# Patient Record
Sex: Female | Born: 1950 | Race: White | Hispanic: No | Marital: Married | Smoking: Never smoker
Health system: Southern US, Community
[De-identification: ages and names within clinical notes are randomized; demographics above are authoritative.]

## PROBLEM LIST (undated history)

## (undated) DIAGNOSIS — K219 Gastro-esophageal reflux disease without esophagitis: Secondary | ICD-10-CM

## (undated) DIAGNOSIS — D126 Benign neoplasm of colon, unspecified: Secondary | ICD-10-CM

## (undated) DIAGNOSIS — M6530 Trigger finger, unspecified finger: Secondary | ICD-10-CM

## (undated) DIAGNOSIS — C801 Malignant (primary) neoplasm, unspecified: Secondary | ICD-10-CM

## (undated) DIAGNOSIS — M1990 Unspecified osteoarthritis, unspecified site: Secondary | ICD-10-CM

## (undated) DIAGNOSIS — Z9221 Personal history of antineoplastic chemotherapy: Secondary | ICD-10-CM

## (undated) DIAGNOSIS — Z923 Personal history of irradiation: Secondary | ICD-10-CM

## (undated) DIAGNOSIS — I10 Essential (primary) hypertension: Secondary | ICD-10-CM

## (undated) DIAGNOSIS — C55 Malignant neoplasm of uterus, part unspecified: Secondary | ICD-10-CM

## (undated) DIAGNOSIS — R42 Dizziness and giddiness: Secondary | ICD-10-CM

## (undated) DIAGNOSIS — D649 Anemia, unspecified: Secondary | ICD-10-CM

## (undated) DIAGNOSIS — Z1379 Encounter for other screening for genetic and chromosomal anomalies: Secondary | ICD-10-CM

## (undated) DIAGNOSIS — C50919 Malignant neoplasm of unspecified site of unspecified female breast: Secondary | ICD-10-CM

## (undated) DIAGNOSIS — N3281 Overactive bladder: Secondary | ICD-10-CM

## (undated) DIAGNOSIS — E119 Type 2 diabetes mellitus without complications: Secondary | ICD-10-CM

## (undated) DIAGNOSIS — Z8739 Personal history of other diseases of the musculoskeletal system and connective tissue: Secondary | ICD-10-CM

## (undated) DIAGNOSIS — G4730 Sleep apnea, unspecified: Secondary | ICD-10-CM

## (undated) DIAGNOSIS — Z8719 Personal history of other diseases of the digestive system: Secondary | ICD-10-CM

## (undated) HISTORY — DX: Encounter for other screening for genetic and chromosomal anomalies: Z13.79

## (undated) HISTORY — PX: HERNIA REPAIR: SHX51

## (undated) HISTORY — PX: PORT-A-CATH REMOVAL: SHX5289

## (undated) HISTORY — DX: Malignant neoplasm of unspecified site of unspecified female breast: C50.919

## (undated) HISTORY — DX: Malignant neoplasm of uterus, part unspecified: C55

## (undated) HISTORY — PX: ABDOMINAL HYSTERECTOMY: SHX81

---

## 2004-07-20 ENCOUNTER — Other Ambulatory Visit: Payer: Self-pay

## 2004-07-22 ENCOUNTER — Ambulatory Visit: Payer: Self-pay | Admitting: Obstetrics and Gynecology

## 2004-08-09 ENCOUNTER — Ambulatory Visit: Payer: Self-pay | Admitting: Obstetrics and Gynecology

## 2004-08-26 ENCOUNTER — Inpatient Hospital Stay: Payer: Self-pay | Admitting: Obstetrics and Gynecology

## 2004-09-14 ENCOUNTER — Ambulatory Visit: Payer: Self-pay | Admitting: Gynecologic Oncology

## 2004-09-26 ENCOUNTER — Ambulatory Visit: Payer: Self-pay | Admitting: Family Medicine

## 2004-10-09 ENCOUNTER — Ambulatory Visit: Payer: Self-pay | Admitting: Gynecologic Oncology

## 2004-11-09 ENCOUNTER — Ambulatory Visit: Payer: Self-pay | Admitting: Gynecologic Oncology

## 2004-12-07 ENCOUNTER — Ambulatory Visit: Payer: Self-pay | Admitting: Gynecologic Oncology

## 2005-03-02 ENCOUNTER — Ambulatory Visit: Payer: Self-pay | Admitting: Gastroenterology

## 2005-11-20 ENCOUNTER — Ambulatory Visit: Payer: Self-pay | Admitting: Internal Medicine

## 2005-12-07 ENCOUNTER — Ambulatory Visit: Payer: Self-pay | Admitting: Radiation Oncology

## 2006-06-10 ENCOUNTER — Ambulatory Visit: Payer: Self-pay | Admitting: Internal Medicine

## 2006-09-27 ENCOUNTER — Ambulatory Visit: Payer: Self-pay | Admitting: Obstetrics and Gynecology

## 2006-11-21 ENCOUNTER — Ambulatory Visit: Payer: Self-pay | Admitting: Obstetrics and Gynecology

## 2006-12-13 ENCOUNTER — Ambulatory Visit: Payer: Self-pay | Admitting: Radiation Oncology

## 2007-01-08 ENCOUNTER — Ambulatory Visit: Payer: Self-pay | Admitting: Radiation Oncology

## 2007-05-31 ENCOUNTER — Ambulatory Visit: Payer: Self-pay | Admitting: Surgery

## 2007-05-31 ENCOUNTER — Other Ambulatory Visit: Payer: Self-pay

## 2007-06-04 ENCOUNTER — Inpatient Hospital Stay: Payer: Self-pay | Admitting: Surgery

## 2007-12-08 ENCOUNTER — Ambulatory Visit: Payer: Self-pay | Admitting: Radiation Oncology

## 2007-12-19 ENCOUNTER — Ambulatory Visit: Payer: Self-pay | Admitting: Radiation Oncology

## 2007-12-24 ENCOUNTER — Ambulatory Visit: Payer: Self-pay | Admitting: Obstetrics and Gynecology

## 2008-01-08 ENCOUNTER — Ambulatory Visit: Payer: Self-pay | Admitting: Radiation Oncology

## 2008-12-07 ENCOUNTER — Ambulatory Visit: Payer: Self-pay | Admitting: Radiation Oncology

## 2008-12-17 ENCOUNTER — Ambulatory Visit: Payer: Self-pay | Admitting: Radiation Oncology

## 2008-12-29 ENCOUNTER — Ambulatory Visit: Payer: Self-pay | Admitting: Obstetrics and Gynecology

## 2009-01-07 ENCOUNTER — Ambulatory Visit: Payer: Self-pay | Admitting: Radiation Oncology

## 2010-02-02 ENCOUNTER — Ambulatory Visit: Payer: Self-pay | Admitting: Obstetrics and Gynecology

## 2010-04-19 ENCOUNTER — Ambulatory Visit: Payer: Self-pay | Admitting: Gastroenterology

## 2011-02-23 ENCOUNTER — Ambulatory Visit: Payer: Self-pay | Admitting: Obstetrics and Gynecology

## 2012-03-06 ENCOUNTER — Ambulatory Visit: Payer: Self-pay | Admitting: Obstetrics and Gynecology

## 2012-03-14 ENCOUNTER — Ambulatory Visit: Payer: Self-pay | Admitting: Obstetrics and Gynecology

## 2012-04-02 ENCOUNTER — Ambulatory Visit: Payer: Self-pay | Admitting: Surgery

## 2012-10-14 ENCOUNTER — Ambulatory Visit: Payer: Self-pay | Admitting: Surgery

## 2013-03-10 ENCOUNTER — Ambulatory Visit: Payer: Self-pay | Admitting: Surgery

## 2013-09-24 ENCOUNTER — Ambulatory Visit: Payer: Self-pay | Admitting: Surgery

## 2013-10-09 HISTORY — PX: BREAST EXCISIONAL BIOPSY: SUR124

## 2014-02-11 DIAGNOSIS — J309 Allergic rhinitis, unspecified: Secondary | ICD-10-CM | POA: Insufficient documentation

## 2014-03-17 ENCOUNTER — Ambulatory Visit: Payer: Self-pay | Admitting: Surgery

## 2014-04-02 ENCOUNTER — Ambulatory Visit: Payer: Self-pay | Admitting: Surgery

## 2014-04-08 HISTORY — PX: BREAST BIOPSY: SHX20

## 2014-04-09 ENCOUNTER — Ambulatory Visit: Payer: Self-pay | Admitting: Surgery

## 2014-04-14 LAB — PATHOLOGY REPORT

## 2014-05-25 DIAGNOSIS — I10 Essential (primary) hypertension: Secondary | ICD-10-CM | POA: Insufficient documentation

## 2014-10-14 ENCOUNTER — Ambulatory Visit: Payer: Self-pay | Admitting: Surgery

## 2015-01-30 NOTE — Op Note (Signed)
PATIENT NAME:  Dominique Guzman, Dominique Guzman MR#:  [id number] DATE OF BIRTH:  December 03, 1950  DATE OF PROCEDURE:  04/09/2014  PREOPERATIVE DIAGNOSIS: Left breast mass.   POSTOPERATIVE DIAGNOSIS: Left breast mass.   PROCEDURE: Excision of left breast mass.   SURGEON: Rochel Brome, M.D.   ANESTHESIA: General.   INDICATIONS: This 64 year old female has had a mammogram depicting a faint irregular mass deep within this upper central aspect of the left breast, was not seen on ultrasound. We elected to do Kopans wire localization and excision. She did have the insertion of Kopans wire this morning. I reviewed mammogram images depicting the location of the wire with the barb inserted into the mass.   PROCEDURE IN DETAIL: The patient was placed on the operating table in the supine position under general anesthesia. The left arm was placed on a left lateral arm support. The dressing was removed from the upper aspect of the left breast exposing the Kopans wire, which was cut 2 cm from the skin. The breast was prepared with ChloraPrep and draped in a sterile manner.   An incision was made midway between the entrance point of the wire and the nipple, and the incision was transversely oriented curvilinear extending from approximately 11:00 to 1:00 position, and carried down through subcutaneous tissues down deep within the breast and encountered the wire. The location of the thickener was identified and dissection was carried down deeply within the left breast, down as far as the deep fascia, which was approximately the 12:00 position of the breast and also mostly centrally located. There was no grossly palpable hard mass in the area, but it appeared that this was soft tissue that had the fatty appearance. A minimal amount of glandular tissue involved, and this portion of tissue surrounding the wire from the mid aspect of the thickener down beyond the barb was excised, removing a portion of tissue, which was approximately 1.5  x 1.5 x 4 cm in length, and submitted for specimen mammogram and routine pathology. The wound was palpated.  There was no visible or palpable mass within the wound in the surrounding area. Several small bleeding points were cauterized. Hemostasis was intact. Next, the wound was closed with a running 4-0 Monocryl subcuticular suture and Dermabond. The patient was carried to the Operating Room for postoperative care.     ____________________________ J. Rochel Brome, MD jws:ts D: 04/09/2014 12:29:45 ET T: 04/09/2014 18:57:00 ET JOB#: [id number]  cc: Loreli Dollar, MD, <Dictator> Loreli Dollar MD ELECTRONICALLY SIGNED 04/20/2014 13:01

## 2015-03-30 ENCOUNTER — Other Ambulatory Visit: Payer: Self-pay | Admitting: Obstetrics and Gynecology

## 2015-03-30 DIAGNOSIS — Z1231 Encounter for screening mammogram for malignant neoplasm of breast: Secondary | ICD-10-CM

## 2015-04-05 ENCOUNTER — Ambulatory Visit
Admission: RE | Admit: 2015-04-05 | Discharge: 2015-04-05 | Disposition: A | Discharge status: Home / Self Care | Payer: BLUE CROSS/BLUE SHIELD | Source: Ambulatory Visit | Attending: Obstetrics and Gynecology | Admitting: Obstetrics and Gynecology

## 2015-04-05 DIAGNOSIS — Z1231 Encounter for screening mammogram for malignant neoplasm of breast: Secondary | ICD-10-CM | POA: Insufficient documentation

## 2015-04-14 ENCOUNTER — Encounter: Payer: Self-pay | Admitting: *Deleted

## 2015-04-15 ENCOUNTER — Encounter
Admission: RE | Disposition: A | Discharge status: Home / Self Care | Payer: Self-pay | Source: Ambulatory Visit | Attending: Gastroenterology

## 2015-04-15 ENCOUNTER — Ambulatory Visit: Payer: BLUE CROSS/BLUE SHIELD | Admitting: Anesthesiology

## 2015-04-15 ENCOUNTER — Ambulatory Visit
Admission: RE | Admit: 2015-04-15 | Discharge: 2015-04-15 | Disposition: A | Discharge status: Home / Self Care | Payer: BLUE CROSS/BLUE SHIELD | Source: Ambulatory Visit | Attending: Gastroenterology | Admitting: Gastroenterology

## 2015-04-15 DIAGNOSIS — G4730 Sleep apnea, unspecified: Secondary | ICD-10-CM | POA: Diagnosis not present

## 2015-04-15 DIAGNOSIS — Z1211 Encounter for screening for malignant neoplasm of colon: Secondary | ICD-10-CM | POA: Insufficient documentation

## 2015-04-15 DIAGNOSIS — E119 Type 2 diabetes mellitus without complications: Secondary | ICD-10-CM | POA: Insufficient documentation

## 2015-04-15 DIAGNOSIS — I252 Old myocardial infarction: Secondary | ICD-10-CM | POA: Insufficient documentation

## 2015-04-15 DIAGNOSIS — Z79899 Other long term (current) drug therapy: Secondary | ICD-10-CM | POA: Diagnosis not present

## 2015-04-15 DIAGNOSIS — I10 Essential (primary) hypertension: Secondary | ICD-10-CM | POA: Insufficient documentation

## 2015-04-15 DIAGNOSIS — Z86010 Personal history of colonic polyps: Secondary | ICD-10-CM | POA: Diagnosis not present

## 2015-04-15 HISTORY — DX: Essential (primary) hypertension: I10

## 2015-04-15 HISTORY — DX: Malignant (primary) neoplasm, unspecified: C80.1

## 2015-04-15 HISTORY — DX: Sleep apnea, unspecified: G47.30

## 2015-04-15 HISTORY — DX: Personal history of other diseases of the musculoskeletal system and connective tissue: Z87.39

## 2015-04-15 HISTORY — DX: Benign neoplasm of colon, unspecified: D12.6

## 2015-04-15 HISTORY — DX: Type 2 diabetes mellitus without complications: E11.9

## 2015-04-15 HISTORY — DX: Anemia, unspecified: D64.9

## 2015-04-15 HISTORY — PX: COLONOSCOPY WITH PROPOFOL: SHX5780

## 2015-04-15 LAB — GLUCOSE, CAPILLARY: Glucose-Capillary: 157 mg/dL — ABNORMAL HIGH (ref 65–99)

## 2015-04-15 SURGERY — COLONOSCOPY WITH PROPOFOL
Anesthesia: General

## 2015-04-15 MED ORDER — PROPOFOL INFUSION 10 MG/ML OPTIME
INTRAVENOUS | Status: DC | PRN
  Administered 2015-04-15: 120 ug/kg/min via INTRAVENOUS

## 2015-04-15 MED ORDER — LIDOCAINE HCL (CARDIAC) 20 MG/ML IV SOLN
INTRAVENOUS | Status: DC | PRN
  Administered 2015-04-15: 60 mg via INTRAVENOUS

## 2015-04-15 MED ORDER — SODIUM CHLORIDE 0.9 % IV SOLN
INTRAVENOUS | Status: DC
  Administered 2015-04-15: 11:00:00 via INTRAVENOUS
  Administered 2015-04-15: 1000 mL via INTRAVENOUS

## 2015-04-15 MED ORDER — PROPOFOL 10 MG/ML IV BOLUS
INTRAVENOUS | Status: DC | PRN
  Administered 2015-04-15: 40 mg via INTRAVENOUS

## 2015-04-15 NOTE — Anesthesia Postprocedure Evaluation (Signed)
  Anesthesia Post-op Note  Patient: Dominique Guzman  Procedure(s) Performed: Procedure(s): COLONOSCOPY WITH PROPOFOL (N/A)  Anesthesia type:General  Patient location: PACU  Post pain: Pain level controlled  Post assessment: Post-op Vital signs reviewed, Patient's Cardiovascular Status Stable, Respiratory Function Stable, Patent Airway and No signs of Nausea or vomiting  Post vital signs: Reviewed and stable  Last Vitals:  Filed Vitals:   04/15/15 1130  BP: 118/69  Pulse: 74  Temp:   Resp: 24    Level of consciousness: awake, alert  and patient cooperative  Complications: No apparent anesthesia complications

## 2015-04-15 NOTE — Op Note (Signed)
[facility] Gastroenterology Patient Name: Dominique Guzman Procedure Date: 04/15/2015 10:57 AM MRN: [id number] Account #: [id number] Date of Birth: [birth year] Admit Type: Outpatient Age: 64 Room: [facility] ENDO ROOM 4 Gender: Female Note Status: Finalized Procedure:         Colonoscopy Indications:       Personal history of colonic polyps Providers:         Lupita Dawn. Candace Cruise, MD Referring MD:      Glendon Axe (Referring MD) Medicines:         Monitored Anesthesia Care Complications:     No immediate complications. Procedure:         Pre-Anesthesia Assessment:                    - Prior to the procedure, a History and Physical was                     performed, and patient medications, allergies and                     sensitivities were reviewed. The patient's tolerance of                     previous anesthesia was reviewed.                    - The risks and benefits of the procedure and the sedation                     options and risks were discussed with the patient. All                     questions were answered and informed consent was obtained.                    - After reviewing the risks and benefits, the patient was                     deemed in satisfactory condition to undergo the procedure.                    After obtaining informed consent, the colonoscope was                     passed under direct vision. Throughout the procedure, the                     patient's blood pressure, pulse, and oxygen saturations                     were monitored continuously. The Colonoscope was                     introduced through the anus and advanced to the the cecum,                     identified by appendiceal orifice and ileocecal valve. The                     colonoscopy was performed without difficulty. The patient                     tolerated the procedure well. The quality of the bowel  preparation was good. Findings:      The colon  (entire examined portion) appeared normal. Impression:        - The entire examined colon is normal.                    - No specimens collected. Recommendation:    - Discharge patient to home.                    - Repeat colonoscopy in 5 years for surveillance.                    - The findings and recommendations were discussed with the                     patient. Procedure Code(s): --- Professional ---                    [phone], Colonoscopy, flexible; diagnostic, including                     collection of specimen(s) by brushing or washing, when                     performed (separate procedure) Diagnosis Code(s): --- Professional ---                    Z86.010, Personal history of colonic polyps CPT copyright 2014 [employer]. All rights reserved. The codes documented in this report are preliminary and upon coder review may  be revised to meet current compliance requirements. Hulen Luster, MD 04/15/2015 11:13:27 AM This report has been signed electronically. Number of Addenda: 0 Note Initiated On: 04/15/2015 10:57 AM Scope Withdrawal Time: 0 hours 5 minutes 43 seconds  Total Procedure Duration: 0 hours 9 minutes 58 seconds       [facility]

## 2015-04-15 NOTE — Transfer of Care (Signed)
Immediate Anesthesia Transfer of Care Note  Patient: Dominique Guzman  Procedure(s) Performed: Procedure(s): COLONOSCOPY WITH PROPOFOL (N/A)  Patient Location: Endoscopy Unit  Anesthesia Type:General  Level of Consciousness: awake, alert , oriented and patient cooperative  Airway & Oxygen Therapy: Patient Spontanous Breathing and Patient connected to nasal cannula oxygen  Post-op Assessment: Report given to RN, Post -op Vital signs reviewed and stable and Patient moving all extremities X 4  Post vital signs: Reviewed and stable  Last Vitals:  Filed Vitals:   04/15/15 1116  BP: 119/70  Pulse: 73  Temp: 36.6 C  Resp: 21    Complications: No apparent anesthesia complications

## 2015-04-15 NOTE — H&P (Signed)
    Primary Care Physician:  Glendon Axe, MD Primary Gastroenterologist:  Dr. Candace Cruise  Pre-Procedure History & Physical: HPI:  Dominique Guzman is a 64 y.o. female is here for an colonoscopy.   Past Medical History  Diagnosis Date  . Diabetes mellitus without complication   . Hypertension   . Sleep apnea   . Anemia   . Benign neoplasm of colon   . H/O osteopenia   . Cancer     Past Surgical History  Procedure Laterality Date  . Breast biopsy Left 04/2014    negative  . Abdominal hysterectomy    . Cesarean section      Prior to Admission medications   Medication Sig Start Date End Date Taking? Authorizing Provider  Cinnamon Bark POWD 1 Dose by Does not apply route 1 day or 1 dose.   Yes Historical Provider, MD  FIBER, CORN DEXTRIN, PO Take 5 mg/day by mouth 1 day or 1 dose.   Yes Historical Provider, MD  fluticasone (FLONASE) 50 MCG/ACT nasal spray Place 2 sprays into both nostrils daily.   Yes Historical Provider, MD  glimepiride (AMARYL) 4 MG tablet Take 4 mg by mouth 2 (two) times daily.   Yes Historical Provider, MD  Glucosamine Sulfate 500 MG TABS Take 1,500 mg by mouth 1 day or 1 dose.   Yes Historical Provider, MD  lisinopril-hydrochlorothiazide (PRINZIDE,ZESTORETIC) 20-12.5 MG per tablet Take 1 tablet by mouth daily.   Yes Historical Provider, MD  meloxicam (MOBIC) 15 MG tablet Take 15 mg by mouth daily.   Yes Historical Provider, MD  metFORMIN (GLUCOPHAGE) 500 MG tablet Take by mouth 2 (two) times daily with a meal.   Yes Historical Provider, MD  Multiple Vitamin (MULTIVITAMIN) tablet Take 1 tablet by mouth daily.   Yes Historical Provider, MD  sitaGLIPtin (JANUVIA) 100 MG tablet Take 100 mg by mouth daily.   Yes Historical Provider, MD    Allergies as of 03/30/2015  . (Not on File)    History reviewed. No pertinent family history.  History   Social History  . Marital Status: Married    Spouse Name: N/A  . Number of Children: N/A  . Years of Education: N/A    Occupational History  . Not on file.   Social History Main Topics  . Smoking status: Never Smoker   . Smokeless tobacco: Not on file  . Alcohol Use: Not on file  . Drug Use: No  . Sexual Activity: Not on file   Other Topics Concern  . Not on file   Social History Narrative    Review of Systems: See HPI, otherwise negative ROS  Physical Exam: LMP  (LMP Unknown) General:   Alert,  pleasant and cooperative in NAD Head:  Normocephalic and atraumatic. Neck:  Supple; no masses or thyromegaly. Lungs:  Clear throughout to auscultation.    Heart:  Regular rate and rhythm. Abdomen:  Soft, nontender and nondistended. Normal bowel sounds, without guarding, and without rebound.   Neurologic:  Alert and  oriented x4;  grossly normal neurologically.  Impression/Plan: Dominique Guzman is here for an colonoscopy to be performed for personal hx of colon polyps  Risks, benefits, limitations, and alternatives regarding colonoscopy have been reviewed with the patient.  Questions have been answered.  All parties agreeable.   Laura Radilla, Lupita Dawn, MD  04/15/2015, 9:42 AM

## 2015-04-15 NOTE — Anesthesia Preprocedure Evaluation (Signed)
Anesthesia Evaluation  Patient identified by MRN, date of birth, ID band Patient awake    Reviewed: Allergy & Precautions, H&P , NPO status , Patient's Chart, lab work & pertinent test results, reviewed documented beta blocker date and time   Airway Mallampati: III  TM Distance: >3 FB Neck ROM: full    Dental   Pulmonary sleep apnea ,  breath sounds clear to auscultation  Pulmonary exam normal       Cardiovascular hypertension, - Past MI negative cardio ROS Normal cardiovascular examRhythm:regular Rate:Normal     Neuro/Psych negative neurological ROS  negative psych ROS   GI/Hepatic negative GI ROS, Neg liver ROS,   Endo/Other  negative endocrine ROSdiabetes  Renal/GU negative Renal ROS  negative genitourinary   Musculoskeletal   Abdominal   Peds  Hematology negative hematology ROS (+)   Anesthesia Other Findings   Reproductive/Obstetrics negative OB ROS                             Anesthesia Physical Anesthesia Plan  ASA: III  Anesthesia Plan: General   Post-op Pain Management:    Induction:   Airway Management Planned:   Additional Equipment:   Intra-op Plan:   Post-operative Plan:   Informed Consent: I have reviewed the patients History and Physical, chart, labs and discussed the procedure including the risks, benefits and alternatives for the proposed anesthesia with the patient or authorized representative who has indicated his/her understanding and acceptance.   Dental Advisory Given  Plan Discussed with: Anesthesiologist, CRNA and Surgeon  Anesthesia Plan Comments:         Anesthesia Quick Evaluation

## 2015-04-16 ENCOUNTER — Encounter: Payer: Self-pay | Admitting: Gastroenterology

## 2015-05-19 DIAGNOSIS — E119 Type 2 diabetes mellitus without complications: Secondary | ICD-10-CM | POA: Insufficient documentation

## 2015-11-18 DIAGNOSIS — I10 Essential (primary) hypertension: Secondary | ICD-10-CM | POA: Insufficient documentation

## 2015-11-18 DIAGNOSIS — E119 Type 2 diabetes mellitus without complications: Secondary | ICD-10-CM | POA: Insufficient documentation

## 2015-11-18 DIAGNOSIS — M545 Low back pain, unspecified: Secondary | ICD-10-CM | POA: Insufficient documentation

## 2015-11-18 DIAGNOSIS — G8929 Other chronic pain: Secondary | ICD-10-CM | POA: Insufficient documentation

## 2016-03-30 ENCOUNTER — Other Ambulatory Visit: Payer: Self-pay | Admitting: Obstetrics and Gynecology

## 2016-03-30 DIAGNOSIS — Z1231 Encounter for screening mammogram for malignant neoplasm of breast: Secondary | ICD-10-CM

## 2016-04-13 ENCOUNTER — Other Ambulatory Visit: Payer: Self-pay | Admitting: Obstetrics and Gynecology

## 2016-04-13 ENCOUNTER — Ambulatory Visit
Admission: RE | Admit: 2016-04-13 | Discharge: 2016-04-13 | Disposition: A | Discharge status: Home / Self Care | Payer: BLUE CROSS/BLUE SHIELD | Source: Ambulatory Visit | Attending: Obstetrics and Gynecology | Admitting: Obstetrics and Gynecology

## 2016-04-13 DIAGNOSIS — Z1231 Encounter for screening mammogram for malignant neoplasm of breast: Secondary | ICD-10-CM

## 2016-10-09 DIAGNOSIS — C50919 Malignant neoplasm of unspecified site of unspecified female breast: Secondary | ICD-10-CM

## 2016-10-09 HISTORY — PX: MASTECTOMY: SHX3

## 2016-10-09 HISTORY — DX: Malignant neoplasm of unspecified site of unspecified female breast: C50.919

## 2017-04-04 ENCOUNTER — Other Ambulatory Visit: Payer: Self-pay | Admitting: Obstetrics and Gynecology

## 2017-04-04 DIAGNOSIS — Z1231 Encounter for screening mammogram for malignant neoplasm of breast: Secondary | ICD-10-CM

## 2017-04-04 DIAGNOSIS — Z8542 Personal history of malignant neoplasm of other parts of uterus: Secondary | ICD-10-CM | POA: Insufficient documentation

## 2017-04-17 ENCOUNTER — Ambulatory Visit
Admission: RE | Admit: 2017-04-17 | Discharge: 2017-04-17 | Disposition: A | Discharge status: Home / Self Care | Payer: BLUE CROSS/BLUE SHIELD | Source: Ambulatory Visit | Attending: Obstetrics and Gynecology | Admitting: Obstetrics and Gynecology

## 2017-04-17 DIAGNOSIS — Z1231 Encounter for screening mammogram for malignant neoplasm of breast: Secondary | ICD-10-CM | POA: Insufficient documentation

## 2017-04-17 DIAGNOSIS — R928 Other abnormal and inconclusive findings on diagnostic imaging of breast: Secondary | ICD-10-CM | POA: Diagnosis not present

## 2017-04-20 ENCOUNTER — Other Ambulatory Visit: Payer: Self-pay | Admitting: Obstetrics and Gynecology

## 2017-04-20 DIAGNOSIS — R928 Other abnormal and inconclusive findings on diagnostic imaging of breast: Secondary | ICD-10-CM

## 2017-04-20 DIAGNOSIS — N6320 Unspecified lump in the left breast, unspecified quadrant: Secondary | ICD-10-CM

## 2017-04-25 ENCOUNTER — Other Ambulatory Visit: Payer: Self-pay | Admitting: Obstetrics and Gynecology

## 2017-04-25 ENCOUNTER — Ambulatory Visit
Admission: RE | Admit: 2017-04-25 | Discharge: 2017-04-25 | Disposition: A | Discharge status: Home / Self Care | Payer: BLUE CROSS/BLUE SHIELD | Source: Ambulatory Visit | Attending: Obstetrics and Gynecology | Admitting: Obstetrics and Gynecology

## 2017-04-25 DIAGNOSIS — C773 Secondary and unspecified malignant neoplasm of axilla and upper limb lymph nodes: Secondary | ICD-10-CM | POA: Diagnosis not present

## 2017-04-25 DIAGNOSIS — C50412 Malignant neoplasm of upper-outer quadrant of left female breast: Secondary | ICD-10-CM | POA: Insufficient documentation

## 2017-04-25 DIAGNOSIS — N6320 Unspecified lump in the left breast, unspecified quadrant: Secondary | ICD-10-CM

## 2017-04-25 DIAGNOSIS — R928 Other abnormal and inconclusive findings on diagnostic imaging of breast: Secondary | ICD-10-CM

## 2017-04-25 DIAGNOSIS — N6323 Unspecified lump in the left breast, lower outer quadrant: Secondary | ICD-10-CM | POA: Diagnosis present

## 2017-04-30 ENCOUNTER — Encounter: Payer: Self-pay | Admitting: *Deleted

## 2017-04-30 NOTE — Progress Notes (Signed)
  Oncology Nurse Navigator Documentation  Navigator Location: [facility] (04/30/17 1100)   )Navigator Encounter Type: Telephone (04/30/17 1100) Telephone: Outgoing Call (04/30/17 1100) Abnormal Finding Date: 04/25/17 (04/30/17 1100) Confirmed Diagnosis Date: 04/27/17 (04/30/17 1100)                   Barriers/Navigation Needs: Coordination of Care (04/30/17 1100)                              Called and left patient a message to return my call.  She is newly diagnosed with invasive breast and needs to be scheduled for surgical and medical oncology consultation.

## 2017-05-01 ENCOUNTER — Encounter: Payer: Self-pay | Admitting: *Deleted

## 2017-05-01 NOTE — Progress Notes (Signed)
  Oncology Nurse Navigator Documentation  Navigator Location: [facility] (05/01/17 1100)   )Navigator Encounter Type: Introductory phone call (05/01/17 1100) Telephone: [place] Call (05/01/17 1100)                       Barriers/Navigation Needs: Coordination of Care (05/01/17 1100)   Interventions: Coordination of Care (05/01/17 1100)   Coordination of Care: Appts (05/01/17 1100)                  Time Spent with Patient: 30 (05/01/17 1100)   Talked with patient today.  Introduced to navigation services.  States she has already been scheduled to see Dr. Tamala Julian on 05/08/17 @ 3:30.  Discussed need for medical oncology consult also.  States she wants to see Dr. Baruch Gouty, since she has seen in the past.  States she has a history of uterine cancer diagnosed in 2005.  States she had a total hysterectomy and radiation therapy at that time. Explained she would see him at a later date, since he is radiation oncology.  Appointment scheduled to see Dr. Tasia Catchings on Friday at 8:30.  Will give her her educational literature at that time.  She is to call with any questions or needs.

## 2017-05-04 ENCOUNTER — Encounter: Payer: Self-pay | Admitting: *Deleted

## 2017-05-04 ENCOUNTER — Inpatient Hospital Stay: Payer: BLUE CROSS/BLUE SHIELD

## 2017-05-04 ENCOUNTER — Inpatient Hospital Stay: Payer: BLUE CROSS/BLUE SHIELD | Attending: Oncology | Admitting: Oncology

## 2017-05-04 ENCOUNTER — Encounter: Payer: Self-pay | Admitting: Oncology

## 2017-05-04 VITALS — BP 137/87 | HR 80 | Temp 97.9°F | Wt 189.3 lb

## 2017-05-04 DIAGNOSIS — M8580 Other specified disorders of bone density and structure, unspecified site: Secondary | ICD-10-CM | POA: Insufficient documentation

## 2017-05-04 DIAGNOSIS — I10 Essential (primary) hypertension: Secondary | ICD-10-CM | POA: Insufficient documentation

## 2017-05-04 DIAGNOSIS — R590 Localized enlarged lymph nodes: Secondary | ICD-10-CM

## 2017-05-04 DIAGNOSIS — Z923 Personal history of irradiation: Secondary | ICD-10-CM | POA: Diagnosis not present

## 2017-05-04 DIAGNOSIS — C50512 Malignant neoplasm of lower-outer quadrant of left female breast: Secondary | ICD-10-CM | POA: Insufficient documentation

## 2017-05-04 DIAGNOSIS — C50412 Malignant neoplasm of upper-outer quadrant of left female breast: Secondary | ICD-10-CM

## 2017-05-04 DIAGNOSIS — Z8542 Personal history of malignant neoplasm of other parts of uterus: Secondary | ICD-10-CM | POA: Insufficient documentation

## 2017-05-04 DIAGNOSIS — E119 Type 2 diabetes mellitus without complications: Secondary | ICD-10-CM | POA: Diagnosis not present

## 2017-05-04 DIAGNOSIS — Z7984 Long term (current) use of oral hypoglycemic drugs: Secondary | ICD-10-CM

## 2017-05-04 DIAGNOSIS — Z170 Estrogen receptor positive status [ER+]: Secondary | ICD-10-CM | POA: Insufficient documentation

## 2017-05-04 DIAGNOSIS — Z79899 Other long term (current) drug therapy: Secondary | ICD-10-CM | POA: Insufficient documentation

## 2017-05-04 DIAGNOSIS — G4730 Sleep apnea, unspecified: Secondary | ICD-10-CM | POA: Diagnosis not present

## 2017-05-04 LAB — COMPREHENSIVE METABOLIC PANEL
ALBUMIN: 4 g/dL (ref 3.5–5.0)
ALT: 12 U/L — AB (ref 14–54)
AST: 20 U/L (ref 15–41)
Alkaline Phosphatase: 84 U/L (ref 38–126)
Anion gap: 9 (ref 5–15)
BUN: 19 mg/dL (ref 6–20)
CHLORIDE: 101 mmol/L (ref 101–111)
CO2: 27 mmol/L (ref 22–32)
CREATININE: 0.85 mg/dL (ref 0.44–1.00)
Calcium: 10 mg/dL (ref 8.9–10.3)
GFR calc Af Amer: >60 mL/min (ref >60)
Glucose, Bld: 132 mg/dL — ABNORMAL HIGH (ref 65–99)
POTASSIUM: 4.2 mmol/L (ref 3.5–5.1)
SODIUM: 137 mmol/L (ref 135–145)
Total Bilirubin: 0.5 mg/dL (ref 0.3–1.2)
Total Protein: 7.2 g/dL (ref 6.5–8.1)

## 2017-05-04 LAB — CBC WITH DIFFERENTIAL/PLATELET
BASOS ABS: 0 10*3/uL (ref 0–0.1)
BASOS PCT: 1 %
EOS ABS: 0.4 10*3/uL (ref 0–0.7)
EOS PCT: 4 %
HCT: 38.5 % (ref 35.0–47.0)
Hemoglobin: 13.6 g/dL (ref 12.0–16.0)
LYMPHS PCT: 17 %
Lymphs Abs: 1.7 10*3/uL (ref 1.0–3.6)
MCH: 28.4 pg (ref 26.0–34.0)
MCHC: 35.3 g/dL (ref 32.0–36.0)
MCV: 80.4 fL (ref 80.0–100.0)
MONO ABS: 0.8 10*3/uL (ref 0.2–0.9)
Monocytes Relative: 9 %
Neutro Abs: 6.8 10*3/uL — ABNORMAL HIGH (ref 1.4–6.5)
Neutrophils Relative %: 69 %
PLATELETS: 365 10*3/uL (ref 150–440)
RBC: 4.79 MIL/uL (ref 3.80–5.20)
RDW: 13.7 % (ref 11.5–14.5)
WBC: 9.7 10*3/uL (ref 3.6–11.0)

## 2017-05-04 NOTE — Progress Notes (Signed)
  Oncology Nurse Navigator Documentation  Navigator Location: [facility] (05/04/17 0900)   )Navigator Encounter Type: Initial MedOnc (05/04/17 0900)                     Patient Visit Type: MedOnc (05/04/17 0900) Treatment Phase: Pre-Tx/Tx Discussion (05/04/17 0900) Barriers/Navigation Needs: Education (05/04/17 0900) Education: Understanding Cancer/ Treatment Options;Coping with Diagnosis/ Prognosis;Newly Diagnosed Cancer Education (05/04/17 0900) Interventions: Education (05/04/17 0900)     Education Method: Written;Verbal (05/04/17 0900)  Support Groups/Services: [facility];Breast Support Group (05/04/17 0900)             Time Spent with Patient: 60 (05/04/17 0900)   Met with patient and her husband today during her initial medical oncology consult with Dr. Tasia Catchings.  Gave patient breast cancer educational literature, "My Breast Cancer Treatment Handbook" by Josephine Igo, RN.  Patient has been scheduled for an MRI, Echo and port a cath placement with Dr. Faythe Casa.  She is to call with any questions or needs.

## 2017-05-04 NOTE — Progress Notes (Signed)
[facility] Consultation Note  Patient Care Team: Glendon Axe, MD as PCP - General (Internal Medicine)  CHIEF COMPLAINTS/PURPOSE OF CONSULTATION: History of Uterine Cancer, treated with radiation.   HISTORY OF PRESENTING ILLNESS: Dominique Guzman 66 y.o. female with PMH listed as below is referred by Dr.Schermerhorn to [name] for evaluation and management of newly diagnosed breast cancer. Patient had a history a left breast mass biopsy followed by breast excisonal biopsy in 2015 with benign pathology.  She also has a history of Uterine cancer which was treated with RT with Dr.Chrystal.  She underwent routine screening mammogram on 04/17/2017 which recommended a possible mass on the left breast. Left diagnostic mammogram showed a highly suspicious left breast mass at 4 o'clock, 7 cm from the nipple and 1 o'clock,,  About 3 cm from the nipple. Enlarged left axillary lymph node suspicious for metastatic disease.  US breast confirmed a left breast mass of 2.1cm, 4 o'clock, and 1.1 cm mass at 1 o'clock.The masses at 1 and 4 o'clock are separated by approximately 4 cm sonographically Biopsy of the two mass and axillary lymph node revealed invasive carcinoma, grade 2. Both are ER,PR positive. HER2 equivocal, FISH pending. 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   Patient denies nipple discharge, pain, back pain or cough.   Review of Systems  Constitutional: Negative.   HENT:  Negative.   Eyes: Negative.   Respiratory: Negative.   Cardiovascular: Negative.   Gastrointestinal: Negative.   Endocrine: Negative.   Genitourinary: Negative.    Musculoskeletal: Negative.   Skin: Negative.     MEDICAL HISTORY: Past Medical History:  Diagnosis Date  . Anemia   . Benign neoplasm of colon   . Cancer ([place])   . Diabetes mellitus without complication ([place])   . H/O osteopenia   . Hypertension   . Sleep apnea   . Uterine cancer [facility])     SURGICAL HISTORY: Past  Surgical History:  Procedure Laterality Date  . ABDOMINAL HYSTERECTOMY    . BREAST BIOPSY Left 04/2014   negative  . BREAST EXCISIONAL BIOPSY Left 2015  . CESAREAN SECTION    . COLONOSCOPY WITH PROPOFOL N/A 04/15/2015   Procedure: COLONOSCOPY WITH PROPOFOL;  Surgeon: Hulen Luster, MD;  [facility] END[facility]COPY;  Service: Gastroenterology;  Laterality: N/A;    SOCIAL HISTORY: Social History   Social History  . Marital status: Married    Spouse name: N/A  . Number of children: N/A  . Years of education: N/A   Occupational History  . Not on file.   Social History Main Topics  . Smoking status: Never Smoker  . Smokeless tobacco: Never Used  . Alcohol use Not on file  . Drug use: No  . Sexual activity: Not on file   Other Topics Concern  . Not on file   Social History Narrative  . No narrative on file    FAMILY HISTORY Family History  Problem Relation Age of Onset  . Breast cancer Neg Hx     ALLERGIES:  has No Known Allergies.  MEDICATIONS:  Current Outpatient Prescriptions  Medication Sig Dispense Refill  . Cinnamon Bark POWD 1 Dose by Does not apply route 1 day or 1 dose.    [name] FIBER, CORN DEXTRIN, PO Take 5 mg/day by mouth 1 day or 1 dose.    . fluticasone (FLONASE) 50 MCG/ACT nasal spray Place 2 sprays into both nostrils daily.    [name] glimepiride (AMARYL) 4 MG  tablet Take 4 mg by mouth 2 (two) times daily.    . Glucosamine Sulfate 500 MG TABS Take 1,500 mg by mouth 1 day or 1 dose.    [name] lisinopril-hydrochlorothiazide (PRINZIDE,ZESTORETIC) 20-12.5 MG per tablet Take 1 tablet by mouth daily.    . meloxicam (MOBIC) 15 MG tablet Take 15 mg by mouth daily.    . metFORMIN (GLUCOPHAGE) 500 MG tablet Take by mouth 2 (two) times daily with a meal.    . Multiple Vitamin (MULTIVITAMIN) tablet Take 1 tablet by mouth daily.    . sitaGLIPtin (JANUVIA) 100 MG tablet Take 100 mg by mouth daily.     No current facility-administered medications for this visit.     PHYSICAL  EXAMINATION:  ECOG PERFORMANCE STATUS: 0 - Asymptomatic  Vitals:   05/04/17 0843  BP: 137/87  Pulse: 80  Temp: 97.9 F (36.6 C)    Filed Weights   05/04/17 0843  Weight: 189 lb 4.8 oz (85.9 kg)     Physical Exam GENERAL: No distress, well nourished.  SKIN:  No rashes or significant lesions  HEAD: Normocephalic, No masses, lesions, tenderness or abnormalities  EYES: Conjunctiva are pink, non icteric ENT: External ears normal ,lips , buccal mucosa, and tongue normal and mucous membranes are moist  LYMPH: No palpable cervical and axillary lymphadenopathy  LUNGS: Clear to auscultation, no crackles or wheezes HEART: Regular rate & rhythm, no murmurs, no gallops, S1 normal and S2 normal  ABDOMEN: Abdomen soft, non-tender, normal bowel sounds, I did not appreciate any  masses or organomegaly  MUSCULOSKELETAL: No CVA tenderness and no tenderness on percussion of the back or rib cage.  EXTREMITIES: No edema, no skin discoloration or tenderness NEURO: Alert & oriented, no focal motor/sensory deficits. Breast exam was performed in lying down position. No evidence of any palpable masses or lumps in the right breast. No evidence of right axillary adenopathy. Left breast palpable axillary lymph node. Left breast outer quadrant mass not discretely palpable.   LABORATORY DATA: No recent labs.    RADIOGRAPHIC STUDIES: I have personally reviewed the radiological images as listed and agree with the findings in the report 04/25/2017 Mammogram Diagnostic Targeted ultrasound of the left breast demonstrates an irregular, hypoechoic mass at 1 o'clock, 3 cm from the nipple measuring 10 x 10 x 10 mm corresponding to the mass seen within the anterior upper, outer left breast. At 4 o'clock, 7 cm from the nipple, there is an irregular hypoechoic mass measuring 2.1 x 1.5 x 1.9 cm. Targeted ultrasound of the left axilla demonstrates an enlarged, abnormal axillary lymph node demonstrating cortical  thickening up to 6 mm. The masses at 1 and 4 o'clock are separated by approximately 4 cm sonographically. IMPRESSION: Highly suspicious left breast masses at 4 o'clock, 7 cm from the nipple and 1 o'clock, 3 cm from the nipple. Enlarged left axillary lymph node suspicious for metastatic disease.  US breast 04/25/2017 FINDINGS: There is an irregular spiculated mass in the slightly lower, outer left breast, posterior depth measuring approximately 2.6 x 2.5 x 1.9 cm. There is an additional irregular mass within the upper, outer left breast, anterior depth measuring approximately 1 cm in size, which is suspicious for a satellite lesion. There is asymmetric density between the 2 masses suggesting contiguous spread of disease. Mammographically, the masses are separated by approximately 6 cm and in total span approximately 9.6 cm.  Mammographic images were processed with CAD.  Targeted ultrasound of the left breast demonstrates an irregular, hypoechoic mass  at 1 o'clock, 3 cm from the nipple measuring 10 x 10 x 10 mm corresponding to the mass seen within the anterior upper, outer left breast. At 4 o'clock, 7 cm from the nipple, there is an irregular hypoechoic mass measuring 2.1 x 1.5 x 1.9 cm. Targeted ultrasound of the left axilla demonstrates an enlarged, abnormal axillary lymph node demonstrating cortical thickening up to 6 mm. The masses at 1 and 4 o'clock are separated by approximately 4 cm sonographically. IMPRESSION: Highly suspicious left breast masses at 4 o'clock, 7 cm from the nipple and 1 o'clock, 3 cm from the nipple. Enlarged left axillary lymph node suspicious for metastatic disease.  Pathology DIAGNOSIS:  A. BREAST, LEFT, 4 O'CLOCK 7 CM FROM NIPPLE; ULTRASOUND-GUIDED CORE BIOPSY:  - INVASIVE MAMMARY CARCINOMA, NO SPECIAL TYPE, GRADE 2, SEE COMMENT.  - INVASIVE CARCINOMA MEASURES 15 MM IN THIS SAMPLE.  B. BREAST, LEFT, 1 O'CLOCK, 3 CM FROM NIPPLE; ULTRASOUND-GUIDED  CORE  BIOPSY:  - INVASIVE MAMMARY CARCINOMA, NO SPECIAL TYPE, GRADE 2, SEE COMMENT.  - INVASIVE CARCINOMA MEASURES 10 MM IN THIS SAMPLE.  C. LYMPH NODE, LEFT AXILLA; ULTRASOUND-GUIDED CORE BIOPSY:  - METASTATIC CARCINOMA, 7 MM METASTASIS, SEE COMMENT.    BREAST BIOMARKER TEST RESULTS - TUMOR A, 4 O'CLOCK:  Estrogen Receptor (ER) Status: POSITIVE, >90% of cells with nuclear  positivity     Average intensity of staining: Strong  Progesterone Receptor (PR) Status: POSITIVE, >90% of cells with nuclear  positivity     Average intensity of staining: Strong  HER2 (by immunohistochemistry): Equivocal (score 2+)  Percentage of cells with uniform intense complete membrane staining: 1%   BREAST BIOMARKER TEST RESULTS - TUMOR B, 1 O'CLOCK:  Estrogen Receptor (ER) Status: POSITIVE, >90% of cells with nuclear  positivity     Average intensity of staining: Strong  Progesterone Receptor (PR) Status: POSITIVE, 51-90% of cells with  nuclear positivity     Average intensity of staining: Moderate to strong  HER2 (by immunohistochemistry): Equivocal (score 2+)  Percentage of cells with uniform intense complete membrane staining: 2%    ASSESSMENT/PLAN 1. Malignant neoplasm of upper-outer quadrant of left breast in female, estrogen receptor positive ([place])   2. Malignant neoplasm of lower-outer quadrant of left breast of female, estrogen receptor positive ([place])   3. History of uterine cancer    # Multifocal left breast cancer,cT2N1M0, one of the mass is associated with LVI and DCIS. As multifocal cancer is associated mor ewith mammography occult cancer, will order MRI breast to further characterize.   # Awaiting HER2 FISH results. If negative, plan neoadjuvant chemotherapy with AC--T which has higher rate of pCR. Hopefully to convert patient's lymph node status to negative, only needing sentinel lymph node sampling instead of ALND. If positive, plan neoadjuvant TCHP. Check 2D  echo. Discussed with patient about possible chemotherapy side effects.   # Most likely need mastectomy as the two mass are 4 cm apart. Defer to [place]. Refer to Dr.Smith to place Medi port for neoadjuvant chemotherapy.   Orders Placed This Encounter  Procedures  . MR BREAST BILATERAL W WO CONTRAST    Standing Status:   Future    Standing Expiration Date:   07/05/2018    Order Specific Question:   If indicated for the ordered procedure, I authorize the administration of contrast media per Radiology protocol    Answer:   Yes    Order Specific Question:   Reason for Exam (SYMPTOM  OR DIAGNOSIS REQUIRED)    Answer:  breast cancer    Order Specific Question:   What is the patient's sedation requirement?    Answer:   No Sedation    Order Specific Question:   Does the patient have a pacemaker or implanted devices?    Answer:   No    Order Specific Question:   Preferred imaging location?    Answer:   [facility] (table limit-300lbs)    Order Specific Question:   Radiology Contrast Protocol - do NOT remove file path    Answer:   [email or web address]  . CBC with Differential    Standing Status:   Future    Number of Occurrences:   1    Standing Expiration Date:   05/04/2018  . Comprehensive metabolic panel    Standing Status:   Future    Number of Occurrences:   1    Standing Expiration Date:   05/04/2018  . ECHOCARDIOGRAM COMPLETE    Standing Status:   Future    Standing Expiration Date:   08/04/2018    Order Specific Question:   Where should this test be performed    Answer:   [facility]    Order Specific Question:   Perflutren DEFINITY (image enhancing agent) should be administered unless hypersensitivity or allergy exist    Answer:   Do NOT administer Perflutren    Order Specific Question:   Reason for no Perflutren    Answer:   Other    Order Specific Question:   Specify other    Answer:   not necessary    Order Specific Question:   Expected  Date:    Answer:   ASAP   Patient return to visit after breast MRI, 2D echo, meet with Dr.Smith and HER2 status resulted to finalize the treatment plan. .  All questions were answered. The patient knows to call the clinic with any problems, questions or concerns. Thank you for this kind referral and the opportunity to participate in the care of this patient. A copy of today's note is routed to referring physician Dr. Crista Elliot.   Earlie Server, MD  05/04/2017 9:42 AM

## 2017-05-08 ENCOUNTER — Other Ambulatory Visit: Payer: Self-pay | Admitting: *Deleted

## 2017-05-09 ENCOUNTER — Ambulatory Visit
Admission: RE | Admit: 2017-05-09 | Discharge: 2017-05-09 | Disposition: A | Discharge status: Home / Self Care | Payer: BLUE CROSS/BLUE SHIELD | Source: Ambulatory Visit | Attending: Oncology | Admitting: Oncology

## 2017-05-09 DIAGNOSIS — Z170 Estrogen receptor positive status [ER+]: Secondary | ICD-10-CM | POA: Insufficient documentation

## 2017-05-09 DIAGNOSIS — C50412 Malignant neoplasm of upper-outer quadrant of left female breast: Secondary | ICD-10-CM

## 2017-05-09 LAB — SURGICAL PATHOLOGY

## 2017-05-09 NOTE — Progress Notes (Signed)
*  PRELIMINARY RESULTS* Echocardiogram 2D Echocardiogram has been performed.  Sherrie Sport 05/09/2017, 11:37 AM

## 2017-05-14 ENCOUNTER — Encounter (HOSPITAL_COMMUNITY): Payer: Self-pay | Admitting: Radiology

## 2017-05-14 ENCOUNTER — Ambulatory Visit (HOSPITAL_COMMUNITY)
Admission: RE | Admit: 2017-05-14 | Discharge: 2017-05-14 | Disposition: A | Discharge status: Home / Self Care | Payer: BLUE CROSS/BLUE SHIELD | Source: Ambulatory Visit | Attending: Oncology | Admitting: Oncology

## 2017-05-14 DIAGNOSIS — C50412 Malignant neoplasm of upper-outer quadrant of left female breast: Secondary | ICD-10-CM | POA: Insufficient documentation

## 2017-05-14 DIAGNOSIS — Z170 Estrogen receptor positive status [ER+]: Secondary | ICD-10-CM | POA: Insufficient documentation

## 2017-05-14 DIAGNOSIS — C773 Secondary and unspecified malignant neoplasm of axilla and upper limb lymph nodes: Secondary | ICD-10-CM | POA: Diagnosis not present

## 2017-05-14 MED ORDER — GADOBENATE DIMEGLUMINE 529 MG/ML IV SOLN
20.0000 mL | Freq: Once | INTRAVENOUS | Status: AC | PRN
  Administered 2017-05-14: 18 mL via INTRAVENOUS

## 2017-05-16 ENCOUNTER — Inpatient Hospital Stay: Payer: BLUE CROSS/BLUE SHIELD | Attending: Oncology | Admitting: Oncology

## 2017-05-16 ENCOUNTER — Encounter: Payer: Self-pay | Admitting: Oncology

## 2017-05-16 ENCOUNTER — Encounter: Payer: Self-pay | Admitting: Diagnostic Radiology

## 2017-05-16 VITALS — BP 133/77 | HR 93 | Temp 98.2°F | Resp 18 | Ht 58.5 in | Wt 189.1 lb

## 2017-05-16 DIAGNOSIS — C50512 Malignant neoplasm of lower-outer quadrant of left female breast: Secondary | ICD-10-CM

## 2017-05-16 DIAGNOSIS — Z923 Personal history of irradiation: Secondary | ICD-10-CM | POA: Diagnosis not present

## 2017-05-16 DIAGNOSIS — C773 Secondary and unspecified malignant neoplasm of axilla and upper limb lymph nodes: Secondary | ICD-10-CM

## 2017-05-16 DIAGNOSIS — E119 Type 2 diabetes mellitus without complications: Secondary | ICD-10-CM | POA: Insufficient documentation

## 2017-05-16 DIAGNOSIS — G4730 Sleep apnea, unspecified: Secondary | ICD-10-CM

## 2017-05-16 DIAGNOSIS — Z170 Estrogen receptor positive status [ER+]: Secondary | ICD-10-CM | POA: Insufficient documentation

## 2017-05-16 DIAGNOSIS — Z79899 Other long term (current) drug therapy: Secondary | ICD-10-CM | POA: Insufficient documentation

## 2017-05-16 DIAGNOSIS — I10 Essential (primary) hypertension: Secondary | ICD-10-CM | POA: Diagnosis not present

## 2017-05-16 DIAGNOSIS — F419 Anxiety disorder, unspecified: Secondary | ICD-10-CM

## 2017-05-16 DIAGNOSIS — C50412 Malignant neoplasm of upper-outer quadrant of left female breast: Secondary | ICD-10-CM | POA: Diagnosis not present

## 2017-05-16 DIAGNOSIS — Z7689 Persons encountering health services in other specified circumstances: Secondary | ICD-10-CM | POA: Insufficient documentation

## 2017-05-16 DIAGNOSIS — M8580 Other specified disorders of bone density and structure, unspecified site: Secondary | ICD-10-CM | POA: Diagnosis not present

## 2017-05-16 DIAGNOSIS — Z5111 Encounter for antineoplastic chemotherapy: Secondary | ICD-10-CM | POA: Diagnosis not present

## 2017-05-16 DIAGNOSIS — D649 Anemia, unspecified: Secondary | ICD-10-CM | POA: Insufficient documentation

## 2017-05-16 DIAGNOSIS — C50912 Malignant neoplasm of unspecified site of left female breast: Secondary | ICD-10-CM

## 2017-05-16 DIAGNOSIS — Z7984 Long term (current) use of oral hypoglycemic drugs: Secondary | ICD-10-CM

## 2017-05-16 DIAGNOSIS — Z8542 Personal history of malignant neoplasm of other parts of uterus: Secondary | ICD-10-CM | POA: Diagnosis not present

## 2017-05-16 MED ORDER — LOPERAMIDE HCL 2 MG PO CAPS: 2.0000 mg | ORAL_CAPSULE | ORAL | 1 refills | Status: DC

## 2017-05-16 MED ORDER — ONDANSETRON HCL 8 MG PO TABS: 8.0000 mg | ORAL_TABLET | Freq: Three times a day (TID) | ORAL | 0 refills | Status: DC | PRN

## 2017-05-16 NOTE — Progress Notes (Signed)
START ON PATHWAY REGIMEN - Breast   Dose-Dense AC q14 days:   A cycle is every 14 days:     Doxorubicin      Cyclophosphamide      Pegfilgrastim   **Always confirm dose/schedule in your pharmacy ordering system**    Paclitaxel 80 mg/m2 Weekly:   Administer weekly:     Paclitaxel   **Always confirm dose/schedule in your pharmacy ordering system**    Patient Characteristics: Preoperative or Nonsurgical Candidate (Clinical Staging), Neoadjuvant Therapy followed by Surgery, Invasive Disease, Chemotherapy, HER2 Negative/Unknown/Equivocal, ER Positive Therapeutic Status: Preoperative or Nonsurgical Candidate (Clinical Staging) AJCC M Category: cM0 AJCC Grade: GX Breast Surgical Plan: Neoadjuvant Therapy followed by Surgery ER Status: Positive (+) AJCC 8 Stage Grouping: IIB HER2 Status: Negative (-) AJCC T Category: cTX AJCC N Category: cNX PR Status: Positive (+) Intent of Therapy: Curative Intent, Discussed with Patient

## 2017-05-16 NOTE — Progress Notes (Signed)
[place] [facility] Follow Up Visit.  Patient Care Team: Glendon Axe, MD as PCP - General (Internal Medicine)  CHIEF COMPLAINTS/PURPOSE OF CONSULTATION: History of Uterine Cancer, treated with radiation.   HISTORY OF PRESENTING ILLNESS: Dominique Guzman 66 y.o. female with PMH listed as below is referred by Dr.Schermerhorn to [name] for evaluation and management of newly diagnosed breast cancer. Patient had a history a left breast mass biopsy followed by breast excisonal biopsy in 2015 with benign pathology.  She also has a history of Uterine cancer which was treated with RT with Dr.Chrystal.  She underwent routine screening mammogram on 04/17/2017 which recommended a possible mass on the left breast. Left diagnostic mammogram showed a highly suspicious left breast mass at 4 o'clock, 7 cm from the nipple and 1 o'clock,,  About 3 cm from the nipple. Enlarged left axillary lymph node suspicious for metastatic disease.  US breast confirmed a left breast mass of 2.1cm, 4 o'clock, and 1.1 cm mass at 1 o'clock.The masses at 1 and 4 o'clock are separated by approximately 4 cm sonographically Biopsy of the two mass and axillary lymph node revealed invasive carcinoma, grade 2. Both are ER,PR positive. HER2 equivocal, FISH pending. 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   INTERVAL HISTORY Patient has had MRI breast completed and has been evaluated by Dr.Smith.  Patient comes with her husband to the visit. She is anxious about the MRI results and wants to discuss about the treatment plan.  She has no complaints today  Review of Systems  Constitutional: Negative.   HENT:  Negative.   Eyes: Negative.   Respiratory: Negative.   Cardiovascular: Negative.   Gastrointestinal: Negative.   Endocrine: Negative.   Genitourinary: Negative.    Musculoskeletal: Negative.   Skin: Negative.     MEDICAL HISTORY: Past Medical History:  Diagnosis Date  . Anemia   . Benign neoplasm  of colon   . Cancer ([place])   . Diabetes mellitus without complication ([place])   . H/O osteopenia   . Hypertension   . Sleep apnea   . Uterine cancer [facility])     SURGICAL HISTORY: Past Surgical History:  Procedure Laterality Date  . ABDOMINAL HYSTERECTOMY    . BREAST BIOPSY Left 04/2014   negative  . BREAST EXCISIONAL BIOPSY Left 2015  . CESAREAN SECTION    . COLONOSCOPY WITH PROPOFOL N/A 04/15/2015   Procedure: COLONOSCOPY WITH PROPOFOL;  Surgeon: Hulen Luster, MD;  Location: [facility] ENDOSCOPY;  Service: Gastroenterology;  Laterality: N/A;    SOCIAL HISTORY: Social History   Social History  . Marital status: Married    Spouse name: N/A  . Number of children: N/A  . Years of education: N/A   Occupational History  . Not on file.   Social History Main Topics  . Smoking status: Never Smoker  . Smokeless tobacco: Never Used  . Alcohol use Not on file  . Drug use: No  . Sexual activity: Not on file   Other Topics Concern  . Not on file   Social History Narrative  . No narrative on file    FAMILY HISTORY Family History  Problem Relation Age of Onset  . Breast cancer Neg Hx     ALLERGIES:  has No Known Allergies.  MEDICATIONS:  Current Outpatient Prescriptions  Medication Sig Dispense Refill  . Calcium Carb-Cholecalciferol (CALCIUM 600-D PO) Take 1 tablet by mouth daily.    [name] CINNAMON PO Take 2 capsules by mouth daily.    [name]  FIBER PO Take 1 capsule by mouth daily.    . fluticasone (FLONASE) 50 MCG/ACT nasal spray Place 2 sprays into both nostrils daily.    [name] glimepiride (AMARYL) 4 MG tablet Take 4 mg by mouth 2 (two) times daily.    . Glucosamine HCl (GLUCOSAMINE PO) Take 1 tablet by mouth daily.    [name] lisinopril-hydrochlorothiazide (PRINZIDE,ZESTORETIC) 20-12.5 MG per tablet Take 1 tablet by mouth daily.    . meloxicam (MOBIC) 15 MG tablet Take 15 mg by mouth daily as needed for pain.     . metFORMIN (GLUCOPHAGE) 500 MG tablet Take 1,000 mg by mouth 2 (two) times daily.      . Multiple Vitamin (MULTIVITAMIN) tablet Take 1 tablet by mouth daily.    . sitaGLIPtin (JANUVIA) 100 MG tablet Take 100 mg by mouth daily.    [name] loperamide (IMODIUM) 2 MG capsule Take 1 capsule (2 mg total) by mouth See admin instructions. With onset of loose stool, take 14m followed by 250mevery 2 hours until 12 hours have passed without loose bowel movement. Maximum: 16 mg/day 120 capsule 1  . ondansetron (ZOFRAN) 8 MG tablet Take 1 tablet (8 mg total) by mouth every 8 (eight) hours as needed for nausea or vomiting. 120 tablet 0   No current facility-administered medications for this visit.     PHYSICAL EXAMINATION:  ECOG PERFORMANCE STATUS: 0 - Asymptomatic  Vitals:   05/16/17 1121  BP: 133/77  Pulse: 93  Resp: 18  Temp: 98.2 F (36.8 C)    Filed Weights   05/16/17 1121  Weight: 189 lb 2 oz (85.8 kg)     Physical Exam GENERAL: No distress, well nourished.  SKIN:  No rashes or significant lesions  HEAD: Normocephalic, No masses, lesions, tenderness or abnormalities  EYES: Conjunctiva are pink, non icteric ENT: External ears normal ,lips , buccal mucosa, and tongue normal and mucous membranes are moist  LYMPH: No palpable cervical and axillary lymphadenopathy  LUNGS: Clear to auscultation, no crackles or wheezes HEART: Regular rate & rhythm, no murmurs, no gallops, S1 normal and S2 normal  ABDOMEN: Abdomen soft, non-tender, normal bowel sounds, I did not appreciate any  masses or organomegaly  MUSCULOSKELETAL: No CVA tenderness and no tenderness on percussion of the back or rib cage.  EXTREMITIES: No edema, no skin discoloration or tenderness NEURO: Alert & oriented, no focal motor/sensory deficits. Breast exam was performed in lying down position. No evidence of any palpable masses or lumps in the right breast. No evidence of right axillary adenopathy. Left breast palpable axillary lymph node. Left breast outer quadrant mass not discretely palpable.   LABORATORY  DATA: No recent labs.  CBC    Component Value Date/Time   WBC 9.7 05/04/2017 0931   RBC 4.79 05/04/2017 0931   HGB 13.6 05/04/2017 0931   HCT 38.5 05/04/2017 0931   PLT 365 05/04/2017 0931   MCV 80.4 05/04/2017 0931   MCH 28.4 05/04/2017 0931   MCHC 35.3 05/04/2017 0931   RDW 13.7 05/04/2017 0931   LYMPHSABS 1.7 05/04/2017 0931   MONOABS 0.8 05/04/2017 0931   EOSABS 0.4 05/04/2017 0931   BASOSABS 0.0 05/04/2017 0931   CMP Latest Ref Rng & Units 05/04/2017  Glucose 65 - 99 mg/dL 132(H)  BUN 6 - 20 mg/dL 19  Creatinine 0.44 - 1.00 mg/dL 0.85  Sodium 135 - 145 mmol/L 137  Potassium 3.5 - 5.1 mmol/L 4.2  Chloride 101 - 111 mmol/L 101  CO2 22 - 32  mmol/L 27  Calcium 8.9 - 10.3 mg/dL 10.0  Total Protein 6.5 - 8.1 g/dL 7.2  Total Bilirubin 0.3 - 1.2 mg/dL 0.5  Alkaline Phos 38 - 126 U/L 84  AST 15 - 41 U/L 20  ALT 14 - 54 U/L 12(L)   RADIOGRAPHIC STUDIES: I have personally reviewed the radiological images as listed and agree with the findings in the report 04/25/2017 Mammogram Diagnostic Targeted ultrasound of the left breast demonstrates an irregular, hypoechoic mass at 1 o'clock, 3 cm from the nipple measuring 10 x 10 x 10 mm corresponding to the mass seen within the anterior upper, outer left breast. At 4 o'clock, 7 cm from the nipple, there is an irregular hypoechoic mass measuring 2.1 x 1.5 x 1.9 cm. Targeted ultrasound of the left axilla demonstrates an enlarged, abnormal axillary lymph node demonstrating cortical thickening up to 6 mm. The masses at 1 and 4 o'clock are separated by approximately 4 cm sonographically. IMPRESSION: Highly suspicious left breast masses at 4 o'clock, 7 cm from the nipple and 1 o'clock, 3 cm from the nipple. Enlarged left axillary lymph node suspicious for metastatic disease.  US breast 04/25/2017 FINDINGS: There is an irregular spiculated mass in the slightly lower, outer left breast, posterior depth measuring approximately 2.6 x 2.5 x  1.9 cm. There is an additional irregular mass within the upper, outer left breast, anterior depth measuring approximately 1 cm in size, which is suspicious for a satellite lesion. There is asymmetric density between the 2 masses suggesting contiguous spread of disease. Mammographically, the masses are separated by approximately 6 cm and in total span approximately 9.6 cm.  Mammographic images were processed with CAD.  Targeted ultrasound of the left breast demonstrates an irregular, hypoechoic mass at 1 o'clock, 3 cm from the nipple measuring 10 x 10 x 10 mm corresponding to the mass seen within the anterior upper, outer left breast. At 4 o'clock, 7 cm from the nipple, there is an irregular hypoechoic mass measuring 2.1 x 1.5 x 1.9 cm. Targeted ultrasound of the left axilla demonstrates an enlarged, abnormal axillary lymph node demonstrating cortical thickening up to 6 mm. The masses at 1 and 4 o'clock are separated by approximately 4 cm sonographically. IMPRESSION: Highly suspicious left breast masses at 4 o'clock, 7 cm from the nipple and 1 o'clock, 3 cm from the nipple. Enlarged left axillary lymph node suspicious for metastatic disease.  Pathology DIAGNOSIS:  A. BREAST, LEFT, 4 O'CLOCK 7 CM FROM NIPPLE; ULTRASOUND-GUIDED CORE BIOPSY:  - INVASIVE MAMMARY CARCINOMA, NO SPECIAL TYPE, GRADE 2, SEE COMMENT.  - INVASIVE CARCINOMA MEASURES 15 MM IN THIS SAMPLE.  B. BREAST, LEFT, 1 O'CLOCK, 3 CM FROM NIPPLE; ULTRASOUND-GUIDED CORE  BIOPSY:  - INVASIVE MAMMARY CARCINOMA, NO SPECIAL TYPE, GRADE 2, SEE COMMENT.  - INVASIVE CARCINOMA MEASURES 10 MM IN THIS SAMPLE.  C. LYMPH NODE, LEFT AXILLA; ULTRASOUND-GUIDED CORE BIOPSY:  - METASTATIC CARCINOMA, 7 MM METASTASIS, SEE COMMENT.    BREAST BIOMARKER TEST RESULTS - TUMOR A, 4 O'CLOCK:  Estrogen Receptor (ER) Status: POSITIVE, >90% of cells with nuclear  positivity     Average intensity of staining: Strong  Progesterone Receptor (PR)  Status: POSITIVE, >90% of cells with nuclear  positivity     Average intensity of staining: Strong  HER2 (by immunohistochemistry): Equivocal (score 2+)  Percentage of cells with uniform intense complete membrane staining: 1%   BREAST BIOMARKER TEST RESULTS - TUMOR B, 1 O'CLOCK:  Estrogen Receptor (ER) Status: POSITIVE, >90% of cells with  nuclear  positivity     Average intensity of staining: Strong  Progesterone Receptor (PR) Status: POSITIVE, 51-90% of cells with  nuclear positivity     Average intensity of staining: Moderate to strong  HER2 (by immunohistochemistry): Equivocal (score 2+)  Percentage of cells with uniform intense complete membrane staining: 2%    ADDENDUM #2   BREAST BIOMARKER TEST: HER2 FISH - TUMOR A, 4 O'CLOCK:  HER2 (ERBB2) (by in situ hybridization): NEGATIVE (not amplified)  Number of observers: 2  Number of invasive tumor cells counted: 40  Dual probe assay  Average number of HER2 signals per cell: 3.6  Average number of CEP17 signals per cell: 2.5  HER2/CEP17 ratio: 1.42   05/14/2017 MRI breast IMPRESSION: 1. Two sites of biopsy proven malignancy seen in the left breast, which span up to 11.3 cm. There are intervening areas of non mass enhancement and a small suspicious enhancing mass. 2. The biopsy-proven metastatic left axillary lymph node is identified. There are 3 other prominent left axillary lymph nodes which are not definitely pathologically enlarged. 3.  No evidence of right breast malignancy  ASSESSMENT/PLAN 1. Carcinoma of left breast, estrogen and progesterone receptor positive ([place])   2. Malignant neoplasm of upper-outer quadrant of left breast in female, estrogen receptor positive ([place])   3. Malignant neoplasm of lower-outer quadrant of left breast of female, estrogen receptor positive ([place])   4. 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  I had a lengthy discussion with patient and her husband.  I discussed with patient that her MRI of the breast showed enhancing mass as well as linear no masses enhancement extends between the 2 sites of biopsy-proven malignancy. Given the multifocal feature she will need mastectomy. I recommend new adjuvant chemotherapy with ddAC--T weekly , Hopefully to convert patient's lymph node status to negative, only needing sentinel lymph node sampling instead of ALND. Discussed with patient that close monitor how she tolerates the chemotherapy.  #Growth factor-Neulasta would be given as prophylaxis for chemotherapy-induced neutropenia to prevent febrile neutropenias. Discussed potential side effect- myalgias/arthralgias # ddAC has been shown in the studies to be superior comparing to regular AC, however toxicity is higher. If she can not tolerate dose dense regimen, will change to [facility] every 3 weeks.   #Patient and her husband asked about the staging. I informed patient that for now as the clinical staging she is stage IIB breast cancer, ER/PR positive HER-2 negative by fish. However she has multifocal diseases, her final pathology from the surgery may change her staging.   #I discussed in length with patient and her husband regarding the rationale of using neoadjuvant chemotherapy with  ddAC--T weekly  .as well as the potential side effects including but not limited to hair loss, nausea vomiting diarrhea, mouth sores,  low blood counts, bleeding, heart failure, secondary malignancy, life-threatening infection. Will sigh up chemotherapy class for her.   #She will need port placement which is already scheduled to be done by Dr. Tamala Julian on 05/22/2017.  # CBC, CMP Prior to each treatment cycles.  # Patient has lab encounter with Dr. Candiss Norse primary care provider for labs. She asked if these labs can be done together so that does not need to get that twice. I will combine the orders and  will fax the results to Dr. Keturah Barre office.    Orders Placed This Encounter  Procedures  .  Hemoglobin A1c    Standing Status:   Future    Standing Expiration Date:   05/16/2018  . Lipid panel    Standing Status:   Future    Standing Expiration Date:   06/16/2017  . Microalbumin / creatinine urine ratio    Standing Status:   Future    Standing Expiration Date:   06/16/2017  . TSH    Standing Status:   Future    Standing Expiration Date:   06/16/2017  . Urinalysis, Complete w Microscopic    Standing Status:   Future    Standing Expiration Date:   06/16/2017   Patient return on Day1 of chemotherapy.   All questions were answered. The patient knows to call the clinic with any problems, questions or concerns. Total face to face encounter time for this patient visit was 60 min. >50% of the time was  spent in counseling and coordination of care.    Earlie Server, MD  05/16/2017 1:26 PM

## 2017-05-16 NOTE — Patient Instructions (Signed)

## 2017-05-17 ENCOUNTER — Inpatient Hospital Stay: Payer: BLUE CROSS/BLUE SHIELD

## 2017-05-18 ENCOUNTER — Telehealth: Payer: Self-pay | Admitting: *Deleted

## 2017-05-18 ENCOUNTER — Other Ambulatory Visit: Payer: Self-pay | Admitting: Oncology

## 2017-05-18 ENCOUNTER — Encounter
Admission: RE | Admit: 2017-05-18 | Discharge: 2017-05-18 | Disposition: A | Discharge status: Home / Self Care | Payer: BLUE CROSS/BLUE SHIELD | Source: Ambulatory Visit | Attending: Surgery | Admitting: Surgery

## 2017-05-18 DIAGNOSIS — I493 Ventricular premature depolarization: Secondary | ICD-10-CM | POA: Insufficient documentation

## 2017-05-18 DIAGNOSIS — I10 Essential (primary) hypertension: Secondary | ICD-10-CM | POA: Diagnosis not present

## 2017-05-18 DIAGNOSIS — Z01810 Encounter for preprocedural cardiovascular examination: Secondary | ICD-10-CM | POA: Insufficient documentation

## 2017-05-18 HISTORY — DX: Unspecified osteoarthritis, unspecified site: M19.90

## 2017-05-18 MED ORDER — LIDOCAINE-PRILOCAINE 2.5-2.5 % EX CREA: 1.0000 "application " | TOPICAL_CREAM | CUTANEOUS | 3 refills | Status: DC | PRN

## 2017-05-18 NOTE — Pre-Procedure Instructions (Signed)
[facility]  ECHO COMPLETE WO IMAGE ENHANCING AGENT  Order# [id number]  Reading physician: Minna Merritts, MD Ordering physician: Earlie Server, MD Study date: 05/09/17  Study Result   Result status: Final result                   *[place], [place] [id number]                            [phone]  ------------------------------------------------------------------- Transthoracic Echocardiography  Patient:    Dominique Guzman, Dominique Guzman MR #:       [id number] Study Date: 05/09/2017 Gender:     F Age:        66 Height:     147.3 cm Weight:     85.9 kg BSA:        1.93 m^2 Pt. 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  Risk factors: Hypertension. Diabetes mellitus.  ------------------------------------------------------------------- Study Conclusions  - Left ventricle: The cavity size was normal. Systolic function was   normal. The estimated ejection fraction was in the range of 60%   to 65%. Wall motion was normal; there were no regional wall   motion abnormalities. Doppler parameters are consistent with   abnormal left ventricular relaxation (grade 1 diastolic   dysfunction). - Left atrium: The atrium was normal in size. - Right ventricle: Systolic function was normal. - Pulmonary arteries: Systolic pressure was within the normal    range.  ------------------------------------------------------------------- Study data:   Study status:  Routine.  Procedure:  The patient reported no pain pre or post test.          Transthoracic echocardiography.  M-mode, complete 2D, spectral Doppler, and color Doppler.  Birthdate:  Patient birthdate: 1950-12-07.  Age:  Patient is 66 yr old.  Sex:  Gender: female.    BMI: 39.6 kg/m^2.  Blood pressure:     137/87  Patient status:  Outpatient.  Study date: Study date: 05/09/2017. Study time: 11:05 AM.  Location:  Echo laboratory.  -------------------------------------------------------------------  ------------------------------------------------------------------- Left ventricle:  The cavity size was normal. Systolic function was normal. The estimated ejection fraction was in the range of 60% to 65%. Wall motion was normal; there were no regional wall motion abnormalities. Doppler parameters are consistent with abnormal left ventricular relaxation (grade 1 diastolic dysfunction).  ------------------------------------------------------------------- Aortic valve:   Trileaflet; normal thickness, mildly calcified leaflets. Mobility was not restricted.  Doppler:  Transvalvular velocity was within the normal range. There was no stenosis. There was no regurgitation.    Peak velocity ratio of LVOT to aortic valve: 0.74. Valve area (Vmax): 2.34 cm^2. Indexed valve area (Vmax): 1.22 cm^2/m^2.    Peak gradient (S): 4 mm Hg.  ------------------------------------------------------------------- Aorta:  Aortic  root: The aortic root was normal in size.  ------------------------------------------------------------------- Mitral valve:   Structurally normal valve.   Mobility was not restricted.  Doppler:  Transvalvular velocity was within the normal range. There was no evidence for stenosis. There was no regurgitation.    Valve area by pressure half-time: 2.59 cm^2. Indexed valve area by  pressure half-time: 1.35 cm^2/m^2.  ------------------------------------------------------------------- Left atrium:  The atrium was normal in size.  ------------------------------------------------------------------- Right ventricle:  The cavity size was normal. Wall thickness was normal. Systolic function was normal.  ------------------------------------------------------------------- Pulmonic valve:    Doppler:  Transvalvular velocity was within the normal range. There was no evidence for stenosis.  ------------------------------------------------------------------- Tricuspid valve:   Structurally normal valve.    Doppler: Transvalvular velocity was within the normal range. There was trivial regurgitation.  ------------------------------------------------------------------- Pulmonary artery:   The main pulmonary artery was normal-sized. Systolic pressure was within the normal range.  ------------------------------------------------------------------- Right atrium:  The atrium was normal in size.  ------------------------------------------------------------------- Pericardium:  There was no pericardial effusion.  ------------------------------------------------------------------- Systemic veins: Inferior vena cava: The vessel was normal in size.  ------------------------------------------------------------------- Measurements   Left ventricle                           Value          Reference  LV ID, ED, PLAX chordal                  45.3  mm       43 - 52  LV ID, ES, PLAX chordal                  28    mm       23 - 38  LV fx shortening, PLAX chordal           38    %        >=29  LV PW thickness, ED                      7.77  mm       ----------  IVS/LV PW ratio, ED              (H)     1.52           <=1.3  LV e&', lateral                           6.85  cm/s     ----------  LV E/e&', lateral                         8.99           ----------  LV e&', medial                             4.13  cm/s     ----------  LV E/e&', medial                          14.92          ----------  LV e&', average                           5.49  cm/s     ----------  LV E/e&', average                         11.22          ----------    Ventricular septum                       Value          Reference  IVS thickness, ED                        11.8  mm       ----------    LVOT                                     Value          Reference  LVOT ID, S                               20    mm       ----------  LVOT area                                3.14  cm^2     ----------  LVOT peak velocity, S                    72.3  cm/s     ----------    Aortic valve                             Value          Reference  Aortic valve peak velocity, S            97.1  cm/s     ----------  Aortic peak gradient, S                  4     mm Hg    ----------  Velocity ratio, peak, LVOT/AV            0.74           ----------  Aortic valve area, peak velocity         2.34  cm^2     ----------  Aortic valve area/bsa, peak              1.22  cm^2/m^2 ----------  velocity    Aorta                                    Value          Reference  Aortic root ID, ED                       29    mm       ----------    Left atrium                              Value          Reference  LA ID, A-P, ES  33    mm       ----------  LA ID/bsa, A-P                           1.71  cm/m^2   <=2.2  LA volume, S                             22    ml       ----------  LA volume/bsa, S                         11.4  ml/m^2   ----------  LA volume, ES, 1-p A4C                   19.3  ml       ----------  LA volume/bsa, ES, 1-p A4C               10    ml/m^2   ----------  LA volume, ES, 1-p A2C                   23.1  ml       ----------  LA volume/bsa, ES, 1-p A2C               12    ml/m^2   ----------    Mitral valve                             Value          Reference  Mitral  E-wave peak velocity              61.6  cm/s     ----------  Mitral A-wave peak velocity              91.7  cm/s     ----------  Mitral deceleration time         (H)     289   ms       150 - 230  Mitral pressure half-time                85    ms       ----------  Mitral E/A ratio, peak                   0.7            ----------  Mitral valve area, PHT, DP               2.59  cm^2     ----------  Mitral valve area/bsa, PHT, DP           1.35  cm^2/m^2 ----------    Right atrium                             Value          Reference  RA ID, S-I, ES, A4C                      42.5  mm       34 - 49  RA area, ES, A4C  10.5  cm^2     8.3 - 19.5  RA volume, ES, A/L                       20.2  ml       ----------  RA volume/bsa, ES, A/L                   10.5  ml/m^2   ----------    Right ventricle                          Value          Reference  RV ID, ED, PLAX                          29.5  mm       19 - 38  TAPSE                                    28.2  mm       ----------  RV s&', lateral, S                        17    cm/s     ----------    Pulmonic valve                           Value          Reference  Pulmonic valve peak velocity, S          51.4  cm/s     ----------  Legend: (L)  and  (H)  mark values outside specified reference range.  ------------------------------------------------------------------- Prepared and Electronically Authenticated by  Esmond Plants, MD, [facility] 2018-08-01T12:44:47  PACS Images   Show images for ECHOCARDIOGRAM COMPLETE  Patient Information   Patient Name Dominique Guzman, Dominique Guzman Sex Female DOB 05/01/1951 SSN [id number]  Reason For Exam  Priority: Routine  Dx: Malignant neoplasm of upper-outer quadrant of left breast in female, estrogen receptor positive ([place]) [C50.412, Z17.0 (ICD-10-CM)]  Surgical History   Surgical History   No past medical history on file.    Other Surgical History   Procedure Laterality Date  Comment Source  ABDOMINAL HYSTERECTOMY    Provider  BREAST BIOPSY Left 04/2014 negative Provider  BREAST EXCISIONAL BIOPSY Left 2015  Provider  CESAREAN SECTION    Provider  COLONOSCOPY WITH PROPOFOL N/A 04/15/2015 Procedure: COLONOSCOPY WITH PROPOFOL; Surgeon: Hulen Luster, MD; Location: [facility] ENDOSCOPY; Service: Gastroenterology; Laterality: N/A; Provider    Performing Technologist/Nurse   Performing Technologist/Nurse: Gabriel Cirri                    Implants     No active implants to display in this view.  Order-Level Documents:   There are no order-level documents.  Encounter-Level Documents - 05/09/2017:   Electronic signature on 05/09/2017 10:47 AM      Signed   Electronically signed by Minna Merritts, MD on 05/09/17 at 1245 EDT  Printable Result Report   Result Report   External Result Report   External Result

## 2017-05-18 NOTE — Telephone Encounter (Signed)
Patient called to ask if we are going to give her Rx for EMLA cream. Please advise

## 2017-05-18 NOTE — Pre-Procedure Instructions (Signed)
SPOKE WITH DR Ronelle Nigh REGARDING ABNORMAL EKG WHEN COMPARED WITH 2015 EKG-T WAVE INVERSION NOW EVIDENT IN ANTERIOR LEADS-DR KEPHART WENT IN EPIC AND REVIEWED BOTH EKGS AND STATES PT NEEDS MEDICAL CLEARANCE-CALLED SHERRY AT DR SMITHS OFFICE TO INFORM HER OF THIS AND THE OFFICE WAS ALREADY CLOSED- SENT FAX WITH CLEARANCE REQUEST TO DR [facility] OFFICE AND DR Ledell Noss OFFICE-WILL CALL SHERRY AT DR [facility] OFFICE ON Monday TO INFORM HER OF CLEARANCE

## 2017-05-18 NOTE — Patient Instructions (Signed)
  Your procedure is scheduled on: 05-22-17 TUESDAY Report to Same Day Surgery 2nd floor medical mall [facility] Entrance-take elevator on left to 2nd floor.  Check in with surgery information desk.) To find out your arrival time please call [phone] between 1PM - 3PM on 05-21-17 MONDAY  Remember: Instructions that are not followed completely may result in serious medical risk, up to and including death, or upon the discretion of your surgeon and anesthesiologist your surgery may need to be rescheduled.    _x___ 1. Do not eat food or drink liquids after midnight. No gum chewing or hard candies.     __x__ 2. No Alcohol for 24 hours before or after surgery.   __x__3. No Smoking for 24 prior to surgery.   ____  4. Bring all medications with you on the day of surgery if instructed.    __x__ 5. Notify your doctor if there is any change in your medical condition     (cold, fever, infections).     Do not wear jewelry, make-up, hairpins, clips or nail polish.  Do not wear lotions, powders, or perfumes. You may wear deodorant.  Do not shave 48 hours prior to surgery. Men may shave face and neck.  Do not bring valuables to the hospital.    [facility] is not responsible for any belongings or valuables.               Contacts, dentures or bridgework may not be worn into surgery.  Leave your suitcase in the car. After surgery it may be brought to your room.  For patients admitted to the hospital, discharge time is determined by your treatment team.   Patients discharged the day of surgery will not be allowed to drive home.  You will need someone to drive you home and stay with you the night of your procedure.    Please read over the following fact sheets that you were given:      ____ Take anti-hypertensive (unless it includes a diuretic), cardiac, seizure, asthma,     anti-reflux and psychiatric medicines. These include:  1. NONE  2.  3.  4.  5.  6.  ____Fleets enema or Magnesium  Citrate as directed.   _x___ Use CHG Soap or sage wipes as directed on instruction sheet   ____ Use inhalers on the day of surgery and bring to hospital day of surgery  _X___ Stop Metformin and Janumet 2 days prior to surgery-LAST DOSE ON Saturday, AUGUST 11TH    ____ Take 1/2 of usual insulin dose the night before surgery and none on the morning surgery.   ____ Follow recommendations from Cardiologist, Pulmonologist or PCP regarding stopping Aspirin, Coumadin, Pllavix ,Eliquis, Effient, or Pradaxa, and Pletal.  X____Stop Anti-inflammatories such as Advil, Aleve, Ibuprofen, Motrin, Naproxen,MELOXICAM, Naprosyn, Goodies powders or aspirin products NOW-OK to take Tylenol   _x___ Stop supplements until after surgery-STOP CINNAMON AND GLUCOSAMINE NOW-MAY RESUME AFTER SURGERY   _X___ Bring C-Pap to the hospital.

## 2017-05-18 NOTE — Telephone Encounter (Signed)
Rx sent to pharmacy   

## 2017-05-21 MED ORDER — CEFAZOLIN SODIUM-DEXTROSE 2-4 GM/100ML-% IV SOLN
2.0000 g | Freq: Once | INTRAVENOUS | Status: AC
  Administered 2017-05-22: 2 g via INTRAVENOUS

## 2017-05-21 NOTE — Pre-Procedure Instructions (Signed)
SPOKE WITH SHERRY AT DR SMITHS OFFICE REGARDING STATUS OF CLEARANCE-SHERRY SAID DR [facility] IS GETTING READY TO REVIEW EKG AND SHERRY SAID IF PT DOES NOT GET CLEARED THEN SHE WILL NOTIFY PT-CHART BEING SENT OVER TO SDS

## 2017-05-21 NOTE — Pre-Procedure Instructions (Signed)
SPOKE WITH SHERRY AT DR [facility] OFFICE EARLIER THIS MORNING. SHE DID RECEIVE THE CLEARANCE THAT WAS NEEDED ON PT- SHE STATES SHE WILL F/U WITH DR Ledell Noss OFFICE AT [facility] ABOUT CLEARANCE

## 2017-05-22 ENCOUNTER — Encounter
Admission: RE | Disposition: A | Discharge status: Home / Self Care | Payer: Self-pay | Source: Ambulatory Visit | Attending: Surgery

## 2017-05-22 ENCOUNTER — Ambulatory Visit: Payer: BLUE CROSS/BLUE SHIELD | Admitting: Certified Registered Nurse Anesthetist

## 2017-05-22 ENCOUNTER — Encounter: Payer: Self-pay | Admitting: Certified Registered Nurse Anesthetist

## 2017-05-22 ENCOUNTER — Ambulatory Visit
Admission: RE | Admit: 2017-05-22 | Discharge: 2017-05-22 | Disposition: A | Discharge status: Home / Self Care | Payer: BLUE CROSS/BLUE SHIELD | Source: Ambulatory Visit | Attending: Surgery | Admitting: Surgery

## 2017-05-22 ENCOUNTER — Ambulatory Visit: Payer: BLUE CROSS/BLUE SHIELD

## 2017-05-22 DIAGNOSIS — Z836 Family history of other diseases of the respiratory system: Secondary | ICD-10-CM | POA: Insufficient documentation

## 2017-05-22 DIAGNOSIS — Z8249 Family history of ischemic heart disease and other diseases of the circulatory system: Secondary | ICD-10-CM | POA: Diagnosis not present

## 2017-05-22 DIAGNOSIS — Z833 Family history of diabetes mellitus: Secondary | ICD-10-CM | POA: Diagnosis not present

## 2017-05-22 DIAGNOSIS — M8580 Other specified disorders of bone density and structure, unspecified site: Secondary | ICD-10-CM | POA: Diagnosis not present

## 2017-05-22 DIAGNOSIS — E7800 Pure hypercholesterolemia, unspecified: Secondary | ICD-10-CM | POA: Diagnosis not present

## 2017-05-22 DIAGNOSIS — Z86010 Personal history of colonic polyps: Secondary | ICD-10-CM | POA: Diagnosis not present

## 2017-05-22 DIAGNOSIS — Z923 Personal history of irradiation: Secondary | ICD-10-CM | POA: Insufficient documentation

## 2017-05-22 DIAGNOSIS — Z8489 Family history of other specified conditions: Secondary | ICD-10-CM | POA: Diagnosis not present

## 2017-05-22 DIAGNOSIS — C773 Secondary and unspecified malignant neoplasm of axilla and upper limb lymph nodes: Secondary | ICD-10-CM | POA: Diagnosis not present

## 2017-05-22 DIAGNOSIS — C50512 Malignant neoplasm of lower-outer quadrant of left female breast: Secondary | ICD-10-CM | POA: Diagnosis not present

## 2017-05-22 DIAGNOSIS — Z8542 Personal history of malignant neoplasm of other parts of uterus: Secondary | ICD-10-CM | POA: Insufficient documentation

## 2017-05-22 DIAGNOSIS — Z853 Personal history of malignant neoplasm of breast: Secondary | ICD-10-CM | POA: Diagnosis present

## 2017-05-22 DIAGNOSIS — Z7984 Long term (current) use of oral hypoglycemic drugs: Secondary | ICD-10-CM | POA: Insufficient documentation

## 2017-05-22 DIAGNOSIS — E119 Type 2 diabetes mellitus without complications: Secondary | ICD-10-CM | POA: Insufficient documentation

## 2017-05-22 DIAGNOSIS — Z79899 Other long term (current) drug therapy: Secondary | ICD-10-CM | POA: Insufficient documentation

## 2017-05-22 DIAGNOSIS — Z90710 Acquired absence of both cervix and uterus: Secondary | ICD-10-CM | POA: Insufficient documentation

## 2017-05-22 DIAGNOSIS — I10 Essential (primary) hypertension: Secondary | ICD-10-CM | POA: Diagnosis not present

## 2017-05-22 DIAGNOSIS — C50919 Malignant neoplasm of unspecified site of unspecified female breast: Secondary | ICD-10-CM

## 2017-05-22 DIAGNOSIS — G4730 Sleep apnea, unspecified: Secondary | ICD-10-CM | POA: Insufficient documentation

## 2017-05-22 HISTORY — PX: PORTACATH PLACEMENT: SHX2246

## 2017-05-22 LAB — GLUCOSE, CAPILLARY
GLUCOSE-CAPILLARY: 136 mg/dL — AB (ref 65–99)
Glucose-Capillary: 156 mg/dL — ABNORMAL HIGH (ref 65–99)

## 2017-05-22 SURGERY — INSERTION, TUNNELED CENTRAL VENOUS DEVICE, WITH PORT
Anesthesia: General | Laterality: Right | Wound class: Clean

## 2017-05-22 MED ORDER — LIDOCAINE HCL (PF) 1 % IJ SOLN
INTRAMUSCULAR | Status: AC
  Filled 2017-05-22: qty 30

## 2017-05-22 MED ORDER — PROPOFOL 10 MG/ML IV BOLUS
INTRAVENOUS | Status: DC | PRN
  Administered 2017-05-22 (×3): 10 mg via INTRAVENOUS

## 2017-05-22 MED ORDER — BUPIVACAINE-EPINEPHRINE 0.5% -1:200000 IJ SOLN
INTRAMUSCULAR | Status: DC | PRN
  Administered 2017-05-22: 5 mL

## 2017-05-22 MED ORDER — HEPARIN SODIUM (PORCINE) 1000 UNIT/ML IJ SOLN
INTRAMUSCULAR | Status: DC | PRN
  Administered 2017-05-22: 20 mL via INTRAMUSCULAR

## 2017-05-22 MED ORDER — MIDAZOLAM HCL 2 MG/2ML IJ SOLN
INTRAMUSCULAR | Status: AC
  Filled 2017-05-22: qty 2

## 2017-05-22 MED ORDER — FENTANYL CITRATE (PF) 100 MCG/2ML IJ SOLN
INTRAMUSCULAR | Status: AC
  Filled 2017-05-22: qty 2

## 2017-05-22 MED ORDER — SODIUM CHLORIDE 0.9 % IJ SOLN
INTRAMUSCULAR | Status: AC
  Filled 2017-05-22: qty 50

## 2017-05-22 MED ORDER — FAMOTIDINE 20 MG PO TABS
20.0000 mg | ORAL_TABLET | Freq: Once | ORAL | Status: AC
  Administered 2017-05-22: 20 mg via ORAL

## 2017-05-22 MED ORDER — PROPOFOL 10 MG/ML IV BOLUS
INTRAVENOUS | Status: AC
  Filled 2017-05-22: qty 20

## 2017-05-22 MED ORDER — BUPIVACAINE-EPINEPHRINE (PF) 0.5% -1:200000 IJ SOLN
INTRAMUSCULAR | Status: AC
  Filled 2017-05-22: qty 30

## 2017-05-22 MED ORDER — LIDOCAINE HCL (PF) 1 % IJ SOLN
INTRAMUSCULAR | Status: DC | PRN
  Administered 2017-05-22: 4 mL

## 2017-05-22 MED ORDER — FENTANYL CITRATE (PF) 100 MCG/2ML IJ SOLN: 25.0000 ug | INTRAMUSCULAR | Status: DC | PRN

## 2017-05-22 MED ORDER — ONDANSETRON HCL 4 MG/2ML IJ SOLN
INTRAMUSCULAR | Status: AC
  Filled 2017-05-22: qty 2

## 2017-05-22 MED ORDER — ONDANSETRON HCL 4 MG/2ML IJ SOLN
INTRAMUSCULAR | Status: DC | PRN
  Administered 2017-05-22: 4 mg via INTRAVENOUS

## 2017-05-22 MED ORDER — SODIUM CHLORIDE 0.9 % IV SOLN
INTRAVENOUS | Status: DC
  Administered 2017-05-22: 10:00:00 via INTRAVENOUS

## 2017-05-22 MED ORDER — ONDANSETRON HCL 4 MG/2ML IJ SOLN: 4.0000 mg | Freq: Once | INTRAMUSCULAR | Status: DC | PRN

## 2017-05-22 MED ORDER — MIDAZOLAM HCL 2 MG/2ML IJ SOLN
INTRAMUSCULAR | Status: DC | PRN
  Administered 2017-05-22 (×2): 1 mg via INTRAVENOUS

## 2017-05-22 MED ORDER — HEPARIN SODIUM (PORCINE) 5000 UNIT/ML IJ SOLN
INTRAMUSCULAR | Status: AC
  Filled 2017-05-22: qty 1

## 2017-05-22 MED ORDER — DEXAMETHASONE SODIUM PHOSPHATE 10 MG/ML IJ SOLN
INTRAMUSCULAR | Status: AC
  Filled 2017-05-22: qty 1

## 2017-05-22 MED ORDER — FENTANYL CITRATE (PF) 100 MCG/2ML IJ SOLN
INTRAMUSCULAR | Status: DC | PRN
  Administered 2017-05-22 (×2): 50 ug via INTRAVENOUS

## 2017-05-22 MED ORDER — CEFAZOLIN SODIUM-DEXTROSE 2-4 GM/100ML-% IV SOLN
INTRAVENOUS | Status: AC
  Filled 2017-05-22: qty 100

## 2017-05-22 MED ORDER — FAMOTIDINE 20 MG PO TABS
ORAL_TABLET | ORAL | Status: AC
Start: 2017-05-22 — End: 2017-05-22
  Administered 2017-05-22: 20 mg via ORAL
  Filled 2017-05-22: qty 1

## 2017-05-22 MED ORDER — LIDOCAINE HCL (PF) 2 % IJ SOLN
INTRAMUSCULAR | Status: AC
  Filled 2017-05-22: qty 2

## 2017-05-22 SURGICAL SUPPLY — 23 items
CANISTER SUCT 1200ML W/VALVE (MISCELLANEOUS) ×3 IMPLANT
CHLORAPREP W/TINT 26ML (MISCELLANEOUS) ×3 IMPLANT
COVER LIGHT HANDLE STERIS (MISCELLANEOUS) ×6 IMPLANT
DERMABOND ADVANCED (GAUZE/BANDAGES/DRESSINGS) ×2
DERMABOND ADVANCED .7 DNX12 (GAUZE/BANDAGES/DRESSINGS) ×1 IMPLANT
DRAPE C-ARM XRAY 36X54 (DRAPES) IMPLANT
ELECT REM PT RETURN 9FT ADLT (ELECTROSURGICAL) ×3
ELECTRODE REM PT RTRN 9FT ADLT (ELECTROSURGICAL) ×1 IMPLANT
GLOVE BIO SURGEON STRL SZ7.5 (GLOVE) ×15 IMPLANT
GOWN STRL REUS W/ TWL LRG LVL3 (GOWN DISPOSABLE) ×3 IMPLANT
GOWN STRL REUS W/TWL LRG LVL3 (GOWN DISPOSABLE) ×6
KIT PORT POWER 8FR ISP CVUE (Miscellaneous) ×3 IMPLANT
KIT RM TURNOVER STRD PROC AR (KITS) ×3 IMPLANT
LABEL OR SOLS (LABEL) ×3 IMPLANT
NEEDLE FILTER BLUNT 18X 1/2SAF (NEEDLE) ×2
NEEDLE FILTER BLUNT 18X1 1/2 (NEEDLE) ×1 IMPLANT
PACK PORT-A-CATH (MISCELLANEOUS) ×3 IMPLANT
SUT MNCRL+ 5-0 UNDYED PC-3 (SUTURE) ×1 IMPLANT
SUT MONOCRYL 5-0 (SUTURE) ×2
SUT SILK 4 0 SH (SUTURE) ×3 IMPLANT
SUT VIC AB 5-0 RB1 27 (SUTURE) ×3 IMPLANT
SYR 3ML LL SCALE MARK (SYRINGE) ×3 IMPLANT
SYRINGE 10CC LL (SYRINGE) ×3 IMPLANT

## 2017-05-22 NOTE — Consult Note (Signed)
  She reports no change in overall condition since the office visit.  She comes in today prepared for insertion of central venous catheter with subcutaneous infusion port in anticipation of chemotherapy for breast cancer. The breast cancer was on the left side. The plan is to put the port on the right side. The right side was marked YES.  Lab work was reviewed.  I discussed the plan for surgery.

## 2017-05-22 NOTE — Discharge Instructions (Addendum)
Take Tylenol if needed for pain.  May shower and blot dry.  The glue will likely come off within 1 or 2 weeks.  Resume usual activities as tolerated.  Follow-up in the surgery office in about 20 weeks at the end of the course of chemotherapy.  AMBULATORY SURGERY  DISCHARGE INSTRUCTIONS   1) The drugs that you were given will stay in your system until tomorrow so for the next 24 hours you should not:  A) Drive an automobile B) Make any legal decisions C) Drink any alcoholic beverage   2) You may resume regular meals tomorrow.  Today it is better to start with liquids and gradually work up to solid foods.  You may eat anything you prefer, but it is better to start with liquids, then soup and crackers, and gradually work up to solid foods.   3) Please notify your doctor immediately if you have any unusual bleeding, trouble breathing, redness and pain at the surgery site, drainage, fever, or pain not relieved by medication.    4) Additional Instructions:   Please contact your physician with any problems or Same Day Surgery at [phone], Monday through Friday 6 am to 4 pm, or [place] at [facility] number at [phone].

## 2017-05-22 NOTE — Transfer of Care (Signed)
Immediate Anesthesia Transfer of Care Note  Patient: Dominique Guzman  Procedure(s) Performed: Procedure(s): INSERTION PORT-A-CATH (Right)  Patient Location: PACU  Anesthesia Type:MAC  Level of Consciousness: awake and alert   Airway & Oxygen Therapy: Patient Spontanous Breathing  Post-op Assessment: Report given to RN and Post -op Vital signs reviewed and stable  Post vital signs: Reviewed  Last Vitals:  Vitals:   05/22/17 0952 05/22/17 1149  BP: 136/68 126/64  Pulse: 66 68  Resp: 16 15  Temp: 36.7 C (!) 36.3 C  SpO2: 100% 100%    Last Pain:  Vitals:   05/22/17 0952  TempSrc: Oral         Complications: No apparent anesthesia complications

## 2017-05-22 NOTE — Anesthesia Procedure Notes (Signed)
Date/Time: 05/22/2017 10:35 AM Performed by: Johnna Acosta Pre-anesthesia Checklist: Patient identified, Emergency Drugs available, Suction available, Patient being monitored and Timeout performed Patient Re-evaluated:Patient Re-evaluated prior to induction Oxygen Delivery Method: Simple face mask Preoxygenation: Pre-oxygenation with 100% oxygen

## 2017-05-22 NOTE — Anesthesia Postprocedure Evaluation (Signed)
Anesthesia Post Note  Patient: Dominique Guzman  Procedure(s) Performed: Procedure(s) (LRB): INSERTION PORT-A-CATH (Right)  Patient location during evaluation: PACU Anesthesia Type: General Level of consciousness: awake and alert Pain management: pain level controlled Vital Signs Assessment: post-procedure vital signs reviewed and stable Respiratory status: spontaneous breathing and respiratory function stable Cardiovascular status: stable Anesthetic complications: no     Last Vitals:  Vitals:   05/22/17 0952 05/22/17 1149  BP: 136/68 126/64  Pulse: 66 68  Resp: 16 15  Temp: 36.7 C (!) 36.3 C  SpO2: 100% 100%    Last Pain:  Vitals:   05/22/17 0952  TempSrc: Oral                 Kenith Trickel K

## 2017-05-22 NOTE — Op Note (Signed)
OPERATIVE REPORT  PREOPERATIVE  DIAGNOSIS: . Breast cancer  POSTOPERATIVE DIAGNOSIS: . Breast cancer  PROCEDURE: . Insertion central venous catheter with subcutaneous infusion port    ANESTHESIA:  General  SURGEON: Rochel Brome  MD   INDICATIONS: . She had recent findings of left breast cancer now being central venous access for chemotherapy.  With the patient on the operating table in the supine position she was monitored by anesthesia staff and sedated. A rolled sheet was placed behind her shoulder blades so that the neck was extended. The right jugular vein was identified with ultrasound. The neck and right anterior chest wall were prepared with ChloraPrep and draped in a sterile manner. The skin beneath the clavicle was infiltrated with 1% Xylocaine. A transversely oriented 3 cm incision was made and carried down through subcutaneous tissues. Electrocautery was used for hemostasis and a subcutaneous pouch was created large enough to admit the ClearView port. The ultrasound placed into a sterile sleeve was used to further localize the jugular vein and also identified the carotid artery. The skin overlying the jugular vein was infiltrated with 1% Xylocaine. A lancing 5 mm incision was made. With the patient in the Trendelenburg position the jugular vein was cannulated with a needle. A guidewire was inserted. An ultrasound image was saved for the paper chart. Fluoroscopy was used to demonstrate the location of the guidewire in the superior vena cava. The needle was removed. The dilator and introducer sheath were advanced over the guidewire. The guidewire and dilator were removed. The catheter was passed down through the sheath into the central circulation. The sheath was peeled away. Fluoroscopy was used to position the tip of the catheter within the superior vena cava at a distance 13 cm from the skin incision. An image was saved. The catheter was then tunneled to the subclavian incision and  pressure held over the tunnel site. The catheter was cut to fit and attached to the ClearView port using the accompanying sleeve. The port was placed into the subcutaneous pouch and sutured to the deep fascia with 4-0 silk. The port was accessed with a Huber needle and aspirated a trace of blood and flushed with saline. Subcuticular tissues were then infiltrated with half percent Sensorcaine with epinephrine. Subcutaneous tissues were closed with interrupted 5-0 Vicryl. The skin was closed with 4-0 Monocryl subcuticular suture and Dermabond  The patient tolerated surgery satisfactorily and was then prepared for transfer to the recovery room  [employer].D.

## 2017-05-22 NOTE — Anesthesia Post-op Follow-up Note (Signed)
Anesthesia QCDR form completed.        

## 2017-05-22 NOTE — Anesthesia Preprocedure Evaluation (Signed)
Anesthesia Evaluation  Patient identified by MRN, date of birth, ID band Patient awake    Reviewed: Allergy & Precautions, NPO status , Patient's Chart, lab work & pertinent test results  History of Anesthesia Complications Negative for: history of anesthetic complications  Airway Mallampati: II       Dental   Pulmonary neg COPD,           Cardiovascular hypertension, Pt. on medications (-) Past MI and (-) CHF (-) dysrhythmias (-) Valvular Problems/Murmurs     Neuro/Psych neg Seizures    GI/Hepatic Neg liver ROS, neg GERD  ,  Endo/Other  diabetes, Type 2, Oral Hypoglycemic Agents  Renal/GU negative Renal ROS     Musculoskeletal   Abdominal   Peds  Hematology   Anesthesia Other Findings   Reproductive/Obstetrics                             Anesthesia Physical Anesthesia Plan  ASA: III  Anesthesia Plan: General   Post-op Pain Management:    Induction: Intravenous  PONV Risk Score and Plan:   Airway Management Planned: Nasal Cannula  Additional Equipment:   Intra-op Plan:   Post-operative Plan:   Informed Consent: I have reviewed the patients History and Physical, chart, labs and discussed the procedure including the risks, benefits and alternatives for the proposed anesthesia with the patient or authorized representative who has indicated his/her understanding and acceptance.     Plan Discussed with:   Anesthesia Plan Comments:         Anesthesia Quick Evaluation

## 2017-05-23 ENCOUNTER — Telehealth: Payer: Self-pay | Admitting: Oncology

## 2017-05-23 ENCOUNTER — Other Ambulatory Visit: Payer: BLUE CROSS/BLUE SHIELD

## 2017-05-23 ENCOUNTER — Encounter: Payer: Self-pay | Admitting: Oncology

## 2017-05-23 ENCOUNTER — Inpatient Hospital Stay: Payer: BLUE CROSS/BLUE SHIELD

## 2017-05-23 ENCOUNTER — Inpatient Hospital Stay (HOSPITAL_BASED_OUTPATIENT_CLINIC_OR_DEPARTMENT_OTHER): Payer: BLUE CROSS/BLUE SHIELD | Admitting: Oncology

## 2017-05-23 ENCOUNTER — Other Ambulatory Visit: Payer: Self-pay | Admitting: *Deleted

## 2017-05-23 VITALS — BP 130/76 | HR 75 | Temp 98.9°F | Wt 190.3 lb

## 2017-05-23 DIAGNOSIS — Z7984 Long term (current) use of oral hypoglycemic drugs: Secondary | ICD-10-CM | POA: Diagnosis not present

## 2017-05-23 DIAGNOSIS — Z170 Estrogen receptor positive status [ER+]: Secondary | ICD-10-CM

## 2017-05-23 DIAGNOSIS — Z8542 Personal history of malignant neoplasm of other parts of uterus: Secondary | ICD-10-CM

## 2017-05-23 DIAGNOSIS — C50512 Malignant neoplasm of lower-outer quadrant of left female breast: Secondary | ICD-10-CM

## 2017-05-23 DIAGNOSIS — M8580 Other specified disorders of bone density and structure, unspecified site: Secondary | ICD-10-CM

## 2017-05-23 DIAGNOSIS — I10 Essential (primary) hypertension: Secondary | ICD-10-CM | POA: Diagnosis not present

## 2017-05-23 DIAGNOSIS — E119 Type 2 diabetes mellitus without complications: Secondary | ICD-10-CM

## 2017-05-23 DIAGNOSIS — F419 Anxiety disorder, unspecified: Secondary | ICD-10-CM | POA: Diagnosis not present

## 2017-05-23 DIAGNOSIS — D649 Anemia, unspecified: Secondary | ICD-10-CM | POA: Diagnosis not present

## 2017-05-23 DIAGNOSIS — Z7689 Persons encountering health services in other specified circumstances: Secondary | ICD-10-CM

## 2017-05-23 DIAGNOSIS — G4730 Sleep apnea, unspecified: Secondary | ICD-10-CM | POA: Diagnosis not present

## 2017-05-23 DIAGNOSIS — Z79899 Other long term (current) drug therapy: Secondary | ICD-10-CM

## 2017-05-23 DIAGNOSIS — C50412 Malignant neoplasm of upper-outer quadrant of left female breast: Secondary | ICD-10-CM

## 2017-05-23 DIAGNOSIS — Z923 Personal history of irradiation: Secondary | ICD-10-CM | POA: Diagnosis not present

## 2017-05-23 LAB — COMPREHENSIVE METABOLIC PANEL
ALBUMIN: 3.9 g/dL (ref 3.5–5.0)
ALT: 12 U/L — AB (ref 14–54)
AST: 17 U/L (ref 15–41)
Alkaline Phosphatase: 83 U/L (ref 38–126)
Anion gap: 8 (ref 5–15)
BUN: 17 mg/dL (ref 6–20)
CHLORIDE: 104 mmol/L (ref 101–111)
CO2: 27 mmol/L (ref 22–32)
CREATININE: 0.71 mg/dL (ref 0.44–1.00)
Calcium: 9.3 mg/dL (ref 8.9–10.3)
GFR calc non Af Amer: >60 mL/min (ref >60)
Glucose, Bld: 166 mg/dL — ABNORMAL HIGH (ref 65–99)
Potassium: 4.1 mmol/L (ref 3.5–5.1)
SODIUM: 139 mmol/L (ref 135–145)
Total Bilirubin: 0.5 mg/dL (ref 0.3–1.2)
Total Protein: 6.9 g/dL (ref 6.5–8.1)

## 2017-05-23 LAB — CBC WITH DIFFERENTIAL/PLATELET
BASOS ABS: 0.1 10*3/uL (ref 0–0.1)
BASOS PCT: 1 %
EOS ABS: 0.2 10*3/uL (ref 0–0.7)
EOS PCT: 3 %
HCT: 36.1 % (ref 35.0–47.0)
Hemoglobin: 12.7 g/dL (ref 12.0–16.0)
Lymphocytes Relative: 20 %
Lymphs Abs: 1.6 10*3/uL (ref 1.0–3.6)
MCH: 28.6 pg (ref 26.0–34.0)
MCHC: 35.2 g/dL (ref 32.0–36.0)
MCV: 81.2 fL (ref 80.0–100.0)
Monocytes Absolute: 0.6 10*3/uL (ref 0.2–0.9)
Monocytes Relative: 8 %
Neutro Abs: 5.4 10*3/uL (ref 1.4–6.5)
Neutrophils Relative %: 68 %
PLATELETS: 349 10*3/uL (ref 150–440)
RBC: 4.44 MIL/uL (ref 3.80–5.20)
RDW: 13.5 % (ref 11.5–14.5)
WBC: 8 10*3/uL (ref 3.6–11.0)

## 2017-05-23 LAB — URINALYSIS, COMPLETE (UACMP) WITH MICROSCOPIC
Bilirubin Urine: NEGATIVE
GLUCOSE, UA: NEGATIVE mg/dL
Hgb urine dipstick: NEGATIVE
Ketones, ur: NEGATIVE mg/dL
Leukocytes, UA: NEGATIVE
Nitrite: NEGATIVE
PROTEIN: NEGATIVE mg/dL
RBC / HPF: NONE SEEN RBC/hpf (ref 0–5)
SPECIFIC GRAVITY, URINE: 1.017 (ref 1.005–1.030)
pH: 7 (ref 5.0–8.0)

## 2017-05-23 LAB — TSH: TSH: 0.632 u[IU]/mL (ref 0.350–4.500)

## 2017-05-23 LAB — LIPID PANEL
CHOL/HDL RATIO: 2.7 ratio
Cholesterol: 169 mg/dL (ref 0–200)
HDL: 62 mg/dL (ref >40)
LDL Cholesterol: 98 mg/dL (ref 0–99)
Triglycerides: 45 mg/dL (ref <150)
VLDL: 9 mg/dL (ref 0–40)

## 2017-05-23 MED ORDER — SODIUM CHLORIDE 0.9 % IV SOLN
600.0000 mg/m2 | Freq: Once | INTRAVENOUS | Status: AC
  Administered 2017-05-23: 1120 mg via INTRAVENOUS
  Filled 2017-05-23: qty 50

## 2017-05-23 MED ORDER — SODIUM CHLORIDE 0.9 % IV SOLN
Freq: Once | INTRAVENOUS | Status: AC
Start: 2017-05-23 — End: 2017-05-23
  Administered 2017-05-23: 10:00:00 via INTRAVENOUS
  Filled 2017-05-23: qty 1000

## 2017-05-23 MED ORDER — DOXORUBICIN HCL CHEMO IV INJECTION 2 MG/ML
60.0000 mg/m2 | Freq: Once | INTRAVENOUS | Status: AC
  Administered 2017-05-23: 112 mg via INTRAVENOUS
  Filled 2017-05-23: qty 56

## 2017-05-23 MED ORDER — PALONOSETRON HCL INJECTION 0.25 MG/5ML
0.2500 mg | Freq: Once | INTRAVENOUS | Status: AC
  Administered 2017-05-23: 0.25 mg via INTRAVENOUS
  Filled 2017-05-23: qty 5

## 2017-05-23 MED ORDER — SODIUM CHLORIDE 0.9% FLUSH
10.0000 mL | INTRAVENOUS | Status: DC | PRN
Start: 2017-05-23 — End: 2017-05-23
  Administered 2017-05-23: 10 mL
  Filled 2017-05-23: qty 10

## 2017-05-23 MED ORDER — HEPARIN SOD (PORK) LOCK FLUSH 100 UNIT/ML IV SOLN
500.0000 [IU] | Freq: Once | INTRAVENOUS | Status: AC | PRN
  Administered 2017-05-23: 500 [IU]
  Filled 2017-05-23: qty 5

## 2017-05-23 MED ORDER — PEGFILGRASTIM 6 MG/0.6ML ~~LOC~~ PSKT
6.0000 mg | PREFILLED_SYRINGE | Freq: Once | SUBCUTANEOUS | Status: AC
  Administered 2017-05-23: 6 mg via SUBCUTANEOUS
  Filled 2017-05-23: qty 0.6

## 2017-05-23 MED ORDER — SODIUM CHLORIDE 0.9 % IV SOLN
Freq: Once | INTRAVENOUS | Status: AC
  Administered 2017-05-23: 10:00:00 via INTRAVENOUS
  Filled 2017-05-23: qty 5

## 2017-05-23 NOTE — Progress Notes (Signed)
[facility] Follow Up Visit.  Patient Care Team: Glendon Axe, MD as PCP - General (Internal Medicine)  CHIEF COMPLAINTS/PURPOSE OF CONSULTATION: History of Uterine Cancer, treated with radiation.   HISTORY OF PRESENTING ILLNESS: Dominique Guzman 66 y.o. female with PMH listed as below is referred by Dr.Schermerhorn to [name] for evaluation and management of newly diagnosed breast cancer. Patient had a history a left breast mass biopsy followed by breast excisonal biopsy in 2015 with benign pathology.  She also has a history of Uterine cancer which was treated with RT with Dr.Chrystal.  She underwent routine screening mammogram on 04/17/2017 which recommended a possible mass on the left breast. Left diagnostic mammogram showed a highly suspicious left breast mass at 4 o'clock, 7 cm from the nipple and 1 o'clock,,  About 3 cm from the nipple. Enlarged left axillary lymph node suspicious for metastatic disease.  1 US breast confirmed a left breast mass of 2.1cm, 4 o'clock, and 1.1 cm mass at 1 o'clock.The masses at 1 and 4 o'clock are separated by approximately 4 cm sonographically 2 Biopsy of the two mass and axillary lymph node revealed invasive carcinoma, grade 2. Both are ER,PR positive. HER2 equivocal, FISH pending. 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  3 05/14/2017 MRI breast showed Two sites of biopsy proven malignancy seen in the left breast, which span up to 11.3 cm. There are intervening areas of non mass enhancement and a small suspicious enhancing mass. 4 Case was discussed on breast tumor board on 05/21/2017 and the panel agree with neoadjuvant ddAC to T followed by surgery.   INTERVAL HISTORY She comes for evaluation before Cycle 1 ddAC. Port was placed. There is some concern about T wave inversion on her pre op EKG so she was sent to Internal Medicine for surgical clearance and was cleared. She does not have any cardiac symptoms or breath issue.   She  has no complaints today  Review of Systems  Constitutional: Negative.   HENT:  Negative.   Eyes: Negative.   Respiratory: Negative.   Cardiovascular: Negative.   Gastrointestinal: Negative.   Endocrine: Negative.   Genitourinary: Negative.    Musculoskeletal: Negative.   Skin: Negative.     MEDICAL HISTORY: Past Medical History:  Diagnosis Date  . Anemia   . Arthritis    HIP-LEFT  . Benign neoplasm of colon   . Cancer ([place])   . Diabetes mellitus without complication ([place])   . H/O osteopenia   . Hypertension   . Sleep apnea    USE CPAP  . Uterine cancer [facility])     SURGICAL HISTORY: Past Surgical History:  Procedure Laterality Date  . ABDOMINAL HYSTERECTOMY    . BREAST BIOPSY Left 04/2014   negative  . BREAST EXCISIONAL BIOPSY Left 2015  . CESAREAN SECTION     X2  . COLONOSCOPY WITH PROPOFOL N/A 04/15/2015   Procedure: COLONOSCOPY WITH PROPOFOL;  Surgeon: Hulen Luster, MD;  Location: [facility] ENDOSCOPY;  Service: Gastroenterology;  Laterality: N/A;  . HERNIA REPAIR    . PORTACATH PLACEMENT Right 05/22/2017   Procedure: INSERTION PORT-A-CATH;  Surgeon: Leonie Green, MD;  Location: [facility] ORS;  Service: General;  Laterality: Right;    SOCIAL HISTORY: Social History   Social History  . Marital status: Married    Spouse name: N/A  . Number of children: N/A  . Years of education: N/A   Occupational History  . Not on file.   Social  History Main Topics  . Smoking status: Never Smoker  . Smokeless tobacco: Never Used  . Alcohol use No  . Drug use: No  . Sexual activity: Not on file   Other Topics Concern  . Not on file   Social History Narrative  . No narrative on file    FAMILY HISTORY Family History  Problem Relation Age of Onset  . Breast cancer Neg Hx     ALLERGIES:  has No Known Allergies.  MEDICATIONS:  Current Outpatient Prescriptions  Medication Sig Dispense Refill  . Calcium Carb-Cholecalciferol (CALCIUM 600-D PO) Take 1 tablet by mouth  daily.    [name] CINNAMON PO Take 2 capsules by mouth daily.    [name] FIBER PO Take 1 capsule by mouth daily.    . fluticasone (FLONASE) 50 MCG/ACT nasal spray Place 2 sprays into both nostrils as needed.     [name] glimepiride (AMARYL) 4 MG tablet Take 4 mg by mouth 2 (two) times daily.    . Glucosamine HCl (GLUCOSAMINE PO) Take 1 tablet by mouth daily.    [name] lidocaine-prilocaine (EMLA) cream Apply 1 application topically as needed. Apply small amount to port site at least 1 hour prior to it being accessed, cover with plastic wrap 30 g 3  . lisinopril-hydrochlorothiazide (PRINZIDE,ZESTORETIC) 20-12.5 MG per tablet Take 1 tablet by mouth daily.    [name] loperamide (IMODIUM) 2 MG capsule Take 1 capsule (2 mg total) by mouth See admin instructions. With onset of loose stool, take 48m followed by 246mevery 2 hours until 12 hours have passed without loose bowel movement. Maximum: 16 mg/day 120 capsule 1  . meloxicam (MOBIC) 15 MG tablet Take 15 mg by mouth daily as needed for pain.     . metFORMIN (GLUCOPHAGE) 500 MG tablet Take 1,000 mg by mouth 2 (two) times daily.     . Multiple Vitamin (MULTIVITAMIN) tablet Take 1 tablet by mouth daily.    . ondansetron (ZOFRAN) 8 MG tablet Take 1 tablet (8 mg total) by mouth every 8 (eight) hours as needed for nausea or vomiting. (Patient not taking: Reported on 05/18/2017) 120 tablet 0  . sitaGLIPtin (JANUVIA) 100 MG tablet Take 100 mg by mouth daily.     No current facility-administered medications for this visit.     PHYSICAL EXAMINATION:  ECOG PERFORMANCE STATUS: 0 - Asymptomatic  Vitals:   05/23/17 0859  BP: 130/76  Pulse: 75  Temp: 98.9 F (37.2 C)    Filed Weights   05/23/17 0859  Weight: 190 lb 5 oz (86.3 kg)     Physical Exam GENERAL: No distress, well nourished.  SKIN:  No rashes or significant lesions  HEAD: Normocephalic, No masses, lesions, tenderness or abnormalities  EYES: Conjunctiva are pink, non icteric ENT: External ears normal ,lips , buccal  mucosa, and tongue normal and mucous membranes are moist  LYMPH: No palpable cervical and axillary lymphadenopathy  LUNGS: Clear to auscultation, no crackles or wheezes  (+) Medi port right anterior chest wall HEART: Regular rate & rhythm, no murmurs, no gallops, S1 normal and S2 normal  ABDOMEN: Abdomen soft, non-tender, normal bowel sounds, I did not appreciate any  masses or organomegaly  MUSCULOSKELETAL: No CVA tenderness and no tenderness on percussion of the back or rib cage.  EXTREMITIES: No edema, no skin discoloration or tenderness NEURO: Alert & oriented, no focal motor/sensory deficits. Breast exam was performed in lying down position. No evidence of any palpable masses or lumps in the right breast. No  evidence of right axillary adenopathy. Left breast palpable axillary lymph node. + Left breast outer quadrant mass  LABORATORY DATA: No recent labs.  CBC    Component Value Date/Time   WBC 8.0 05/23/2017 0844   RBC 4.44 05/23/2017 0844   HGB 12.7 05/23/2017 0844   HCT 36.1 05/23/2017 0844   PLT 349 05/23/2017 0844   MCV 81.2 05/23/2017 0844   MCH 28.6 05/23/2017 0844   MCHC 35.2 05/23/2017 0844   RDW 13.5 05/23/2017 0844   LYMPHSABS 1.6 05/23/2017 0844   MONOABS 0.6 05/23/2017 0844   EOSABS 0.2 05/23/2017 0844   BASOSABS 0.1 05/23/2017 0844   CMP Latest Ref Rng & Units 05/23/2017 05/04/2017  Glucose 65 - 99 mg/dL 166(H) 132(H)  BUN 6 - 20 mg/dL 17 19  Creatinine 0.44 - 1.00 mg/dL 0.71 0.85  Sodium 135 - 145 mmol/L 139 137  Potassium 3.5 - 5.1 mmol/L 4.1 4.2  Chloride 101 - 111 mmol/L 104 101  CO2 22 - 32 mmol/L 27 27  Calcium 8.9 - 10.3 mg/dL 9.3 10.0  Total Protein 6.5 - 8.1 g/dL 6.9 7.2  Total Bilirubin 0.3 - 1.2 mg/dL 0.5 0.5  Alkaline Phos 38 - 126 U/L 83 84  AST 15 - 41 U/L 17 20  ALT 14 - 54 U/L 12(L) 12(L)   RADIOGRAPHIC STUDIES: I have personally reviewed the radiological images as listed and agree with the findings in the report 04/25/2017 Mammogram  Diagnostic Targeted ultrasound of the left breast demonstrates an irregular, hypoechoic mass at 1 o'clock, 3 cm from the nipple measuring 10 x 10 x 10 mm corresponding to the mass seen within the anterior upper, outer left breast. At 4 o'clock, 7 cm from the nipple, there is an irregular hypoechoic mass measuring 2.1 x 1.5 x 1.9 cm. Targeted ultrasound of the left axilla demonstrates an enlarged, abnormal axillary lymph node demonstrating cortical thickening up to 6 mm. The masses at 1 and 4 o'clock are separated by approximately 4 cm sonographically. IMPRESSION: Highly suspicious left breast masses at 4 o'clock, 7 cm from the nipple and 1 o'clock, 3 cm from the nipple. Enlarged left axillary lymph node suspicious for metastatic disease.  US breast 04/25/2017 FINDINGS: There is an irregular spiculated mass in the slightly lower, outer left breast, posterior depth measuring approximately 2.6 x 2.5 x 1.9 cm. There is an additional irregular mass within the upper, outer left breast, anterior depth measuring approximately 1 cm in size, which is suspicious for a satellite lesion. There is asymmetric density between the 2 masses suggesting contiguous spread of disease. Mammographically, the masses are separated by approximately 6 cm and in total span approximately 9.6 cm.  Mammographic images were processed with CAD.  Targeted ultrasound of the left breast demonstrates an irregular, hypoechoic mass at 1 o'clock, 3 cm from the nipple measuring 10 x 10 x 10 mm corresponding to the mass seen within the anterior upper, outer left breast. At 4 o'clock, 7 cm from the nipple, there is an irregular hypoechoic mass measuring 2.1 x 1.5 x 1.9 cm. Targeted ultrasound of the left axilla demonstrates an enlarged, abnormal axillary lymph node demonstrating cortical thickening up to 6 mm. The masses at 1 and 4 o'clock are separated by approximately 4 cm sonographically. IMPRESSION: Highly suspicious  left breast masses at 4 o'clock, 7 cm from the nipple and 1 o'clock, 3 cm from the nipple. Enlarged left axillary lymph node suspicious for metastatic disease.  Pathology DIAGNOSIS:  A. BREAST, LEFT, 4  O'CLOCK 7 CM FROM NIPPLE; ULTRASOUND-GUIDED CORE BIOPSY:  - INVASIVE MAMMARY CARCINOMA, NO SPECIAL TYPE, GRADE 2, SEE COMMENT.  - INVASIVE CARCINOMA MEASURES 15 MM IN THIS SAMPLE.  B. BREAST, LEFT, 1 O'CLOCK, 3 CM FROM NIPPLE; ULTRASOUND-GUIDED CORE  BIOPSY:  - INVASIVE MAMMARY CARCINOMA, NO SPECIAL TYPE, GRADE 2, SEE COMMENT.  - INVASIVE CARCINOMA MEASURES 10 MM IN THIS SAMPLE.  C. LYMPH NODE, LEFT AXILLA; ULTRASOUND-GUIDED CORE BIOPSY:  - METASTATIC CARCINOMA, 7 MM METASTASIS, SEE COMMENT.    BREAST BIOMARKER TEST RESULTS - TUMOR A, 4 O'CLOCK:  Estrogen Receptor (ER) Status: POSITIVE, >90% of cells with nuclear  positivity     Average intensity of staining: Strong  Progesterone Receptor (PR) Status: POSITIVE, >90% of cells with nuclear  positivity     Average intensity of staining: Strong  HER2 (by immunohistochemistry): Equivocal (score 2+)  Percentage of cells with uniform intense complete membrane staining: 1%   BREAST BIOMARKER TEST RESULTS - TUMOR B, 1 O'CLOCK:  Estrogen Receptor (ER) Status: POSITIVE, >90% of cells with nuclear  positivity     Average intensity of staining: Strong  Progesterone Receptor (PR) Status: POSITIVE, 51-90% of cells with  nuclear positivity     Average intensity of staining: Moderate to strong  HER2 (by immunohistochemistry): Equivocal (score 2+)  Percentage of cells with uniform intense complete membrane staining: 2%    ADDENDUM #2   BREAST BIOMARKER TEST: HER2 FISH - TUMOR A, 4 O'CLOCK:  HER2 (ERBB2) (by in situ hybridization): NEGATIVE (not amplified)  Number of observers: 2  Number of invasive tumor cells counted: 40  Dual probe assay  Average number of HER2 signals per cell: 3.6  Average number of CEP17 signals per cell:  2.5  HER2/CEP17 ratio: 1.42   05/14/2017 MRI breast IMPRESSION: 1. Two sites of biopsy proven malignancy seen in the left breast, which span up to 11.3 cm. There are intervening areas of non mass enhancement and a small suspicious enhancing mass. 2. The biopsy-proven metastatic left axillary lymph node is identified. There are 3 other prominent left axillary lymph nodes which are not definitely pathologically enlarged. 3.  No evidence of right breast malignancy  ASSESSMENT/PLAN Cancer Staging Malignant neoplasm of lower-outer quadrant of left breast of female, estrogen receptor positive ([place]) Staging form: Breast, AJCC 8th Edition - Clinical stage from 04/25/2017: Stage IIA (cT3(m), cN1(f), cM0, G2, ER: Positive, PR: Positive, HER2: Equivocal) - Signed by Earlie Server, MD on 05/23/2017  Malignant neoplasm of upper-outer quadrant of left breast in female, estrogen receptor positive ([place]) Staging form: Breast, AJCC 8th Edition - Clinical: No stage assigned - Unsigned  1. Malignant neoplasm of upper-outer quadrant of left breast in female, estrogen receptor positive ([place])    # Multifocal left breast cancer, On MRI the mass spans about 11.3 cm area, which gives cT3N1M0, one of the mass is associated with LVI and DCIS.  Given the multifocal feature she will need mastectomy. I recommend new adjuvant chemotherapy with ddAC--T weekly , Hopefully to convert patient's lymph node status to negative, only needing sentinel lymph node sampling instead of ALND. Discussed with patient that close monitor how she tolerates the chemotherapy.  #Growth factor-Neulasta would be given as prophylaxis for chemotherapy-induced neutropenia to prevent febrile neutropenias. Discussed potential side effect- myalgias/arthralgias # ddAC has been shown in the studies to be superior comparing to regular AC, however toxicity is higher. If she can not tolerate dose dense regimen, will change to [facility] every 3 weeks.   #Patient and her  husband asked about the staging. I informed patient that for now as the clinical staging she is stage II breast cancer, ER/PR positive HER-2 negative by fish. However she has multifocal diseases, her final pathology from the surgery may change her staging.   # Patient has antiemesis and antidiarrhea medication.  #OK to proceed cycle 1 ddAC today. Onpro today after chemotherapy.  # CBC, CMP Prior to each treatment cycles.  I will see her on cycle 2 day 1 06/06/2017  Orders Placed This Encounter  Procedures  . Comprehensive metabolic panel    Standing Status:   Standing    Number of Occurrences:   10    Standing Expiration Date:   05/23/2018  . CBC with Differential/Platelet    Standing Status:   Standing    Number of Occurrences:   10    Standing Expiration Date:   05/23/2018    All questions were answered. The patient knows to call the clinic with any problems, questions or concerns.    Earlie Server, MD  05/23/2017 8:50 AM

## 2017-05-23 NOTE — Telephone Encounter (Signed)
Per 05/23/17 los,  ADRIAMYCIN / CYTOXAN + NEULASTA ONPRO KIT for 05/23/17 visit.

## 2017-05-23 NOTE — Progress Notes (Signed)
Patient here today for follow up.   

## 2017-05-24 LAB — MISC LABCORP TEST (SEND OUT): Labcorp test code: 1453

## 2017-05-24 LAB — MICROALBUMIN / CREATININE URINE RATIO
CREATININE, UR: 66 mg/dL
Microalb, Ur: <3 ug/mL — ABNORMAL HIGH

## 2017-05-25 ENCOUNTER — Inpatient Hospital Stay: Payer: BLUE CROSS/BLUE SHIELD

## 2017-06-06 ENCOUNTER — Inpatient Hospital Stay: Payer: BLUE CROSS/BLUE SHIELD

## 2017-06-06 ENCOUNTER — Encounter: Payer: Self-pay | Admitting: Oncology

## 2017-06-06 ENCOUNTER — Inpatient Hospital Stay (HOSPITAL_BASED_OUTPATIENT_CLINIC_OR_DEPARTMENT_OTHER): Payer: BLUE CROSS/BLUE SHIELD | Admitting: Oncology

## 2017-06-06 VITALS — BP 123/82 | HR 80 | Temp 98.1°F | Resp 16 | Ht 58.5 in | Wt 188.3 lb

## 2017-06-06 DIAGNOSIS — E119 Type 2 diabetes mellitus without complications: Secondary | ICD-10-CM

## 2017-06-06 DIAGNOSIS — M8580 Other specified disorders of bone density and structure, unspecified site: Secondary | ICD-10-CM | POA: Diagnosis not present

## 2017-06-06 DIAGNOSIS — D649 Anemia, unspecified: Secondary | ICD-10-CM

## 2017-06-06 DIAGNOSIS — Z7689 Persons encountering health services in other specified circumstances: Secondary | ICD-10-CM | POA: Diagnosis not present

## 2017-06-06 DIAGNOSIS — Z79899 Other long term (current) drug therapy: Secondary | ICD-10-CM

## 2017-06-06 DIAGNOSIS — Z170 Estrogen receptor positive status [ER+]: Secondary | ICD-10-CM

## 2017-06-06 DIAGNOSIS — K1230 Oral mucositis (ulcerative), unspecified: Secondary | ICD-10-CM

## 2017-06-06 DIAGNOSIS — C773 Secondary and unspecified malignant neoplasm of axilla and upper limb lymph nodes: Secondary | ICD-10-CM

## 2017-06-06 DIAGNOSIS — Z8542 Personal history of malignant neoplasm of other parts of uterus: Secondary | ICD-10-CM

## 2017-06-06 DIAGNOSIS — Z923 Personal history of irradiation: Secondary | ICD-10-CM

## 2017-06-06 DIAGNOSIS — C50912 Malignant neoplasm of unspecified site of left female breast: Secondary | ICD-10-CM

## 2017-06-06 DIAGNOSIS — Z5111 Encounter for antineoplastic chemotherapy: Secondary | ICD-10-CM

## 2017-06-06 DIAGNOSIS — Z7984 Long term (current) use of oral hypoglycemic drugs: Secondary | ICD-10-CM

## 2017-06-06 DIAGNOSIS — I10 Essential (primary) hypertension: Secondary | ICD-10-CM

## 2017-06-06 DIAGNOSIS — C50512 Malignant neoplasm of lower-outer quadrant of left female breast: Secondary | ICD-10-CM

## 2017-06-06 DIAGNOSIS — F419 Anxiety disorder, unspecified: Secondary | ICD-10-CM | POA: Diagnosis not present

## 2017-06-06 DIAGNOSIS — C50412 Malignant neoplasm of upper-outer quadrant of left female breast: Secondary | ICD-10-CM

## 2017-06-06 DIAGNOSIS — G4730 Sleep apnea, unspecified: Secondary | ICD-10-CM

## 2017-06-06 LAB — COMPREHENSIVE METABOLIC PANEL
ALBUMIN: 3.9 g/dL (ref 3.5–5.0)
ALK PHOS: 89 U/L (ref 38–126)
ALT: 14 U/L (ref 14–54)
AST: 21 U/L (ref 15–41)
Anion gap: 7 (ref 5–15)
BUN: 14 mg/dL (ref 6–20)
CO2: 28 mmol/L (ref 22–32)
CREATININE: 0.67 mg/dL (ref 0.44–1.00)
Calcium: 9.6 mg/dL (ref 8.9–10.3)
Chloride: 101 mmol/L (ref 101–111)
GFR calc Af Amer: >60 mL/min (ref >60)
GLUCOSE: 159 mg/dL — AB (ref 65–99)
Potassium: 4.1 mmol/L (ref 3.5–5.1)
Sodium: 136 mmol/L (ref 135–145)
Total Bilirubin: 0.3 mg/dL (ref 0.3–1.2)
Total Protein: 7 g/dL (ref 6.5–8.1)

## 2017-06-06 LAB — CBC WITH DIFFERENTIAL/PLATELET
BASOS ABS: 0.1 10*3/uL (ref 0–0.1)
Basophils Relative: 0 %
EOS ABS: 0.1 10*3/uL (ref 0–0.7)
EOS PCT: 0 %
HCT: 34.8 % — ABNORMAL LOW (ref 35.0–47.0)
Hemoglobin: 12.2 g/dL (ref 12.0–16.0)
LYMPHS PCT: 10 %
Lymphs Abs: 1.7 10*3/uL (ref 1.0–3.6)
MCH: 28 pg (ref 26.0–34.0)
MCHC: 35 g/dL (ref 32.0–36.0)
MCV: 80.1 fL (ref 80.0–100.0)
MONO ABS: 1.5 10*3/uL — AB (ref 0.2–0.9)
Monocytes Relative: 10 %
Neutro Abs: 12.8 10*3/uL — ABNORMAL HIGH (ref 1.4–6.5)
Neutrophils Relative %: 80 %
PLATELETS: 334 10*3/uL (ref 150–440)
RBC: 4.34 MIL/uL (ref 3.80–5.20)
RDW: 13.5 % (ref 11.5–14.5)
WBC: 16.2 10*3/uL — ABNORMAL HIGH (ref 3.6–11.0)

## 2017-06-06 MED ORDER — SODIUM CHLORIDE 0.9 % IV SOLN
600.0000 mg/m2 | Freq: Once | INTRAVENOUS | Status: AC
  Administered 2017-06-06: 1120 mg via INTRAVENOUS
  Filled 2017-06-06: qty 50

## 2017-06-06 MED ORDER — SODIUM CHLORIDE 0.9 % IV SOLN
Freq: Once | INTRAVENOUS | Status: AC
  Administered 2017-06-06: 10:00:00 via INTRAVENOUS
  Filled 2017-06-06: qty 1000

## 2017-06-06 MED ORDER — PALONOSETRON HCL INJECTION 0.25 MG/5ML
0.2500 mg | Freq: Once | INTRAVENOUS | Status: AC
  Administered 2017-06-06: 0.25 mg via INTRAVENOUS
  Filled 2017-06-06: qty 5

## 2017-06-06 MED ORDER — SODIUM CHLORIDE 0.9% FLUSH
10.0000 mL | Freq: Once | INTRAVENOUS | Status: AC
  Administered 2017-06-06: 10 mL via INTRAVENOUS
  Filled 2017-06-06: qty 10

## 2017-06-06 MED ORDER — SODIUM CHLORIDE 0.9 % IV SOLN
Freq: Once | INTRAVENOUS | Status: AC
  Administered 2017-06-06: 10:00:00 via INTRAVENOUS
  Filled 2017-06-06: qty 5

## 2017-06-06 MED ORDER — HEPARIN SOD (PORK) LOCK FLUSH 100 UNIT/ML IV SOLN
500.0000 [IU] | Freq: Once | INTRAVENOUS | Status: AC
  Administered 2017-06-06: 500 [IU] via INTRAVENOUS
  Filled 2017-06-06: qty 5

## 2017-06-06 MED ORDER — PEGFILGRASTIM 6 MG/0.6ML ~~LOC~~ PSKT
6.0000 mg | PREFILLED_SYRINGE | Freq: Once | SUBCUTANEOUS | Status: AC
  Administered 2017-06-06: 6 mg via SUBCUTANEOUS
  Filled 2017-06-06: qty 0.6

## 2017-06-06 MED ORDER — DOXORUBICIN HCL CHEMO IV INJECTION 2 MG/ML
60.0000 mg/m2 | Freq: Once | INTRAVENOUS | Status: AC
  Administered 2017-06-06: 112 mg via INTRAVENOUS
  Filled 2017-06-06: qty 56

## 2017-06-06 NOTE — Progress Notes (Signed)
Patient here for pre treatment  Check. No changes since last appointment

## 2017-06-06 NOTE — Progress Notes (Signed)
[facility] [facility] Follow Up Visit.  Patient Care Team: Glendon Axe, MD as PCP - General (Internal Medicine)  CHIEF COMPLAINTS/PURPOSE OF CONSULTATION: History of Uterine Cancer, treated with radiation.   HISTORY OF PRESENTING ILLNESS: Dominique Guzman 66 y.o. female with PMH listed as below is referred by Dr.Schermerhorn to [name] for evaluation and management of newly diagnosed breast cancer. Patient had a history a left breast mass biopsy followed by breast excisonal biopsy in 2015 with benign pathology.  She also has a history of Uterine cancer which was treated with RT with Dr.Chrystal.  She underwent routine screening mammogram on 04/17/2017 which recommended a possible mass on the left breast. Left diagnostic mammogram showed a highly suspicious left breast mass at 4 o'clock, 7 cm from the nipple and 1 o'clock,,  About 3 cm from the nipple. Enlarged left axillary lymph node suspicious for metastatic disease.  1 US breast confirmed a left breast mass of 2.1cm, 4 o'clock, and 1.1 cm mass at 1 o'clock.The masses at 1 and 4 o'clock are separated by approximately 4 cm sonographically 2 Biopsy of the two mass and axillary lymph node revealed invasive carcinoma, grade 2. Both are ER,PR positive. HER2 equivocal, FISH pending. 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  3 05/14/2017 MRI breast showed Two sites of biopsy proven malignancy seen in the left breast, which span up to 11.3 cm. There are intervening areas of non mass enhancement and a small suspicious enhancing mass. 4 Case was discussed on breast tumor board on 05/21/2017 and the panel agree with neoadjuvant ddAC to T followed by surgery.  5 Started on ddAC on 05/23/2017.   INTERVAL HISTORY She comes for evaluation before Cycle 2 ddAC. She tolerate first cycle of AC well. She denies any nausea and has not needed antiemetics. Denies fever, SOB, chest pain, lower extremity swelling. She reports a small mouth sore on the  left side.   Review of Systems  Constitutional: Negative.   HENT:  Negative.   Eyes: Negative.   Respiratory: Negative.   Cardiovascular: Negative.   Gastrointestinal: Negative.   Endocrine: Negative.   Genitourinary: Negative.    Musculoskeletal: Negative.   Skin: Negative.     MEDICAL HISTORY: Past Medical History:  Diagnosis Date  . Anemia   . Arthritis    HIP-LEFT  . Benign neoplasm of colon   . Cancer ([place])   . Diabetes mellitus without complication ([place])   . H/O osteopenia   . Hypertension   . Sleep apnea    USE CPAP  . Uterine cancer [facility])     SURGICAL HISTORY: Past Surgical History:  Procedure Laterality Date  . ABDOMINAL HYSTERECTOMY    . BREAST BIOPSY Left 04/2014   negative  . BREAST EXCISIONAL BIOPSY Left 2015  . CESAREAN SECTION     X2  . COLONOSCOPY WITH PROPOFOL N/A 04/15/2015   Procedure: COLONOSCOPY WITH PROPOFOL;  Surgeon: Hulen Luster, MD;  Location: [facility] ENDOSCOPY;  Service: Gastroenterology;  Laterality: N/A;  . HERNIA REPAIR    . PORTACATH PLACEMENT Right 05/22/2017   Procedure: INSERTION PORT-A-CATH;  Surgeon: Leonie Green, MD;  Location: [facility] ORS;  Service: General;  Laterality: Right;    SOCIAL HISTORY: Social History   Social History  . Marital status: Married    Spouse name: N/A  . Number of children: N/A  . Years of education: N/A   Occupational History  . Not on file.   Social History Main Topics  .  Smoking status: Never Smoker  . Smokeless tobacco: Never Used  . Alcohol use No  . Drug use: No  . Sexual activity: Not on file   Other Topics Concern  . Not on file   Social History Narrative  . No narrative on file    FAMILY HISTORY Family History  Problem Relation Age of Onset  . Breast cancer Neg Hx     ALLERGIES:  has No Known Allergies.  MEDICATIONS:  Current Outpatient Prescriptions  Medication Sig Dispense Refill  . Calcium Carb-Cholecalciferol (CALCIUM 600-D PO) Take 1 tablet by mouth daily.    [name]  CINNAMON PO Take 2 capsules by mouth daily.    [name] FIBER PO Take 1 capsule by mouth daily.    . fluticasone (FLONASE) 50 MCG/ACT nasal spray Place 2 sprays into both nostrils as needed.     [name] glimepiride (AMARYL) 4 MG tablet Take 4 mg by mouth 2 (two) times daily.    . Glucosamine HCl (GLUCOSAMINE PO) Take 1 tablet by mouth daily.    [name] lidocaine-prilocaine (EMLA) cream Apply 1 application topically as needed. Apply small amount to port site at least 1 hour prior to it being accessed, cover with plastic wrap 30 g 3  . lisinopril-hydrochlorothiazide (PRINZIDE,ZESTORETIC) 20-12.5 MG per tablet Take 1 tablet by mouth daily.    [name] loperamide (IMODIUM) 2 MG capsule Take 1 capsule (2 mg total) by mouth See admin instructions. With onset of loose stool, take 60m followed by 235mevery 2 hours until 12 hours have passed without loose bowel movement. Maximum: 16 mg/day 120 capsule 1  . meloxicam (MOBIC) 15 MG tablet Take 15 mg by mouth daily as needed for pain.     . metFORMIN (GLUCOPHAGE) 500 MG tablet Take 1,000 mg by mouth 2 (two) times daily.     . Multiple Vitamin (MULTIVITAMIN) tablet Take 1 tablet by mouth daily.    . ondansetron (ZOFRAN) 8 MG tablet Take 1 tablet (8 mg total) by mouth every 8 (eight) hours as needed for nausea or vomiting. 120 tablet 0  . sitaGLIPtin (JANUVIA) 100 MG tablet Take 100 mg by mouth daily.     No current facility-administered medications for this visit.     PHYSICAL EXAMINATION:  ECOG PERFORMANCE STATUS: 0 - Asymptomatic  Vitals:   06/06/17 0843  BP: 123/82  Pulse: 80  Resp: 16  Temp: 98.1 F (36.7 C)    Filed Weights   06/06/17 0843  Weight: 188 lb 4.8 oz (85.4 kg)     Physical Exam GENERAL: No distress, well nourished.  SKIN:  No rashes or significant lesions  HEAD: Normocephalic, No masses, lesions, tenderness or abnormalities  EYES: Conjunctiva are pink, non icteric ENT: External ears normal ,lips , buccal mucosa, and tongue normal and mucous  membranes are moist. Grade 1 mucositis.  LYMPH: No palpable cervical and axillary lymphadenopathy  LUNGS: Clear to auscultation, no crackles or wheezes  (+) Medi port right anterior chest wall HEART: Regular rate & rhythm, no murmurs, no gallops, S1 normal and S2 normal  ABDOMEN: Abdomen soft, non-tender, normal bowel sounds, I did not appreciate any  masses or organomegaly  MUSCULOSKELETAL: No CVA tenderness and no tenderness on percussion of the back or rib cage.  EXTREMITIES: No edema, no skin discoloration or tenderness NEURO: Alert & oriented, no focal motor/sensory deficits. Breast exam was performed in lying down position. No evidence of any palpable masses or lumps in the right breast. No evidence of right axillary adenopathy.  Left breast palpable axillary lymph node. + Left breast outer quadrant mass  LABORATORY DATA: No recent labs.  CBC    Component Value Date/Time   WBC 16.2 (H) 06/06/2017 0824   RBC 4.34 06/06/2017 0824   HGB 12.2 06/06/2017 0824   HCT 34.8 (L) 06/06/2017 0824   PLT 334 06/06/2017 0824   MCV 80.1 06/06/2017 0824   MCH 28.0 06/06/2017 0824   MCHC 35.0 06/06/2017 0824   RDW 13.5 06/06/2017 0824   LYMPHSABS 1.7 06/06/2017 0824   MONOABS 1.5 (H) 06/06/2017 0824   EOSABS 0.1 06/06/2017 0824   BASOSABS 0.1 06/06/2017 0824   CMP Latest Ref Rng & Units 06/06/2017 05/23/2017 05/04/2017  Glucose 65 - 99 mg/dL 159(H) 166(H) 132(H)  BUN 6 - 20 mg/dL '14 17 19  ' Creatinine 0.44 - 1.00 mg/dL 0.67 0.71 0.85  Sodium 135 - 145 mmol/L 136 139 137  Potassium 3.5 - 5.1 mmol/L 4.1 4.1 4.2  Chloride 101 - 111 mmol/L 101 104 101  CO2 22 - 32 mmol/L '28 27 27  ' Calcium 8.9 - 10.3 mg/dL 9.6 9.3 10.0  Total Protein 6.5 - 8.1 g/dL 7.0 6.9 7.2  Total Bilirubin 0.3 - 1.2 mg/dL 0.3 0.5 0.5  Alkaline Phos 38 - 126 U/L 89 83 84  AST 15 - 41 U/L '21 17 20  ' ALT 14 - 54 U/L 14 12(L) 12(L)   RADIOGRAPHIC STUDIES: I have personally reviewed the radiological images as listed and agree  with the findings in the report 04/25/2017 Mammogram Diagnostic Targeted ultrasound of the left breast demonstrates an irregular, hypoechoic mass at 1 o'clock, 3 cm from the nipple measuring 10 x 10 x 10 mm corresponding to the mass seen within the anterior upper, outer left breast. At 4 o'clock, 7 cm from the nipple, there is an irregular hypoechoic mass measuring 2.1 x 1.5 x 1.9 cm. Targeted ultrasound of the left axilla demonstrates an enlarged, abnormal axillary lymph node demonstrating cortical thickening up to 6 mm. The masses at 1 and 4 o'clock are separated by approximately 4 cm sonographically. IMPRESSION: Highly suspicious left breast masses at 4 o'clock, 7 cm from the nipple and 1 o'clock, 3 cm from the nipple. Enlarged left axillary lymph node suspicious for metastatic disease.  US breast 04/25/2017 FINDINGS: There is an irregular spiculated mass in the slightly lower, outer left breast, posterior depth measuring approximately 2.6 x 2.5 x 1.9 cm. There is an additional irregular mass within the upper, outer left breast, anterior depth measuring approximately 1 cm in size, which is suspicious for a satellite lesion. There is asymmetric density between the 2 masses suggesting contiguous spread of disease. Mammographically, the masses are separated by approximately 6 cm and in total span approximately 9.6 cm.  Mammographic images were processed with CAD.  Targeted ultrasound of the left breast demonstrates an irregular, hypoechoic mass at 1 o'clock, 3 cm from the nipple measuring 10 x 10 x 10 mm corresponding to the mass seen within the anterior upper, outer left breast. At 4 o'clock, 7 cm from the nipple, there is an irregular hypoechoic mass measuring 2.1 x 1.5 x 1.9 cm. Targeted ultrasound of the left axilla demonstrates an enlarged, abnormal axillary lymph node demonstrating cortical thickening up to 6 mm. The masses at 1 and 4 o'clock are separated by approximately 4  cm sonographically. IMPRESSION: Highly suspicious left breast masses at 4 o'clock, 7 cm from the nipple and 1 o'clock, 3 cm from the nipple. Enlarged left axillary lymph node  suspicious for metastatic disease.  Pathology DIAGNOSIS:  A. BREAST, LEFT, 4 O'CLOCK 7 CM FROM NIPPLE; ULTRASOUND-GUIDED CORE BIOPSY:  - INVASIVE MAMMARY CARCINOMA, NO SPECIAL TYPE, GRADE 2, SEE COMMENT.  - INVASIVE CARCINOMA MEASURES 15 MM IN THIS SAMPLE.  B. BREAST, LEFT, 1 O'CLOCK, 3 CM FROM NIPPLE; ULTRASOUND-GUIDED CORE  BIOPSY:  - INVASIVE MAMMARY CARCINOMA, NO SPECIAL TYPE, GRADE 2, SEE COMMENT.  - INVASIVE CARCINOMA MEASURES 10 MM IN THIS SAMPLE.  C. LYMPH NODE, LEFT AXILLA; ULTRASOUND-GUIDED CORE BIOPSY:  - METASTATIC CARCINOMA, 7 MM METASTASIS, SEE COMMENT.    BREAST BIOMARKER TEST RESULTS - TUMOR A, 4 O'CLOCK:  Estrogen Receptor (ER) Status: POSITIVE, >90% of cells with nuclear  positivity     Average intensity of staining: Strong  Progesterone Receptor (PR) Status: POSITIVE, >90% of cells with nuclear  positivity     Average intensity of staining: Strong  HER2 (by immunohistochemistry): Equivocal (score 2+)  Percentage of cells with uniform intense complete membrane staining: 1%   BREAST BIOMARKER TEST RESULTS - TUMOR B, 1 O'CLOCK:  Estrogen Receptor (ER) Status: POSITIVE, >90% of cells with nuclear  positivity     Average intensity of staining: Strong  Progesterone Receptor (PR) Status: POSITIVE, 51-90% of cells with  nuclear positivity     Average intensity of staining: Moderate to strong  HER2 (by immunohistochemistry): Equivocal (score 2+)  Percentage of cells with uniform intense complete membrane staining: 2%    ADDENDUM #2   BREAST BIOMARKER TEST: HER2 FISH - TUMOR A, 4 O'CLOCK:  HER2 (ERBB2) (by in situ hybridization): NEGATIVE (not amplified)  Number of observers: 2  Number of invasive tumor cells counted: 40  Dual probe assay  Average number of HER2 signals per  cell: 3.6  Average number of CEP17 signals per cell: 2.5  HER2/CEP17 ratio: 1.42   05/14/2017 MRI breast IMPRESSION: 1. Two sites of biopsy proven malignancy seen in the left breast, which span up to 11.3 cm. There are intervening areas of non mass enhancement and a small suspicious enhancing mass. 2. The biopsy-proven metastatic left axillary lymph node is identified. There are 3 other prominent left axillary lymph nodes which are not definitely pathologically enlarged. 3.  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  Cancer Staging Malignant neoplasm of lower-outer quadrant of left breast of female, estrogen receptor positive ([place]) Staging form: Breast, AJCC 8th Edition - Clinical stage from 04/25/2017: Stage IIA (cT3(m), cN1(f), cM0, G2, ER: Positive, PR: Positive, HER2: Equivocal) - Signed by Earlie Server, MD on 05/23/2017  Malignant neoplasm of upper-outer quadrant of left breast in female, estrogen receptor positive ([place]) Staging form: Breast, AJCC 8th Edition - Clinical: No stage assigned - Unsigned  1. Malignant neoplasm of lower-outer quadrant of left breast of female, estrogen receptor positive ([place])   2. Malignant neoplasm of upper-outer quadrant of left breast in female, estrogen receptor positive ([place])   3. 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 Encounter for antineoplastic chemotherapy    # Multifocal left breast cancer, ER+PR+, HER2-, On MRI the mass spans about 11.3 cm area, which could upstage her to cT3N1M0, one of the mass is associated with LVI and DCIS.    # s/p cycle 1 ddAC. Tolerates well. Labs reviewed. OK to proceed to cycle 2 ddAC today.  # Onpro today after chemotherapy.  #  CBC, CMP Prior to each treatment cycles.  # Grade 1 mucositis, magic  mouth wash swish and spit.    I will see her on cycle 3 day 1 06/20/2017 No orders of the defined types were placed in this encounter.   All questions were answered. The patient knows to call the clinic with any problems, questions or concerns.    Earlie Server, MD  06/06/2017 8:27 AM

## 2017-06-19 ENCOUNTER — Emergency Department: Payer: BLUE CROSS/BLUE SHIELD

## 2017-06-19 ENCOUNTER — Telehealth: Payer: Self-pay | Admitting: *Deleted

## 2017-06-19 ENCOUNTER — Emergency Department
Admission: EM | Admit: 2017-06-19 | Discharge: 2017-06-19 | Disposition: A | Discharge status: Home / Self Care | Payer: BLUE CROSS/BLUE SHIELD | Attending: Student in an Organized Health Care Education/Training Program | Admitting: Student in an Organized Health Care Education/Training Program

## 2017-06-19 ENCOUNTER — Other Ambulatory Visit: Payer: Self-pay

## 2017-06-19 DIAGNOSIS — C50412 Malignant neoplasm of upper-outer quadrant of left female breast: Secondary | ICD-10-CM | POA: Insufficient documentation

## 2017-06-19 DIAGNOSIS — Z170 Estrogen receptor positive status [ER+]: Secondary | ICD-10-CM | POA: Insufficient documentation

## 2017-06-19 DIAGNOSIS — R55 Syncope and collapse: Secondary | ICD-10-CM

## 2017-06-19 DIAGNOSIS — W1812XA Fall from or off toilet with subsequent striking against object, initial encounter: Secondary | ICD-10-CM | POA: Insufficient documentation

## 2017-06-19 DIAGNOSIS — Y999 Unspecified external cause status: Secondary | ICD-10-CM | POA: Diagnosis not present

## 2017-06-19 DIAGNOSIS — Z7984 Long term (current) use of oral hypoglycemic drugs: Secondary | ICD-10-CM | POA: Insufficient documentation

## 2017-06-19 DIAGNOSIS — I10 Essential (primary) hypertension: Secondary | ICD-10-CM | POA: Insufficient documentation

## 2017-06-19 DIAGNOSIS — Y92002 Bathroom of unspecified non-institutional (private) residence single-family (private) house as the place of occurrence of the external cause: Secondary | ICD-10-CM | POA: Diagnosis not present

## 2017-06-19 DIAGNOSIS — S0083XA Contusion of other part of head, initial encounter: Secondary | ICD-10-CM | POA: Insufficient documentation

## 2017-06-19 DIAGNOSIS — Y93E8 Activity, other personal hygiene: Secondary | ICD-10-CM | POA: Insufficient documentation

## 2017-06-19 DIAGNOSIS — S0990XA Unspecified injury of head, initial encounter: Secondary | ICD-10-CM

## 2017-06-19 DIAGNOSIS — Z79899 Other long term (current) drug therapy: Secondary | ICD-10-CM | POA: Insufficient documentation

## 2017-06-19 DIAGNOSIS — E119 Type 2 diabetes mellitus without complications: Secondary | ICD-10-CM | POA: Diagnosis not present

## 2017-06-19 LAB — CBC
HCT: 35.2 % (ref 35.0–47.0)
Hemoglobin: 12.3 g/dL (ref 12.0–16.0)
MCH: 27.5 pg (ref 26.0–34.0)
MCHC: 34.9 g/dL (ref 32.0–36.0)
MCV: 78.9 fL — AB (ref 80.0–100.0)
PLATELETS: 207 10*3/uL (ref 150–440)
RBC: 4.46 MIL/uL (ref 3.80–5.20)
RDW: 13.6 % (ref 11.5–14.5)
WBC: 26 10*3/uL — ABNORMAL HIGH (ref 3.6–11.0)

## 2017-06-19 LAB — URINALYSIS, COMPLETE (UACMP) WITH MICROSCOPIC
BILIRUBIN URINE: NEGATIVE
GLUCOSE, UA: NEGATIVE mg/dL
HGB URINE DIPSTICK: NEGATIVE
KETONES UR: NEGATIVE mg/dL
Leukocytes, UA: NEGATIVE
Nitrite: NEGATIVE
PH: 6 (ref 5.0–8.0)
Protein, ur: NEGATIVE mg/dL
Specific Gravity, Urine: 1.014 (ref 1.005–1.030)

## 2017-06-19 LAB — BASIC METABOLIC PANEL
Anion gap: 9 (ref 5–15)
BUN: 16 mg/dL (ref 6–20)
CHLORIDE: 98 mmol/L — AB (ref 101–111)
CO2: 26 mmol/L (ref 22–32)
CREATININE: 0.8 mg/dL (ref 0.44–1.00)
Calcium: 9.8 mg/dL (ref 8.9–10.3)
GFR calc non Af Amer: >60 mL/min (ref >60)
Glucose, Bld: 170 mg/dL — ABNORMAL HIGH (ref 65–99)
Potassium: 4.3 mmol/L (ref 3.5–5.1)
Sodium: 133 mmol/L — ABNORMAL LOW (ref 135–145)

## 2017-06-19 LAB — TROPONIN I: Troponin I: <0.03 ng/mL (ref <0.03)

## 2017-06-19 MED ORDER — SODIUM CHLORIDE 0.9 % IV BOLUS (SEPSIS)
1000.0000 mL | Freq: Once | INTRAVENOUS | Status: AC
  Administered 2017-06-19: 1000 mL via INTRAVENOUS

## 2017-06-19 NOTE — Discharge Instructions (Signed)
Drink plenty of fluids.  Return for fevers, worsening symptoms or for any other concerns or questions.

## 2017-06-19 NOTE — Telephone Encounter (Signed)
Patient reports she got dizzy after getting off toilet from having a bowel movement at 2 AM and she passed out hit her head. She reports that this happened twice.  She has lumps on her head from the fall, she also vomited whilst this was happening. She states her husband helped her get up and that she is feeling fine now, but is asking if she needs to be seen today or not. She does have an appointment tomorrow morning. Please advise

## 2017-06-19 NOTE — ED Provider Notes (Signed)
[facility] Emergency Department Provider Note    First MD Initiated Contact with Patient 06/19/17 1136     (approximate)  I have reviewed the triage vital signs and the nursing notes.   HISTORY  Chief Complaint Loss of Consciousness    HPI VIVIENNE SANGIOVANNI is a 66 y.o. female is currently on her second month of chemotherapy for breast cancer presents with 2 syncopal episodes that occurred last night while she was using the restroom. Patient is a history of same in the past. Most likely related to use the restroom. Since last night she was feeling that her stomach was upset. She was having loose nonbloody non-melanotic stools. On the commode she started feeling nauseated and actually leaned over to the bathtub to vomit. She then feels that she lost consciousness for a brief second and had artery fallen in between the commode and the bathtub. Denies any chest pain or pressure. No shortness of breath. No numbness or tingling. Denies any headache. Is not on any blood thinners. No shaking spells. No fevers.   Past Medical History:  Diagnosis Date  . Anemia   . Arthritis    HIP-LEFT  . Benign neoplasm of colon   . Cancer ([place])   . Diabetes mellitus without complication ([place])   . H/O osteopenia   . Hypertension   . Sleep apnea    USE CPAP  . Uterine cancer ([place])    Family History  Problem Relation Age of Onset  . Diabetes Mother   . Diabetes Father   . Diabetes Sister   . Breast cancer Neg Hx    Past Surgical History:  Procedure Laterality Date  . ABDOMINAL HYSTERECTOMY    . BREAST BIOPSY Left 04/2014   negative  . BREAST EXCISIONAL BIOPSY Left 2015  . CESAREAN SECTION     X2  . COLONOSCOPY WITH PROPOFOL N/A 04/15/2015   Procedure: COLONOSCOPY WITH PROPOFOL;  Surgeon: Hulen Luster, MD;  Location[facility] ENDOSCOPY;  Service: Gastroenterology;  Laterality: N/A;  . HERNIA REPAIR    . PORTACATH PLACEMENT Right 05/22/2017   Procedure: INSERTION  PORT-A-CATH;  Surgeon: Leonie Green, MD;  Location: [facility] ORS;  Service: General;  Laterality: Right;   Patient Active Problem List   Diagnosis Date Noted  . Osteopenia 05/04/2017  . Malignant neoplasm of upper-outer quadrant of left breast in female, estrogen receptor positive ([place]) 05/04/2017  . Malignant neoplasm of lower-outer quadrant of left breast of female, estrogen receptor positive ([place]) 05/04/2017  . History of uterine cancer 04/04/2017  . Chronic midline low back pain without sciatica 11/18/2015  . Controlled type 2 diabetes mellitus without complication, without long-term current use of insulin ([place]) 11/18/2015  . Essential hypertension 11/18/2015  . Diabetes type 2, controlled ([place]) 05/19/2015  . HTN (hypertension) 05/25/2014  . Allergic rhinitis 02/11/2014      Prior to Admission medications   Medication Sig Start Date End Date Taking? Authorizing Provider  Calcium Carb-Cholecalciferol (CALCIUM 600-D PO) Take 1 tablet by mouth daily.   Yes [provider]  CINNAMON PO Take 2 capsules by mouth daily.   Yes [provider]  FIBER PO Take 1 capsule by mouth daily.   Yes [provider]  fluticasone (FLONASE) 50 MCG/ACT nasal spray Place 2 sprays into both nostrils as needed.    Yes [provider]  Glucosamine HCl (GLUCOSAMINE PO) Take 1 tablet by mouth daily.   Yes [provider]  lidocaine-prilocaine (EMLA) cream Apply  1 application topically as needed. Apply small amount to port site at least 1 hour prior to it being accessed, cover with plastic wrap 05/18/17  Yes Earlie Server, MD  lisinopril-hydrochlorothiazide (PRINZIDE,ZESTORETIC) 20-12.5 MG per tablet Take 1 tablet by mouth daily.   Yes [provider]  loperamide (IMODIUM) 2 MG capsule Take 1 capsule (2 mg total) by mouth See admin instructions. With onset of loose stool, take 4mg  followed by 2mg  every 2 hours until 12 hours have passed without loose bowel  movement. Maximum: 16 mg/day 05/16/17  Yes Earlie Server, MD  metFORMIN (GLUCOPHAGE) 500 MG tablet Take 1,000 mg by mouth 2 (two) times daily.    Yes [provider]  Multiple Vitamin (MULTIVITAMIN) tablet Take 1 tablet by mouth daily.   Yes [provider]  ondansetron (ZOFRAN) 8 MG tablet Take 1 tablet (8 mg total) by mouth every 8 (eight) hours as needed for nausea or vomiting. 05/16/17  Yes Earlie Server, MD  sitaGLIPtin (JANUVIA) 100 MG tablet Take 100 mg by mouth daily.   Yes [provider]    Allergies Patient has no known allergies.    Social History Social History  Substance Use Topics  . Smoking status: Never Smoker  . Smokeless tobacco: Never Used  . Alcohol use No    Review of Systems Patient denies headaches, rhinorrhea, blurry vision, numbness, shortness of breath, chest pain, edema, cough, abdominal pain, nausea, vomiting, diarrhea, dysuria, fevers, rashes or hallucinations unless otherwise stated above in HPI. ____________________________________________   PHYSICAL EXAM:  VITAL SIGNS: Vitals:   06/19/17 1300 06/19/17 1330  BP: (!) 120/58 108/63  Pulse: 79   Resp: 16   Temp:    SpO2: 100%     Constitutional: Alert and oriented. Well appearing and in no acute distress. Eyes: Conjunctivae are normal.  Head: small contusion to left parietal skull and right forehead, no laceration or abrasion. Nose: No congestion/rhinnorhea. Mouth/Throat: Mucous membranes are moist.   Neck: No stridor. Painless ROM.  Cardiovascular: Normal rate, regular rhythm. Grossly normal heart sounds.  Good peripheral circulation. Respiratory: Normal respiratory effort.  No retractions. Lungs CTAB. Gastrointestinal: Soft and nontender. No distention. No abdominal bruits. No CVA tenderness. Genitourinary:  Musculoskeletal: No lower extremity tenderness nor edema.  No joint effusions. Neurologic:  Normal speech and language. No gross focal neurologic deficits are  appreciated. No facial droop Skin:  Skin is warm, dry and intact. No rash noted. Psychiatric: Mood and affect are normal. Speech and behavior are normal.  ____________________________________________   LABS (all labs ordered are listed, but only abnormal results are displayed)  Results for orders placed or performed during the hospital encounter of 06/19/17 (from the past 24 hour(s))  Basic metabolic panel     Status: Abnormal   Collection Time: 06/19/17 10:58 AM  Result Value Ref Range   Sodium 133 (L) 135 - 145 mmol/L   Potassium 4.3 3.5 - 5.1 mmol/L   Chloride 98 (L) 101 - 111 mmol/L   CO2 26 22 - 32 mmol/L   Glucose, Bld 170 (H) 65 - 99 mg/dL   BUN 16 6 - 20 mg/dL   Creatinine, Ser 0.80 0.44 - 1.00 mg/dL   Calcium 9.8 8.9 - 10.3 mg/dL   GFR calc non Af Amer >60 >60 mL/min   GFR calc Af Amer >60 >60 mL/min   Anion gap 9 5 - 15  CBC     Status: Abnormal   Collection Time: 06/19/17 10:58 AM  Result Value Ref  Range   WBC 26.0 (H) 3.6 - 11.0 K/uL   RBC 4.46 3.80 - 5.20 MIL/uL   Hemoglobin 12.3 12.0 - 16.0 g/dL   HCT 35.2 35.0 - 47.0 %   MCV 78.9 (L) 80.0 - 100.0 fL   MCH 27.5 26.0 - 34.0 pg   MCHC 34.9 32.0 - 36.0 g/dL   RDW 13.6 11.5 - 14.5 %   Platelets 207 150 - 440 K/uL  Troponin I     Status: None   Collection Time: 06/19/17 10:58 AM  Result Value Ref Range   Troponin I <0.03 <0.03 ng/mL  Urinalysis, Complete w Microscopic     Status: Abnormal   Collection Time: 06/19/17 12:20 PM  Result Value Ref Range   Color, Urine YELLOW (A) YELLOW   APPearance CLEAR (A) CLEAR   Specific Gravity, Urine 1.014 1.005 - 1.030   pH 6.0 5.0 - 8.0   Glucose, UA NEGATIVE NEGATIVE mg/dL   Hgb urine dipstick NEGATIVE NEGATIVE   Bilirubin Urine NEGATIVE NEGATIVE   Ketones, ur NEGATIVE NEGATIVE mg/dL   Protein, ur NEGATIVE NEGATIVE mg/dL   Nitrite NEGATIVE NEGATIVE   Leukocytes, UA NEGATIVE NEGATIVE   RBC / HPF 0-5 0 - 5 RBC/hpf   WBC, UA 0-5 0 - 5 WBC/hpf   Bacteria, UA RARE (A)  NONE SEEN   Squamous Epithelial / LPF 0-5 (A) NONE SEEN   Hyaline Casts, UA PRESENT    ____________________________________________  EKG My review and personal interpretation at Time: 10:51   Indication: syncope  Rate: 90  Rhythm: sinus Axis: normal Other: occasional pvc, nostemi, normal intervals ____________________________________________  RADIOLOGY  I personally reviewed all radiographic images ordered to evaluate for the above acute complaints and reviewed radiology reports and findings.  These findings were personally discussed with the patient.  Please see medical record for radiology report.  ____________________________________________   PROCEDURES  Procedure(s) performed:  Procedures    Critical Care performed: no ____________________________________________   INITIAL IMPRESSION / ASSESSMENT AND PLAN / ED COURSE  Pertinent labs & imaging results that were available during my care of the patient were reviewed by me and considered in my medical decision making (see chart for details).  DDX: dehydration, sepsis, iph,, cva, pe, acs, dysrhythmia  BRAD MCGAUGHY is a 66 y.o. who presents to the ED with syncopal event as described above associated with using the restroom and vomiting last night. Patient without any focal deficits but does have head trauma. CT imaging ordered to evaluate for bleed shows no acute abnormality. She is less consistent with seizure. Denies any chest pain or shortness of breath to suggest cardiac or pulmonary process. EKG and troponin are negative for ischemia. May have a small component of dehydration therefore we'll give IV fluids.  Clinical Course as of Jun 19 1420  Tue Jun 19, 2017  1314 I spoke with Dr. Darl Householder regarding the patient's presentation.  Patient had recently received Neulasta therefore leukocytosis would be expected at this point. Patient receiving IV fluids. I will send for cultures but will not initiate antibiotics at this  time she is otherwise well-appearing.  [PR]    Clinical Course User Index [PR] Merlyn Lot, MD     ____________________________________________   FINAL CLINICAL IMPRESSION(S) / ED DIAGNOSES  Final diagnoses:  Syncope and collapse  Traumatic injury of head, initial encounter      NEW MEDICATIONS STARTED DURING THIS VISIT:  New Prescriptions   No medications on file     Note:  This  document was prepared using Systems analyst and may include unintentional dictation errors.    Merlyn Lot, MD 06/19/17 1421

## 2017-06-19 NOTE — ED Notes (Signed)
Patient at xray

## 2017-06-19 NOTE — Telephone Encounter (Signed)
Per VO Dr Tasia Catchings, patient needs to go to the ER for evaluation. Patient informed and stated she did not want to go, but that  stated she will go to be evaluated as suggested

## 2017-06-19 NOTE — ED Triage Notes (Signed)
Pt states she had 2 different syncopal episodes during the night last night, pt is currently under going chemotherapy for breast CA, pt has a hematoma to the  Right side and posterior head. Pt also c/o right side rib/back pain.the patient is a/ox4. In NAD.[name]

## 2017-06-19 NOTE — ED Notes (Signed)
Patient attempting urine sample.

## 2017-06-20 ENCOUNTER — Inpatient Hospital Stay: Payer: BLUE CROSS/BLUE SHIELD | Attending: Oncology

## 2017-06-20 ENCOUNTER — Inpatient Hospital Stay: Payer: BLUE CROSS/BLUE SHIELD

## 2017-06-20 ENCOUNTER — Inpatient Hospital Stay (HOSPITAL_BASED_OUTPATIENT_CLINIC_OR_DEPARTMENT_OTHER): Payer: BLUE CROSS/BLUE SHIELD | Admitting: Oncology

## 2017-06-20 ENCOUNTER — Encounter: Payer: Self-pay | Admitting: Oncology

## 2017-06-20 VITALS — BP 115/72 | HR 80 | Temp 98.8°F | Resp 18 | Wt 188.2 lb

## 2017-06-20 DIAGNOSIS — R590 Localized enlarged lymph nodes: Secondary | ICD-10-CM | POA: Insufficient documentation

## 2017-06-20 DIAGNOSIS — C50412 Malignant neoplasm of upper-outer quadrant of left female breast: Secondary | ICD-10-CM | POA: Diagnosis not present

## 2017-06-20 DIAGNOSIS — M8580 Other specified disorders of bone density and structure, unspecified site: Secondary | ICD-10-CM

## 2017-06-20 DIAGNOSIS — K1230 Oral mucositis (ulcerative), unspecified: Secondary | ICD-10-CM | POA: Diagnosis not present

## 2017-06-20 DIAGNOSIS — Z7984 Long term (current) use of oral hypoglycemic drugs: Secondary | ICD-10-CM | POA: Insufficient documentation

## 2017-06-20 DIAGNOSIS — Z800 Family history of malignant neoplasm of digestive organs: Secondary | ICD-10-CM | POA: Insufficient documentation

## 2017-06-20 DIAGNOSIS — I10 Essential (primary) hypertension: Secondary | ICD-10-CM

## 2017-06-20 DIAGNOSIS — Z170 Estrogen receptor positive status [ER+]: Secondary | ICD-10-CM

## 2017-06-20 DIAGNOSIS — C773 Secondary and unspecified malignant neoplasm of axilla and upper limb lymph nodes: Secondary | ICD-10-CM

## 2017-06-20 DIAGNOSIS — C50912 Malignant neoplasm of unspecified site of left female breast: Secondary | ICD-10-CM

## 2017-06-20 DIAGNOSIS — Z923 Personal history of irradiation: Secondary | ICD-10-CM | POA: Diagnosis not present

## 2017-06-20 DIAGNOSIS — D649 Anemia, unspecified: Secondary | ICD-10-CM | POA: Insufficient documentation

## 2017-06-20 DIAGNOSIS — M129 Arthropathy, unspecified: Secondary | ICD-10-CM | POA: Diagnosis not present

## 2017-06-20 DIAGNOSIS — Z8542 Personal history of malignant neoplasm of other parts of uterus: Secondary | ICD-10-CM | POA: Insufficient documentation

## 2017-06-20 DIAGNOSIS — R5383 Other fatigue: Secondary | ICD-10-CM | POA: Diagnosis not present

## 2017-06-20 DIAGNOSIS — C50512 Malignant neoplasm of lower-outer quadrant of left female breast: Secondary | ICD-10-CM

## 2017-06-20 DIAGNOSIS — Z7689 Persons encountering health services in other specified circumstances: Secondary | ICD-10-CM

## 2017-06-20 DIAGNOSIS — Z5111 Encounter for antineoplastic chemotherapy: Secondary | ICD-10-CM

## 2017-06-20 DIAGNOSIS — G4730 Sleep apnea, unspecified: Secondary | ICD-10-CM

## 2017-06-20 DIAGNOSIS — R110 Nausea: Secondary | ICD-10-CM

## 2017-06-20 DIAGNOSIS — E119 Type 2 diabetes mellitus without complications: Secondary | ICD-10-CM | POA: Diagnosis not present

## 2017-06-20 DIAGNOSIS — Z79899 Other long term (current) drug therapy: Secondary | ICD-10-CM | POA: Insufficient documentation

## 2017-06-20 DIAGNOSIS — R718 Other abnormality of red blood cells: Secondary | ICD-10-CM

## 2017-06-20 LAB — COMPREHENSIVE METABOLIC PANEL
ALBUMIN: 3.6 g/dL (ref 3.5–5.0)
ALT: 16 U/L (ref 14–54)
ANION GAP: 7 (ref 5–15)
AST: 23 U/L (ref 15–41)
Alkaline Phosphatase: 84 U/L (ref 38–126)
BUN: 12 mg/dL (ref 6–20)
CHLORIDE: 101 mmol/L (ref 101–111)
CO2: 26 mmol/L (ref 22–32)
Calcium: 9.1 mg/dL (ref 8.9–10.3)
Creatinine, Ser: 0.71 mg/dL (ref 0.44–1.00)
GFR calc Af Amer: >60 mL/min (ref >60)
GLUCOSE: 184 mg/dL — AB (ref 65–99)
POTASSIUM: 3.8 mmol/L (ref 3.5–5.1)
Sodium: 134 mmol/L — ABNORMAL LOW (ref 135–145)
TOTAL PROTEIN: 6.4 g/dL — AB (ref 6.5–8.1)
Total Bilirubin: 0.3 mg/dL (ref 0.3–1.2)

## 2017-06-20 LAB — CBC WITH DIFFERENTIAL/PLATELET
BASOS ABS: 0 10*3/uL (ref 0–0.1)
Basophils Relative: 0 %
EOS PCT: 0 %
Eosinophils Absolute: 0 10*3/uL (ref 0–0.7)
HEMATOCRIT: 31 % — AB (ref 35.0–47.0)
Hemoglobin: 11 g/dL — ABNORMAL LOW (ref 12.0–16.0)
LYMPHS ABS: 1.2 10*3/uL (ref 1.0–3.6)
LYMPHS PCT: 7 %
MCH: 28.1 pg (ref 26.0–34.0)
MCHC: 35.5 g/dL (ref 32.0–36.0)
MCV: 79.2 fL — ABNORMAL LOW (ref 80.0–100.0)
Monocytes Absolute: 1.3 10*3/uL — ABNORMAL HIGH (ref 0.2–0.9)
Monocytes Relative: 8 %
NEUTROS ABS: 14.5 10*3/uL — AB (ref 1.4–6.5)
Neutrophils Relative %: 85 %
PLATELETS: 178 10*3/uL (ref 150–440)
RBC: 3.91 MIL/uL (ref 3.80–5.20)
RDW: 13.6 % (ref 11.5–14.5)
WBC: 17.1 10*3/uL — AB (ref 3.6–11.0)

## 2017-06-20 LAB — IRON AND TIBC
Iron: 87 ug/dL (ref 28–170)
SATURATION RATIOS: 34 % — AB (ref 10.4–31.8)
TIBC: 253 ug/dL (ref 250–450)
UIBC: 166 ug/dL

## 2017-06-20 LAB — FERRITIN: FERRITIN: 539 ng/mL — AB (ref 11–307)

## 2017-06-20 MED ORDER — PALONOSETRON HCL INJECTION 0.25 MG/5ML
0.2500 mg | Freq: Once | INTRAVENOUS | Status: AC
  Administered 2017-06-20: 0.25 mg via INTRAVENOUS
  Filled 2017-06-20: qty 5

## 2017-06-20 MED ORDER — SODIUM CHLORIDE 0.9% FLUSH
10.0000 mL | Freq: Once | INTRAVENOUS | Status: AC
Start: 2017-06-20 — End: 2017-06-20
  Administered 2017-06-20: 10 mL via INTRAVENOUS
  Filled 2017-06-20: qty 10

## 2017-06-20 MED ORDER — PEGFILGRASTIM 6 MG/0.6ML ~~LOC~~ PSKT
6.0000 mg | PREFILLED_SYRINGE | Freq: Once | SUBCUTANEOUS | Status: AC
  Administered 2017-06-20: 6 mg via SUBCUTANEOUS
  Filled 2017-06-20: qty 0.6

## 2017-06-20 MED ORDER — SODIUM CHLORIDE 0.9 % IV SOLN
Freq: Once | INTRAVENOUS | Status: AC
  Administered 2017-06-20: 10:00:00 via INTRAVENOUS
  Filled 2017-06-20: qty 1000

## 2017-06-20 MED ORDER — HEPARIN SOD (PORK) LOCK FLUSH 100 UNIT/ML IV SOLN
500.0000 [IU] | Freq: Once | INTRAVENOUS | Status: AC
  Administered 2017-06-20: 500 [IU] via INTRAVENOUS
  Filled 2017-06-20: qty 5

## 2017-06-20 MED ORDER — CYCLOPHOSPHAMIDE CHEMO INJECTION 1 GM
600.0000 mg/m2 | Freq: Once | INTRAMUSCULAR | Status: AC
  Administered 2017-06-20: 1120 mg via INTRAVENOUS
  Filled 2017-06-20: qty 6

## 2017-06-20 MED ORDER — DOXORUBICIN HCL CHEMO IV INJECTION 2 MG/ML
60.0000 mg/m2 | Freq: Once | INTRAVENOUS | Status: AC
  Administered 2017-06-20: 112 mg via INTRAVENOUS
  Filled 2017-06-20: qty 56

## 2017-06-20 MED ORDER — SODIUM CHLORIDE 0.9 % IV SOLN
Freq: Once | INTRAVENOUS | Status: AC
  Administered 2017-06-20: 11:00:00 via INTRAVENOUS
  Filled 2017-06-20: qty 5

## 2017-06-20 NOTE — Progress Notes (Signed)
Patient here today for follow up.   

## 2017-06-20 NOTE — Progress Notes (Signed)
[facility] Follow Up Visit.  Patient Care Team: Glendon Axe, MD as PCP - General (Internal Medicine)  CHIEF COMPLAINTS/PURPOSE OF CONSULTATION: History of Uterine Cancer, treated with radiation.   HISTORY OF PRESENTING ILLNESS: Dominique Guzman 66 y.o. female with PMH listed as below is referred by Dr.Schermerhorn to [name] for evaluation and management of newly diagnosed breast cancer. Patient had a history a left breast mass biopsy followed by breast excisonal biopsy in 2015 with benign pathology.  She also has a history of Uterine cancer which was treated with RT with Dr.Chrystal.  She underwent routine screening mammogram on 04/17/2017 which recommended a possible mass on the left breast. Left diagnostic mammogram showed a highly suspicious left breast mass at 4 o'clock, 7 cm from the nipple and 1 o'clock,,  About 3 cm from the nipple. Enlarged left axillary lymph node suspicious for metastatic disease.  1 US breast confirmed a left breast mass of 2.1cm, 4 o'clock, and 1.1 cm mass at 1 o'clock.The masses at 1 and 4 o'clock are separated by approximately 4 cm sonographically 2 Biopsy of the two mass and axillary lymph node revealed invasive carcinoma, grade 2. Both are ER,PR positive. HER2 equivocal, FISH pending. 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  3 05/14/2017 MRI breast showed Two sites of biopsy proven malignancy seen in the left breast, which span up to 11.3 cm. There are intervening areas of non mass enhancement and a small suspicious enhancing mass. 4 Case was discussed on breast tumor board on 05/21/2017 and the panel agree with neoadjuvant ddAC to T followed by surgery.  5 Started on ddAC on 05/23/2017.   INTERVAL HISTORY She comes for evaluation before Cycle 3  ddAC. She tolerate first cycle AC well and felt some nausea after 2nd cycle of AC.  She had 2 syncopal episodes that occurred on Day 12 of cycle 2 while she was using the restroom. Patient  is a history of similar episode in the past before chemotherapy. She was feeling that her stomach was upset also had loose nonbloody non-melanotic stools. On the commode she started feeling nauseated and actually leaned over to the bathtub to vomit. She then feels that she lost consciousness for a brief second and had fallen in between the commode and the bathtub, hit her head. She called the our office the next morning and I instruct her to go to ER for evaluation.  She had CT head which showed no intracranial bleeding. Her electrolytes are WNL and she was sent home.  Today she is accompanied by her husband for evaluation prior to cycle 3 ddAC. Her nausea is better after taking Zofran. She also took imodium and diarrhea has stopped. Denies headache, SOB, chest pain, abdominal pain. Denies any neurological complaint.   Review of Systems  Constitutional: Negative.   HENT:  Negative.   Eyes: Negative.   Respiratory: Negative.   Cardiovascular: Negative.   Gastrointestinal: Negative.   Endocrine: Negative.   Genitourinary: Negative.    Musculoskeletal: Negative.   Skin: Negative.     MEDICAL HISTORY: Past Medical History:  Diagnosis Date  . Anemia   . Arthritis    HIP-LEFT  . Benign neoplasm of colon   . Cancer ([place])   . Diabetes mellitus without complication ([place])   . H/O osteopenia   . Hypertension   . Sleep apnea    USE CPAP  . Uterine cancer ([place])     SURGICAL HISTORY: Past Surgical History:  Procedure Laterality Date  . ABDOMINAL HYSTERECTOMY    . BREAST BIOPSY Left 04/2014   negative  . BREAST EXCISIONAL BIOPSY Left 2015  . CESAREAN SECTION     X2  . COLONOSCOPY WITH PROPOFOL N/A 04/15/2015   Procedure: COLONOSCOPY WITH PROPOFOL;  Surgeon: Hulen Luster, MD;  Location: [facility] ENDOSCOPY;  Service: Gastroenterology;  Laterality: N/A;  . HERNIA REPAIR    . PORTACATH PLACEMENT Right 05/22/2017   Procedure: INSERTION PORT-A-CATH;  Surgeon: Leonie Green, MD;  Location: [facility]  ORS;  Service: General;  Laterality: Right;    SOCIAL HISTORY: Social History   Social History  . Marital status: Married    Spouse name: N/A  . Number of children: N/A  . Years of education: N/A   Occupational History  . Not on file.   Social History Main Topics  . Smoking status: Never Smoker  . Smokeless tobacco: Never Used  . Alcohol use No  . Drug use: No  . Sexual activity: Not on file   Other Topics Concern  . Not on file   Social History Narrative  . No narrative on file    FAMILY HISTORY Family History  Problem Relation Age of Onset  . Diabetes Mother   . Diabetes Father   . Diabetes Sister   . Breast cancer Neg Hx     ALLERGIES:  has No Known Allergies.  MEDICATIONS:  Current Outpatient Prescriptions  Medication Sig Dispense Refill  . Calcium Carb-Cholecalciferol (CALCIUM 600-D PO) Take 1 tablet by mouth daily.    [name] CINNAMON PO Take 2 capsules by mouth daily.    [name] FIBER PO Take 1 capsule by mouth daily.    . fluticasone (FLONASE) 50 MCG/ACT nasal spray Place 2 sprays into both nostrils as needed.     . Glucosamine HCl (GLUCOSAMINE PO) Take 1 tablet by mouth daily.    [name] lidocaine-prilocaine (EMLA) cream Apply 1 application topically as needed. Apply small amount to port site at least 1 hour prior to it being accessed, cover with plastic wrap 30 g 3  . lisinopril-hydrochlorothiazide (PRINZIDE,ZESTORETIC) 20-12.5 MG per tablet Take 1 tablet by mouth daily.    [name] loperamide (IMODIUM) 2 MG capsule Take 1 capsule (2 mg total) by mouth See admin instructions. With onset of loose stool, take 44m followed by 21mevery 2 hours until 12 hours have passed without loose bowel movement. Maximum: 16 mg/day 120 capsule 1  . metFORMIN (GLUCOPHAGE) 500 MG tablet Take 1,000 mg by mouth 2 (two) times daily.     . Multiple Vitamin (MULTIVITAMIN) tablet Take 1 tablet by mouth daily.    . ondansetron (ZOFRAN) 8 MG tablet Take 1 tablet (8 mg total) by mouth every 8 (eight) hours  as needed for nausea or vomiting. 120 tablet 0  . sitaGLIPtin (JANUVIA) 100 MG tablet Take 100 mg by mouth daily.     No current facility-administered medications for this visit.    Facility-Administered Medications Ordered in Other Visits  Medication Dose Route Frequency Provider Last Rate Last Dose  . heparin lock flush 100 unit/mL  500 Units Intravenous Once YuEarlie ServerMD        PHYSICAL EXAMINATION:  ECOG PERFORMANCE STATUS: 0 - Asymptomatic  Vitals:   06/20/17 0933  BP: 115/72  Pulse: 80  Resp: 18  Temp: 98.8 F (37.1 C)    Filed Weights   06/20/17 0933  Weight: 188 lb 3 oz (85.4 kg)     Physical Exam GENERAL: No  distress, well nourished.  SKIN:  No rashes or significant lesions  HEAD: Normocephalic, No masses, lesions, tenderness or abnormalities. Bruises on her right forehead and scalp.  EYES: Conjunctiva are pink, non icteric ENT: External ears normal ,lips , buccal mucosa, and tongue normal and mucous membranes are moist. Grade 1 mucositis.  LYMPH: No palpable cervical and axillary lymphadenopathy  LUNGS: Clear to auscultation, no crackles or wheezes  (+) Medi port right anterior chest wall HEART: Regular rate & rhythm, no murmurs, no gallops, S1 normal and S2 normal  ABDOMEN: Abdomen soft, non-tender, normal bowel sounds, I did not appreciate any  masses or organomegaly  MUSCULOSKELETAL: No CVA tenderness and no tenderness on percussion of the back or rib cage.  EXTREMITIES: No edema, no skin discoloration or tenderness NEURO: Alert & oriented, no focal motor/sensory deficits. Breast exam was performed in lying down position. No evidence of any palpable masses or lumps in the right breast. No evidence of right axillary adenopathy. Left breast axillary lymph node.  Left breast outer quadrant mass, clinically decreasing.   LABORATORY DATA: No recent labs.  CBC    Component Value Date/Time   WBC 17.1 (H) 06/20/2017 0916   RBC 3.91 06/20/2017 0916   HGB 11.0  (L) 06/20/2017 0916   HCT 31.0 (L) 06/20/2017 0916   PLT 178 06/20/2017 0916   MCV 79.2 (L) 06/20/2017 0916   MCH 28.1 06/20/2017 0916   MCHC 35.5 06/20/2017 0916   RDW 13.6 06/20/2017 0916   LYMPHSABS 1.2 06/20/2017 0916   MONOABS 1.3 (H) 06/20/2017 0916   EOSABS 0.0 06/20/2017 0916   BASOSABS 0.0 06/20/2017 0916   CMP Latest Ref Rng & Units 06/20/2017 06/19/2017 06/06/2017  Glucose 65 - 99 mg/dL 184(H) 170(H) 159(H)  BUN 6 - 20 mg/dL '12 16 14  ' Creatinine 0.44 - 1.00 mg/dL 0.71 0.80 0.67  Sodium 135 - 145 mmol/L 134(L) 133(L) 136  Potassium 3.5 - 5.1 mmol/L 3.8 4.3 4.1  Chloride 101 - 111 mmol/L 101 98(L) 101  CO2 22 - 32 mmol/L '26 26 28  ' Calcium 8.9 - 10.3 mg/dL 9.1 9.8 9.6  Total Protein 6.5 - 8.1 g/dL 6.4(L) - 7.0  Total Bilirubin 0.3 - 1.2 mg/dL 0.3 - 0.3  Alkaline Phos 38 - 126 U/L 84 - 89  AST 15 - 41 U/L 23 - 21  ALT 14 - 54 U/L 16 - 14   RADIOGRAPHIC STUDIES: I have personally reviewed the radiological images as listed and agree with the findings in the report 04/25/2017 Mammogram Diagnostic Targeted ultrasound of the left breast demonstrates an irregular, hypoechoic mass at 1 o'clock, 3 cm from the nipple measuring 10 x 10 x 10 mm corresponding to the mass seen within the anterior upper, outer left breast. At 4 o'clock, 7 cm from the nipple, there is an irregular hypoechoic mass measuring 2.1 x 1.5 x 1.9 cm. Targeted ultrasound of the left axilla demonstrates an enlarged, abnormal axillary lymph node demonstrating cortical thickening up to 6 mm. The masses at 1 and 4 o'clock are separated by approximately 4 cm sonographically. IMPRESSION: Highly suspicious left breast masses at 4 o'clock, 7 cm from the nipple and 1 o'clock, 3 cm from the nipple. Enlarged left axillary lymph node suspicious for metastatic disease.  US breast 04/25/2017 FINDINGS: There is an irregular spiculated mass in the slightly lower, outer left breast, posterior depth measuring approximately  2.6 x 2.5 x 1.9 cm. There is an additional irregular mass within the upper, outer left breast,  anterior depth measuring approximately 1 cm in size, which is suspicious for a satellite lesion. There is asymmetric density between the 2 masses suggesting contiguous spread of disease. Mammographically, the masses are separated by approximately 6 cm and in total span approximately 9.6 cm.  Mammographic images were processed with CAD.  Targeted ultrasound of the left breast demonstrates an irregular, hypoechoic mass at 1 o'clock, 3 cm from the nipple measuring 10 x 10 x 10 mm corresponding to the mass seen within the anterior upper, outer left breast. At 4 o'clock, 7 cm from the nipple, there is an irregular hypoechoic mass measuring 2.1 x 1.5 x 1.9 cm. Targeted ultrasound of the left axilla demonstrates an enlarged, abnormal axillary lymph node demonstrating cortical thickening up to 6 mm. The masses at 1 and 4 o'clock are separated by approximately 4 cm sonographically. IMPRESSION: Highly suspicious left breast masses at 4 o'clock, 7 cm from the nipple and 1 o'clock, 3 cm from the nipple. Enlarged left axillary lymph node suspicious for metastatic disease.  Pathology DIAGNOSIS:  A. BREAST, LEFT, 4 O'CLOCK 7 CM FROM NIPPLE; ULTRASOUND-GUIDED CORE BIOPSY:  - INVASIVE MAMMARY CARCINOMA, NO SPECIAL TYPE, GRADE 2, SEE COMMENT.  - INVASIVE CARCINOMA MEASURES 15 MM IN THIS SAMPLE.  B. BREAST, LEFT, 1 O'CLOCK, 3 CM FROM NIPPLE; ULTRASOUND-GUIDED CORE  BIOPSY:  - INVASIVE MAMMARY CARCINOMA, NO SPECIAL TYPE, GRADE 2, SEE COMMENT.  - INVASIVE CARCINOMA MEASURES 10 MM IN THIS SAMPLE.  C. LYMPH NODE, LEFT AXILLA; ULTRASOUND-GUIDED CORE BIOPSY:  - METASTATIC CARCINOMA, 7 MM METASTASIS, SEE COMMENT.    BREAST BIOMARKER TEST RESULTS - TUMOR A, 4 O'CLOCK:  Estrogen Receptor (ER) Status: POSITIVE, >90% of cells with nuclear  positivity     Average intensity of staining: Strong  Progesterone  Receptor (PR) Status: POSITIVE, >90% of cells with nuclear  positivity     Average intensity of staining: Strong  HER2 (by immunohistochemistry): Equivocal (score 2+)  Percentage of cells with uniform intense complete membrane staining: 1%   BREAST BIOMARKER TEST RESULTS - TUMOR B, 1 O'CLOCK:  Estrogen Receptor (ER) Status: POSITIVE, >90% of cells with nuclear  positivity     Average intensity of staining: Strong  Progesterone Receptor (PR) Status: POSITIVE, 51-90% of cells with  nuclear positivity     Average intensity of staining: Moderate to strong  HER2 (by immunohistochemistry): Equivocal (score 2+)  Percentage of cells with uniform intense complete membrane staining: 2%    ADDENDUM #2   BREAST BIOMARKER TEST: HER2 FISH - TUMOR A, 4 O'CLOCK:  HER2 (ERBB2) (by in situ hybridization): NEGATIVE (not amplified)  Number of observers: 2  Number of invasive tumor cells counted: 40  Dual probe assay  Average number of HER2 signals per cell: 3.6  Average number of CEP17 signals per cell: 2.5  HER2/CEP17 ratio: 1.42   05/14/2017 MRI breast IMPRESSION: 1. Two sites of biopsy proven malignancy seen in the left breast, which span up to 11.3 cm. There are intervening areas of non mass enhancement and a small suspicious enhancing mass. 2. The biopsy-proven metastatic left axillary lymph node is identified. There are 3 other prominent left axillary lymph nodes which are not definitely pathologically enlarged. 3.  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  Cancer Staging Malignant neoplasm of lower-outer quadrant of left breast of female, estrogen receptor positive ([place])  Staging form: Breast, AJCC 8th Edition - Clinical stage from 04/25/2017: Stage IIA (cT3(m), cN1(f), cM0, G2, ER:  Positive, PR: Positive, HER2: Equivocal) - Signed by Earlie Server, MD on 05/23/2017  Malignant neoplasm of upper-outer quadrant of left breast in female, estrogen receptor positive ([place]) Staging form: Breast, AJCC 8th Edition - Clinical: No stage assigned - Unsigned  1. Malignant neoplasm of upper-outer quadrant of left breast in female, estrogen receptor positive ([place])   2. Malignant neoplasm of lower-outer quadrant of left breast of female, estrogen receptor positive ([place])   3. Breast cancer metastasized to axillary lymph node, left ([place])   4. Encounter for antineoplastic chemotherapy   5. Mucositis   6. History of uterine cancer   7. RBC microcytosis    # Multifocal left breast cancer, ER+PR+, HER2-, On MRI the mass spans about 11.3 cm area, which could upstage her to cT3N1M0, one of the mass is associated with LVI and DCIS.    # s/p cycle 2 ddAC. Tolerates well. Labs reviewed. OK to proceed to cycle 3 ddAC today.  # Onpro today after chemotherapy.   # CBC, CMP Prior to each treatment cycles.  # RBC microcytosis, check iron TIBC ferritin level. # Grade 1 mucositis, magic mouth wash swish and spit, improved.  I will see her on cycle 3 day 1 06/20/2017  Orders Placed This Encounter  Procedures  . Iron and TIBC    Standing Status:   Future    Number of Occurrences:   1    Standing Expiration Date:   06/20/2018  . Ferritin    Standing Status:   Future    Number of Occurrences:   1    Standing Expiration Date:   06/20/2018    All questions were answered. The patient knows to call the clinic with any problems, questions or concerns.    Earlie Server, MD  06/20/2017 9:56 AM

## 2017-06-24 LAB — CULTURE, BLOOD (ROUTINE X 2)
CULTURE: NO GROWTH
Culture: NO GROWTH

## 2017-06-28 ENCOUNTER — Encounter: Payer: Self-pay | Admitting: *Deleted

## 2017-07-04 ENCOUNTER — Other Ambulatory Visit: Payer: Self-pay | Admitting: Oncology

## 2017-07-04 ENCOUNTER — Inpatient Hospital Stay (HOSPITAL_BASED_OUTPATIENT_CLINIC_OR_DEPARTMENT_OTHER): Payer: BLUE CROSS/BLUE SHIELD | Admitting: Oncology

## 2017-07-04 ENCOUNTER — Inpatient Hospital Stay: Payer: BLUE CROSS/BLUE SHIELD

## 2017-07-04 ENCOUNTER — Encounter: Payer: Self-pay | Admitting: Oncology

## 2017-07-04 VITALS — BP 115/79 | HR 89 | Temp 98.5°F | Resp 16 | Ht <= 58 in | Wt 185.2 lb

## 2017-07-04 DIAGNOSIS — Z5111 Encounter for antineoplastic chemotherapy: Secondary | ICD-10-CM

## 2017-07-04 DIAGNOSIS — K1230 Oral mucositis (ulcerative), unspecified: Secondary | ICD-10-CM | POA: Diagnosis not present

## 2017-07-04 DIAGNOSIS — Z7689 Persons encountering health services in other specified circumstances: Secondary | ICD-10-CM

## 2017-07-04 DIAGNOSIS — E119 Type 2 diabetes mellitus without complications: Secondary | ICD-10-CM | POA: Diagnosis not present

## 2017-07-04 DIAGNOSIS — C50512 Malignant neoplasm of lower-outer quadrant of left female breast: Secondary | ICD-10-CM | POA: Diagnosis not present

## 2017-07-04 DIAGNOSIS — R110 Nausea: Secondary | ICD-10-CM

## 2017-07-04 DIAGNOSIS — Z170 Estrogen receptor positive status [ER+]: Secondary | ICD-10-CM | POA: Diagnosis not present

## 2017-07-04 DIAGNOSIS — Z800 Family history of malignant neoplasm of digestive organs: Secondary | ICD-10-CM

## 2017-07-04 DIAGNOSIS — R718 Other abnormality of red blood cells: Secondary | ICD-10-CM

## 2017-07-04 DIAGNOSIS — M129 Arthropathy, unspecified: Secondary | ICD-10-CM | POA: Diagnosis not present

## 2017-07-04 DIAGNOSIS — R5383 Other fatigue: Secondary | ICD-10-CM

## 2017-07-04 DIAGNOSIS — M8580 Other specified disorders of bone density and structure, unspecified site: Secondary | ICD-10-CM | POA: Diagnosis not present

## 2017-07-04 DIAGNOSIS — D649 Anemia, unspecified: Secondary | ICD-10-CM

## 2017-07-04 DIAGNOSIS — Z8542 Personal history of malignant neoplasm of other parts of uterus: Secondary | ICD-10-CM

## 2017-07-04 DIAGNOSIS — C773 Secondary and unspecified malignant neoplasm of axilla and upper limb lymph nodes: Secondary | ICD-10-CM

## 2017-07-04 DIAGNOSIS — C50412 Malignant neoplasm of upper-outer quadrant of left female breast: Secondary | ICD-10-CM

## 2017-07-04 DIAGNOSIS — Z923 Personal history of irradiation: Secondary | ICD-10-CM

## 2017-07-04 DIAGNOSIS — R590 Localized enlarged lymph nodes: Secondary | ICD-10-CM | POA: Diagnosis not present

## 2017-07-04 DIAGNOSIS — Z7984 Long term (current) use of oral hypoglycemic drugs: Secondary | ICD-10-CM

## 2017-07-04 DIAGNOSIS — I10 Essential (primary) hypertension: Secondary | ICD-10-CM

## 2017-07-04 DIAGNOSIS — Z79899 Other long term (current) drug therapy: Secondary | ICD-10-CM

## 2017-07-04 DIAGNOSIS — C50912 Malignant neoplasm of unspecified site of left female breast: Secondary | ICD-10-CM

## 2017-07-04 LAB — CBC WITH DIFFERENTIAL/PLATELET
BASOS PCT: 0 %
Basophils Absolute: 0.1 10*3/uL (ref 0–0.1)
EOS ABS: 0 10*3/uL (ref 0–0.7)
EOS PCT: 0 %
HCT: 29.3 % — ABNORMAL LOW (ref 35.0–47.0)
Hemoglobin: 10.5 g/dL — ABNORMAL LOW (ref 12.0–16.0)
Lymphocytes Relative: 7 %
Lymphs Abs: 1 10*3/uL (ref 1.0–3.6)
MCH: 28.4 pg (ref 26.0–34.0)
MCHC: 35.7 g/dL (ref 32.0–36.0)
MCV: 79.4 fL — AB (ref 80.0–100.0)
MONOS PCT: 12 %
Monocytes Absolute: 1.6 10*3/uL — ABNORMAL HIGH (ref 0.2–0.9)
Neutro Abs: 10.6 10*3/uL — ABNORMAL HIGH (ref 1.4–6.5)
Neutrophils Relative %: 81 %
PLATELETS: 266 10*3/uL (ref 150–440)
RBC: 3.69 MIL/uL — ABNORMAL LOW (ref 3.80–5.20)
RDW: 13.8 % (ref 11.5–14.5)
WBC: 13.2 10*3/uL — AB (ref 3.6–11.0)

## 2017-07-04 LAB — COMPREHENSIVE METABOLIC PANEL
ALK PHOS: 84 U/L (ref 38–126)
ALT: 13 U/L — AB (ref 14–54)
AST: 23 U/L (ref 15–41)
Albumin: 4 g/dL (ref 3.5–5.0)
Anion gap: 10 (ref 5–15)
BUN: 11 mg/dL (ref 6–20)
CALCIUM: 9.5 mg/dL (ref 8.9–10.3)
CHLORIDE: 99 mmol/L — AB (ref 101–111)
CO2: 25 mmol/L (ref 22–32)
CREATININE: 0.75 mg/dL (ref 0.44–1.00)
Glucose, Bld: 158 mg/dL — ABNORMAL HIGH (ref 65–99)
Potassium: 4.2 mmol/L (ref 3.5–5.1)
Sodium: 134 mmol/L — ABNORMAL LOW (ref 135–145)
Total Bilirubin: 0.4 mg/dL (ref 0.3–1.2)
Total Protein: 6.9 g/dL (ref 6.5–8.1)

## 2017-07-04 MED ORDER — DEXAMETHASONE SODIUM PHOSPHATE 100 MG/10ML IJ SOLN
Freq: Once | INTRAMUSCULAR | Status: AC
Start: 2017-07-04 — End: 2017-07-04
  Administered 2017-07-04: 11:00:00 via INTRAVENOUS
  Filled 2017-07-04: qty 5

## 2017-07-04 MED ORDER — VALACYCLOVIR HCL 1 G PO TABS: 1000.0000 mg | ORAL_TABLET | Freq: Two times a day (BID) | ORAL | 0 refills | Status: DC

## 2017-07-04 MED ORDER — SODIUM CHLORIDE 0.9 % IV SOLN
600.0000 mg/m2 | Freq: Once | INTRAVENOUS | Status: AC
  Administered 2017-07-04: 1120 mg via INTRAVENOUS
  Filled 2017-07-04: qty 50

## 2017-07-04 MED ORDER — SODIUM CHLORIDE 0.9% FLUSH
10.0000 mL | INTRAVENOUS | Status: DC | PRN
  Administered 2017-07-04: 10 mL via INTRAVENOUS
  Filled 2017-07-04: qty 10

## 2017-07-04 MED ORDER — CHLORHEXIDINE GLUCONATE 0.12 % MT SOLN: 15.0000 mL | Freq: Two times a day (BID) | OROMUCOSAL | 0 refills | Status: DC

## 2017-07-04 MED ORDER — PEGFILGRASTIM 6 MG/0.6ML ~~LOC~~ PSKT
6.0000 mg | PREFILLED_SYRINGE | Freq: Once | SUBCUTANEOUS | Status: AC
  Administered 2017-07-04: 6 mg via SUBCUTANEOUS
  Filled 2017-07-04: qty 0.6

## 2017-07-04 MED ORDER — HEPARIN SOD (PORK) LOCK FLUSH 100 UNIT/ML IV SOLN
500.0000 [IU] | Freq: Once | INTRAVENOUS | Status: AC
  Administered 2017-07-04: 500 [IU] via INTRAVENOUS
  Filled 2017-07-04: qty 5

## 2017-07-04 MED ORDER — SODIUM CHLORIDE 0.9 % IV SOLN
Freq: Once | INTRAVENOUS | Status: AC
  Administered 2017-07-04: 11:00:00 via INTRAVENOUS
  Filled 2017-07-04: qty 1000

## 2017-07-04 MED ORDER — PALONOSETRON HCL INJECTION 0.25 MG/5ML
0.2500 mg | Freq: Once | INTRAVENOUS | Status: AC
  Administered 2017-07-04: 0.25 mg via INTRAVENOUS
  Filled 2017-07-04: qty 5

## 2017-07-04 MED ORDER — DOXORUBICIN HCL CHEMO IV INJECTION 2 MG/ML
60.0000 mg/m2 | Freq: Once | INTRAVENOUS | Status: AC
  Administered 2017-07-04: 112 mg via INTRAVENOUS
  Filled 2017-07-04: qty 56

## 2017-07-04 NOTE — Progress Notes (Signed)
Patient is here for  treatment check, She states that for past 3 or four days she has been having left sided pain in her tongue, neck and ear. She states there is a small "lump" on the left side of her tongue. She is also complaining of tenderness around her eye lids bilaterally.

## 2017-07-04 NOTE — Progress Notes (Addendum)
[facility] Follow Up Visit.  Patient Care Team: Glendon Axe, MD as PCP - General (Internal Medicine)  CHIEF COMPLAINTS/PURPOSE OF CONSULTATION: History of Uterine Cancer, treated with radiation.   Pertinent Oncology History Dominique Guzman 66 y.o. female with PMH listed as below is referred by Dr.Schermerhorn to [name] for evaluation and management of newly diagnosed breast cancer. Patient had a history a left breast mass biopsy followed by breast excisonal biopsy in 2015 with benign pathology.  She also has a history of Uterine cancer which was treated with RT with Dr.Chrystal.  She underwent routine screening mammogram on 04/17/2017 which recommended a possible mass on the left breast. Left diagnostic mammogram showed a highly suspicious left breast mass at 4 o'clock, 7 cm from the nipple and 1 o'clock,,  About 3 cm from the nipple. Enlarged left axillary lymph node suspicious for metastatic disease.  1 US breast confirmed a left breast mass of 2.1cm, 4 o'clock, and 1.1 cm mass at 1 o'clock.The masses at 1 and 4 o'clock are separated by approximately 4 cm sonographically 2 Biopsy of the two mass and axillary lymph node revealed invasive carcinoma, grade 2. Both are ER,PR positive. HER2 equivocal, FISH pending. 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  3 05/14/2017 MRI breast showed Two sites of biopsy proven malignancy seen in the left breast, which span up to 11.3 cm. There are intervening areas of non mass enhancement and a small suspicious enhancing mass. 4 Case was discussed on breast tumor board on 05/21/2017 and the panel agree with neoadjuvant ddAC to T followed by surgery.  5 Started on ddAC on 05/23/2017.   6 She tolerate AC well and felt some nausea after 2nd cycle of AC. She had 2 syncopal episodes that occurred on Day 12 of cycle 2 ACwhile she was using the restroom. Patient is a history of similar episode in the past before chemotherapy. She was feeling  that her stomach was upset also had loose nonbloody non-melanotic stools. On the commode she started feeling nauseated and actually leaned over to the bathtub to vomit. She then feels that she lost consciousness for a brief second and had fallen in between the commode and the bathtub, hit her head. She called the our office the next morning and I instruct her to go to ER for evaluation. She had CT head which showed no intracranial bleeding. Her electrolytes are WNL and she was sent home.  INTERVAL HISTORY She comes for evaluation before Cycle 4  DdAC.  she is accompanied by her husband. She feels fatigue after chemotherapy. She also had a painful lump on the left side of her tongue.  She used magic mouth wash without improving and also feels her left side neck lymph is swollen.    Denies headache, SOB, chest pain, abdominal pain. Denies any neurological complaint.   Review of Systems  Constitutional: Negative.   HENT:  Negative.        Painful lump on the Tongue   Eyes: Negative.   Respiratory: Negative.   Cardiovascular: Negative.   Gastrointestinal: Negative.   Endocrine: Negative.   Genitourinary: Negative.    Musculoskeletal: Negative.   Skin: Negative.     MEDICAL HISTORY: Past Medical History:  Diagnosis Date  . Anemia   . Arthritis    HIP-LEFT  . Benign neoplasm of colon   . Cancer ([place])   . Diabetes mellitus without complication ([place])   . H/O osteopenia   . Hypertension   .  Sleep apnea    USE CPAP  . Uterine cancer [facility])     SURGICAL HISTORY: Past Surgical History:  Procedure Laterality Date  . ABDOMINAL HYSTERECTOMY    . BREAST BIOPSY Left 04/2014   negative  . BREAST EXCISIONAL BIOPSY Left 2015  . CESAREAN SECTION     X2  . COLONOSCOPY WITH PROPOFOL N/A 04/15/2015   Procedure: COLONOSCOPY WITH PROPOFOL;  Surgeon: Hulen Luster, MD;  Location: [facility] END[facility]COPY;  Service: Gastroenterology;  Laterality: N/A;  . HERNIA REPAIR    . PORTACATH PLACEMENT Right 05/22/2017    Procedure: INSERTION PORT-A-CATH;  Surgeon: Leonie Green, MD;  Location: [facility] ORS;  Service: General;  Laterality: Right;    SOCIAL HISTORY: Social History   Social History  . Marital status: Married    Spouse name: N/A  . Number of children: N/A  . Years of education: N/A   Occupational History  . Not on file.   Social History Main Topics  . Smoking status: Never Smoker  . Smokeless tobacco: Never Used  . Alcohol use No  . Drug use: No  . Sexual activity: Not on file   Other Topics Concern  . Not on file   Social History Narrative  . No narrative on file    FAMILY HISTORY Family History  Problem Relation Age of Onset  . Diabetes Mother   . Diabetes Father   . Diabetes Sister   . Breast cancer Neg Hx     ALLERGIES:  has No Known Allergies.  MEDICATIONS:  Current Outpatient Prescriptions  Medication Sig Dispense Refill  . Calcium Carb-Cholecalciferol (CALCIUM 600-D PO) Take 1 tablet by mouth daily.    [name] CINNAMON PO Take 2 capsules by mouth daily.    [name] FIBER PO Take 1 capsule by mouth daily.    . fluticasone (FLONASE) 50 MCG/ACT nasal spray Place 2 sprays into both nostrils as needed.     . Glucosamine HCl (GLUCOSAMINE PO) Take 1 tablet by mouth daily.    [name] lidocaine-prilocaine (EMLA) cream Apply 1 application topically as needed. Apply small amount to port site at least 1 hour prior to it being accessed, cover with plastic wrap 30 g 3  . lisinopril-hydrochlorothiazide (PRINZIDE,ZESTORETIC) 20-12.5 MG per tablet Take 1 tablet by mouth daily.    [name] loperamide (IMODIUM) 2 MG capsule Take 1 capsule (2 mg total) by mouth See admin instructions. With onset of loose stool, take 18m followed by 280mevery 2 hours until 12 hours have passed without loose bowel movement. Maximum: 16 mg/day 120 capsule 1  . metFORMIN (GLUCOPHAGE) 500 MG tablet Take 1,000 mg by mouth 2 (two) times daily.     . Multiple Vitamin (MULTIVITAMIN) tablet Take 1 tablet by mouth daily.    .  ondansetron (ZOFRAN) 8 MG tablet Take 1 tablet (8 mg total) by mouth every 8 (eight) hours as needed for nausea or vomiting. 120 tablet 0  . sitaGLIPtin (JANUVIA) 100 MG tablet Take 100 mg by mouth daily.     No current facility-administered medications for this visit.    Facility-Administered Medications Ordered in Other Visits  Medication Dose Route Frequency Provider Last Rate Last Dose  . heparin lock flush 100 unit/mL  500 Units Intravenous Once YuEarlie ServerMD      . sodium chloride flush (NS) 0.9 % injection 10 mL  10 mL Intravenous PRN YuEarlie ServerMD   10 mL at 07/04/17 0914    PHYSICAL EXAMINATION:  ECOG PERFORMANCE STATUS: 0 -  Asymptomatic  Vitals:   07/04/17 0933  BP: 115/79  Pulse: 89  Resp: 16  Temp: 98.5 F (36.9 C)    Filed Weights   07/04/17 0933  Weight: 185 lb 3.2 oz (84 kg)     Physical Exam GENERAL: No distress, well nourished.  SKIN:  No rashes or significant lesions  HEAD: Normocephalic, No masses, lesions, tenderness or abnormalities. Bruises on her right forehead and scalp.  EYES: Conjunctiva are pink, non icteric ENT: External ears normal ,lips , buccal mucosa, and tongue normal and mucous membranes are moist. A small tender lump on left side of her tongue.[name]  LYMPH: No palpable cervical and axillary lymphadenopathy  LUNGS: Clear to auscultation, no crackles or wheezes  (+) Medi port right anterior chest wall HEART: Regular rate & rhythm, no murmurs, no gallops, S1 normal and S2 normal  ABDOMEN: Abdomen soft, non-tender, normal bowel sounds, I did not appreciate any  masses or organomegaly  MUSCULOSKELETAL: No CVA tenderness and no tenderness on percussion of the back or rib cage.  EXTREMITIES: No edema, no skin discoloration or tenderness NEURO: Alert & oriented, no focal motor/sensory deficits. Breast exam was performed in lying down position. No evidence of any palpable masses or lumps in the right breast. No evidence of right axillary adenopathy. Left  breast axillary lymph node not palpable.  I can barely feel her left breast outer quadrant mass.  LABORATORY DATA: No recent labs.  CBC    Component Value Date/Time   WBC 13.2 (H) 07/04/2017 0910   RBC 3.69 (L) 07/04/2017 0910   HGB 10.5 (L) 07/04/2017 0910   HCT 29.3 (L) 07/04/2017 0910   PLT 266 07/04/2017 0910   MCV 79.4 (L) 07/04/2017 0910   MCH 28.4 07/04/2017 0910   MCHC 35.7 07/04/2017 0910   RDW 13.8 07/04/2017 0910   LYMPHSABS 1.0 07/04/2017 0910   MONOABS 1.6 (H) 07/04/2017 0910   EOSABS 0.0 07/04/2017 0910   BASOSABS 0.1 07/04/2017 0910   CMP Latest Ref Rng & Units 06/20/2017 06/19/2017 06/06/2017  Glucose 65 - 99 mg/dL 184(H) 170(H) 159(H)  BUN 6 - 20 mg/dL _0 Creatinine 0.44 - 1.00 mg/dL 0.71 0.80 0.67  Sodium 135 - 145 mmol/L 134(L) 133(L) 136  Potassium 3.5 - 5.1 mmol/L 3.8 4.3 4.1  Chloride 101 - 111 mmol/L 101 98(L) 101  CO2 22 - 32 mmol/L _1 Calcium 8.9 - 10.3 mg/dL 9.1 9.8 9.6  Total Protein 6.5 - 8.1 g/dL 6.4(L) - 7.0  Total Bilirubin 0.3 - 1.2 mg/dL 0.3 - 0.3  Alkaline Phos 38 - 126 U/L 84 - 89  AST 15 - 41 U/L 23 - 21  ALT 14 - 54 U/L 16 - 14   RADIOGRAPHIC STUDIES: I have personally reviewed the radiological images as listed and agree with the findings in the report 04/25/2017 Mammogram Diagnostic Targeted ultrasound of the left breast demonstrates an irregular, hypoechoic mass at 1 o'clock, 3 cm from the nipple measuring 10 x 10 x 10 mm corresponding to the mass seen within the anterior upper, outer left breast. At 4 o'clock, 7 cm from the nipple, there is an irregular hypoechoic mass measuring 2.1 x 1.5 x 1.9 cm. Targeted ultrasound of the left axilla demonstrates an enlarged, abnormal axillary lymph node demonstrating cortical thickening up to 6 mm. The masses at 1 and 4 o'clock are separated by approximately 4 cm sonographically. IMPRESSION: Highly suspicious left breast masses at 4 o'clock, 7 cm from the  nipple and 1 o'clock, 3  cm from the nipple. Enlarged left axillary lymph node suspicious for metastatic disease. US breast 04/25/2017 FINDINGS: There is an irregular spiculated mass in the slightly lower, outer left breast, posterior depth measuring approximately 2.6 x 2.5 x 1.9 cm. There is an additional irregular mass within the upper, outerleft breast, anterior depth measuring approximately 1 cm in size, which is suspicious for a satellite lesion. There is asymmetric density between the 2 masses suggesting contiguous spread of disease. Mammographically, the masses are separated by approximately 6 cm and in total span approximately 9.6 cm.  Mammographic images were processed with CAD.  Targeted ultrasound of the left breast demonstrates an irregular, hypoechoic mass at 1 o'clock, 3 cm from the nipple measuring 10 x 10 x 10 mm corresponding to the mass seen within the anterior upper, outer left breast. At 4 o'clock, 7 cm from the nipple, there is an irregular hypoechoic mass measuring 2.1 x 1.5 x 1.9 cm. Targeted ultrasound of the left axilla demonstrates an enlarged, abnormal axillary lymph node demonstrating cortical thickening up to 6 mm. The masses at 1 and 4 o'clock are separated by approximately 4 cm sonographically. IMPRESSION: Highly suspicious left breast masses at 4 o'clock, 7 cm from the nipple and 1 o'clock, 3 cm from the nipple. Enlarged left axillary lymph node suspicious for metastatic disease.  Pathology DIAGNOSIS:  A. BREAST, LEFT, 4 O'CLOCK 7 CM FROM NIPPLE; ULTRASOUND-GUIDED CORE BIOPSY:  - INVASIVE MAMMARY CARCINOMA, NO SPECIAL TYPE, GRADE 2, SEE COMMENT.  - INVASIVE CARCINOMA MEASURES 15 MM IN THIS SAMPLE.  B. BREAST, LEFT, 1 O'CLOCK, 3 CM FROM NIPPLE; ULTRASOUND-GUIDED CORE  BIOPSY:  - INVASIVE MAMMARY CARCINOMA, NO SPECIAL TYPE, GRADE 2, SEE COMMENT.  - INVASIVE CARCINOMA MEASURES 10 MM IN THIS SAMPLE.  C. LYMPH NODE, LEFT AXILLA; ULTRASOUND-GUIDED CORE BIOPSY:  - METASTATIC  CARCINOMA, 7 MM METASTASIS, SEE COMMENT.    BREAST BIOMARKER TEST RESULTS - TUMOR A, 4 O'CLOCK:  Estrogen Receptor (ER) Status: POSITIVE, >90% of cells with nuclear  positivity     Average intensity of staining: Strong  Progesterone Receptor (PR) Status: POSITIVE, >90% of cells with nuclear  positivity     Average intensity of staining: Strong  HER2 (by immunohistochemistry): Equivocal (score 2+)  Percentage of cells with uniform intense complete membrane staining: 1%   BREAST BIOMARKER TEST RESULTS - TUMOR B, 1 O'CLOCK:  Estrogen Receptor (ER) Status: POSITIVE, >90% of cells with nuclear  positivity     Average intensity of staining: Strong  Progesterone Receptor (PR) Status: POSITIVE, 51-90% of cells with  nuclear positivity     Average intensity of staining: Moderate to strong  HER2 (by immunohistochemistry): Equivocal (score 2+)  Percentage of cells with uniform intense complete membrane staining: 2%    ADDENDUM #2   BREAST BIOMARKER TEST: HER2 FISH - TUMOR A, 4 O'CLOCK:  HER2 (ERBB2) (by in situ hybridization): NEGATIVE (not amplified)  Number of observers: 2  Number of invasive tumor cells counted: 40  Dual probe assay  Average number of HER2 signals per cell: 3.6  Average number of CEP17 signals per cell: 2.5  HER2/CEP17 ratio: 1.42   05/14/2017 MRI breast IMPRESSION: 1. Two sites of biopsy proven malignancy seen in the left breast, which span up to 11.3 cm. There are intervening areas of non mass enhancement and a small suspicious enhancing mass. 2. The biopsy-proven metastatic left axillary lymph node is identified. There are 3 other prominent left axillary lymph nodes which are  not definitely pathologically enlarged. 3.  No evidence of right breast malignancy  ASSESSMENT/PLAN 66yo female presents with cT3cN1cMo left breast multifocal breast invasive mammary carcinoma, ER+, PR+ HER2 negative by FISH, currently on neoadjuvant chemotherapy, s/p cycle 1  ddAC.  Cancer Staging Malignant neoplasm of lower-outer quadrant of left breast of female, estrogen receptor positive ([place]) Staging form: Breast, AJCC 8th Edition - Clinical stage from 04/25/2017: Stage IIA (cT3(m), cN1(f), cM0, G2, ER: Positive, PR: Positive, HER2: Equivocal) - Signed by Earlie Server, MD on 05/23/2017  Malignant neoplasm of upper-outer quadrant of left breast in female, estrogen receptor positive ([place]) Staging form: Breast, AJCC 8th Edition - Clinical: No stage assigned - Unsigned  1. Malignant neoplasm of upper-outer quadrant of left breast in female, estrogen receptor positive ([place])   2. Malignant neoplasm of lower-outer quadrant of left breast of female, estrogen receptor positive ([place])   3. 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 Encounter for antineoplastic chemotherapy   5. Mucositis   6. History of uterine cancer   7. RBC microcytosis   8. Carcinoma of left breast, estrogen and progesterone receptor positive ([place])    1  Multifocal left breast cancer, ER+PR+, HER2-, On MRI the mass spans about 11.3 cm area, which could upstage her to cT3N1M0, one of the mass is associated with LVI and DCIS.  s/p cycle 3 ddAC. Tolerates well. Labs reviewed. OK to proceed to cycle 4 ddAC today.  2  Onpro today after chemotherapy.   # check CBC, CMP Prior to each treatment cycles.  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 Also Peridex mouth wash to reduce bacteria load.  # Repeat MRI breast left to evaluate treatment response.  I will see her on day 1 cycle 1 weekly Taxol.   Orders Placed This Encounter  Procedures  . MR BREAST LEFT W WO CONTRAST    Standing Status:   Future    Standing Expiration Date:   09/03/2018    Order Specific Question:   If indicated for the ordered procedure, I authorize the administration of contrast media per  Radiology protocol    Answer:   Yes    Order Specific Question:   What is the patient's sedation requirement?    Answer:   No Sedation    Order Specific Question:   Does the patient have a pacemaker or implanted devices?    Answer:   No    Order Specific Question:   Radiology Contrast Protocol - do NOT remove file path    Answer:   _0 [email or web address]    Order Specific Question:   Preferred imaging location?    Answer:   [facility] (table limit-300lbs)    All questions were answered. The patient knows to call the clinic with any problems, questions or concerns. Total face to face encounter time for this patient visit was 25 min. >50% of the time was  spent in counseling and coordination of care.    Earlie Server, MD  07/04/2017 9:27 AM

## 2017-07-06 ENCOUNTER — Other Ambulatory Visit: Payer: Self-pay | Admitting: Oncology

## 2017-07-06 DIAGNOSIS — C50919 Malignant neoplasm of unspecified site of unspecified female breast: Secondary | ICD-10-CM

## 2017-07-11 ENCOUNTER — Ambulatory Visit (HOSPITAL_COMMUNITY): Admission: RE | Admit: 2017-07-11 | Payer: BLUE CROSS/BLUE SHIELD | Source: Ambulatory Visit

## 2017-07-11 ENCOUNTER — Ambulatory Visit (HOSPITAL_COMMUNITY)
Admission: RE | Admit: 2017-07-11 | Discharge: 2017-07-11 | Disposition: A | Discharge status: Home / Self Care | Payer: BLUE CROSS/BLUE SHIELD | Source: Ambulatory Visit | Attending: Oncology | Admitting: Oncology

## 2017-07-11 DIAGNOSIS — C50912 Malignant neoplasm of unspecified site of left female breast: Secondary | ICD-10-CM | POA: Diagnosis not present

## 2017-07-11 DIAGNOSIS — C50919 Malignant neoplasm of unspecified site of unspecified female breast: Secondary | ICD-10-CM

## 2017-07-11 MED ORDER — GADOBENATE DIMEGLUMINE 529 MG/ML IV SOLN
20.0000 mL | Freq: Once | INTRAVENOUS | Status: AC | PRN
  Administered 2017-07-11: 17 mL via INTRAVENOUS

## 2017-07-16 ENCOUNTER — Other Ambulatory Visit: Payer: Self-pay | Admitting: *Deleted

## 2017-07-18 ENCOUNTER — Encounter: Payer: Self-pay | Admitting: Oncology

## 2017-07-18 ENCOUNTER — Inpatient Hospital Stay: Payer: BLUE CROSS/BLUE SHIELD | Attending: Oncology | Admitting: Oncology

## 2017-07-18 ENCOUNTER — Inpatient Hospital Stay: Payer: BLUE CROSS/BLUE SHIELD

## 2017-07-18 VITALS — BP 109/71 | HR 90

## 2017-07-18 VITALS — BP 105/67 | HR 92 | Temp 98.6°F | Wt 186.3 lb

## 2017-07-18 DIAGNOSIS — E119 Type 2 diabetes mellitus without complications: Secondary | ICD-10-CM | POA: Diagnosis not present

## 2017-07-18 DIAGNOSIS — C50412 Malignant neoplasm of upper-outer quadrant of left female breast: Secondary | ICD-10-CM | POA: Diagnosis present

## 2017-07-18 DIAGNOSIS — C50512 Malignant neoplasm of lower-outer quadrant of left female breast: Secondary | ICD-10-CM

## 2017-07-18 DIAGNOSIS — R590 Localized enlarged lymph nodes: Secondary | ICD-10-CM | POA: Insufficient documentation

## 2017-07-18 DIAGNOSIS — I10 Essential (primary) hypertension: Secondary | ICD-10-CM | POA: Diagnosis not present

## 2017-07-18 DIAGNOSIS — K1230 Oral mucositis (ulcerative), unspecified: Secondary | ICD-10-CM | POA: Diagnosis not present

## 2017-07-18 DIAGNOSIS — Z170 Estrogen receptor positive status [ER+]: Secondary | ICD-10-CM | POA: Diagnosis not present

## 2017-07-18 DIAGNOSIS — G4730 Sleep apnea, unspecified: Secondary | ICD-10-CM

## 2017-07-18 DIAGNOSIS — C773 Secondary and unspecified malignant neoplasm of axilla and upper limb lymph nodes: Secondary | ICD-10-CM | POA: Diagnosis not present

## 2017-07-18 DIAGNOSIS — R5383 Other fatigue: Secondary | ICD-10-CM | POA: Insufficient documentation

## 2017-07-18 DIAGNOSIS — Z7984 Long term (current) use of oral hypoglycemic drugs: Secondary | ICD-10-CM | POA: Diagnosis not present

## 2017-07-18 DIAGNOSIS — C50912 Malignant neoplasm of unspecified site of left female breast: Secondary | ICD-10-CM

## 2017-07-18 DIAGNOSIS — Z5111 Encounter for antineoplastic chemotherapy: Secondary | ICD-10-CM | POA: Diagnosis not present

## 2017-07-18 DIAGNOSIS — Z923 Personal history of irradiation: Secondary | ICD-10-CM

## 2017-07-18 DIAGNOSIS — Z9989 Dependence on other enabling machines and devices: Secondary | ICD-10-CM

## 2017-07-18 DIAGNOSIS — M8580 Other specified disorders of bone density and structure, unspecified site: Secondary | ICD-10-CM

## 2017-07-18 DIAGNOSIS — Z79899 Other long term (current) drug therapy: Secondary | ICD-10-CM

## 2017-07-18 DIAGNOSIS — R5381 Other malaise: Secondary | ICD-10-CM | POA: Insufficient documentation

## 2017-07-18 DIAGNOSIS — Z8542 Personal history of malignant neoplasm of other parts of uterus: Secondary | ICD-10-CM | POA: Diagnosis not present

## 2017-07-18 DIAGNOSIS — M1990 Unspecified osteoarthritis, unspecified site: Secondary | ICD-10-CM | POA: Diagnosis not present

## 2017-07-18 LAB — COMPREHENSIVE METABOLIC PANEL
ALK PHOS: 89 U/L (ref 38–126)
ALT: 11 U/L — AB (ref 14–54)
ANION GAP: 10 (ref 5–15)
AST: 25 U/L (ref 15–41)
Albumin: 3.8 g/dL (ref 3.5–5.0)
BILIRUBIN TOTAL: 0.4 mg/dL (ref 0.3–1.2)
BUN: 9 mg/dL (ref 6–20)
CALCIUM: 9.4 mg/dL (ref 8.9–10.3)
CO2: 25 mmol/L (ref 22–32)
Chloride: 101 mmol/L (ref 101–111)
Creatinine, Ser: 0.58 mg/dL (ref 0.44–1.00)
GFR calc Af Amer: >60 mL/min (ref >60)
GFR calc non Af Amer: >60 mL/min (ref >60)
GLUCOSE: 141 mg/dL — AB (ref 65–99)
Potassium: 3.7 mmol/L (ref 3.5–5.1)
Sodium: 136 mmol/L (ref 135–145)
TOTAL PROTEIN: 6.4 g/dL — AB (ref 6.5–8.1)

## 2017-07-18 LAB — CBC WITH DIFFERENTIAL/PLATELET
BASOS PCT: 0 %
Basophils Absolute: 0.1 10*3/uL (ref 0–0.1)
EOS PCT: 0 %
Eosinophils Absolute: 0 10*3/uL (ref 0–0.7)
HEMATOCRIT: 24.1 % — AB (ref 35.0–47.0)
Hemoglobin: 8.5 g/dL — ABNORMAL LOW (ref 12.0–16.0)
Lymphocytes Relative: 4 %
Lymphs Abs: 0.8 10*3/uL — ABNORMAL LOW (ref 1.0–3.6)
MCH: 29.2 pg (ref 26.0–34.0)
MCHC: 35.2 g/dL (ref 32.0–36.0)
MCV: 82.9 fL (ref 80.0–100.0)
MONO ABS: 2 10*3/uL — AB (ref 0.2–0.9)
MONOS PCT: 11 %
NEUTROS ABS: 15.8 10*3/uL — AB (ref 1.4–6.5)
Neutrophils Relative %: 85 %
PLATELETS: 194 10*3/uL (ref 150–440)
RBC: 2.91 MIL/uL — ABNORMAL LOW (ref 3.80–5.20)
RDW: 16.2 % — AB (ref 11.5–14.5)
WBC: 18.7 10*3/uL — ABNORMAL HIGH (ref 3.6–11.0)

## 2017-07-18 MED ORDER — PACLITAXEL CHEMO INJECTION 300 MG/50ML
80.0000 mg/m2 | Freq: Once | INTRAVENOUS | Status: AC
  Administered 2017-07-18: 150 mg via INTRAVENOUS
  Filled 2017-07-18: qty 25

## 2017-07-18 MED ORDER — CHLORHEXIDINE GLUCONATE 0.12 % MT SOLN: 15.0000 mL | Freq: Two times a day (BID) | OROMUCOSAL | 0 refills | Status: DC

## 2017-07-18 MED ORDER — SODIUM CHLORIDE 0.9 % IV SOLN
20.0000 mg | Freq: Once | INTRAVENOUS | Status: AC
  Administered 2017-07-18: 20 mg via INTRAVENOUS
  Filled 2017-07-18: qty 2

## 2017-07-18 MED ORDER — SODIUM CHLORIDE 0.9% FLUSH
10.0000 mL | INTRAVENOUS | Status: DC | PRN
  Administered 2017-07-18: 10 mL via INTRAVENOUS
  Filled 2017-07-18: qty 10

## 2017-07-18 MED ORDER — FAMOTIDINE IN NACL 20-0.9 MG/50ML-% IV SOLN
20.0000 mg | Freq: Once | INTRAVENOUS | Status: AC
  Administered 2017-07-18: 20 mg via INTRAVENOUS
  Filled 2017-07-18: qty 50

## 2017-07-18 MED ORDER — SODIUM CHLORIDE 0.9 % IV SOLN
Freq: Once | INTRAVENOUS | Status: AC
  Administered 2017-07-18: 10:00:00 via INTRAVENOUS
  Filled 2017-07-18: qty 1000

## 2017-07-18 MED ORDER — DIPHENHYDRAMINE HCL 50 MG/ML IJ SOLN
50.0000 mg | Freq: Once | INTRAMUSCULAR | Status: AC
  Administered 2017-07-18: 50 mg via INTRAVENOUS
  Filled 2017-07-18: qty 1

## 2017-07-18 MED ORDER — HEPARIN SOD (PORK) LOCK FLUSH 100 UNIT/ML IV SOLN
500.0000 [IU] | Freq: Once | INTRAVENOUS | Status: AC
  Administered 2017-07-18: 500 [IU] via INTRAVENOUS
  Filled 2017-07-18: qty 5

## 2017-07-18 MED ORDER — SODIUM CHLORIDE 0.9% FLUSH
10.0000 mL | INTRAVENOUS | Status: DC | PRN
  Filled 2017-07-18: qty 10

## 2017-07-18 MED ORDER — HEPARIN SOD (PORK) LOCK FLUSH 100 UNIT/ML IV SOLN: 500.0000 [IU] | Freq: Once | INTRAVENOUS | Status: DC | PRN

## 2017-07-18 NOTE — Progress Notes (Signed)
Patient here today for follow up.   

## 2017-07-18 NOTE — Progress Notes (Signed)
[facility] [facility] Follow Up Visit.  Patient Care Team: Glendon Axe, MD as PCP - General (Internal Medicine)  CHIEF COMPLAINTS/PURPOSE OF CONSULTATION: History of Uterine Cancer, treated with radiation.   Pertinent Oncology History Dominique Guzman 66 y.o. female with PMH listed as below is referred by Dr.Schermerhorn to [name] for evaluation and management of newly diagnosed breast cancer. Patient had a history a left breast mass biopsy followed by breast excisonal biopsy in 2015 with benign pathology.  She also has a history of Uterine cancer which was treated with RT with Dr.Chrystal.  She underwent routine screening mammogram on 04/17/2017 which recommended a possible mass on the left breast. Left diagnostic mammogram showed a highly suspicious left breast mass at 4 o'clock, 7 cm from the nipple and 1 o'clock,,  About 3 cm from the nipple. Enlarged left axillary lymph node suspicious for metastatic disease.  1 US breast confirmed a left breast mass of 2.1cm, 4 o'clock, and 1.1 cm mass at 1 o'clock.The masses at 1 and 4 o'clock are separated by approximately 4 cm sonographically 2 Biopsy of the two mass and axillary lymph node revealed invasive carcinoma, grade 2. Both are ER,PR positive. HER2 equivocal, FISH pending. 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  3 05/14/2017 MRI breast showed Two sites of biopsy proven malignancy seen in the left breast, which span up to 11.3 cm. There are intervening areas of non mass enhancement and a small suspicious enhancing mass. 4 Case was discussed on breast tumor board on 05/21/2017 and the panel agree with neoadjuvant ddAC to T followed by surgery.  5 s/p 4 cycles ddAC.     INTERVAL HISTORY She comes for evaluation before Cycle 1 weekly Taxol. S/p 4 cycles of ddAC..  she is accompanied by her husband. Reports that the painful lump on the left side of her tongue improved after starting the magic mouth wash and finished the course  of valtrex.     Denies headache, SOB, chest pain, abdominal pain. Denies any neurological complaint.   Review of Systems - Oncology Constitutional: Negative for fever, night sweats,unintentional weight loss, change in appetite. HENT: Negative for ear pain, hearing loss, nasal bleeding Eyes: Negative for eye pain, double vision   Respiratory: Negative for wheezing, shortness of breath, cough Cardiovascular: Negative for chest pain, palpitation.   Gastrointestinal: Negative abdominal pain, diarrhea, nausea vomiting Endocrine: Negative  Genitourinary: Negative for dysuria, hematuria, frequency Skin: Negative for rash, iching, bruising Neurological: Negative for headache, dizziness, seizure Hematological: Negative for easy bruising/bleeding, lymph node enlargement Psychiatric/Behavioral: Negative for depression, anxiety, suicidality  MEDICAL HISTORY: Past Medical History:  Diagnosis Date  . Anemia   . Arthritis    HIP-LEFT  . Benign neoplasm of colon   . Cancer ([place])   . Diabetes mellitus without complication ([place])   . H/O osteopenia   . Hypertension   . Sleep apnea    USE CPAP  . Uterine cancer [facility])     SURGICAL HISTORY: Past Surgical History:  Procedure Laterality Date  . ABDOMINAL HYSTERECTOMY    . BREAST BIOPSY Left 04/2014   negative  . BREAST EXCISIONAL BIOPSY Left 2015  . CESAREAN SECTION     X2  . COLONOSCOPY WITH PROPOFOL N/A 04/15/2015   Procedure: COLONOSCOPY WITH PROPOFOL;  Surgeon: Hulen Luster, MD;  Location: [facility] ENDOSCOPY;  Service: Gastroenterology;  Laterality: N/A;  . HERNIA REPAIR    . PORTACATH PLACEMENT Right 05/22/2017   Procedure: INSERTION PORT-A-CATH;  Surgeon:  Leonie Green, MD;  Location: [facility] ORS;  Service: General;  Laterality: Right;    SOCIAL HISTORY: Social History   Social History  . Marital status: Married    Spouse name: N/A  . Number of children: N/A  . Years of education: N/A   Occupational History  . Not on file.    Social History Main Topics  . Smoking status: Never Smoker  . Smokeless tobacco: Never Used  . Alcohol use No  . Drug use: No  . Sexual activity: Not on file   Other Topics Concern  . Not on file   Social History Narrative  . No narrative on file    FAMILY HISTORY Family History  Problem Relation Age of Onset  . Diabetes Mother   . Diabetes Father   . Diabetes Sister   . Breast cancer Neg Hx     ALLERGIES:  has No Known Allergies.  MEDICATIONS:  Current Outpatient Prescriptions  Medication Sig Dispense Refill  . Calcium Carb-Cholecalciferol (CALCIUM 600-D PO) Take 1 tablet by mouth daily.    . chlorhexidine (PERIDEX) 0.12 % solution Use as directed 15 mLs in the mouth or throat 2 (two) times daily. 473 mL 0  . CINNAMON PO Take 2 capsules by mouth daily.    [name] FIBER PO Take 1 capsule by mouth daily.    . fluticasone (FLONASE) 50 MCG/ACT nasal spray Place 2 sprays into both nostrils as needed.     . Glucosamine HCl (GLUCOSAMINE PO) Take 1 tablet by mouth daily.    [name] lidocaine-prilocaine (EMLA) cream Apply 1 application topically as needed. Apply small amount to port site at least 1 hour prior to it being accessed, cover with plastic wrap 30 g 3  . lisinopril-hydrochlorothiazide (PRINZIDE,ZESTORETIC) 20-12.5 MG per tablet Take 1 tablet by mouth daily.    [name] loperamide (IMODIUM) 2 MG capsule Take 1 capsule (2 mg total) by mouth See admin instructions. With onset of loose stool, take 46m followed by 248mevery 2 hours until 12 hours have passed without loose bowel movement. Maximum: 16 mg/day 120 capsule 1  . magic mouthwash SOLN SWISH AND SPIT 5 TO 10 MLS 4 TIMES A DAY AS NEEDED  3  . metFORMIN (GLUCOPHAGE) 500 MG tablet Take 1,000 mg by mouth 2 (two) times daily.     . Multiple Vitamin (MULTIVITAMIN) tablet Take 1 tablet by mouth daily.    . ondansetron (ZOFRAN) 8 MG tablet Take 1 tablet (8 mg total) by mouth every 8 (eight) hours as needed for nausea or vomiting. 120 tablet 0   . sitaGLIPtin (JANUVIA) 100 MG tablet Take 100 mg by mouth daily.    . valACYclovir (VALTREX) 1000 MG tablet Take 1 tablet (1,000 mg total) by mouth 2 (two) times daily. 14 tablet 0   No current facility-administered medications for this visit.    Facility-Administered Medications Ordered in Other Visits  Medication Dose Route Frequency Provider Last Rate Last Dose  . heparin lock flush 100 unit/mL  500 Units Intracatheter Once PRN YuEarlie ServerMD      . sodium chloride flush (NS) 0.9 % injection 10 mL  10 mL Intravenous PRN YuEarlie ServerMD   10 mL at 07/18/17 0834  . sodium chloride flush (NS) 0.9 % injection 10 mL  10 mL Intracatheter PRN YuEarlie ServerMD        PHYSICAL EXAMINATION:  ECOG PERFORMANCE STATUS: 0 - Asymptomatic  Vitals:   07/18/17 0854  BP: 105/67  Pulse: 92  Temp: 98.6 F (37 C)    Filed Weights   07/18/17 0854  Weight: 186 lb 5 oz (84.5 kg)     Physical Exam  GENERAL: No distress, well nourished.  SKIN:  No rashes or significant lesions  HEAD: Normocephalic, No masses, lesions, tenderness or abnormalities  EYES: Conjunctiva are pink, non icteric ENT: External ears normal ,lips , buccal mucosa, and tongue normal and mucous membranes are moist  LYMPH: No palpable cervical and axillary lymphadenopathy  LUNGS: Clear to auscultation, no crackles or wheezes HEART: Regular rate & rhythm, no murmurs, no gallops, S1 normal and S2 normal  ABDOMEN: Abdomen soft, non-tender, normal bowel sounds, I did not appreciate any  masses or organomegaly  MUSCULOSKELETAL: No CVA tenderness and no tenderness on percussion of the back or rib cage.  EXTREMITIES: No edema, no skin discoloration or tenderness NEURO: Alert & oriented, no focal motor/sensory deficits. Breast exam was performed in lying down position. No evidence of any palpable masses or lumps in the right breast. No evidence of right axillary adenopathy. Left breast axillary lymph node not palpable.  I can barely feel her  left breast outer quadrant mass.  LABORATORY DATA: No recent labs.  CBC    Component Value Date/Time   WBC 18.7 (H) 07/18/2017 0835   RBC 2.91 (L) 07/18/2017 0835   HGB 8.5 (L) 07/18/2017 0835   HCT 24.1 (L) 07/18/2017 0835   PLT 194 07/18/2017 0835   MCV 82.9 07/18/2017 0835   MCH 29.2 07/18/2017 0835   MCHC 35.2 07/18/2017 0835   RDW 16.2 (H) 07/18/2017 0835   LYMPHSABS 0.8 (L) 07/18/2017 0835   MONOABS 2.0 (H) 07/18/2017 0835   EOSABS 0.0 07/18/2017 0835   BASOSABS 0.1 07/18/2017 0835   CMP Latest Ref Rng & Units 07/18/2017 07/04/2017 06/20/2017  Glucose 65 - 99 mg/dL 141(H) 158(H) 184(H)  BUN 6 - 20 mg/dL _0 Creatinine 0.44 - 1.00 mg/dL 0.58 0.75 0.71  Sodium 135 - 145 mmol/L 136 134(L) 134(L)  Potassium 3.5 - 5.1 mmol/L 3.7 4.2 3.8  Chloride 101 - 111 mmol/L 101 99(L) 101  CO2 22 - 32 mmol/L _1 Calcium 8.9 - 10.3 mg/dL 9.4 9.5 9.1  Total Protein 6.5 - 8.1 g/dL 6.4(L) 6.9 6.4(L)  Total Bilirubin 0.3 - 1.2 mg/dL 0.4 0.4 0.3  Alkaline Phos 38 - 126 U/L 89 84 84  AST 15 - 41 U/L _2 ALT 14 - 54 U/L 11(L) 13(L) 16   RADIOGRAPHIC STUDIES: I have personally reviewed the radiological images as listed and agree with the findings in the report 04/25/2017 Mammogram Diagnostic Targeted ultrasound of the left breast demonstrates an irregular, hypoechoic mass at 1 o'clock, 3 cm from the nipple measuring 10 x 10 x 10 mm corresponding to the mass seen within the anterior upper, outer left breast. At 4 o'clock, 7 cm from the nipple, there is an irregular hypoechoic mass measuring 2.1 x 1.5 x 1.9 cm. Targeted ultrasound of the left axilla demonstrates an enlarged, abnormal axillary lymph node demonstrating cortical thickening up to 6 mm. The masses at 1 and 4 o'clock are separated by approximately 4 cm sonographically. IMPRESSION: Highly suspicious left breast masses at 4 o'clock, 7 cm from the nipple and 1 o'clock, 3 cm from the nipple. Enlarged left  axillary lymph node suspicious for metastatic disease. US breast 04/25/2017 FINDINGS: There is an irregular spiculated mass in the slightly lower, outer left breast, posterior depth measuring approximately  2.6 x 2.5 x 1.9 cm. There is an additional irregular mass within the upper, outerleft breast, anterior depth measuring approximately 1 cm in size, which is suspicious for a satellite lesion. There is asymmetric density between the 2 masses suggesting contiguous spread of disease. Mammographically, the masses are separated by approximately 6 cm and in total span approximately 9.6 cm.  Mammographic images were processed with CAD.  Targeted ultrasound of the left breast demonstrates an irregular, hypoechoic mass at 1 o'clock, 3 cm from the nipple measuring 10 x 10 x 10 mm corresponding to the mass seen within the anterior upper, outer left breast. At 4 o'clock, 7 cm from the nipple, there is an irregular hypoechoic mass measuring 2.1 x 1.5 x 1.9 cm. Targeted ultrasound of the left axilla demonstrates an enlarged, abnormal axillary lymph node demonstrating cortical thickening up to 6 mm. The masses at 1 and 4 o'clock are separated by approximately 4 cm sonographically. IMPRESSION: Highly suspicious left breast masses at 4 o'clock, 7 cm from the nipple and 1 o'clock, 3 cm from the nipple. Enlarged left axillary lymph node suspicious for metastatic disease.  07/11/2017 MRI breast Bilateral FINDINGS: Breast composition: b. Scattered fibroglandular tissue. Background parenchymal enhancement: Minimal.  Right breast: No significant change in several small nodules possibly representing normal intramammary lymph nodes, mammographically stable.  Left breast: The previously demonstrated 2.6 x 2.2 x 2.0 cm enhancing mass in the posterior aspect of the upper-outer quadrant of the left breast demonstrates significantly less enhancement. This is currently very low-grade with persistent enhancement  kinetics. This currently measures 2.6 x 1.6 x 1.6 cm on image number 131 of series 10603 and in the sagittal plane.  The previously demonstrated 1.7 x 1.3 x 0.9 cm anterior enhancing mass containing a biopsy marker clip artifact currently measures 1.4 x 1.0 x 0.6 cm on image number 132 of series 10603 and in the sagittal plane. This also demonstrates some decrease in enhancement with a mixture of enhancement kinetics, including rapid wash-in/ washout.  There are are 2 additional linear areas of non mass enhancement between these areas, currently measuring 3.9 cm in length on axial image number 121 and 3.0 cm in length on axial image number 113 of the same series.  The 1.4 x 0.7 x 0.5 cm third enhancing mass enhancement between the 2 biopsied areas previously is significantly smaller, currently measuring 0.7 x 0.3 x 0.2 cm on image number 99 of the same series and in the sagittal plane.  The residual enhancing areas in the left breast span an area measuring 10.1 cm.  Lymph nodes: No abnormal appearing lymph nodes. The previously demonstrated prominent left axillary lymph nodes are no longer prominent. Ancillary findings:  None.  IMPRESSION: 1. Partial imaging response of the known malignancy in the left breast, as described above, currently spanning 10.1 cm. 2. Complete imaging response of previously demonstrated left axillary adenopathy. 3. No evidence of malignancy on the right.   PATHOLOGY DIAGNOSIS:  A. BREAST, LEFT, 4 O'CLOCK 7 CM FROM NIPPLE; ULTRASOUND-GUIDED CORE BIOPSY:  - INVASIVE MAMMARY CARCINOMA, NO SPECIAL TYPE, GRADE 2, SEE COMMENT.  - INVASIVE CARCINOMA MEASURES 15 MM IN THIS SAMPLE.  B. BREAST, LEFT, 1 O'CLOCK, 3 CM FROM NIPPLE; ULTRASOUND-GUIDED CORE  BIOPSY:  - INVASIVE MAMMARY CARCINOMA, NO SPECIAL TYPE, GRADE 2, SEE COMMENT.  - INVASIVE CARCINOMA MEASURES 10 MM IN THIS SAMPLE.  C. LYMPH NODE, LEFT AXILLA; ULTRASOUND-GUIDED CORE BIOPSY:  - METASTATIC  CARCINOMA, 7 MM METASTASIS, SEE COMMENT.  BREAST BIOMARKER TEST RESULTS - TUMOR A, 4 O'CLOCK:  Estrogen Receptor (ER) Status: POSITIVE, >90% of cells with nuclear  positivity     Average intensity of staining: Strong  Progesterone Receptor (PR) Status: POSITIVE, >90% of cells with nuclear  positivity     Average intensity of staining: Strong  HER2 (by immunohistochemistry): Equivocal (score 2+)  Percentage of cells with uniform intense complete membrane staining: 1%   BREAST BIOMARKER TEST RESULTS - TUMOR B, 1 O'CLOCK:  Estrogen Receptor (ER) Status: POSITIVE, >90% of cells with nuclear  positivity     Average intensity of staining: Strong  Progesterone Receptor (PR) Status: POSITIVE, 51-90% of cells with  nuclear positivity     Average intensity of staining: Moderate to strong  HER2 (by immunohistochemistry): Equivocal (score 2+)  Percentage of cells with uniform intense complete membrane staining: 2%    ADDENDUM #2   BREAST BIOMARKER TEST: HER2 FISH - TUMOR A, 4 O'CLOCK:  HER2 (ERBB2) (by in situ hybridization): NEGATIVE (not amplified)  Number of observers: 2  Number of invasive tumor cells counted: 40  Dual probe assay  Average number of HER2 signals per cell: 3.6  Average number of CEP17 signals per cell: 2.5  HER2/CEP17 ratio: 1.42   05/14/2017 MRI breast IMPRESSION: 1. Two sites of biopsy proven malignancy seen in the left breast, which span up to 11.3 cm. There are intervening areas of non mass enhancement and a small suspicious enhancing mass. 2. The biopsy-proven metastatic left axillary lymph node is identified. There are 3 other prominent left axillary lymph nodes which are not definitely pathologically enlarged. 3.  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  Cancer Staging Malignant neoplasm of lower-outer quadrant of left breast of female, estrogen receptor positive ([place]) Staging form: Breast, AJCC 8th Edition - Clinical stage from 04/25/2017: Stage IIA (cT3(m), cN1(f), cM0, G2, ER: Positive, PR: Positive, HER2: Equivocal) - Signed by Earlie Server, MD on 05/23/2017  Malignant neoplasm of upper-outer quadrant of left breast in female, estrogen receptor positive ([place]) Staging form: Breast, AJCC 8th Edition - Clinical: No stage assigned - Unsigned  1. Malignant neoplasm of upper-outer quadrant of left breast in female, estrogen receptor positive ([place])   2. Malignant neoplasm of lower-outer quadrant of left breast of female, estrogen receptor positive ([place])   3. Breast cancer metastasized to axillary lymph node, left ([place])   4. Encounter for antineoplastic chemotherapy    1  Multifocal left breast cancer, ER+PR+, HER2-,  s/p 4 cycles of  ddAC with GCSF support. Tolerates well except mucositis which improved with magic mouth wash and a course of Valtrex. MRI showed over all partial response. Her case was presented on Breast tumor board on 07/16/2017 and consensus reached to proceed with 12 doses of weekly Taxol prior to surgery. Discussed with patient.  Labs reviewed. OK to proceed to Cycle 1 weekly Taxol.   2  check CBC, CMP Prior to each treatment cycles.  # RBC microcytosis, iron TIBC ferritin reviewed, no overt iron deficiency # Tongue lesion: much improved after  7 days Valtrex 1000 mg BID. Continue Peridex mouth wash as needed, refilled Rx..   I will see her on 10/17, prior to cycle 2 Taxol to evaluate toxicity.  She will proceed with cycle 3 Taxol on 10/24 with CBC,  CMP prior to chemotherapy.  She will be seen by my colleague Dr.Finnegan on 10/31 prior to cycle 4 Taxol. Assuming she is doing fine, she will proceed with cycle 5 taxol on 11/7 I will see her on 11/14 prior to cycle 6 Taxol.  All questions were  answered. The patient knows to call the clinic with any problems, questions or concerns.    Earlie Server, MD  07/18/2017 1:38 PM

## 2017-07-25 ENCOUNTER — Encounter: Payer: Self-pay | Admitting: Oncology

## 2017-07-25 ENCOUNTER — Inpatient Hospital Stay: Payer: BLUE CROSS/BLUE SHIELD

## 2017-07-25 ENCOUNTER — Inpatient Hospital Stay (HOSPITAL_BASED_OUTPATIENT_CLINIC_OR_DEPARTMENT_OTHER): Payer: BLUE CROSS/BLUE SHIELD | Admitting: Oncology

## 2017-07-25 VITALS — BP 108/68 | HR 98 | Temp 99.0°F | Resp 16 | Wt 186.3 lb

## 2017-07-25 DIAGNOSIS — C50412 Malignant neoplasm of upper-outer quadrant of left female breast: Secondary | ICD-10-CM

## 2017-07-25 DIAGNOSIS — E119 Type 2 diabetes mellitus without complications: Secondary | ICD-10-CM

## 2017-07-25 DIAGNOSIS — Z9989 Dependence on other enabling machines and devices: Secondary | ICD-10-CM

## 2017-07-25 DIAGNOSIS — Z923 Personal history of irradiation: Secondary | ICD-10-CM

## 2017-07-25 DIAGNOSIS — Z79899 Other long term (current) drug therapy: Secondary | ICD-10-CM

## 2017-07-25 DIAGNOSIS — M8580 Other specified disorders of bone density and structure, unspecified site: Secondary | ICD-10-CM

## 2017-07-25 DIAGNOSIS — Z5111 Encounter for antineoplastic chemotherapy: Secondary | ICD-10-CM

## 2017-07-25 DIAGNOSIS — I10 Essential (primary) hypertension: Secondary | ICD-10-CM | POA: Diagnosis not present

## 2017-07-25 DIAGNOSIS — C50912 Malignant neoplasm of unspecified site of left female breast: Secondary | ICD-10-CM

## 2017-07-25 DIAGNOSIS — Z170 Estrogen receptor positive status [ER+]: Secondary | ICD-10-CM | POA: Diagnosis not present

## 2017-07-25 DIAGNOSIS — C773 Secondary and unspecified malignant neoplasm of axilla and upper limb lymph nodes: Secondary | ICD-10-CM

## 2017-07-25 DIAGNOSIS — G4730 Sleep apnea, unspecified: Secondary | ICD-10-CM | POA: Diagnosis not present

## 2017-07-25 DIAGNOSIS — R590 Localized enlarged lymph nodes: Secondary | ICD-10-CM | POA: Diagnosis not present

## 2017-07-25 DIAGNOSIS — Z7984 Long term (current) use of oral hypoglycemic drugs: Secondary | ICD-10-CM | POA: Diagnosis not present

## 2017-07-25 DIAGNOSIS — C50512 Malignant neoplasm of lower-outer quadrant of left female breast: Secondary | ICD-10-CM | POA: Diagnosis not present

## 2017-07-25 DIAGNOSIS — Z8542 Personal history of malignant neoplasm of other parts of uterus: Secondary | ICD-10-CM

## 2017-07-25 LAB — CBC WITH DIFFERENTIAL/PLATELET
BASOS ABS: 0.1 10*3/uL (ref 0–0.1)
BASOS PCT: 1 %
EOS ABS: 0 10*3/uL (ref 0–0.7)
Eosinophils Relative: 0 %
HEMATOCRIT: 24.6 % — AB (ref 35.0–47.0)
HEMOGLOBIN: 8.6 g/dL — AB (ref 12.0–16.0)
LYMPHS ABS: 0.8 10*3/uL — AB (ref 1.0–3.6)
Lymphocytes Relative: 8 %
MCH: 30.4 pg (ref 26.0–34.0)
MCHC: 35.1 g/dL (ref 32.0–36.0)
MCV: 86.5 fL (ref 80.0–100.0)
Monocytes Absolute: 1.5 10*3/uL — ABNORMAL HIGH (ref 0.2–0.9)
Monocytes Relative: 14 %
Neutro Abs: 8.2 10*3/uL — ABNORMAL HIGH (ref 1.4–6.5)
Neutrophils Relative %: 77 %
Platelets: 436 10*3/uL (ref 150–440)
RBC: 2.84 MIL/uL — ABNORMAL LOW (ref 3.80–5.20)
RDW: 21.6 % — AB (ref 11.5–14.5)
WBC: 10.6 10*3/uL (ref 3.6–11.0)

## 2017-07-25 LAB — COMPREHENSIVE METABOLIC PANEL
ALBUMIN: 3.8 g/dL (ref 3.5–5.0)
ALK PHOS: 70 U/L (ref 38–126)
ALT: 17 U/L (ref 14–54)
AST: 25 U/L (ref 15–41)
Anion gap: 7 (ref 5–15)
BILIRUBIN TOTAL: 0.4 mg/dL (ref 0.3–1.2)
BUN: 11 mg/dL (ref 6–20)
CALCIUM: 9.6 mg/dL (ref 8.9–10.3)
CO2: 26 mmol/L (ref 22–32)
Chloride: 101 mmol/L (ref 101–111)
Creatinine, Ser: 0.68 mg/dL (ref 0.44–1.00)
GFR calc Af Amer: >60 mL/min (ref >60)
GFR calc non Af Amer: >60 mL/min (ref >60)
GLUCOSE: 152 mg/dL — AB (ref 65–99)
Potassium: 4.1 mmol/L (ref 3.5–5.1)
SODIUM: 134 mmol/L — AB (ref 135–145)
TOTAL PROTEIN: 6.6 g/dL (ref 6.5–8.1)

## 2017-07-25 MED ORDER — FAMOTIDINE IN NACL 20-0.9 MG/50ML-% IV SOLN
20.0000 mg | Freq: Once | INTRAVENOUS | Status: AC
  Administered 2017-07-25: 20 mg via INTRAVENOUS
  Filled 2017-07-25: qty 50

## 2017-07-25 MED ORDER — SODIUM CHLORIDE 0.9 % IV SOLN
20.0000 mg | Freq: Once | INTRAVENOUS | Status: AC
  Administered 2017-07-25: 20 mg via INTRAVENOUS
  Filled 2017-07-25: qty 2

## 2017-07-25 MED ORDER — SODIUM CHLORIDE 0.9% FLUSH
10.0000 mL | INTRAVENOUS | Status: DC | PRN
  Administered 2017-07-25: 10 mL via INTRAVENOUS
  Filled 2017-07-25: qty 10

## 2017-07-25 MED ORDER — HEPARIN SOD (PORK) LOCK FLUSH 100 UNIT/ML IV SOLN
500.0000 [IU] | Freq: Once | INTRAVENOUS | Status: AC
  Administered 2017-07-25: 500 [IU] via INTRAVENOUS
  Filled 2017-07-25: qty 5

## 2017-07-25 MED ORDER — DIPHENHYDRAMINE HCL 50 MG/ML IJ SOLN
25.0000 mg | Freq: Once | INTRAMUSCULAR | Status: AC
  Administered 2017-07-25: 25 mg via INTRAVENOUS
  Filled 2017-07-25: qty 1

## 2017-07-25 MED ORDER — SODIUM CHLORIDE 0.9 % IV SOLN
Freq: Once | INTRAVENOUS | Status: AC
  Administered 2017-07-25: 10:00:00 via INTRAVENOUS
  Filled 2017-07-25: qty 1000

## 2017-07-25 MED ORDER — PACLITAXEL CHEMO INJECTION 300 MG/50ML
80.0000 mg/m2 | Freq: Once | INTRAVENOUS | Status: AC
  Administered 2017-07-25: 150 mg via INTRAVENOUS
  Filled 2017-07-25: qty 25

## 2017-07-25 NOTE — Progress Notes (Signed)
[facility] Follow Up Visit.  Patient Care Team: Glendon Axe, MD as PCP - General (Internal Medicine)  CHIEF COMPLAINTS/PURPOSE OF CONSULTATION: History of Uterine Cancer, treated with radiation.   Pertinent Oncology History Dominique Guzman 66 y.o. female with PMH listed as below is referred by Dr.Schermerhorn to [name] for evaluation and management of newly diagnosed breast cancer. Patient had a history a left breast mass biopsy followed by breast excisonal biopsy in 2015 with benign pathology.  She also has a history of Uterine cancer which was treated with RT with Dr.Chrystal.  She underwent routine screening mammogram on 04/17/2017 which recommended a possible mass on the left breast. Left diagnostic mammogram showed a highly suspicious left breast mass at 4 o'clock, 7 cm from the nipple and 1 o'clock,,  About 3 cm from the nipple. Enlarged left axillary lymph node suspicious for metastatic disease.  1 US breast confirmed a left breast mass of 2.1cm, 4 o'clock, and 1.1 cm mass at 1 o'clock.The masses at 1 and 4 o'clock are separated by approximately 4 cm sonographically 2 Biopsy of the two mass and axillary lymph node revealed invasive carcinoma, grade 2. Both are ER,PR positive. HER2 equivocal, FISH pending. 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  3 05/14/2017 MRI breast showed Two sites of biopsy proven malignancy seen in the left breast, which span up to 11.3 cm. There are intervening areas of non mass enhancement and a small suspicious enhancing mass. 4 Case was discussed on breast tumor board on 05/21/2017 and the panel agree with neoadjuvant ddAC to T followed by surgery.  5 s/p 4 cycles ddAC, Tolerates well except mucositis which improved with magic mouth wash and a course of Valtrex.  currently on weekly Taxol.     INTERVAL HISTORY She comes for evaluation before Cycle 2 weekly Taxol. S/p 4 cycles of ddAC..  she is accompanied by her husband.  Tolerated cycle 1 Taxol well. Denies any nausea, vomiting, skin rash.  Denies headache, SOB, chest pain, abdominal pain. Denies any neurological complaint. She feels the benadryl premedication was too strong and knock her and asked if benadryl dose can be cut down.   Review of Systems - Oncology . Constitutional: Negative for fever, night sweats,unintentional weight loss, change in appetite. HENT: Negative for ear pain, hearing loss, nasal bleeding Eyes: Negative for eye pain, double vision   Respiratory: Negative for wheezing, shortness of breath, cough Cardiovascular: Negative for chest pain, palpitation.   Gastrointestinal: Negative abdominal pain, diarrhea, nausea vomiting Endocrine: Negative  Genitourinary: Negative for dysuria, hematuria, frequency Skin: Negative for rash, iching, bruising Neurological: Negative for headache, dizziness, seizure Hematological: Negative for easy bruising/bleeding, lymph node enlargement Psychiatric/Behavioral: Negative for depression, anxiety, suicidality MEDICAL HISTORY: Past Medical History:  Diagnosis Date  . Anemia   . Arthritis    HIP-LEFT  . Benign neoplasm of colon   . Cancer ([place])   . Diabetes mellitus without complication ([place])   . H/O osteopenia   . Hypertension   . Sleep apnea    USE CPAP  . Uterine cancer [facility])     SURGICAL HISTORY: Past Surgical History:  Procedure Laterality Date  . ABDOMINAL HYSTERECTOMY    . BREAST BIOPSY Left 04/2014   negative  . BREAST EXCISIONAL BIOPSY Left 2015  . CESAREAN SECTION     X2  . COLONOSCOPY WITH PROPOFOL N/A 04/15/2015   Procedure: COLONOSCOPY WITH PROPOFOL;  Surgeon: Hulen Luster, MD;  Location: [facility] ENDOSCOPY;  Service:  Gastroenterology;  Laterality: N/A;  . HERNIA REPAIR    . PORTACATH PLACEMENT Right 05/22/2017   Procedure: INSERTION PORT-A-CATH;  Surgeon: Leonie Green, MD;  Location: [facility] ORS;  Service: General;  Laterality: Right;    SOCIAL HISTORY: Social History   Social  History  . Marital status: Married    Spouse name: N/A  . Number of children: N/A  . Years of education: N/A   Occupational History  . Not on file.   Social History Main Topics  . Smoking status: Never Smoker  . Smokeless tobacco: Never Used  . Alcohol use No  . Drug use: No  . Sexual activity: Not on file   Other Topics Concern  . Not on file   Social History Narrative  . No narrative on file    FAMILY HISTORY Family History  Problem Relation Age of Onset  . Diabetes Mother   . Diabetes Father   . Diabetes Sister   . Breast cancer Neg Hx     ALLERGIES:  has No Known Allergies.  MEDICATIONS:  Current Outpatient Prescriptions  Medication Sig Dispense Refill  . Calcium Carb-Cholecalciferol (CALCIUM 600-D PO) Take 1 tablet by mouth daily.    . chlorhexidine (PERIDEX) 0.12 % solution Use as directed 15 mLs in the mouth or throat 2 (two) times daily. 473 mL 0  . CINNAMON PO Take 2 capsules by mouth daily.    [name] FIBER PO Take 1 capsule by mouth daily.    . fluticasone (FLONASE) 50 MCG/ACT nasal spray Place 2 sprays into both nostrils as needed.     . Glucosamine HCl (GLUCOSAMINE PO) Take 1 tablet by mouth daily.    [name] lidocaine-prilocaine (EMLA) cream Apply 1 application topically as needed. Apply small amount to port site at least 1 hour prior to it being accessed, cover with plastic wrap 30 g 3  . lisinopril-hydrochlorothiazide (PRINZIDE,ZESTORETIC) 20-12.5 MG per tablet Take 1 tablet by mouth daily.    [name] loperamide (IMODIUM) 2 MG capsule Take 1 capsule (2 mg total) by mouth See admin instructions. With onset of loose stool, take 17m followed by 252mevery 2 hours until 12 hours have passed without loose bowel movement. Maximum: 16 mg/day 120 capsule 1  . magic mouthwash SOLN SWISH AND SPIT 5 TO 10 MLS 4 TIMES A DAY AS NEEDED  3  . metFORMIN (GLUCOPHAGE) 500 MG tablet Take 1,000 mg by mouth 2 (two) times daily.     . Multiple Vitamin (MULTIVITAMIN) tablet Take 1 tablet by  mouth daily.    . ondansetron (ZOFRAN) 8 MG tablet Take 1 tablet (8 mg total) by mouth every 8 (eight) hours as needed for nausea or vomiting. 120 tablet 0  . sitaGLIPtin (JANUVIA) 100 MG tablet Take 100 mg by mouth daily.    . valACYclovir (VALTREX) 1000 MG tablet Take 1 tablet (1,000 mg total) by mouth 2 (two) times daily. 14 tablet 0   No current facility-administered medications for this visit.    Facility-Administered Medications Ordered in Other Visits  Medication Dose Route Frequency Provider Last Rate Last Dose  . heparin lock flush 100 unit/mL  500 Units Intravenous Once YuEarlie ServerMD      . sodium chloride flush (NS) 0.9 % injection 10 mL  10 mL Intravenous PRN YuEarlie ServerMD   10 mL at 07/25/17 0812    PHYSICAL EXAMINATION:  ECOG PERFORMANCE STATUS: 0 - Asymptomatic  Vitals:   07/25/17 0855  BP: 108/68  Pulse: 98  Resp: 16  Temp: 99 F (37.2 C)    Filed Weights   07/25/17 0855  Weight: 186 lb 5 oz (84.5 kg)     Physical Exam  GENERAL: No distress, well nourished.  SKIN:  No rashes or significant lesions  HEAD: Normocephalic, No masses, lesions, tenderness or abnormalities  EYES: Conjunctiva are pink, non icteric ENT: External ears normal ,lips , buccal mucosa, and tongue normal and mucous membranes are moist  LYMPH: No palpable cervical and axillary lymphadenopathy  LUNGS: Clear to auscultation, no crackles or wheezes HEART: Regular rate & rhythm, no murmurs, no gallops, S1 normal and S2 normal  ABDOMEN: Abdomen soft, non-tender, normal bowel sounds, I did not appreciate any  masses or organomegaly  MUSCULOSKELETAL: No CVA tenderness and no tenderness on percussion of the back or rib cage.  EXTREMITIES: No edema, no skin discoloration or tenderness NEURO: Alert & oriented, no focal motor/sensory deficits. Breast exam was performed in lying down position. No evidence of any palpable masses or lumps in the right breast. No evidence of right axillary adenopathy. Left  breast axillary lymph node not palpable.  I can barely feel her left breast outer quadrant mass.  LABORATORY DATA: No recent labs.  CBC    Component Value Date/Time   WBC 18.7 (H) 07/18/2017 0835   RBC 2.91 (L) 07/18/2017 0835   HGB 8.5 (L) 07/18/2017 0835   HCT 24.1 (L) 07/18/2017 0835   PLT 194 07/18/2017 0835   MCV 82.9 07/18/2017 0835   MCH 29.2 07/18/2017 0835   MCHC 35.2 07/18/2017 0835   RDW 16.2 (H) 07/18/2017 0835   LYMPHSABS 0.8 (L) 07/18/2017 0835   MONOABS 2.0 (H) 07/18/2017 0835   EOSABS 0.0 07/18/2017 0835   BASOSABS 0.1 07/18/2017 0835   CMP Latest Ref Rng & Units 07/18/2017 07/04/2017 06/20/2017  Glucose 65 - 99 mg/dL 141(H) 158(H) 184(H)  BUN 6 - 20 mg/dL _0 Creatinine 0.44 - 1.00 mg/dL 0.58 0.75 0.71  Sodium 135 - 145 mmol/L 136 134(L) 134(L)  Potassium 3.5 - 5.1 mmol/L 3.7 4.2 3.8  Chloride 101 - 111 mmol/L 101 99(L) 101  CO2 22 - 32 mmol/L _1 Calcium 8.9 - 10.3 mg/dL 9.4 9.5 9.1  Total Protein 6.5 - 8.1 g/dL 6.4(L) 6.9 6.4(L)  Total Bilirubin 0.3 - 1.2 mg/dL 0.4 0.4 0.3  Alkaline Phos 38 - 126 U/L 89 84 84  AST 15 - 41 U/L _2 ALT 14 - 54 U/L 11(L) 13(L) 16   RADIOGRAPHIC STUDIES: I have personally reviewed the radiological images as listed and agree with the findings in the report 04/25/2017 Mammogram Diagnostic Targeted ultrasound of the left breast demonstrates an irregular, hypoechoic mass at 1 o'clock, 3 cm from the nipple measuring 10 x 10 x 10 mm corresponding to the mass seen within the anterior upper, outer left breast. At 4 o'clock, 7 cm from the nipple, there is an irregular hypoechoic mass measuring 2.1 x 1.5 x 1.9 cm. Targeted ultrasound of the left axilla demonstrates an enlarged, abnormal axillary lymph node demonstrating cortical thickening up to 6 mm. The masses at 1 and 4 o'clock are separated by approximately 4 cm sonographically. IMPRESSION: Highly suspicious left breast masses at 4 o'clock, 7 cm from the nipple  and 1 o'clock, 3 cm from the nipple. Enlarged left axillary lymph node suspicious for metastatic disease. US breast 04/25/2017 FINDINGS: There is an irregular spiculated mass in the slightly lower, outer left breast, posterior  depth measuring approximately 2.6 x 2.5 x 1.9 cm. There is an additional irregular mass within the upper, outerleft breast, anterior depth measuring approximately 1 cm in size, which is suspicious for a satellite lesion. There is asymmetric density between the 2 masses suggesting contiguous spread of disease. Mammographically, the masses are separated by approximately 6 cm and in total span approximately 9.6 cm.  Mammographic images were processed with CAD.  Targeted ultrasound of the left breast demonstrates an irregular, hypoechoic mass at 1 o'clock, 3 cm from the nipple measuring 10 x 10 x 10 mm corresponding to the mass seen within the anterior upper, outer left breast. At 4 o'clock, 7 cm from the nipple, there is an irregular hypoechoic mass measuring 2.1 x 1.5 x 1.9 cm. Targeted ultrasound of the left axilla demonstrates an enlarged, abnormal axillary lymph node demonstrating cortical thickening up to 6 mm. The masses at 1 and 4 o'clock are separated by approximately 4 cm sonographically. IMPRESSION: Highly suspicious left breast masses at 4 o'clock, 7 cm from the nipple and 1 o'clock, 3 cm from the nipple. Enlarged left axillary lymph node suspicious for metastatic disease.  07/11/2017 MRI breast Bilateral FINDINGS: Breast composition: b. Scattered fibroglandular tissue. Background parenchymal enhancement: Minimal.  Right breast: No significant change in several small nodules possibly representing normal intramammary lymph nodes, mammographically stable.  Left breast: The previously demonstrated 2.6 x 2.2 x 2.0 cm enhancing mass in the posterior aspect of the upper-outer quadrant of the left breast demonstrates significantly less enhancement. This is  currently very low-grade with persistent enhancement kinetics. This currently measures 2.6 x 1.6 x 1.6 cm on image number 131 of series 10603 and in the sagittal plane.  The previously demonstrated 1.7 x 1.3 x 0.9 cm anterior enhancing mass containing a biopsy marker clip artifact currently measures 1.4 x 1.0 x 0.6 cm on image number 132 of series 10603 and in the sagittal plane. This also demonstrates some decrease in enhancement with a mixture of enhancement kinetics, including rapid wash-in/ washout.  There are are 2 additional linear areas of non mass enhancement between these areas, currently measuring 3.9 cm in length on axial image number 121 and 3.0 cm in length on axial image number 113 of the same series.  The 1.4 x 0.7 x 0.5 cm third enhancing mass enhancement between the 2 biopsied areas previously is significantly smaller, currently measuring 0.7 x 0.3 x 0.2 cm on image number 99 of the same series and in the sagittal plane.  The residual enhancing areas in the left breast span an area measuring 10.1 cm.  Lymph nodes: No abnormal appearing lymph nodes. The previously demonstrated prominent left axillary lymph nodes are no longer prominent. Ancillary findings:  None.  IMPRESSION: 1. Partial imaging response of the known malignancy in the left breast, as described above, currently spanning 10.1 cm. 2. Complete imaging response of previously demonstrated left axillary adenopathy. 3. No evidence of malignancy on the right.   PATHOLOGY DIAGNOSIS:  A. BREAST, LEFT, 4 O'CLOCK 7 CM FROM NIPPLE; ULTRASOUND-GUIDED CORE BIOPSY:  - INVASIVE MAMMARY CARCINOMA, NO SPECIAL TYPE, GRADE 2, SEE COMMENT.  - INVASIVE CARCINOMA MEASURES 15 MM IN THIS SAMPLE.  B. BREAST, LEFT, 1 O'CLOCK, 3 CM FROM NIPPLE; ULTRASOUND-GUIDED CORE  BIOPSY:  - INVASIVE MAMMARY CARCINOMA, NO SPECIAL TYPE, GRADE 2, SEE COMMENT.  - INVASIVE CARCINOMA MEASURES 10 MM IN THIS SAMPLE.  C. LYMPH NODE, LEFT  AXILLA; ULTRASOUND-GUIDED CORE BIOPSY:  - METASTATIC CARCINOMA, 7 MM METASTASIS, SEE  COMMENT.    BREAST BIOMARKER TEST RESULTS - TUMOR A, 4 O'CLOCK:  Estrogen Receptor (ER) Status: POSITIVE, >90% of cells with nuclear  positivity     Average intensity of staining: Strong  Progesterone Receptor (PR) Status: POSITIVE, >90% of cells with nuclear  positivity     Average intensity of staining: Strong  HER2 (by immunohistochemistry): Equivocal (score 2+)  Percentage of cells with uniform intense complete membrane staining: 1%   BREAST BIOMARKER TEST RESULTS - TUMOR B, 1 O'CLOCK:  Estrogen Receptor (ER) Status: POSITIVE, >90% of cells with nuclear  positivity     Average intensity of staining: Strong  Progesterone Receptor (PR) Status: POSITIVE, 51-90% of cells with  nuclear positivity     Average intensity of staining: Moderate to strong  HER2 (by immunohistochemistry): Equivocal (score 2+)  Percentage of cells with uniform intense complete membrane staining: 2%    ADDENDUM #2   BREAST BIOMARKER TEST: HER2 FISH - TUMOR A, 4 O'CLOCK:  HER2 (ERBB2) (by in situ hybridization): NEGATIVE (not amplified)  Number of observers: 2  Number of invasive tumor cells counted: 40  Dual probe assay  Average number of HER2 signals per cell: 3.6  Average number of CEP17 signals per cell: 2.5  HER2/CEP17 ratio: 1.42   05/14/2017 MRI breast IMPRESSION: 1. Two sites of biopsy proven malignancy seen in the left breast, which span up to 11.3 cm. There are intervening areas of non mass enhancement and a small suspicious enhancing mass. 2. The biopsy-proven metastatic left axillary lymph node is identified. There are 3 other prominent left axillary lymph nodes which are not definitely pathologically enlarged. 3.  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  Cancer Staging Malignant neoplasm of lower-outer quadrant of left breast of female, estrogen receptor positive ([place]) Staging form: Breast, AJCC 8th Edition - Clinical stage from 04/25/2017: Stage IIA (cT3(m), cN1(f), cM0, G2, ER: Positive, PR: Positive, HER2: Equivocal) - Signed by Earlie Server, MD on 05/23/2017  Malignant neoplasm of upper-outer quadrant of left breast in female, estrogen receptor positive ([place]) Staging form: Breast, AJCC 8th Edition - Clinical: No stage assigned - Unsigned  1. Encounter for antineoplastic chemotherapy   2. Malignant neoplasm of upper-outer quadrant of left breast in female, estrogen receptor positive ([place])   3. Malignant neoplasm of lower-outer quadrant of left breast of female, estrogen receptor positive ([place])   4. Breast cancer metastasized to axillary lymph node, left ([place])   5. Carcinoma of left breast, estrogen and progesterone receptor positive ([place])    1  Multifocal left breast cancer, ER+PR+, HER2-,  s/p 4 cycles of  ddAC with GCSF support.  MRI showed over all partial response. Her case was presented on Breast tumor board on 07/16/2017 and consensus reached to proceed with 12 doses of weekly Taxol prior to surgery.  Labs reviewed. OK to proceed to Cycle 2 weekly Taxol.  Decrease benadryl premedication to 81m.   2  check CBC, CMP Prior to each treatment cycles.   She will proceed with cycle 3 Taxol on 10/24 with CBC, CMP prior to chemotherapy.  She will be seen by my colleague Dr.Finnegan on 10/31 prior to cycle 4 Taxol. Assuming she is doing fine, she will proceed with cycle 5 taxol on 11/7 I will see her on 11/14 prior to  cycle 6 Taxol.  All questions were answered. The patient knows to call the clinic with any problems, questions or concerns.    Earlie Server, MD  07/25/2017 8:38 AM

## 2017-07-25 NOTE — Progress Notes (Signed)
Patient here today for follow up and chemotherapy.  

## 2017-08-01 ENCOUNTER — Inpatient Hospital Stay: Payer: BLUE CROSS/BLUE SHIELD

## 2017-08-01 VITALS — BP 101/85 | HR 87 | Temp 98.2°F | Resp 18

## 2017-08-01 DIAGNOSIS — C50412 Malignant neoplasm of upper-outer quadrant of left female breast: Secondary | ICD-10-CM | POA: Diagnosis not present

## 2017-08-01 DIAGNOSIS — C50512 Malignant neoplasm of lower-outer quadrant of left female breast: Secondary | ICD-10-CM

## 2017-08-01 DIAGNOSIS — Z170 Estrogen receptor positive status [ER+]: Principal | ICD-10-CM

## 2017-08-01 LAB — COMPREHENSIVE METABOLIC PANEL
ALBUMIN: 3.6 g/dL (ref 3.5–5.0)
ALK PHOS: 69 U/L (ref 38–126)
ALT: 19 U/L (ref 14–54)
AST: 23 U/L (ref 15–41)
Anion gap: 10 (ref 5–15)
BILIRUBIN TOTAL: 0.5 mg/dL (ref 0.3–1.2)
BUN: 11 mg/dL (ref 6–20)
CALCIUM: 9.5 mg/dL (ref 8.9–10.3)
CO2: 24 mmol/L (ref 22–32)
CREATININE: 0.56 mg/dL (ref 0.44–1.00)
Chloride: 100 mmol/L — ABNORMAL LOW (ref 101–111)
GFR calc Af Amer: >60 mL/min (ref >60)
GFR calc non Af Amer: >60 mL/min (ref >60)
GLUCOSE: 136 mg/dL — AB (ref 65–99)
Potassium: 3.9 mmol/L (ref 3.5–5.1)
Sodium: 134 mmol/L — ABNORMAL LOW (ref 135–145)
TOTAL PROTEIN: 6.3 g/dL — AB (ref 6.5–8.1)

## 2017-08-01 LAB — CBC WITH DIFFERENTIAL/PLATELET
BASOS ABS: 0.1 10*3/uL (ref 0–0.1)
BASOS PCT: 2 %
EOS PCT: 1 %
Eosinophils Absolute: 0 10*3/uL (ref 0–0.7)
HCT: 25 % — ABNORMAL LOW (ref 35.0–47.0)
Hemoglobin: 8.8 g/dL — ABNORMAL LOW (ref 12.0–16.0)
LYMPHS PCT: 11 %
Lymphs Abs: 0.6 10*3/uL — ABNORMAL LOW (ref 1.0–3.6)
MCH: 31.6 pg (ref 26.0–34.0)
MCHC: 35.3 g/dL (ref 32.0–36.0)
MCV: 89.6 fL (ref 80.0–100.0)
MONO ABS: 0.7 10*3/uL (ref 0.2–0.9)
Monocytes Relative: 13 %
Neutro Abs: 3.9 10*3/uL (ref 1.4–6.5)
Neutrophils Relative %: 73 %
PLATELETS: 247 10*3/uL (ref 150–440)
RBC: 2.78 MIL/uL — ABNORMAL LOW (ref 3.80–5.20)
RDW: 26.7 % — AB (ref 11.5–14.5)
WBC: 5.3 10*3/uL (ref 3.6–11.0)

## 2017-08-01 MED ORDER — PACLITAXEL CHEMO INJECTION 300 MG/50ML
80.0000 mg/m2 | Freq: Once | INTRAVENOUS | Status: AC
  Administered 2017-08-01: 150 mg via INTRAVENOUS
  Filled 2017-08-01: qty 25

## 2017-08-01 MED ORDER — FAMOTIDINE IN NACL 20-0.9 MG/50ML-% IV SOLN
20.0000 mg | Freq: Once | INTRAVENOUS | Status: AC
  Administered 2017-08-01: 20 mg via INTRAVENOUS
  Filled 2017-08-01: qty 50

## 2017-08-01 MED ORDER — SODIUM CHLORIDE 0.9 % IV SOLN
20.0000 mg | Freq: Once | INTRAVENOUS | Status: AC
  Administered 2017-08-01: 20 mg via INTRAVENOUS
  Filled 2017-08-01: qty 2

## 2017-08-01 MED ORDER — HEPARIN SOD (PORK) LOCK FLUSH 100 UNIT/ML IV SOLN
500.0000 [IU] | Freq: Once | INTRAVENOUS | Status: AC
  Administered 2017-08-01: 500 [IU] via INTRAVENOUS
  Filled 2017-08-01: qty 5

## 2017-08-01 MED ORDER — HEPARIN SOD (PORK) LOCK FLUSH 100 UNIT/ML IV SOLN: 500.0000 [IU] | Freq: Once | INTRAVENOUS | Status: DC | PRN

## 2017-08-01 MED ORDER — SODIUM CHLORIDE 0.9% FLUSH
10.0000 mL | Freq: Once | INTRAVENOUS | Status: AC
  Administered 2017-08-01: 10 mL via INTRAVENOUS
  Filled 2017-08-01: qty 10

## 2017-08-01 MED ORDER — SODIUM CHLORIDE 0.9 % IV SOLN
Freq: Once | INTRAVENOUS | Status: AC
Start: 2017-08-01 — End: 2017-08-01
  Administered 2017-08-01: 09:00:00 via INTRAVENOUS
  Filled 2017-08-01: qty 1000

## 2017-08-01 MED ORDER — DIPHENHYDRAMINE HCL 50 MG/ML IJ SOLN
25.0000 mg | Freq: Once | INTRAMUSCULAR | Status: AC
  Administered 2017-08-01: 25 mg via INTRAVENOUS
  Filled 2017-08-01: qty 1

## 2017-08-06 NOTE — Progress Notes (Signed)
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  She has increased fatigue, but otherwise is tolerating her treatments well.  She has no peripheral neuropathy or other neurologic complaints.  She denies any recent fevers or illnesses.  She has good appetite and denies weight loss.  She has no chest pain or shortness of breath.  She denies any nausea, vomiting, constipation, or diarrhea.  She has no urinary complaints.  Patient offers no further specific complaints today.  REVIEW OF SYSTEMS:   Review of Systems  Constitutional: Positive for malaise/fatigue. Negative for fever and weight loss.  Respiratory: Negative.  Negative for cough and shortness of breath.   Cardiovascular: Negative.  Negative for chest pain and leg swelling.  Gastrointestinal: Negative.  Negative for abdominal pain.  Genitourinary: Negative.   Musculoskeletal: Negative.   Skin: Negative.  Negative for rash.  Neurological: Negative.  Negative for sensory change and weakness.  Psychiatric/Behavioral: Negative.  The patient is not nervous/anxious.     As per HPI. Otherwise, a complete review of systems is negative.  PAST MEDICAL HISTORY: Past Medical History:  Diagnosis Date  . Anemia   . Arthritis    HIP-LEFT  . Benign neoplasm of colon   . Cancer ([place])   . Diabetes mellitus without complication ([place])   . H/O osteopenia   . Hypertension   . Sleep apnea    USE CPAP  . Uterine cancer ([place])     PAST SURGICAL HISTORY: Past Surgical History:  Procedure Laterality Date  . ABDOMINAL  HYSTERECTOMY    . BREAST BIOPSY Left 04/2014   negative  . BREAST EXCISIONAL BIOPSY Left 2015  . CESAREAN SECTION     X2  . COLONOSCOPY WITH PROPOFOL N/A 04/15/2015   Procedure: COLONOSCOPY WITH PROPOFOL;  Surgeon: Hulen Luster, MD;  Location: [facility] ENDOSCOPY;  Service: Gastroenterology;  Laterality: N/A;  . HERNIA REPAIR    . PORTACATH PLACEMENT Right 05/22/2017   Procedure: INSERTION PORT-A-CATH;  Surgeon: Leonie Green, MD;  Location: [facility] ORS;  Service: General;  Laterality: Right;    FAMILY HISTORY: Family History  Problem Relation Age of Onset  . Diabetes Mother   . Diabetes Father   . Diabetes Sister   . Breast cancer Neg Hx     ADVANCED DIRECTIVES (Y/N):  N  HEALTH MAINTENANCE: Social History  Substance Use Topics  . Smoking status: Never Smoker  . Smokeless tobacco: Never Used  . Alcohol use No     Colonoscopy:  PAP:  Bone density:  Lipid panel:  No Known Allergies  Current Outpatient Prescriptions  Medication Sig Dispense Refill  . Calcium Carb-Cholecalciferol (CALCIUM 600-D PO) Take 1 tablet by mouth daily.    . chlorhexidine (PERIDEX) 0.12 % solution Use as directed 15 mLs in the mouth or throat 2 (two) times daily. 473 mL 0  . CINNAMON PO Take 2 capsules by mouth daily.    . diphenhydrAMINE (BENADRYL) 25 MG tablet Take 25 mg by mouth as needed for allergies.    [name] FIBER PO Take 1 capsule by mouth daily.    . fluticasone (FLONASE) 50 MCG/ACT nasal spray Place  2 sprays into both nostrils as needed.     . Glucosamine HCl (GLUCOSAMINE PO) Take 1 tablet by mouth daily.    [name] lidocaine-prilocaine (EMLA) cream Apply 1 application topically as needed. Apply small amount to port site at least 1 hour prior to it being accessed, cover with plastic wrap 30 g 3  . lisinopril-hydrochlorothiazide (PRINZIDE,ZESTORETIC) 20-12.5 MG per tablet Take 1 tablet by mouth daily.    [name] loperamide (IMODIUM) 2 MG capsule Take 1 capsule (2 mg total) by mouth See admin instructions. With  onset of loose stool, take 81m followed by 252mevery 2 hours until 12 hours have passed without loose bowel movement. Maximum: 16 mg/day 120 capsule 1  . magic mouthwash SOLN SWISH AND SPIT 5 TO 10 MLS 4 TIMES A DAY AS NEEDED  3  . metFORMIN (GLUCOPHAGE) 500 MG tablet Take 1,000 mg by mouth 2 (two) times daily.     . Multiple Vitamin (MULTIVITAMIN) tablet Take 1 tablet by mouth daily.    . ondansetron (ZOFRAN) 8 MG tablet Take 1 tablet (8 mg total) by mouth every 8 (eight) hours as needed for nausea or vomiting. 120 tablet 0  . sitaGLIPtin (JANUVIA) 100 MG tablet Take 100 mg by mouth daily.    . valACYclovir (VALTREX) 1000 MG tablet Take 1 tablet (1,000 mg total) by mouth 2 (two) times daily. 14 tablet 0   No current facility-administered medications for this visit.    Facility-Administered Medications Ordered in Other Visits  Medication Dose Route Frequency Provider Last Rate Last Dose  . heparin lock flush 100 unit/mL  500 Units Intravenous Once YuEarlie ServerMD      . sodium chloride flush (NS) 0.9 % injection 10 mL  10 mL Intravenous PRN YuEarlie ServerMD   10 mL at 08/08/17 0839    OBJECTIVE: Vitals:   08/08/17 0857  BP: 123/75  Pulse: 91  Resp: 16  Temp: 99.5 F (37.5 C)     Body mass index is 39.71 kg/m.    ECOG FS:0 - Asymptomatic  General: Well-developed, well-nourished, no acute distress. Eyes: Pink conjunctiva, anicteric sclera. Breasts: Exam deferred today. Lungs: Clear to auscultation bilaterally. Heart: Regular rate and rhythm. No rubs, murmurs, or gallops. Abdomen: Soft, nontender, nondistended. No organomegaly noted, normoactive bowel sounds. Musculoskeletal: No edema, cyanosis, or clubbing. Neuro: Alert, answering all questions appropriately. Cranial nerves grossly intact. Skin: No rashes or petechiae noted. Psych: Normal affect.  LAB RESULTS:  Lab Results  Component Value Date   NA 134 (L) 08/08/2017   K 4.1 08/08/2017   CL 101 08/08/2017   CO2 26 08/08/2017    GLUCOSE 148 (H) 08/08/2017   BUN 10 08/08/2017   CREATININE 0.52 08/08/2017   CALCIUM 9.6 08/08/2017   PROT 6.5 08/08/2017   ALBUMIN 3.7 08/08/2017   AST 25 08/08/2017   ALT 19 08/08/2017   ALKPHOS 73 08/08/2017   BILITOT 0.5 08/08/2017   GFRNONAA >60 08/08/2017   GFRAA >60 08/08/2017    Lab Results  Component Value Date   WBC 6.9 08/08/2017   NEUTROABS 5.2 08/08/2017   HGB 9.3 (L) 08/08/2017   HCT 26.6 (L) 08/08/2017   MCV 93.1 08/08/2017   PLT 273 08/08/2017     STUDIES: Mr Breast Bilateral W Wo Contrast  Result Date: 07/11/2017 CLINICAL DATA:  Status post neoadjuvant chemotherapy for multifocal left breast invasive mammary carcinoma and metastatic left. Axillary lymph node. LABS:  None obtained today. EXAM: BILATERAL BREAST MRI WITH AND WITHOUT CONTRAST  TECHNIQUE: Multiplanar, multisequence MR images of both breasts were obtained prior to and following the intravenous administration of 17 ml of MultiHance. THREE-DIMENSIONAL MR IMAGE RENDERING ON INDEPENDENT WORKSTATION: Three-dimensional MR images were rendered by post-processing of the original MR data on an independent workstation. The three-dimensional MR images were interpreted, and findings are reported in the following complete MRI report for this study. Three dimensional images were evaluated at the independent DynaCad workstation COMPARISON:  Previous mammogram, ultrasound and biopsy examinations and previous MR dated 05/14/2017. FINDINGS: Breast composition: b. Scattered fibroglandular tissue. Background parenchymal enhancement: Minimal. Right breast: No significant change in several small nodules possibly representing normal intramammary lymph nodes, mammographically stable. Left breast: The previously demonstrated 2.6 x 2.2 x 2.0 cm enhancing mass in the posterior aspect of the upper-outer quadrant of the left breast demonstrates significantly less enhancement. This is currently very low-grade with persistent enhancement  kinetics. This currently measures 2.6 x 1.6 x 1.6 cm on image number 131 of series 10603 and in the sagittal plane. The previously demonstrated 1.7 x 1.3 x 0.9 cm anterior enhancing mass containing a biopsy marker clip artifact currently measures 1.4 x 1.0 x 0.6 cm on image number 132 of series 10603 and in the sagittal plane. This also demonstrates some decrease in enhancement with a mixture of enhancement kinetics, including rapid wash-in/ washout. There are are 2 additional linear areas of non mass enhancement between these areas, currently measuring 3.9 cm in length on axial image number 121 and 3.0 cm in length on axial image number 113 of the same series. The 1.4 x 0.7 x 0.5 cm third enhancing mass enhancement between the 2 biopsied areas previously is significantly smaller, currently measuring 0.7 x 0.3 x 0.2 cm on image number 99 of the same series and in the sagittal plane. The residual enhancing areas in the left breast span an area measuring 10.1 cm. Lymph nodes: No abnormal appearing lymph nodes. The previously demonstrated prominent left axillary lymph nodes are no longer prominent. Ancillary findings:  None. IMPRESSION: 1. Partial imaging response of the known malignancy in the left breast, as described above, currently spanning 10.1 cm. 2. Complete imaging response of previously demonstrated left axillary adenopathy. 3. No evidence of malignancy on the right. RECOMMENDATION: Treatment plan. BI-RADS CATEGORY  6: Known biopsy-proven malignancy. Electronically Signed   By: Claudie Revering M.D.   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  PLAN:    1  Multifocal left breast cancer, ER+PR+, HER2-,  s/p 4 cycles of  ddAC with GCSF support.  MRI showed over all partial response. Her case was presented on  Breast tumor board on 07/16/2017 and consensus reached to proceed with 12 doses of weekly Taxol prior to surgery.  Proceed with cycle 4 of weekly Taxol.  Return to clinic in 1 week for Taxol only and then in 2 weeks for further evaluation and consideration of cycle 6 of 12.  Approximately 30 minutes was spent in discussion of which greater than 50% was consultation.  Patient expressed understanding and was in agreement with this plan. She also understands that She can call clinic at any time with any questions, concerns, or complaints.   Cancer Staging Malignant neoplasm of lower-outer quadrant of left breast of female, estrogen receptor positive ([place]) Staging form: Breast, AJCC 8th Edition -  Clinical stage from 04/25/2017: Stage IIA (cT3(m), cN1(f), cM0, G2, ER: Positive, PR: Positive, HER2: Equivocal) - Signed by Earlie Server, MD on 05/23/2017  Malignant neoplasm of upper-outer quadrant of left breast in female, estrogen receptor positive ([place]) Staging form: Breast, AJCC 8th Edition - Clinical: No stage assigned - Unsigned   Lloyd Huger, MD   08/08/2017 9:27 AM

## 2017-08-07 ENCOUNTER — Other Ambulatory Visit: Payer: Self-pay | Admitting: Oncology

## 2017-08-08 ENCOUNTER — Inpatient Hospital Stay: Payer: BLUE CROSS/BLUE SHIELD

## 2017-08-08 ENCOUNTER — Inpatient Hospital Stay (HOSPITAL_BASED_OUTPATIENT_CLINIC_OR_DEPARTMENT_OTHER): Payer: BLUE CROSS/BLUE SHIELD | Admitting: Oncology

## 2017-08-08 VITALS — BP 123/75 | HR 91 | Temp 99.5°F | Resp 16 | Wt 190.0 lb

## 2017-08-08 DIAGNOSIS — R5383 Other fatigue: Secondary | ICD-10-CM

## 2017-08-08 DIAGNOSIS — R590 Localized enlarged lymph nodes: Secondary | ICD-10-CM

## 2017-08-08 DIAGNOSIS — R5381 Other malaise: Secondary | ICD-10-CM

## 2017-08-08 DIAGNOSIS — C773 Secondary and unspecified malignant neoplasm of axilla and upper limb lymph nodes: Secondary | ICD-10-CM | POA: Diagnosis not present

## 2017-08-08 DIAGNOSIS — Z8542 Personal history of malignant neoplasm of other parts of uterus: Secondary | ICD-10-CM

## 2017-08-08 DIAGNOSIS — M8580 Other specified disorders of bone density and structure, unspecified site: Secondary | ICD-10-CM | POA: Diagnosis not present

## 2017-08-08 DIAGNOSIS — Z170 Estrogen receptor positive status [ER+]: Secondary | ICD-10-CM | POA: Diagnosis not present

## 2017-08-08 DIAGNOSIS — C50512 Malignant neoplasm of lower-outer quadrant of left female breast: Secondary | ICD-10-CM | POA: Diagnosis not present

## 2017-08-08 DIAGNOSIS — M1990 Unspecified osteoarthritis, unspecified site: Secondary | ICD-10-CM

## 2017-08-08 DIAGNOSIS — C50412 Malignant neoplasm of upper-outer quadrant of left female breast: Secondary | ICD-10-CM

## 2017-08-08 DIAGNOSIS — Z79899 Other long term (current) drug therapy: Secondary | ICD-10-CM

## 2017-08-08 DIAGNOSIS — I10 Essential (primary) hypertension: Secondary | ICD-10-CM | POA: Diagnosis not present

## 2017-08-08 DIAGNOSIS — Z9989 Dependence on other enabling machines and devices: Secondary | ICD-10-CM

## 2017-08-08 DIAGNOSIS — G4730 Sleep apnea, unspecified: Secondary | ICD-10-CM

## 2017-08-08 DIAGNOSIS — Z7984 Long term (current) use of oral hypoglycemic drugs: Secondary | ICD-10-CM

## 2017-08-08 DIAGNOSIS — E119 Type 2 diabetes mellitus without complications: Secondary | ICD-10-CM | POA: Diagnosis not present

## 2017-08-08 DIAGNOSIS — Z923 Personal history of irradiation: Secondary | ICD-10-CM

## 2017-08-08 LAB — CBC WITH DIFFERENTIAL/PLATELET
BASOS PCT: 1 %
Basophils Absolute: 0.1 10*3/uL (ref 0–0.1)
EOS ABS: 0.2 10*3/uL (ref 0–0.7)
EOS PCT: 3 %
HEMATOCRIT: 26.6 % — AB (ref 35.0–47.0)
Hemoglobin: 9.3 g/dL — ABNORMAL LOW (ref 12.0–16.0)
LYMPHS ABS: 0.6 10*3/uL — AB (ref 1.0–3.6)
Lymphocytes Relative: 9 %
MCH: 32.7 pg (ref 26.0–34.0)
MCHC: 35.1 g/dL (ref 32.0–36.0)
MCV: 93.1 fL (ref 80.0–100.0)
Monocytes Absolute: 0.9 10*3/uL (ref 0.2–0.9)
Monocytes Relative: 12 %
Neutro Abs: 5.2 10*3/uL (ref 1.4–6.5)
Neutrophils Relative %: 75 %
PLATELETS: 273 10*3/uL (ref 150–440)
RBC: 2.86 MIL/uL — ABNORMAL LOW (ref 3.80–5.20)
RDW: 26.2 % — AB (ref 11.5–14.5)
WBC: 6.9 10*3/uL (ref 3.6–11.0)

## 2017-08-08 LAB — COMPREHENSIVE METABOLIC PANEL
ALK PHOS: 73 U/L (ref 38–126)
ALT: 19 U/L (ref 14–54)
AST: 25 U/L (ref 15–41)
Albumin: 3.7 g/dL (ref 3.5–5.0)
Anion gap: 7 (ref 5–15)
BILIRUBIN TOTAL: 0.5 mg/dL (ref 0.3–1.2)
BUN: 10 mg/dL (ref 6–20)
CALCIUM: 9.6 mg/dL (ref 8.9–10.3)
CHLORIDE: 101 mmol/L (ref 101–111)
CO2: 26 mmol/L (ref 22–32)
CREATININE: 0.52 mg/dL (ref 0.44–1.00)
GFR calc non Af Amer: >60 mL/min (ref >60)
Glucose, Bld: 148 mg/dL — ABNORMAL HIGH (ref 65–99)
Potassium: 4.1 mmol/L (ref 3.5–5.1)
Sodium: 134 mmol/L — ABNORMAL LOW (ref 135–145)
Total Protein: 6.5 g/dL (ref 6.5–8.1)

## 2017-08-08 MED ORDER — SODIUM CHLORIDE 0.9 % IV SOLN
20.0000 mg | Freq: Once | INTRAVENOUS | Status: AC
  Administered 2017-08-08: 20 mg via INTRAVENOUS
  Filled 2017-08-08: qty 2

## 2017-08-08 MED ORDER — PACLITAXEL CHEMO INJECTION 300 MG/50ML
80.0000 mg/m2 | Freq: Once | INTRAVENOUS | Status: AC
  Administered 2017-08-08: 150 mg via INTRAVENOUS
  Filled 2017-08-08: qty 25

## 2017-08-08 MED ORDER — SODIUM CHLORIDE 0.9 % IV SOLN
Freq: Once | INTRAVENOUS | Status: AC
  Administered 2017-08-08: 10:00:00 via INTRAVENOUS
  Filled 2017-08-08: qty 1000

## 2017-08-08 MED ORDER — DIPHENHYDRAMINE HCL 50 MG/ML IJ SOLN
25.0000 mg | Freq: Once | INTRAMUSCULAR | Status: AC
  Administered 2017-08-08: 25 mg via INTRAVENOUS
  Filled 2017-08-08: qty 1

## 2017-08-08 MED ORDER — SODIUM CHLORIDE 0.9% FLUSH
10.0000 mL | INTRAVENOUS | Status: DC | PRN
  Administered 2017-08-08: 10 mL via INTRAVENOUS
  Filled 2017-08-08: qty 10

## 2017-08-08 MED ORDER — FAMOTIDINE IN NACL 20-0.9 MG/50ML-% IV SOLN
20.0000 mg | Freq: Once | INTRAVENOUS | Status: AC
  Administered 2017-08-08: 20 mg via INTRAVENOUS
  Filled 2017-08-08: qty 50

## 2017-08-08 MED ORDER — HEPARIN SOD (PORK) LOCK FLUSH 100 UNIT/ML IV SOLN
500.0000 [IU] | Freq: Once | INTRAVENOUS | Status: AC
  Administered 2017-08-08: 500 [IU] via INTRAVENOUS
  Filled 2017-08-08: qty 5

## 2017-08-08 NOTE — Progress Notes (Signed)
Patient here today for follow up.   

## 2017-08-15 ENCOUNTER — Inpatient Hospital Stay: Payer: BLUE CROSS/BLUE SHIELD

## 2017-08-15 ENCOUNTER — Inpatient Hospital Stay: Payer: BLUE CROSS/BLUE SHIELD | Attending: Oncology

## 2017-08-15 VITALS — BP 118/78 | HR 88 | Temp 98.3°F | Resp 18

## 2017-08-15 DIAGNOSIS — R141 Gas pain: Secondary | ICD-10-CM | POA: Insufficient documentation

## 2017-08-15 DIAGNOSIS — Z7984 Long term (current) use of oral hypoglycemic drugs: Secondary | ICD-10-CM | POA: Diagnosis not present

## 2017-08-15 DIAGNOSIS — C773 Secondary and unspecified malignant neoplasm of axilla and upper limb lymph nodes: Secondary | ICD-10-CM | POA: Diagnosis not present

## 2017-08-15 DIAGNOSIS — M1612 Unilateral primary osteoarthritis, left hip: Secondary | ICD-10-CM | POA: Insufficient documentation

## 2017-08-15 DIAGNOSIS — E860 Dehydration: Secondary | ICD-10-CM | POA: Insufficient documentation

## 2017-08-15 DIAGNOSIS — Z5111 Encounter for antineoplastic chemotherapy: Secondary | ICD-10-CM | POA: Diagnosis not present

## 2017-08-15 DIAGNOSIS — Z79899 Other long term (current) drug therapy: Secondary | ICD-10-CM | POA: Insufficient documentation

## 2017-08-15 DIAGNOSIS — E119 Type 2 diabetes mellitus without complications: Secondary | ICD-10-CM | POA: Insufficient documentation

## 2017-08-15 DIAGNOSIS — R200 Anesthesia of skin: Secondary | ICD-10-CM | POA: Diagnosis not present

## 2017-08-15 DIAGNOSIS — Z23 Encounter for immunization: Secondary | ICD-10-CM | POA: Insufficient documentation

## 2017-08-15 DIAGNOSIS — M8580 Other specified disorders of bone density and structure, unspecified site: Secondary | ICD-10-CM | POA: Insufficient documentation

## 2017-08-15 DIAGNOSIS — C50512 Malignant neoplasm of lower-outer quadrant of left female breast: Secondary | ICD-10-CM

## 2017-08-15 DIAGNOSIS — Z8542 Personal history of malignant neoplasm of other parts of uterus: Secondary | ICD-10-CM | POA: Insufficient documentation

## 2017-08-15 DIAGNOSIS — Z9989 Dependence on other enabling machines and devices: Secondary | ICD-10-CM | POA: Diagnosis not present

## 2017-08-15 DIAGNOSIS — I10 Essential (primary) hypertension: Secondary | ICD-10-CM | POA: Insufficient documentation

## 2017-08-15 DIAGNOSIS — G4730 Sleep apnea, unspecified: Secondary | ICD-10-CM | POA: Diagnosis not present

## 2017-08-15 DIAGNOSIS — Z170 Estrogen receptor positive status [ER+]: Principal | ICD-10-CM

## 2017-08-15 DIAGNOSIS — C50412 Malignant neoplasm of upper-outer quadrant of left female breast: Secondary | ICD-10-CM | POA: Diagnosis not present

## 2017-08-15 DIAGNOSIS — R590 Localized enlarged lymph nodes: Secondary | ICD-10-CM | POA: Diagnosis not present

## 2017-08-15 LAB — CBC WITH DIFFERENTIAL/PLATELET
BASOS ABS: 0.1 10*3/uL (ref 0–0.1)
Basophils Relative: 1 %
EOS ABS: 0.3 10*3/uL (ref 0–0.7)
EOS PCT: 5 %
HCT: 26.5 % — ABNORMAL LOW (ref 35.0–47.0)
Hemoglobin: 9.2 g/dL — ABNORMAL LOW (ref 12.0–16.0)
LYMPHS ABS: 0.5 10*3/uL — AB (ref 1.0–3.6)
LYMPHS PCT: 10 %
MCH: 32.8 pg (ref 26.0–34.0)
MCHC: 34.8 g/dL (ref 32.0–36.0)
MCV: 94.2 fL (ref 80.0–100.0)
MONO ABS: 0.5 10*3/uL (ref 0.2–0.9)
Monocytes Relative: 10 %
Neutro Abs: 4.1 10*3/uL (ref 1.4–6.5)
Neutrophils Relative %: 74 %
PLATELETS: 265 10*3/uL (ref 150–440)
RBC: 2.81 MIL/uL — AB (ref 3.80–5.20)
RDW: 24.1 % — AB (ref 11.5–14.5)
WBC: 5.5 10*3/uL (ref 3.6–11.0)

## 2017-08-15 LAB — COMPREHENSIVE METABOLIC PANEL
ALT: 20 U/L (ref 14–54)
AST: 24 U/L (ref 15–41)
Albumin: 3.7 g/dL (ref 3.5–5.0)
Alkaline Phosphatase: 74 U/L (ref 38–126)
Anion gap: 8 (ref 5–15)
BUN: 10 mg/dL (ref 6–20)
CHLORIDE: 99 mmol/L — AB (ref 101–111)
CO2: 26 mmol/L (ref 22–32)
Calcium: 9.8 mg/dL (ref 8.9–10.3)
Creatinine, Ser: 0.56 mg/dL (ref 0.44–1.00)
Glucose, Bld: 197 mg/dL — ABNORMAL HIGH (ref 65–99)
POTASSIUM: 4 mmol/L (ref 3.5–5.1)
SODIUM: 133 mmol/L — AB (ref 135–145)
Total Bilirubin: 0.5 mg/dL (ref 0.3–1.2)
Total Protein: 6.6 g/dL (ref 6.5–8.1)

## 2017-08-15 MED ORDER — SODIUM CHLORIDE 0.9 % IV SOLN
20.0000 mg | Freq: Once | INTRAVENOUS | Status: AC
  Administered 2017-08-15: 20 mg via INTRAVENOUS
  Filled 2017-08-15: qty 2

## 2017-08-15 MED ORDER — DIPHENHYDRAMINE HCL 50 MG/ML IJ SOLN
25.0000 mg | Freq: Once | INTRAMUSCULAR | Status: AC
  Administered 2017-08-15: 25 mg via INTRAVENOUS
  Filled 2017-08-15: qty 1

## 2017-08-15 MED ORDER — PACLITAXEL CHEMO INJECTION 300 MG/50ML
80.0000 mg/m2 | Freq: Once | INTRAVENOUS | Status: AC
  Administered 2017-08-15: 150 mg via INTRAVENOUS
  Filled 2017-08-15: qty 25

## 2017-08-15 MED ORDER — FAMOTIDINE IN NACL 20-0.9 MG/50ML-% IV SOLN
20.0000 mg | Freq: Once | INTRAVENOUS | Status: AC
  Administered 2017-08-15: 20 mg via INTRAVENOUS
  Filled 2017-08-15: qty 50

## 2017-08-15 MED ORDER — HEPARIN SOD (PORK) LOCK FLUSH 100 UNIT/ML IV SOLN
500.0000 [IU] | Freq: Once | INTRAVENOUS | Status: AC
  Administered 2017-08-15: 500 [IU] via INTRAVENOUS

## 2017-08-15 MED ORDER — SODIUM CHLORIDE 0.9 % IV SOLN
Freq: Once | INTRAVENOUS | Status: AC
  Administered 2017-08-15: 09:00:00 via INTRAVENOUS
  Filled 2017-08-15: qty 1000

## 2017-08-21 NOTE — Progress Notes (Signed)
[facility] Follow Up Visit.  Patient Care Team: Glendon Axe, MD as PCP - General (Internal Medicine)  CHIEF COMPLAINTS/PURPOSE OF CONSULTATION: History of Uterine Cancer, treated with radiation.   Pertinent Oncology History ANALYSSA Guzman 66 y.o. female with PMH listed as below is referred by Dr.Schermerhorn to [name] for evaluation and management of newly diagnosed breast cancer. Patient had a history a left breast mass biopsy followed by breast excisonal biopsy in 2015 with benign pathology.  She also has a history of Uterine cancer which was treated with RT with Dr.Chrystal.  She underwent routine screening mammogram on 04/17/2017 which recommended a possible mass on the left breast. Left diagnostic mammogram showed a highly suspicious left breast mass at 4 o'clock, 7 cm from the nipple and 1 o'clock,,  About 3 cm from the nipple. Enlarged left axillary lymph node suspicious for metastatic disease.  1 US breast confirmed a left breast mass of 2.1cm, 4 o'clock, and 1.1 cm mass at 1 o'clock.The masses at 1 and 4 o'clock are separated by approximately 4 cm sonographically 2 Biopsy of the two mass and axillary lymph node revealed invasive carcinoma, grade 2. Both are ER,PR positive. HER2 equivocal, FISH pending. 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  3 05/14/2017 MRI breast showed Two sites of biopsy proven malignancy seen in the left breast, which span up to 11.3 cm. There are intervening areas of non mass enhancement and a small suspicious enhancing mass. 4 Case was discussed on breast tumor board on 05/21/2017 and the panel agree with neoadjuvant ddAC to T followed by surgery.  5 s/p 4 cycles ddAC, Tolerates well except mucositis which improved with magic mouth wash and a course of Valtrex.  currently on weekly Taxol.     INTERVAL HISTORY She comes for evaluation before Cycle 6 weekly Taxol. S/p 4 cycles of ddAC..  she is accompanied by her husband. She  reports doing well except having some gas pain last night which resolved spontaneously. Denies fever, chills, or nausea/votming. Denies any numbness or tingling.   Review of Systems  Constitutional: Negative for appetite change and chills.  HENT:   Negative for lump/mass.   Eyes: Negative for eye problems.  Respiratory: Negative for chest tightness.   Cardiovascular: Negative for chest pain.  Gastrointestinal: Positive for abdominal pain. Negative for abdominal distention.       Gas pain.   Endocrine: Negative for hot flashes.  Genitourinary: Negative for difficulty urinating.   Musculoskeletal: Negative for arthralgias.  Skin: Negative for itching.  Neurological: Negative for dizziness and numbness.  Hematological: Negative for adenopathy.  Psychiatric/Behavioral: The patient is not nervous/anxious.    [name]   MEDICAL HISTORY: Past Medical History:  Diagnosis Date  . Anemia   . Arthritis    HIP-LEFT  . Benign neoplasm of colon   . Cancer ([place])   . Diabetes mellitus without complication ([place])   . H/O osteopenia   . Hypertension   . Sleep apnea    USE CPAP  . Uterine cancer [facility])     SURGICAL HISTORY: Past Surgical History:  Procedure Laterality Date  . ABDOMINAL HYSTERECTOMY    . BREAST BIOPSY Left 04/2014   negative  . BREAST EXCISIONAL BIOPSY Left 2015  . CESAREAN SECTION     X2  . HERNIA REPAIR      SOCIAL HISTORY: Social History   Socioeconomic History  . Marital status: Married    Spouse name: Not on file  .  Number of children: Not on file  . Years of education: Not on file  . Highest education level: Not on file  Social Needs  . Financial resource strain: Not on file  . Food insecurity - worry: Not on file  . Food insecurity - inability: Not on file  . Transportation needs - medical: Not on file  . Transportation needs - non-medical: Not on file  Occupational History  . Not on file  Tobacco Use  . Smoking status: Never Smoker  . Smokeless tobacco:  Never Used  Substance and Sexual Activity  . Alcohol use: No  . Drug use: No  . Sexual activity: Not on file  Other Topics Concern  . Not on file  Social History Narrative  . Not on file    FAMILY HISTORY Family History  Problem Relation Age of Onset  . Diabetes Mother   . Diabetes Father   . Diabetes Sister   . Breast cancer Neg Hx     ALLERGIES:  has No Known Allergies.  MEDICATIONS:  Current Outpatient Medications  Medication Sig Dispense Refill  . Calcium Carb-Cholecalciferol (CALCIUM 600-D PO) Take 1 tablet by mouth daily.    . chlorhexidine (PERIDEX) 0.12 % solution Use as directed 15 mLs in the mouth or throat 2 (two) times daily. 473 mL 0  . CINNAMON PO Take 2 capsules by mouth daily.    . diphenhydrAMINE (BENADRYL) 25 MG tablet Take 25 mg by mouth as needed for allergies.    [name] FIBER PO Take 1 capsule by mouth daily.    . fluticasone (FLONASE) 50 MCG/ACT nasal spray Place 2 sprays into both nostrils as needed.     . Glucosamine HCl (GLUCOSAMINE PO) Take 1 tablet by mouth daily.    [name] lidocaine-prilocaine (EMLA) cream Apply 1 application topically as needed. Apply small amount to port site at least 1 hour prior to it being accessed, cover with plastic wrap 30 g 3  . lisinopril-hydrochlorothiazide (PRINZIDE,ZESTORETIC) 20-12.5 MG per tablet Take 1 tablet by mouth daily.    [name] loperamide (IMODIUM) 2 MG capsule Take 1 capsule (2 mg total) by mouth See admin instructions. With onset of loose stool, take 51m followed by 222mevery 2 hours until 12 hours have passed without loose bowel movement. Maximum: 16 mg/day 120 capsule 1  . magic mouthwash SOLN SWISH AND SPIT 5 TO 10 MLS 4 TIMES A DAY AS NEEDED  3  . metFORMIN (GLUCOPHAGE) 500 MG tablet Take 1,000 mg by mouth 2 (two) times daily.     . Multiple Vitamin (MULTIVITAMIN) tablet Take 1 tablet by mouth daily.    . ondansetron (ZOFRAN) 8 MG tablet Take 1 tablet (8 mg total) by mouth every 8 (eight) hours as needed for nausea or  vomiting. 120 tablet 0  . sitaGLIPtin (JANUVIA) 100 MG tablet Take 100 mg by mouth daily.    . valACYclovir (VALTREX) 1000 MG tablet Take 1 tablet (1,000 mg total) by mouth 2 (two) times daily. 14 tablet 0   No current facility-administered medications for this visit.     PHYSICAL EXAMINATION:  ECOG PERFORMANCE STATUS: 0 - Asymptomatic  Vitals:   08/22/17 0851  BP: 116/78  Pulse: 88  Temp: 98.5 F (36.9 C)    Filed Weights   08/22/17 0851  Weight: 191 lb (86.6 kg)     Physical Exam  GENERAL: No distress, well nourished.  SKIN:  No rashes or significant lesions  HEAD: Normocephalic, No masses, lesions, tenderness or abnormalities  EYES: Conjunctiva pale, non icteric ENT: External ears normal ,lips , buccal mucosa, and tongue normal and mucous membranes are moist  LYMPH: No palpable cervical and axillary lymphadenopathy  LUNGS: Clear to auscultation, no crackles or wheezes HEART: Regular rate & rhythm, no murmurs, no gallops, S1 normal and S2 normal  ABDOMEN: Abdomen soft, non-tender, normal bowel sounds, I did not appreciate any  masses or organomegaly  MUSCULOSKELETAL: No CVA tenderness and no tenderness on percussion of the back or rib cage.  EXTREMITIES: No edema, no skin discoloration or tenderness NEURO: Alert & oriented, no focal motor/sensory deficits. Breast exam was performed in seated and lying down position.No palpable right breast  masses or lumps . not palpable Left breast axillary lymph nodee.  I can barely feel her left breast outer quadrant mass.   LABORATORY DATA: No recent labs.  CBC    Component Value Date/Time   WBC 5.5 08/15/2017 0824   RBC 2.81 (L) 08/15/2017 0824   HGB 9.2 (L) 08/15/2017 0824   HCT 26.5 (L) 08/15/2017 0824   PLT 265 08/15/2017 0824   MCV 94.2 08/15/2017 0824   MCH 32.8 08/15/2017 0824   MCHC 34.8 08/15/2017 0824   RDW 24.1 (H) 08/15/2017 0824   LYMPHSABS 0.5 (L) 08/15/2017 0824   MONOABS 0.5 08/15/2017 0824   EOSABS 0.3  08/15/2017 0824   BASOSABS 0.1 08/15/2017 0824   CMP Latest Ref Rng & Units 08/15/2017 08/08/2017 08/01/2017  Glucose 65 - 99 mg/dL 197(H) 148(H) 136(H)  BUN 6 - 20 mg/dL '10 10 11  ' Creatinine 0.44 - 1.00 mg/dL 0.56 0.52 0.56  Sodium 135 - 145 mmol/L 133(L) 134(L) 134(L)  Potassium 3.5 - 5.1 mmol/L 4.0 4.1 3.9  Chloride 101 - 111 mmol/L 99(L) 101 100(L)  CO2 22 - 32 mmol/L '26 26 24  ' Calcium 8.9 - 10.3 mg/dL 9.8 9.6 9.5  Total Protein 6.5 - 8.1 g/dL 6.6 6.5 6.3(L)  Total Bilirubin 0.3 - 1.2 mg/dL 0.5 0.5 0.5  Alkaline Phos 38 - 126 U/L 74 73 69  AST 15 - 41 U/L '24 25 23  ' ALT 14 - 54 U/L '20 19 19   ' RADIOGRAPHIC STUDIES: I have personally reviewed the radiological images as listed and agree with the findings in the report 04/25/2017 Mammogram Diagnostic Targeted ultrasound of the left breast demonstrates an irregular, hypoechoic mass at 1 o'clock, 3 cm from the nipple measuring 10 x 10 x 10 mm corresponding to the mass seen within the anterior upper, outer left breast. At 4 o'clock, 7 cm from the nipple, there is an irregular hypoechoic mass measuring 2.1 x 1.5 x 1.9 cm. Targeted ultrasound of the left axilla demonstrates an enlarged, abnormal axillary lymph node demonstrating cortical thickening up to 6 mm. The masses at 1 and 4 o'clock are separated by approximately 4 cm sonographically. IMPRESSION: Highly suspicious left breast masses at 4 o'clock, 7 cm from the nipple and 1 o'clock, 3 cm from the nipple. Enlarged left axillary lymph node suspicious for metastatic disease. US breast 04/25/2017 FINDINGS: There is an irregular spiculated mass in the slightly lower, outer left breast, posterior depth measuring approximately 2.6 x 2.5 x 1.9 cm. There is an additional irregular mass within the upper, outerleft breast, anterior depth measuring approximately 1 cm in size, which is suspicious for a satellite lesion. There is asymmetric density between the 2 masses suggesting contiguous  spread of disease. Mammographically, the masses are separated by approximately 6 cm and in total span approximately 9.6 cm.  Mammographic images were processed with CAD.  Targeted ultrasound of the left breast demonstrates an irregular, hypoechoic mass at 1 o'clock, 3 cm from the nipple measuring 10 x 10 x 10 mm corresponding to the mass seen within the anterior upper, outer left breast. At 4 o'clock, 7 cm from the nipple, there is an irregular hypoechoic mass measuring 2.1 x 1.5 x 1.9 cm. Targeted ultrasound of the left axilla demonstrates an enlarged, abnormal axillary lymph node demonstrating cortical thickening up to 6 mm. The masses at 1 and 4 o'clock are separated by approximately 4 cm sonographically. IMPRESSION: Highly suspicious left breast masses at 4 o'clock, 7 cm from the nipple and 1 o'clock, 3 cm from the nipple. Enlarged left axillary lymph node suspicious for metastatic disease.  07/11/2017 MRI breast Bilateral FINDINGS: Breast composition: b. Scattered fibroglandular tissue. Background parenchymal enhancement: Minimal.  Right breast: No significant change in several small nodules possibly representing normal intramammary lymph nodes, mammographically stable.  Left breast: The previously demonstrated 2.6 x 2.2 x 2.0 cm enhancing mass in the posterior aspect of the upper-outer quadrant of the left breast demonstrates significantly less enhancement. This is currently very low-grade with persistent enhancement kinetics. This currently measures 2.6 x 1.6 x 1.6 cm on image number 131 of series 10603 and in the sagittal plane.  The previously demonstrated 1.7 x 1.3 x 0.9 cm anterior enhancing mass containing a biopsy marker clip artifact currently measures 1.4 x 1.0 x 0.6 cm on image number 132 of series 10603 and in the sagittal plane. This also demonstrates some decrease in enhancement with a mixture of enhancement kinetics, including rapid wash-in/ washout.  There  are are 2 additional linear areas of non mass enhancement between these areas, currently measuring 3.9 cm in length on axial image number 121 and 3.0 cm in length on axial image number 113 of the same series.  The 1.4 x 0.7 x 0.5 cm third enhancing mass enhancement between the 2 biopsied areas previously is significantly smaller, currently measuring 0.7 x 0.3 x 0.2 cm on image number 99 of the same series and in the sagittal plane.  The residual enhancing areas in the left breast span an area measuring 10.1 cm.  Lymph nodes: No abnormal appearing lymph nodes. The previously demonstrated prominent left axillary lymph nodes are no longer prominent. Ancillary findings:  None.  IMPRESSION: 1. Partial imaging response of the known malignancy in the left breast, as described above, currently spanning 10.1 cm. 2. Complete imaging response of previously demonstrated left axillary adenopathy. 3. No evidence of malignancy on the right.   PATHOLOGY DIAGNOSIS:  A. BREAST, LEFT, 4 O'CLOCK 7 CM FROM NIPPLE; ULTRASOUND-GUIDED CORE BIOPSY:  - INVASIVE MAMMARY CARCINOMA, NO SPECIAL TYPE, GRADE 2, SEE COMMENT.  - INVASIVE CARCINOMA MEASURES 15 MM IN THIS SAMPLE.  B. BREAST, LEFT, 1 O'CLOCK, 3 CM FROM NIPPLE; ULTRASOUND-GUIDED CORE  BIOPSY:  - INVASIVE MAMMARY CARCINOMA, NO SPECIAL TYPE, GRADE 2, SEE COMMENT.  - INVASIVE CARCINOMA MEASURES 10 MM IN THIS SAMPLE.  C. LYMPH NODE, LEFT AXILLA; ULTRASOUND-GUIDED CORE BIOPSY:  - METASTATIC CARCINOMA, 7 MM METASTASIS, SEE COMMENT.    BREAST BIOMARKER TEST RESULTS - TUMOR A, 4 O'CLOCK:  Estrogen Receptor (ER) Status: POSITIVE, >90% of cells with nuclear  positivity     Average intensity of staining: Strong  Progesterone Receptor (PR) Status: POSITIVE, >90% of cells with nuclear  positivity     Average intensity of staining: Strong  HER2 (by immunohistochemistry): Equivocal (score 2+)  Percentage  of cells with uniform intense complete  membrane staining: 1%   BREAST BIOMARKER TEST RESULTS - TUMOR B, 1 O'CLOCK:  Estrogen Receptor (ER) Status: POSITIVE, >90% of cells with nuclear  positivity     Average intensity of staining: Strong  Progesterone Receptor (PR) Status: POSITIVE, 51-90% of cells with  nuclear positivity     Average intensity of staining: Moderate to strong  HER2 (by immunohistochemistry): Equivocal (score 2+)  Percentage of cells with uniform intense complete membrane staining: 2%    ADDENDUM #2   BREAST BIOMARKER TEST: HER2 FISH - TUMOR A, 4 O'CLOCK:  HER2 (ERBB2) (by in situ hybridization): NEGATIVE (not amplified)  Number of observers: 2  Number of invasive tumor cells counted: 40  Dual probe assay  Average number of HER2 signals per cell: 3.6  Average number of CEP17 signals per cell: 2.5  HER2/CEP17 ratio: 1.42   05/14/2017 MRI breast IMPRESSION: 1. Two sites of biopsy proven malignancy seen in the left breast, which span up to 11.3 cm. There are intervening areas of non mass enhancement and a small suspicious enhancing mass. 2. The biopsy-proven metastatic left axillary lymph node is identified. There are 3 other prominent left axillary lymph nodes which are not definitely pathologically enlarged. 3.  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  Cancer Staging Malignant neoplasm of lower-outer quadrant of left breast of female, estrogen receptor positive ([place]) Staging form: Breast, AJCC 8th Edition - Clinical stage from 04/25/2017: Stage IIA (cT3(m), cN1(f), cM0, G2, ER: Positive, PR: Positive, HER2: Equivocal) - Signed by Earlie Server, MD on 05/23/2017  Malignant neoplasm of upper-outer quadrant of left breast in female, estrogen receptor positive ([place]) Staging form:  Breast, AJCC 8th Edition - Clinical: No stage assigned - Unsigned  1. Encounter for antineoplastic chemotherapy   2. Malignant neoplasm of upper-outer quadrant of left breast in female, estrogen receptor positive ([place])   3. Malignant neoplasm of lower-outer quadrant of left breast of female, estrogen receptor positive ([place])   4. Breast cancer metastasized to axillary lymph node, left ([place])   5. Carcinoma of left breast, estrogen and progesterone receptor positive ([place])    1  Multifocal left breast cancer, ER+PR+, HER2-,  s/p 4 cycles of  ddAC with partial response.    Currently on weekly Taxol plan x 12  prior to surgery.  Labs reviewed. Tolerates well, no signs of neuropathy. OK to proceed to Cycle 6 weekly Taxol.  CBC CMP prir to each cycles. Check B12 and Folate. Iron panel was checked in September showed no signs of iron deficiency.; 2  check CBC, CMP Prior to each treatment cycles.  3 gas pain: OTC gas-X as needed.  She will proceed with cycle 7 Taxol on 08/29/2017 with CBC, CMP prior to chemotherapy.  Follow up in 2 weeks prior to cycle 8 Taxol.  All questions were answered. The patient knows to call the clinic with any problems, questions or concerns.    Earlie Server, MD  08/21/2017 11:01 PM

## 2017-08-22 ENCOUNTER — Other Ambulatory Visit: Payer: Self-pay

## 2017-08-22 ENCOUNTER — Encounter: Payer: Self-pay | Admitting: Oncology

## 2017-08-22 ENCOUNTER — Inpatient Hospital Stay: Payer: BLUE CROSS/BLUE SHIELD

## 2017-08-22 ENCOUNTER — Inpatient Hospital Stay (HOSPITAL_BASED_OUTPATIENT_CLINIC_OR_DEPARTMENT_OTHER): Payer: BLUE CROSS/BLUE SHIELD | Admitting: Oncology

## 2017-08-22 VITALS — BP 116/78 | HR 88 | Temp 98.5°F | Wt 191.0 lb

## 2017-08-22 DIAGNOSIS — C50412 Malignant neoplasm of upper-outer quadrant of left female breast: Secondary | ICD-10-CM

## 2017-08-22 DIAGNOSIS — G4730 Sleep apnea, unspecified: Secondary | ICD-10-CM

## 2017-08-22 DIAGNOSIS — R590 Localized enlarged lymph nodes: Secondary | ICD-10-CM

## 2017-08-22 DIAGNOSIS — Z7984 Long term (current) use of oral hypoglycemic drugs: Secondary | ICD-10-CM

## 2017-08-22 DIAGNOSIS — D6481 Anemia due to antineoplastic chemotherapy: Secondary | ICD-10-CM

## 2017-08-22 DIAGNOSIS — T451X5A Adverse effect of antineoplastic and immunosuppressive drugs, initial encounter: Principal | ICD-10-CM

## 2017-08-22 DIAGNOSIS — Z79899 Other long term (current) drug therapy: Secondary | ICD-10-CM

## 2017-08-22 DIAGNOSIS — Z9989 Dependence on other enabling machines and devices: Secondary | ICD-10-CM

## 2017-08-22 DIAGNOSIS — C50512 Malignant neoplasm of lower-outer quadrant of left female breast: Secondary | ICD-10-CM | POA: Diagnosis not present

## 2017-08-22 DIAGNOSIS — C773 Secondary and unspecified malignant neoplasm of axilla and upper limb lymph nodes: Secondary | ICD-10-CM | POA: Diagnosis not present

## 2017-08-22 DIAGNOSIS — C50912 Malignant neoplasm of unspecified site of left female breast: Secondary | ICD-10-CM

## 2017-08-22 DIAGNOSIS — Z170 Estrogen receptor positive status [ER+]: Principal | ICD-10-CM

## 2017-08-22 DIAGNOSIS — Z5111 Encounter for antineoplastic chemotherapy: Secondary | ICD-10-CM

## 2017-08-22 DIAGNOSIS — Z8542 Personal history of malignant neoplasm of other parts of uterus: Secondary | ICD-10-CM

## 2017-08-22 DIAGNOSIS — R141 Gas pain: Secondary | ICD-10-CM

## 2017-08-22 DIAGNOSIS — M1612 Unilateral primary osteoarthritis, left hip: Secondary | ICD-10-CM

## 2017-08-22 DIAGNOSIS — I10 Essential (primary) hypertension: Secondary | ICD-10-CM

## 2017-08-22 DIAGNOSIS — E119 Type 2 diabetes mellitus without complications: Secondary | ICD-10-CM | POA: Diagnosis not present

## 2017-08-22 LAB — CBC WITH DIFFERENTIAL/PLATELET
BASOS ABS: 0.1 10*3/uL (ref 0–0.1)
Basophils Relative: 1 %
EOS PCT: 4 %
Eosinophils Absolute: 0.2 10*3/uL (ref 0–0.7)
HCT: 26.8 % — ABNORMAL LOW (ref 35.0–47.0)
Hemoglobin: 9.4 g/dL — ABNORMAL LOW (ref 12.0–16.0)
LYMPHS PCT: 12 %
Lymphs Abs: 0.6 10*3/uL — ABNORMAL LOW (ref 1.0–3.6)
MCH: 33.5 pg (ref 26.0–34.0)
MCHC: 35.1 g/dL (ref 32.0–36.0)
MCV: 95.5 fL (ref 80.0–100.0)
MONO ABS: 0.7 10*3/uL (ref 0.2–0.9)
Monocytes Relative: 12 %
Neutro Abs: 3.7 10*3/uL (ref 1.4–6.5)
Neutrophils Relative %: 71 %
PLATELETS: 270 10*3/uL (ref 150–440)
RBC: 2.8 MIL/uL — ABNORMAL LOW (ref 3.80–5.20)
RDW: 21.7 % — AB (ref 11.5–14.5)
WBC: 5.3 10*3/uL (ref 3.6–11.0)

## 2017-08-22 LAB — COMPREHENSIVE METABOLIC PANEL
ALK PHOS: 75 U/L (ref 38–126)
ALT: 18 U/L (ref 14–54)
ANION GAP: 8 (ref 5–15)
AST: 24 U/L (ref 15–41)
Albumin: 3.8 g/dL (ref 3.5–5.0)
BUN: 10 mg/dL (ref 6–20)
CALCIUM: 9.6 mg/dL (ref 8.9–10.3)
CHLORIDE: 100 mmol/L — AB (ref 101–111)
CO2: 26 mmol/L (ref 22–32)
CREATININE: 0.62 mg/dL (ref 0.44–1.00)
GFR calc Af Amer: >60 mL/min (ref >60)
GFR calc non Af Amer: >60 mL/min (ref >60)
GLUCOSE: 142 mg/dL — AB (ref 65–99)
Potassium: 4 mmol/L (ref 3.5–5.1)
Sodium: 134 mmol/L — ABNORMAL LOW (ref 135–145)
Total Bilirubin: 0.4 mg/dL (ref 0.3–1.2)
Total Protein: 6.5 g/dL (ref 6.5–8.1)

## 2017-08-22 LAB — FOLATE: Folate: 59.1 ng/mL (ref >5.9)

## 2017-08-22 MED ORDER — FAMOTIDINE IN NACL 20-0.9 MG/50ML-% IV SOLN
20.0000 mg | Freq: Once | INTRAVENOUS | Status: AC
  Administered 2017-08-22: 20 mg via INTRAVENOUS
  Filled 2017-08-22: qty 50

## 2017-08-22 MED ORDER — SODIUM CHLORIDE 0.9 % IV SOLN
20.0000 mg | Freq: Once | INTRAVENOUS | Status: AC
  Administered 2017-08-22: 20 mg via INTRAVENOUS
  Filled 2017-08-22: qty 2

## 2017-08-22 MED ORDER — PACLITAXEL CHEMO INJECTION 300 MG/50ML
80.0000 mg/m2 | Freq: Once | INTRAVENOUS | Status: AC
  Administered 2017-08-22: 150 mg via INTRAVENOUS
  Filled 2017-08-22: qty 25

## 2017-08-22 MED ORDER — DIPHENHYDRAMINE HCL 50 MG/ML IJ SOLN
25.0000 mg | Freq: Once | INTRAMUSCULAR | Status: AC
  Administered 2017-08-22: 25 mg via INTRAVENOUS
  Filled 2017-08-22: qty 1

## 2017-08-22 MED ORDER — HEPARIN SOD (PORK) LOCK FLUSH 100 UNIT/ML IV SOLN
500.0000 [IU] | Freq: Once | INTRAVENOUS | Status: AC | PRN
  Administered 2017-08-22: 500 [IU]
  Filled 2017-08-22: qty 5

## 2017-08-22 MED ORDER — SODIUM CHLORIDE 0.9 % IV SOLN
Freq: Once | INTRAVENOUS | Status: AC
  Administered 2017-08-22: 10:00:00 via INTRAVENOUS
  Filled 2017-08-22: qty 1000

## 2017-08-22 NOTE — Progress Notes (Signed)
Patient here today for follow up.  Patient states no new concerns today  

## 2017-08-29 ENCOUNTER — Inpatient Hospital Stay: Payer: BLUE CROSS/BLUE SHIELD

## 2017-08-29 ENCOUNTER — Other Ambulatory Visit: Payer: Self-pay | Admitting: Oncology

## 2017-08-29 VITALS — BP 116/73 | HR 81 | Temp 98.8°F | Wt 191.2 lb

## 2017-08-29 DIAGNOSIS — C50412 Malignant neoplasm of upper-outer quadrant of left female breast: Secondary | ICD-10-CM | POA: Diagnosis not present

## 2017-08-29 DIAGNOSIS — Z170 Estrogen receptor positive status [ER+]: Principal | ICD-10-CM

## 2017-08-29 DIAGNOSIS — D6481 Anemia due to antineoplastic chemotherapy: Secondary | ICD-10-CM

## 2017-08-29 DIAGNOSIS — Z5111 Encounter for antineoplastic chemotherapy: Secondary | ICD-10-CM

## 2017-08-29 DIAGNOSIS — C50512 Malignant neoplasm of lower-outer quadrant of left female breast: Secondary | ICD-10-CM

## 2017-08-29 DIAGNOSIS — T451X5A Adverse effect of antineoplastic and immunosuppressive drugs, initial encounter: Secondary | ICD-10-CM

## 2017-08-29 LAB — CBC WITH DIFFERENTIAL/PLATELET
BASOS ABS: 0.1 10*3/uL (ref 0–0.1)
BASOS PCT: 1 %
EOS ABS: 0.2 10*3/uL (ref 0–0.7)
EOS PCT: 4 %
HCT: 27.8 % — ABNORMAL LOW (ref 35.0–47.0)
Hemoglobin: 9.9 g/dL — ABNORMAL LOW (ref 12.0–16.0)
Lymphocytes Relative: 11 %
Lymphs Abs: 0.7 10*3/uL — ABNORMAL LOW (ref 1.0–3.6)
MCH: 33.9 pg (ref 26.0–34.0)
MCHC: 35.4 g/dL (ref 32.0–36.0)
MCV: 95.8 fL (ref 80.0–100.0)
MONO ABS: 0.8 10*3/uL (ref 0.2–0.9)
Monocytes Relative: 12 %
Neutro Abs: 4.5 10*3/uL (ref 1.4–6.5)
Neutrophils Relative %: 72 %
PLATELETS: 334 10*3/uL (ref 150–440)
RBC: 2.91 MIL/uL — AB (ref 3.80–5.20)
RDW: 19.4 % — AB (ref 11.5–14.5)
WBC: 6.2 10*3/uL (ref 3.6–11.0)

## 2017-08-29 LAB — COMPREHENSIVE METABOLIC PANEL
ALK PHOS: 82 U/L (ref 38–126)
ALT: 19 U/L (ref 14–54)
AST: 26 U/L (ref 15–41)
Albumin: 3.9 g/dL (ref 3.5–5.0)
Anion gap: 10 (ref 5–15)
BILIRUBIN TOTAL: 0.6 mg/dL (ref 0.3–1.2)
BUN: 12 mg/dL (ref 6–20)
CALCIUM: 9.8 mg/dL (ref 8.9–10.3)
CO2: 24 mmol/L (ref 22–32)
CREATININE: 0.67 mg/dL (ref 0.44–1.00)
Chloride: 98 mmol/L — ABNORMAL LOW (ref 101–111)
GFR calc Af Amer: >60 mL/min (ref >60)
Glucose, Bld: 149 mg/dL — ABNORMAL HIGH (ref 65–99)
POTASSIUM: 4.1 mmol/L (ref 3.5–5.1)
Sodium: 132 mmol/L — ABNORMAL LOW (ref 135–145)
TOTAL PROTEIN: 6.7 g/dL (ref 6.5–8.1)

## 2017-08-29 LAB — VITAMIN B12: VITAMIN B 12: 264 pg/mL (ref 180–914)

## 2017-08-29 MED ORDER — HEPARIN SOD (PORK) LOCK FLUSH 100 UNIT/ML IV SOLN
500.0000 [IU] | Freq: Once | INTRAVENOUS | Status: AC | PRN
  Administered 2017-08-29: 500 [IU]
  Filled 2017-08-29: qty 5

## 2017-08-29 MED ORDER — DIPHENHYDRAMINE HCL 50 MG/ML IJ SOLN
25.0000 mg | Freq: Once | INTRAMUSCULAR | Status: AC
  Administered 2017-08-29: 25 mg via INTRAVENOUS
  Filled 2017-08-29: qty 1

## 2017-08-29 MED ORDER — SODIUM CHLORIDE 0.9 % IV SOLN
Freq: Once | INTRAVENOUS | Status: AC
  Administered 2017-08-29: 09:00:00 via INTRAVENOUS
  Filled 2017-08-29: qty 1000

## 2017-08-29 MED ORDER — PACLITAXEL CHEMO INJECTION 300 MG/50ML
80.0000 mg/m2 | Freq: Once | INTRAVENOUS | Status: AC
  Administered 2017-08-29: 150 mg via INTRAVENOUS
  Filled 2017-08-29: qty 25

## 2017-08-29 MED ORDER — FAMOTIDINE IN NACL 20-0.9 MG/50ML-% IV SOLN
20.0000 mg | Freq: Once | INTRAVENOUS | Status: AC
  Administered 2017-08-29: 20 mg via INTRAVENOUS
  Filled 2017-08-29: qty 50

## 2017-08-29 MED ORDER — SODIUM CHLORIDE 0.9 % IV SOLN
20.0000 mg | Freq: Once | INTRAVENOUS | Status: AC
  Administered 2017-08-29: 20 mg via INTRAVENOUS
  Filled 2017-08-29: qty 2

## 2017-08-30 ENCOUNTER — Other Ambulatory Visit: Payer: Self-pay | Admitting: Oncology

## 2017-09-04 NOTE — Progress Notes (Signed)
[facility] Follow Up Visit.  Patient Care Team: Glendon Axe, MD as PCP - General (Internal Medicine)  CHIEF COMPLAINTS/PURPOSE OF CONSULTATION: History of Uterine Cancer, treated with radiation.   Pertinent Oncology History Dominique Guzman 66 y.o. female with PMH listed as below is referred by Dr.Schermerhorn to [name] for evaluation and management of newly diagnosed breast cancer. Patient had a history a left breast mass biopsy followed by breast excisonal biopsy in 2015 with benign pathology.  She also has a history of Uterine cancer which was treated with RT with Dr.Chrystal.  She underwent routine screening mammogram on 04/17/2017 which recommended a possible mass on the left breast. Left diagnostic mammogram showed a highly suspicious left breast mass at 4 o'clock, 7 cm from the nipple and 1 o'clock,,  About 3 cm from the nipple. Enlarged left axillary lymph node suspicious for metastatic disease.  1 US breast confirmed a left breast mass of 2.1cm, 4 o'clock, and 1.1 cm mass at 1 o'clock.The masses at 1 and 4 o'clock are separated by approximately 4 cm sonographically 2 Biopsy of the two mass and axillary lymph node revealed invasive carcinoma, grade 2. Both are ER,PR positive. HER2 equivocal, FISH pending. 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  3 05/14/2017 MRI breast showed Two sites of biopsy proven malignancy seen in the left breast, which span up to 11.3 cm. There are intervening areas of non mass enhancement and a small suspicious enhancing mass. 4 Case was discussed on breast tumor board on 05/21/2017 and the panel agree with neoadjuvant ddAC to T followed by surgery.  5 s/p 4 cycles ddAC, Tolerates well except mucositis which improved with magic mouth wash and a course of Valtrex.  currently on weekly Taxol.     INTERVAL HISTORY She comes for evaluation before Cycle 8 weekly Taxol. S/p 4 cycles of ddAC..  she is accompanied by her husband. She  reports doing well except some sensory changes of her right hand 3 finger tips. This happened about 2 weeks ago after she used these 3 fingers to pull out dry ice package.  Denies fever, chills, or nausea/votming. Denies any numbness or tingling.   Review of Systems  Constitutional: Negative for appetite change and chills.  HENT:   Negative for lump/mass.   Eyes: Negative for eye problems.  Respiratory: Negative for chest tightness.   Cardiovascular: Negative for chest pain.  Gastrointestinal: Negative for abdominal distention and abdominal pain.  Endocrine: Negative for hot flashes.  Genitourinary: Negative for difficulty urinating.   Musculoskeletal: Negative for arthralgias.  Skin: Negative for itching.  Neurological: Positive for numbness. Negative for dizziness.  Hematological: Negative for adenopathy.  Psychiatric/Behavioral: The patient is not nervous/anxious.    [name]   MEDICAL HISTORY: Past Medical History:  Diagnosis Date  . Anemia   . Arthritis    HIP-LEFT  . Benign neoplasm of colon   . Cancer ([place])   . Diabetes mellitus without complication ([place])   . H/O osteopenia   . Hypertension   . Sleep apnea    USE CPAP  . Uterine cancer [facility])     SURGICAL HISTORY: Past Surgical History:  Procedure Laterality Date  . ABDOMINAL HYSTERECTOMY    . BREAST BIOPSY Left 04/2014   negative  . BREAST EXCISIONAL BIOPSY Left 2015  . CESAREAN SECTION     X2  . COLONOSCOPY WITH PROPOFOL N/A 04/15/2015   Procedure: COLONOSCOPY WITH PROPOFOL;  Surgeon: Hulen Luster, MD;  Location:  [facility] ENDOSCOPY;  Service: Gastroenterology;  Laterality: N/A;  . HERNIA REPAIR    . PORTACATH PLACEMENT Right 05/22/2017   Procedure: INSERTION PORT-A-CATH;  Surgeon: Leonie Green, MD;  Location: [facility] ORS;  Service: General;  Laterality: Right;    SOCIAL HISTORY: Social History   Socioeconomic History  . Marital status: Married    Spouse name: Not on file  . Number of children: Not on file  . Years  of education: Not on file  . Highest education level: Not on file  Social Needs  . Financial resource strain: Not on file  . Food insecurity - worry: Not on file  . Food insecurity - inability: Not on file  . Transportation needs - medical: Not on file  . Transportation needs - non-medical: Not on file  Occupational History  . Not on file  Tobacco Use  . Smoking status: Never Smoker  . Smokeless tobacco: Never Used  Substance and Sexual Activity  . Alcohol use: No  . Drug use: No  . Sexual activity: Not on file  Other Topics Concern  . Not on file  Social History Narrative  . Not on file    FAMILY HISTORY Family History  Problem Relation Age of Onset  . Diabetes Mother   . Diabetes Father   . Diabetes Sister   . Breast cancer Neg Hx     ALLERGIES:  has No Known Allergies.  MEDICATIONS:  Current Outpatient Medications  Medication Sig Dispense Refill  . Calcium Carb-Cholecalciferol (CALCIUM 600-D PO) Take 1 tablet by mouth daily.    . chlorhexidine (PERIDEX) 0.12 % solution USE AS DIRECTED 15 ML IN THE MOUTH OR THROAT TWICE A DAY 473 mL 0  . CINNAMON PO Take 2 capsules by mouth daily.    . diphenhydrAMINE (BENADRYL) 25 MG tablet Take 25 mg by mouth as needed for allergies.    [name] FIBER PO Take 1 capsule by mouth daily.    . fluticasone (FLONASE) 50 MCG/ACT nasal spray Place 2 sprays into both nostrils as needed.     . Glucosamine HCl (GLUCOSAMINE PO) Take 1 tablet by mouth daily.    [name] lidocaine-prilocaine (EMLA) cream Apply 1 application topically as needed. Apply small amount to port site at least 1 hour prior to it being accessed, cover with plastic wrap 30 g 3  . lisinopril-hydrochlorothiazide (PRINZIDE,ZESTORETIC) 20-12.5 MG per tablet Take 1 tablet by mouth daily.    [name] loperamide (IMODIUM) 2 MG capsule Take 1 capsule (2 mg total) by mouth See admin instructions. With onset of loose stool, take 53m followed by 231mevery 2 hours until 12 hours have passed without loose  bowel movement. Maximum: 16 mg/day 120 capsule 1  . magic mouthwash SOLN SWISH AND SPIT 5 TO 10 MLS 4 TIMES A DAY AS NEEDED  3  . metFORMIN (GLUCOPHAGE) 500 MG tablet Take 1,000 mg by mouth 2 (two) times daily.     . Multiple Vitamin (MULTIVITAMIN) tablet Take 1 tablet by mouth daily.    . ondansetron (ZOFRAN) 8 MG tablet Take 1 tablet (8 mg total) by mouth every 8 (eight) hours as needed for nausea or vomiting. 120 tablet 0  . sitaGLIPtin (JANUVIA) 100 MG tablet Take 100 mg by mouth daily.    . valACYclovir (VALTREX) 1000 MG tablet Take 1 tablet (1,000 mg total) by mouth 2 (two) times daily. 14 tablet 0   No current facility-administered medications for this visit.     PHYSICAL EXAMINATION:  ECOG PERFORMANCE  STATUS: 1 - Symptomatic but completely ambulatory  Vitals:   09/05/17 0842  BP: 113/62  Pulse: 95  Resp: 16  Temp: 98.1 F (36.7 C)    Filed Weights   09/05/17 0842  Weight: 190 lb (86.2 kg)     Physical Exam  GENERAL: No distress, well nourished.  SKIN:  No rashes or significant lesions  HEAD: Normocephalic, No masses, lesions, tenderness or abnormalities  EYES: Conjunctiva are pale, non icteric ENT: External ears normal ,lips , buccal mucosa, and tongue normal and mucous membranes are moist  LYMPH: No palpable cervical and axillary lymphadenopathy  LUNGS: Clear to auscultation, no crackles or wheezes HEART: Regular rate & rhythm, no murmurs, no gallops, S1 normal and S2 normal  ABDOMEN: Abdomen soft, non-tender, normal bowel sounds, I did not appreciate any  masses or organomegaly  MUSCULOSKELETAL: No CVA tenderness and no tenderness on percussion of the back or rib cage.  EXTREMITIES: No edema, no skin discoloration or tenderness NEURO: Alert & oriented, no focal motor/sensory deficits. Breast exam was performed in seated and lying down position. No palpable right breast  masses or lumps . not palpable Left breast axillary lymph nodee.  I can barely feel her left  breast outer quadrant mass.   LABORATORY DATA: No recent labs.  CBC    Component Value Date/Time   WBC 6.2 08/29/2017 0854   RBC 2.91 (L) 08/29/2017 0854   HGB 9.9 (L) 08/29/2017 0854   HCT 27.8 (L) 08/29/2017 0854   PLT 334 08/29/2017 0854   MCV 95.8 08/29/2017 0854   MCH 33.9 08/29/2017 0854   MCHC 35.4 08/29/2017 0854   RDW 19.4 (H) 08/29/2017 0854   LYMPHSABS 0.7 (L) 08/29/2017 0854   MONOABS 0.8 08/29/2017 0854   EOSABS 0.2 08/29/2017 0854   BASOSABS 0.1 08/29/2017 0854   CMP Latest Ref Rng & Units 08/29/2017 08/22/2017 08/15/2017  Glucose 65 - 99 mg/dL 149(H) 142(H) 197(H)  BUN 6 - 20 mg/dL _0 Creatinine 0.44 - 1.00 mg/dL 0.67 0.62 0.56  Sodium 135 - 145 mmol/L 132(L) 134(L) 133(L)  Potassium 3.5 - 5.1 mmol/L 4.1 4.0 4.0  Chloride 101 - 111 mmol/L 98(L) 100(L) 99(L)  CO2 22 - 32 mmol/L _1 Calcium 8.9 - 10.3 mg/dL 9.8 9.6 9.8  Total Protein 6.5 - 8.1 g/dL 6.7 6.5 6.6  Total Bilirubin 0.3 - 1.2 mg/dL 0.6 0.4 0.5  Alkaline Phos 38 - 126 U/L 82 75 74  AST 15 - 41 U/L _2 ALT 14 - 54 U/L _3 RADIOGRAPHIC STUDIES: I have personally reviewed the radiological images as listed and agree with the findings in the report 04/25/2017 Mammogram Diagnostic Targeted ultrasound of the left breast demonstrates an irregular, hypoechoic mass at 1 o'clock, 3 cm from the nipple measuring 10 x 10 x 10 mm corresponding to the mass seen within the anterior upper, outer left breast. At 4 o'clock, 7 cm from the nipple, there is an irregular hypoechoic mass measuring 2.1 x 1.5 x 1.9 cm. Targeted ultrasound of the left axilla demonstrates an enlarged, abnormal axillary lymph node demonstrating cortical thickening up to 6 mm. The masses at 1 and 4 o'clock are separated by approximately 4 cm sonographically. IMPRESSION: Highly suspicious left breast masses at 4 o'clock, 7 cm from the nipple and 1 o'clock, 3 cm from the nipple. Enlarged left axillary lymph node  suspicious for metastatic disease. US breast 04/25/2017 FINDINGS: There is an irregular  spiculated mass in the slightly lower, outer left breast, posterior depth measuring approximately 2.6 x 2.5 x 1.9 cm. There is an additional irregular mass within the upper, outerleft breast, anterior depth measuring approximately 1 cm in size, which is suspicious for a satellite lesion. There is asymmetric density between the 2 masses suggesting contiguous spread of disease. Mammographically, the masses are separated by approximately 6 cm and in total span approximately 9.6 cm.  Mammographic images were processed with CAD.  Targeted ultrasound of the left breast demonstrates an irregular, hypoechoic mass at 1 o'clock, 3 cm from the nipple measuring 10 x 10 x 10 mm corresponding to the mass seen within the anterior upper, outer left breast. At 4 o'clock, 7 cm from the nipple, there is an irregular hypoechoic mass measuring 2.1 x 1.5 x 1.9 cm. Targeted ultrasound of the left axilla demonstrates an enlarged, abnormal axillary lymph node demonstrating cortical thickening up to 6 mm. The masses at 1 and 4 o'clock are separated by approximately 4 cm sonographically. IMPRESSION: Highly suspicious left breast masses at 4 o'clock, 7 cm from the nipple and 1 o'clock, 3 cm from the nipple. Enlarged left axillary lymph node suspicious for metastatic disease.  07/11/2017 MRI breast Bilateral FINDINGS: Breast composition: b. Scattered fibroglandular tissue. Background parenchymal enhancement: Minimal.  Right breast: No significant change in several small nodules possibly representing normal intramammary lymph nodes, mammographically stable.  Left breast: The previously demonstrated 2.6 x 2.2 x 2.0 cm enhancing mass in the posterior aspect of the upper-outer quadrant of the left breast demonstrates significantly less enhancement. This is currently very low-grade with persistent enhancement kinetics. This  currently measures 2.6 x 1.6 x 1.6 cm on image number 131 of series 10603 and in the sagittal plane.  The previously demonstrated 1.7 x 1.3 x 0.9 cm anterior enhancing mass containing a biopsy marker clip artifact currently measures 1.4 x 1.0 x 0.6 cm on image number 132 of series 10603 and in the sagittal plane. This also demonstrates some decrease in enhancement with a mixture of enhancement kinetics, including rapid wash-in/ washout.  There are are 2 additional linear areas of non mass enhancement between these areas, currently measuring 3.9 cm in length on axial image number 121 and 3.0 cm in length on axial image number 113 of the same series.  The 1.4 x 0.7 x 0.5 cm third enhancing mass enhancement between the 2 biopsied areas previously is significantly smaller, currently measuring 0.7 x 0.3 x 0.2 cm on image number 99 of the same series and in the sagittal plane.  The residual enhancing areas in the left breast span an area measuring 10.1 cm.  Lymph nodes: No abnormal appearing lymph nodes. The previously demonstrated prominent left axillary lymph nodes are no longer prominent. Ancillary findings:  None.  IMPRESSION: 1. Partial imaging response of the known malignancy in the left breast, as described above, currently spanning 10.1 cm. 2. Complete imaging response of previously demonstrated left axillary adenopathy. 3. No evidence of malignancy on the right.   PATHOLOGY DIAGNOSIS:  A. BREAST, LEFT, 4 O'CLOCK 7 CM FROM NIPPLE; ULTRASOUND-GUIDED CORE BIOPSY:  - INVASIVE MAMMARY CARCINOMA, NO SPECIAL TYPE, GRADE 2, SEE COMMENT.  - INVASIVE CARCINOMA MEASURES 15 MM IN THIS SAMPLE.  B. BREAST, LEFT, 1 O'CLOCK, 3 CM FROM NIPPLE; ULTRASOUND-GUIDED CORE  BIOPSY:  - INVASIVE MAMMARY CARCINOMA, NO SPECIAL TYPE, GRADE 2, SEE COMMENT.  - INVASIVE CARCINOMA MEASURES 10 MM IN THIS SAMPLE.  C. LYMPH NODE, LEFT AXILLA; ULTRASOUND-GUIDED CORE  BIOPSY:  - METASTATIC CARCINOMA, 7 MM  METASTASIS, SEE COMMENT.    BREAST BIOMARKER TEST RESULTS - TUMOR A, 4 O'CLOCK:  Estrogen Receptor (ER) Status: POSITIVE, >90% of cells with nuclear  positivity     Average intensity of staining: Strong  Progesterone Receptor (PR) Status: POSITIVE, >90% of cells with nuclear  positivity     Average intensity of staining: Strong  HER2 (by immunohistochemistry): Equivocal (score 2+)  Percentage of cells with uniform intense complete membrane staining: 1%   BREAST BIOMARKER TEST RESULTS - TUMOR B, 1 O'CLOCK:  Estrogen Receptor (ER) Status: POSITIVE, >90% of cells with nuclear  positivity     Average intensity of staining: Strong  Progesterone Receptor (PR) Status: POSITIVE, 51-90% of cells with  nuclear positivity     Average intensity of staining: Moderate to strong  HER2 (by immunohistochemistry): Equivocal (score 2+)  Percentage of cells with uniform intense complete membrane staining: 2%    ADDENDUM #2   BREAST BIOMARKER TEST: HER2 FISH - TUMOR A, 4 O'CLOCK:  HER2 (ERBB2) (by in situ hybridization): NEGATIVE (not amplified)  Number of observers: 2  Number of invasive tumor cells counted: 40  Dual probe assay  Average number of HER2 signals per cell: 3.6  Average number of CEP17 signals per cell: 2.5  HER2/CEP17 ratio: 1.42   05/14/2017 MRI breast IMPRESSION: 1. Two sites of biopsy proven malignancy seen in the left breast, which span up to 11.3 cm. There are intervening areas of non mass enhancement and a small suspicious enhancing mass. 2. The biopsy-proven metastatic left axillary lymph node is identified. There are 3 other prominent left axillary lymph nodes which are not definitely pathologically enlarged. 3.  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  Cancer Staging Malignant neoplasm of lower-outer quadrant of left breast of female, estrogen receptor positive ([place]) Staging form: Breast, AJCC 8th Edition - Clinical stage from 04/25/2017: Stage IIA (cT3(m), cN1(f), cM0, G2, ER: Positive, PR: Positive, HER2: Equivocal) - Signed by Earlie Server, MD on 05/23/2017  Malignant neoplasm of upper-outer quadrant of left breast in female, estrogen receptor positive ([place]) Staging form: Breast, AJCC 8th Edition - Clinical: No stage assigned - Unsigned  Normal  B12 and Folate. Iron panel was checked in September showed no signs of iron deficiency.;  1. Malignant neoplasm of upper-outer quadrant of left breast in female, estrogen receptor positive ([place])   2. Malignant neoplasm of lower-outer quadrant of left breast of female, estrogen receptor positive ([place])   3. Encounter for antineoplastic chemotherapy   4. Breast cancer metastasized to axillary lymph node, left ([place])   5. Carcinoma of left breast, estrogen and progesterone receptor positive ([place])   6. History of uterine cancer   7. Dehydration   8. Numbness of fingers    # Multifocal left breast cancer, ER+PR+, HER2-,  s/p 4 cycles of  ddAC with partial response.    Currently on weekly Taxol plan x 12  prior to surgery. Okay to proceed cycle 8 weekly Taxol. # Questionable asymmetric numbness in the sensory changes of her right 3 fingertips, possibly secondary to frostbite from dry ice. I discussed with patient that can continue to monitor this. If persistent, not improving after few days, we'll plan to dose reduce Taxol for 20%  She will proceed with  cycle 9 Taxol in 1 week with CBC, CMP prior to chemotherapy.  Follow up in 2 weeks prior to cycle 10 Taxol.  All questions were answered. The patient knows to call the clinic with any problems, questions or concerns.    Earlie Server, MD  09/04/2017 10:21 PM

## 2017-09-05 ENCOUNTER — Inpatient Hospital Stay (HOSPITAL_BASED_OUTPATIENT_CLINIC_OR_DEPARTMENT_OTHER): Payer: BLUE CROSS/BLUE SHIELD | Admitting: Oncology

## 2017-09-05 ENCOUNTER — Encounter: Payer: Self-pay | Admitting: Oncology

## 2017-09-05 ENCOUNTER — Inpatient Hospital Stay: Payer: BLUE CROSS/BLUE SHIELD

## 2017-09-05 VITALS — BP 113/62 | HR 95 | Temp 98.1°F | Resp 16 | Wt 190.0 lb

## 2017-09-05 DIAGNOSIS — I10 Essential (primary) hypertension: Secondary | ICD-10-CM

## 2017-09-05 DIAGNOSIS — Z170 Estrogen receptor positive status [ER+]: Principal | ICD-10-CM

## 2017-09-05 DIAGNOSIS — E119 Type 2 diabetes mellitus without complications: Secondary | ICD-10-CM

## 2017-09-05 DIAGNOSIS — E860 Dehydration: Secondary | ICD-10-CM

## 2017-09-05 DIAGNOSIS — C50412 Malignant neoplasm of upper-outer quadrant of left female breast: Secondary | ICD-10-CM

## 2017-09-05 DIAGNOSIS — C50912 Malignant neoplasm of unspecified site of left female breast: Secondary | ICD-10-CM

## 2017-09-05 DIAGNOSIS — M1612 Unilateral primary osteoarthritis, left hip: Secondary | ICD-10-CM

## 2017-09-05 DIAGNOSIS — Z8542 Personal history of malignant neoplasm of other parts of uterus: Secondary | ICD-10-CM

## 2017-09-05 DIAGNOSIS — Z7984 Long term (current) use of oral hypoglycemic drugs: Secondary | ICD-10-CM

## 2017-09-05 DIAGNOSIS — R200 Anesthesia of skin: Secondary | ICD-10-CM | POA: Diagnosis not present

## 2017-09-05 DIAGNOSIS — Z5111 Encounter for antineoplastic chemotherapy: Secondary | ICD-10-CM

## 2017-09-05 DIAGNOSIS — Z1721 Progesterone receptor positive status: Secondary | ICD-10-CM

## 2017-09-05 DIAGNOSIS — R590 Localized enlarged lymph nodes: Secondary | ICD-10-CM

## 2017-09-05 DIAGNOSIS — C773 Secondary and unspecified malignant neoplasm of axilla and upper limb lymph nodes: Secondary | ICD-10-CM

## 2017-09-05 DIAGNOSIS — C50512 Malignant neoplasm of lower-outer quadrant of left female breast: Secondary | ICD-10-CM | POA: Diagnosis not present

## 2017-09-05 DIAGNOSIS — M8580 Other specified disorders of bone density and structure, unspecified site: Secondary | ICD-10-CM

## 2017-09-05 DIAGNOSIS — G4730 Sleep apnea, unspecified: Secondary | ICD-10-CM | POA: Diagnosis not present

## 2017-09-05 DIAGNOSIS — Z9989 Dependence on other enabling machines and devices: Secondary | ICD-10-CM

## 2017-09-05 DIAGNOSIS — Z79899 Other long term (current) drug therapy: Secondary | ICD-10-CM

## 2017-09-05 LAB — CBC WITH DIFFERENTIAL/PLATELET
BASOS ABS: 0 10*3/uL (ref 0–0.1)
BASOS PCT: 1 %
EOS ABS: 0.2 10*3/uL (ref 0–0.7)
Eosinophils Relative: 4 %
HEMATOCRIT: 27.3 % — AB (ref 35.0–47.0)
Hemoglobin: 9.5 g/dL — ABNORMAL LOW (ref 12.0–16.0)
Lymphocytes Relative: 11 %
Lymphs Abs: 0.6 10*3/uL — ABNORMAL LOW (ref 1.0–3.6)
MCH: 33.6 pg (ref 26.0–34.0)
MCHC: 34.8 g/dL (ref 32.0–36.0)
MCV: 96.5 fL (ref 80.0–100.0)
MONO ABS: 0.7 10*3/uL (ref 0.2–0.9)
Monocytes Relative: 13 %
NEUTROS ABS: 3.8 10*3/uL (ref 1.4–6.5)
Neutrophils Relative %: 71 %
PLATELETS: 310 10*3/uL (ref 150–440)
RBC: 2.83 MIL/uL — ABNORMAL LOW (ref 3.80–5.20)
RDW: 18.6 % — AB (ref 11.5–14.5)
WBC: 5.3 10*3/uL (ref 3.6–11.0)

## 2017-09-05 LAB — COMPREHENSIVE METABOLIC PANEL
ALK PHOS: 68 U/L (ref 38–126)
ALT: 17 U/L (ref 14–54)
ANION GAP: 9 (ref 5–15)
AST: 22 U/L (ref 15–41)
Albumin: 3.7 g/dL (ref 3.5–5.0)
BILIRUBIN TOTAL: 0.5 mg/dL (ref 0.3–1.2)
BUN: 13 mg/dL (ref 6–20)
CALCIUM: 9.6 mg/dL (ref 8.9–10.3)
CO2: 23 mmol/L (ref 22–32)
CREATININE: 0.63 mg/dL (ref 0.44–1.00)
Chloride: 99 mmol/L — ABNORMAL LOW (ref 101–111)
GFR calc non Af Amer: >60 mL/min (ref >60)
GLUCOSE: 173 mg/dL — AB (ref 65–99)
Potassium: 3.9 mmol/L (ref 3.5–5.1)
Sodium: 131 mmol/L — ABNORMAL LOW (ref 135–145)
TOTAL PROTEIN: 6.5 g/dL (ref 6.5–8.1)

## 2017-09-05 MED ORDER — DIPHENHYDRAMINE HCL 50 MG/ML IJ SOLN
25.0000 mg | Freq: Once | INTRAMUSCULAR | Status: AC
Start: 2017-09-05 — End: 2017-09-05
  Administered 2017-09-05: 25 mg via INTRAVENOUS
  Filled 2017-09-05: qty 1

## 2017-09-05 MED ORDER — DEXTROSE 5 % IV SOLN
80.0000 mg/m2 | Freq: Once | INTRAVENOUS | Status: AC
  Administered 2017-09-05: 150 mg via INTRAVENOUS
  Filled 2017-09-05: qty 25

## 2017-09-05 MED ORDER — SODIUM CHLORIDE 0.9 % IV SOLN
Freq: Once | INTRAVENOUS | Status: AC
  Administered 2017-09-05: 10:00:00 via INTRAVENOUS
  Filled 2017-09-05: qty 1000

## 2017-09-05 MED ORDER — DEXAMETHASONE SODIUM PHOSPHATE 100 MG/10ML IJ SOLN
20.0000 mg | Freq: Once | INTRAMUSCULAR | Status: AC
  Administered 2017-09-05: 20 mg via INTRAVENOUS
  Filled 2017-09-05: qty 2

## 2017-09-05 MED ORDER — INFLUENZA VAC SPLIT QUAD 0.5 ML IM SUSY
0.5000 mL | PREFILLED_SYRINGE | Freq: Once | INTRAMUSCULAR | Status: AC
  Administered 2017-09-05: 0.5 mL via INTRAMUSCULAR
  Filled 2017-09-05: qty 0.5

## 2017-09-05 MED ORDER — FAMOTIDINE IN NACL 20-0.9 MG/50ML-% IV SOLN
20.0000 mg | Freq: Once | INTRAVENOUS | Status: AC
  Administered 2017-09-05: 20 mg via INTRAVENOUS
  Filled 2017-09-05: qty 50

## 2017-09-05 MED ORDER — HEPARIN SOD (PORK) LOCK FLUSH 100 UNIT/ML IV SOLN
INTRAVENOUS | Status: AC
  Filled 2017-09-05: qty 5

## 2017-09-05 MED ORDER — SODIUM CHLORIDE 0.9% FLUSH
10.0000 mL | INTRAVENOUS | Status: DC | PRN
  Filled 2017-09-05: qty 10

## 2017-09-05 MED ORDER — HEPARIN SOD (PORK) LOCK FLUSH 100 UNIT/ML IV SOLN
500.0000 [IU] | Freq: Once | INTRAVENOUS | Status: AC
  Administered 2017-09-05: 500 [IU] via INTRAVENOUS

## 2017-09-05 NOTE — Progress Notes (Signed)
Patient here today for labs and follow up with Taxol. She denies having any pain and states that she is feeling well. She does report having some numbness in her fingertips and is having trouble opening things.

## 2017-09-12 ENCOUNTER — Inpatient Hospital Stay: Payer: BLUE CROSS/BLUE SHIELD | Attending: Oncology

## 2017-09-12 ENCOUNTER — Inpatient Hospital Stay: Payer: BLUE CROSS/BLUE SHIELD

## 2017-09-12 VITALS — BP 114/64 | HR 86 | Temp 98.0°F | Resp 18 | Wt 190.0 lb

## 2017-09-12 DIAGNOSIS — Z79899 Other long term (current) drug therapy: Secondary | ICD-10-CM | POA: Diagnosis not present

## 2017-09-12 DIAGNOSIS — Z5111 Encounter for antineoplastic chemotherapy: Secondary | ICD-10-CM | POA: Diagnosis not present

## 2017-09-12 DIAGNOSIS — Z8542 Personal history of malignant neoplasm of other parts of uterus: Secondary | ICD-10-CM | POA: Insufficient documentation

## 2017-09-12 DIAGNOSIS — R5383 Other fatigue: Secondary | ICD-10-CM | POA: Diagnosis not present

## 2017-09-12 DIAGNOSIS — Z923 Personal history of irradiation: Secondary | ICD-10-CM | POA: Insufficient documentation

## 2017-09-12 DIAGNOSIS — Z90710 Acquired absence of both cervix and uterus: Secondary | ICD-10-CM | POA: Insufficient documentation

## 2017-09-12 DIAGNOSIS — M1990 Unspecified osteoarthritis, unspecified site: Secondary | ICD-10-CM | POA: Insufficient documentation

## 2017-09-12 DIAGNOSIS — C50512 Malignant neoplasm of lower-outer quadrant of left female breast: Secondary | ICD-10-CM | POA: Insufficient documentation

## 2017-09-12 DIAGNOSIS — Z7984 Long term (current) use of oral hypoglycemic drugs: Secondary | ICD-10-CM | POA: Insufficient documentation

## 2017-09-12 DIAGNOSIS — R590 Localized enlarged lymph nodes: Secondary | ICD-10-CM | POA: Diagnosis not present

## 2017-09-12 DIAGNOSIS — Z170 Estrogen receptor positive status [ER+]: Secondary | ICD-10-CM | POA: Insufficient documentation

## 2017-09-12 DIAGNOSIS — Z9989 Dependence on other enabling machines and devices: Secondary | ICD-10-CM | POA: Insufficient documentation

## 2017-09-12 DIAGNOSIS — G4730 Sleep apnea, unspecified: Secondary | ICD-10-CM | POA: Insufficient documentation

## 2017-09-12 DIAGNOSIS — E119 Type 2 diabetes mellitus without complications: Secondary | ICD-10-CM | POA: Diagnosis not present

## 2017-09-12 DIAGNOSIS — T451X5S Adverse effect of antineoplastic and immunosuppressive drugs, sequela: Secondary | ICD-10-CM | POA: Insufficient documentation

## 2017-09-12 DIAGNOSIS — C50412 Malignant neoplasm of upper-outer quadrant of left female breast: Secondary | ICD-10-CM

## 2017-09-12 DIAGNOSIS — C773 Secondary and unspecified malignant neoplasm of axilla and upper limb lymph nodes: Secondary | ICD-10-CM | POA: Diagnosis not present

## 2017-09-12 DIAGNOSIS — I10 Essential (primary) hypertension: Secondary | ICD-10-CM | POA: Insufficient documentation

## 2017-09-12 DIAGNOSIS — M8580 Other specified disorders of bone density and structure, unspecified site: Secondary | ICD-10-CM | POA: Insufficient documentation

## 2017-09-12 DIAGNOSIS — G620 Drug-induced polyneuropathy: Secondary | ICD-10-CM | POA: Insufficient documentation

## 2017-09-12 LAB — COMPREHENSIVE METABOLIC PANEL
ALK PHOS: 86 U/L (ref 38–126)
ALT: 17 U/L (ref 14–54)
ANION GAP: 8 (ref 5–15)
AST: 23 U/L (ref 15–41)
Albumin: 3.5 g/dL (ref 3.5–5.0)
BUN: 13 mg/dL (ref 6–20)
CALCIUM: 9.6 mg/dL (ref 8.9–10.3)
CHLORIDE: 98 mmol/L — AB (ref 101–111)
CO2: 25 mmol/L (ref 22–32)
CREATININE: 0.51 mg/dL (ref 0.44–1.00)
Glucose, Bld: 191 mg/dL — ABNORMAL HIGH (ref 65–99)
Potassium: 4.2 mmol/L (ref 3.5–5.1)
SODIUM: 131 mmol/L — AB (ref 135–145)
Total Bilirubin: 0.5 mg/dL (ref 0.3–1.2)
Total Protein: 6.8 g/dL (ref 6.5–8.1)

## 2017-09-12 LAB — CBC WITH DIFFERENTIAL/PLATELET
BASOS PCT: 1 %
Basophils Absolute: 0.1 10*3/uL (ref 0–0.1)
EOS ABS: 0.2 10*3/uL (ref 0–0.7)
Eosinophils Relative: 3 %
HEMATOCRIT: 28.2 % — AB (ref 35.0–47.0)
HEMOGLOBIN: 9.8 g/dL — AB (ref 12.0–16.0)
Lymphocytes Relative: 9 %
Lymphs Abs: 0.6 10*3/uL — ABNORMAL LOW (ref 1.0–3.6)
MCH: 33 pg (ref 26.0–34.0)
MCHC: 34.8 g/dL (ref 32.0–36.0)
MCV: 94.9 fL (ref 80.0–100.0)
MONOS PCT: 13 %
Monocytes Absolute: 0.9 10*3/uL (ref 0.2–0.9)
NEUTROS ABS: 4.7 10*3/uL (ref 1.4–6.5)
NEUTROS PCT: 74 %
Platelets: 337 10*3/uL (ref 150–440)
RBC: 2.97 MIL/uL — AB (ref 3.80–5.20)
RDW: 17.8 % — ABNORMAL HIGH (ref 11.5–14.5)
WBC: 6.5 10*3/uL (ref 3.6–11.0)

## 2017-09-12 MED ORDER — SODIUM CHLORIDE 0.9% FLUSH
10.0000 mL | Freq: Once | INTRAVENOUS | Status: AC
  Administered 2017-09-12: 10 mL via INTRAVENOUS
  Filled 2017-09-12: qty 10

## 2017-09-12 MED ORDER — FAMOTIDINE IN NACL 20-0.9 MG/50ML-% IV SOLN
20.0000 mg | Freq: Once | INTRAVENOUS | Status: AC
  Administered 2017-09-12: 20 mg via INTRAVENOUS
  Filled 2017-09-12: qty 50

## 2017-09-12 MED ORDER — SODIUM CHLORIDE 0.9 % IV SOLN
20.0000 mg | Freq: Once | INTRAVENOUS | Status: AC
  Administered 2017-09-12: 20 mg via INTRAVENOUS
  Filled 2017-09-12: qty 2

## 2017-09-12 MED ORDER — DIPHENHYDRAMINE HCL 50 MG/ML IJ SOLN
25.0000 mg | Freq: Once | INTRAMUSCULAR | Status: AC
  Administered 2017-09-12: 25 mg via INTRAVENOUS
  Filled 2017-09-12: qty 1

## 2017-09-12 MED ORDER — SODIUM CHLORIDE 0.9 % IV SOLN
Freq: Once | INTRAVENOUS | Status: AC
Start: 2017-09-12 — End: 2017-09-12
  Administered 2017-09-12: 09:00:00 via INTRAVENOUS
  Filled 2017-09-12: qty 1000

## 2017-09-12 MED ORDER — HEPARIN SOD (PORK) LOCK FLUSH 100 UNIT/ML IV SOLN
500.0000 [IU] | Freq: Once | INTRAVENOUS | Status: AC
  Administered 2017-09-12: 500 [IU] via INTRAVENOUS
  Filled 2017-09-12: qty 5

## 2017-09-12 MED ORDER — PACLITAXEL CHEMO INJECTION 300 MG/50ML
80.0000 mg/m2 | Freq: Once | INTRAVENOUS | Status: AC
  Administered 2017-09-12: 150 mg via INTRAVENOUS
  Filled 2017-09-12: qty 25

## 2017-09-18 NOTE — Progress Notes (Signed)
Dominique Guzman.  Patient Care Team: Glendon Axe, MD as PCP - General (Internal Medicine)  Reason for Guzman Follow up for treatment of breast cancer.   Pertinent Oncology History Dominique Guzman 66 y.o. female with PMH listed as below is referred by Dr.Schermerhorn to [name] for evaluation and management of newly diagnosed breast cancer. Patient had a history a left breast mass biopsy followed by breast excisonal biopsy in 2015 with benign pathology.  She also has a history of Uterine cancer which was treated with RT with Dr.Chrystal.  She underwent routine screening mammogram on 04/17/2017 which recommended a possible mass on the left breast. Left diagnostic mammogram showed a highly suspicious left breast mass at 4 o'clock, 7 cm from the nipple and 1 o'clock,,  About 3 cm from the nipple. Enlarged left axillary lymph node suspicious for metastatic disease.  1 US breast confirmed a left breast mass of 2.1cm, 4 o'clock, and 1.1 cm mass at 1 o'clock.The masses at 1 and 4 o'clock are separated by approximately 4 cm sonographically 2 Biopsy of the two mass and axillary lymph node revealed invasive carcinoma, grade 2. Both are ER,PR positive. HER2 equivocal, FISH pending. 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  3 05/14/2017 MRI breast showed Two sites of biopsy proven malignancy seen in the left breast, which span up to 11.3 cm. There are intervening areas of non mass enhancement and a small suspicious enhancing mass. 4 Case was discussed on breast tumor board on 05/21/2017 and the panel agree with neoadjuvant ddAC to T followed by surgery.  5 s/p 4 cycles ddAC, Tolerates well except mucositis which improved with magic mouth wash and a course of Valtrex.  currently on weekly Taxol.     INTERVAL HISTORY She comes for evaluation before Cycle 10 weekly Taxol.  she is accompanied by her husband.  She reports doing well. She still has "sensitivities" of her  bilateral thumb, index and middle finger tips. Denies numbness or tingling.  Denies nausea or vomiting, fever or chill, mouth sores. .   Review of Systems  Constitutional: Negative for appetite change and chills.  HENT:   Negative for lump/mass.   Eyes: Negative for eye problems.  Respiratory: Negative for chest tightness.   Cardiovascular: Negative for chest pain.  Gastrointestinal: Negative for abdominal distention and abdominal pain.  Endocrine: Negative for hot flashes.  Genitourinary: Negative for difficulty urinating.   Musculoskeletal: Negative for arthralgias.  Skin: Negative for itching.  Neurological: Negative for dizziness and numbness.       Finger tip sensitivity  Hematological: Negative for adenopathy.  Psychiatric/Behavioral: The patient is not nervous/anxious.    [name]   MEDICAL HISTORY: Past Medical History:  Diagnosis Date  . Anemia   . Arthritis    HIP-LEFT  . Benign neoplasm of colon   . Breast cancer ([place])   . Cancer ([place])   . Diabetes mellitus without complication ([place])   . H/O osteopenia   . Hypertension   . Sleep apnea    USE CPAP  . Uterine cancer [facility])     SURGICAL HISTORY: Past Surgical History:  Procedure Laterality Date  . ABDOMINAL HYSTERECTOMY    . BREAST BIOPSY Left 04/2014   negative  . BREAST EXCISIONAL BIOPSY Left 2015  . CESAREAN SECTION     X2  . COLONOSCOPY WITH PROPOFOL N/A 04/15/2015   Procedure: COLONOSCOPY WITH PROPOFOL;  Surgeon: Hulen Luster, MD;  Location: [facility] ENDOSCOPY;  Service: Gastroenterology;  Laterality: N/A;  . HERNIA REPAIR    . PORTACATH PLACEMENT Right 05/22/2017   Procedure: INSERTION PORT-A-CATH;  Surgeon: Leonie Green, MD;  Location: [facility] ORS;  Service: General;  Laterality: Right;    SOCIAL HISTORY: Social History   Socioeconomic History  . Marital status: Married    Spouse name: Not on file  . Number of children: Not on file  . Years of education: Not on file  . Highest education level: Not on  file  Social Needs  . Financial resource strain: Not on file  . Food insecurity - worry: Not on file  . Food insecurity - inability: Not on file  . Transportation needs - medical: Not on file  . Transportation needs - non-medical: Not on file  Occupational History  . Not on file  Tobacco Use  . Smoking status: Never Smoker  . Smokeless tobacco: Never Used  Substance and Sexual Activity  . Alcohol use: No  . Drug use: No  . Sexual activity: Not on file  Other Topics Concern  . Not on file  Social History Narrative  . Not on file    FAMILY HISTORY Family History  Problem Relation Age of Onset  . Diabetes Mother   . Diabetes Father   . Diabetes Sister   . Breast cancer Neg Hx     ALLERGIES:  has No Known Allergies.  MEDICATIONS:  Current Outpatient Medications  Medication Sig Dispense Refill  . Calcium Carb-Cholecalciferol (CALCIUM 600-D PO) Take 1 tablet by mouth daily.    . chlorhexidine (PERIDEX) 0.12 % solution USE AS DIRECTED 15 ML IN THE MOUTH OR THROAT TWICE A DAY 473 mL 0  . CINNAMON PO Take 2 capsules by mouth daily.    . diphenhydrAMINE (BENADRYL) 25 MG tablet Take 25 mg by mouth as needed for allergies.    [name] FIBER PO Take 1 capsule by mouth daily.    . fluticasone (FLONASE) 50 MCG/ACT nasal spray Place 2 sprays into both nostrils as needed.     . Glucosamine HCl (GLUCOSAMINE PO) Take 1 tablet by mouth daily.    [name] lidocaine-prilocaine (EMLA) cream Apply 1 application topically as needed. Apply small amount to port site at least 1 hour prior to it being accessed, cover with plastic wrap 30 g 3  . lisinopril-hydrochlorothiazide (PRINZIDE,ZESTORETIC) 20-12.5 MG per tablet Take 1 tablet by mouth daily.    [name] loperamide (IMODIUM) 2 MG capsule Take 1 capsule (2 mg total) by mouth See admin instructions. With onset of loose stool, take 81m followed by 272mevery 2 hours until 12 hours have passed without loose bowel movement. Maximum: 16 mg/day 120 capsule 1  . magic  mouthwash SOLN SWISH AND SPIT 5 TO 10 MLS 4 TIMES A DAY AS NEEDED  3  . metFORMIN (GLUCOPHAGE) 500 MG tablet Take 1,000 mg by mouth 2 (two) times daily.     . Multiple Vitamin (MULTIVITAMIN) tablet Take 1 tablet by mouth daily.    . ondansetron (ZOFRAN) 8 MG tablet Take 1 tablet (8 mg total) by mouth every 8 (eight) hours as needed for nausea or vomiting. 120 tablet 0  . sitaGLIPtin (JANUVIA) 100 MG tablet Take 100 mg by mouth daily.    . valACYclovir (VALTREX) 1000 MG tablet Take 1 tablet (1,000 mg total) by mouth 2 (two) times daily. 14 tablet 0   No current facility-administered medications for this Guzman.     PHYSICAL EXAMINATION:  ECOG PERFORMANCE STATUS: 1 -  Symptomatic but completely ambulatory  Vitals:   09/19/17 0852  BP: 116/75  Pulse: (!) 103  Temp: 99.5 F (37.5 C)    Filed Weights   09/19/17 0852  Weight: 191 lb 8 oz (86.9 kg)     Physical Exam  Constitutional: She is oriented to person, place, and time and well-developed, well-nourished, and in no distress. No distress.  HENT:  Head: Normocephalic and atraumatic.  Mouth/Throat: No oropharyngeal exudate.  Eyes: Conjunctivae and EOM are normal. Pupils are equal, round, and reactive to light. Right eye exhibits no discharge. No scleral icterus.  Neck: Normal range of motion. Neck supple. No JVD present.  Cardiovascular: Normal rate, regular rhythm and normal heart sounds. Exam reveals no friction rub.  No murmur heard. Pulmonary/Chest: Effort normal and breath sounds normal. No respiratory distress. She has no wheezes. She exhibits no tenderness.  Abdominal: Soft. Bowel sounds are normal. She exhibits no distension. There is no tenderness. There is no rebound.  Musculoskeletal: Normal range of motion. She exhibits no edema.  Lymphadenopathy:    She has no cervical adenopathy.  Neurological: She is alert and oriented to person, place, and time.  Skin: Skin is warm and dry. No rash noted. She is not diaphoretic. No  erythema.  Psychiatric: Affect and judgment normal.  Breast exam was performed in seated and lying down position.  No palpable right breast  masses or lumps . I can barely feel her left breast outer quadrant mass.   LABORATORY DATA: CBC Latest Ref Rng & Units 09/19/2017 09/12/2017 09/05/2017  WBC 3.6 - 11.0 K/uL 6.8 6.5 5.3  Hemoglobin 12.0 - 16.0 g/dL 9.8(L) 9.8(L) 9.5(L)  Hematocrit 35.0 - 47.0 % 28.4(L) 28.2(L) 27.3(L)  Platelets 150 - 440 K/uL 360 337 310   CMP Latest Ref Rng & Units 09/12/2017 09/05/2017 08/29/2017  Glucose 65 - 99 mg/dL 191(H) 173(H) 149(H)  BUN 6 - 20 mg/dL '13 13 12  ' Creatinine 0.44 - 1.00 mg/dL 0.51 0.63 0.67  Sodium 135 - 145 mmol/L 131(L) 131(L) 132(L)  Potassium 3.5 - 5.1 mmol/L 4.2 3.9 4.1  Chloride 101 - 111 mmol/L 98(L) 99(L) 98(L)  CO2 22 - 32 mmol/L '25 23 24  ' Calcium 8.9 - 10.3 mg/dL 9.6 9.6 9.8  Total Protein 6.5 - 8.1 g/dL 6.8 6.5 6.7  Total Bilirubin 0.3 - 1.2 mg/dL 0.5 0.5 0.6  Alkaline Phos 38 - 126 U/L 86 68 82  AST 15 - 41 U/L '23 22 26  ' ALT 14 - 54 U/L '17 17 19    ' Normal  B12 and Folate. Iron panel was checked in September showed no signs of iron deficiency   RADIOGRAPHIC STUDIES: I have personally reviewed the radiological images as listed and agree with the findings in the report 04/25/2017 Mammogram Diagnostic Targeted ultrasound of the left breast demonstrates an irregular, hypoechoic mass at 1 o'clock, 3 cm from the nipple measuring 10 x 10 x 10 mm corresponding to the mass seen within the anterior upper, outer left breast. At 4 o'clock, 7 cm from the nipple, there is an irregular hypoechoic mass measuring 2.1 x 1.5 x 1.9 cm. Targeted ultrasound of the left axilla demonstrates an enlarged, abnormal axillary lymph node demonstrating cortical thickening up to 6 mm. The masses at 1 and 4 o'clock are separated by approximately 4 cm sonographically. IMPRESSION: Highly suspicious left breast masses at 4 o'clock, 7 cm from the nipple  and 1 o'clock, 3 cm from the nipple. Enlarged left axillary lymph node suspicious for  metastatic disease. US breast 04/25/2017 FINDINGS: There is an irregular spiculated mass in the slightly lower, outer left breast, posterior depth measuring approximately 2.6 x 2.5 x 1.9 cm. There is an additional irregular mass within the upper, outerleft breast, anterior depth measuring approximately 1 cm in size, which is suspicious for a satellite lesion. There is asymmetric density between the 2 masses suggesting contiguous spread of disease. Mammographically, the masses are separated by approximately 6 cm and in total span approximately 9.6 cm.  Mammographic images were processed with CAD.  Targeted ultrasound of the left breast demonstrates an irregular, hypoechoic mass at 1 o'clock, 3 cm from the nipple measuring 10 x 10 x 10 mm corresponding to the mass seen within the anterior upper, outer left breast. At 4 o'clock, 7 cm from the nipple, there is an irregular hypoechoic mass measuring 2.1 x 1.5 x 1.9 cm. Targeted ultrasound of the left axilla demonstrates an enlarged, abnormal axillary lymph node demonstrating cortical thickening up to 6 mm. The masses at 1 and 4 o'clock are separated by approximately 4 cm sonographically. IMPRESSION: Highly suspicious left breast masses at 4 o'clock, 7 cm from the nipple and 1 o'clock, 3 cm from the nipple. Enlarged left axillary lymph node suspicious for metastatic disease.  07/11/2017 MRI breast Bilateral FINDINGS: Breast composition: b. Scattered fibroglandular tissue. Background parenchymal enhancement: Minimal.  Right breast: No significant change in several small nodules possibly representing normal intramammary lymph nodes, mammographically stable.  Left breast: The previously demonstrated 2.6 x 2.2 x 2.0 cm enhancing mass in the posterior aspect of the upper-outer quadrant of the left breast demonstrates significantly less enhancement. This is  currently very low-grade with persistent enhancement kinetics. This currently measures 2.6 x 1.6 x 1.6 cm on image number 131 of series 10603 and in the sagittal plane.  The previously demonstrated 1.7 x 1.3 x 0.9 cm anterior enhancing mass containing a biopsy marker clip artifact currently measures 1.4 x 1.0 x 0.6 cm on image number 132 of series 10603 and in the sagittal plane. This also demonstrates some decrease in enhancement with a mixture of enhancement kinetics, including rapid wash-in/ washout.  There are are 2 additional linear areas of non mass enhancement between these areas, currently measuring 3.9 cm in length on axial image number 121 and 3.0 cm in length on axial image number 113 of the same series.  The 1.4 x 0.7 x 0.5 cm third enhancing mass enhancement between the 2 biopsied areas previously is significantly smaller, currently measuring 0.7 x 0.3 x 0.2 cm on image number 99 of the same series and in the sagittal plane.  The residual enhancing areas in the left breast span an area measuring 10.1 cm.  Lymph nodes: No abnormal appearing lymph nodes. The previously demonstrated prominent left axillary lymph nodes are no longer prominent. Ancillary findings:  None.  IMPRESSION: 1. Partial imaging response of the known malignancy in the left breast, as described above, currently spanning 10.1 cm. 2. Complete imaging response of previously demonstrated left axillary adenopathy. 3. No evidence of malignancy on the right.   PATHOLOGY DIAGNOSIS:  A. BREAST, LEFT, 4 O'CLOCK 7 CM FROM NIPPLE; ULTRASOUND-GUIDED CORE BIOPSY:  - INVASIVE MAMMARY CARCINOMA, NO SPECIAL TYPE, GRADE 2, SEE COMMENT.  - INVASIVE CARCINOMA MEASURES 15 MM IN THIS SAMPLE.  B. BREAST, LEFT, 1 O'CLOCK, 3 CM FROM NIPPLE; ULTRASOUND-GUIDED CORE  BIOPSY:  - INVASIVE MAMMARY CARCINOMA, NO SPECIAL TYPE, GRADE 2, SEE COMMENT.  - INVASIVE CARCINOMA MEASURES 10 MM IN  THIS SAMPLE.  C. LYMPH NODE, LEFT  AXILLA; ULTRASOUND-GUIDED CORE BIOPSY:  - METASTATIC CARCINOMA, 7 MM METASTASIS, SEE COMMENT.    BREAST BIOMARKER TEST RESULTS - TUMOR A, 4 O'CLOCK:  Estrogen Receptor (ER) Status: POSITIVE, >90% of cells with nuclear  positivity     Average intensity of staining: Strong  Progesterone Receptor (PR) Status: POSITIVE, >90% of cells with nuclear  positivity     Average intensity of staining: Strong  HER2 (by immunohistochemistry): Equivocal (score 2+)  Percentage of cells with uniform intense complete membrane staining: 1%   BREAST BIOMARKER TEST RESULTS - TUMOR B, 1 O'CLOCK:  Estrogen Receptor (ER) Status: POSITIVE, >90% of cells with nuclear  positivity     Average intensity of staining: Strong  Progesterone Receptor (PR) Status: POSITIVE, 51-90% of cells with  nuclear positivity     Average intensity of staining: Moderate to strong  HER2 (by immunohistochemistry): Equivocal (score 2+)  Percentage of cells with uniform intense complete membrane staining: 2%    ADDENDUM #2   BREAST BIOMARKER TEST: HER2 FISH - TUMOR A, 4 O'CLOCK:  HER2 (ERBB2) (by in situ hybridization): NEGATIVE (not amplified)  Number of observers: 2  Number of invasive tumor cells counted: 40  Dual probe assay  Average number of HER2 signals per cell: 3.6  Average number of CEP17 signals per cell: 2.5  HER2/CEP17 ratio: 1.42   05/14/2017 MRI breast IMPRESSION: 1. Two sites of biopsy proven malignancy seen in the left breast, which span up to 11.3 cm. There are intervening areas of non mass enhancement and a small suspicious enhancing mass. 2. The biopsy-proven metastatic left axillary lymph node is identified. There are 3 other prominent left axillary lymph nodes which are not definitely pathologically enlarged. 3.  No evidence of right breast malignancy    ASSESSMENT/PLAN 66 y.o. female presents with cT3cN1cMo left breast multifocal breast invasive mammary carcinoma, ER+, PR+ HER2  negative by FISH, currently on neoadjuvant chemotherapy, s/p 4 cycles of ddAC, now on weekly taxol.  Cancer Staging Malignant neoplasm of lower-outer quadrant of left breast of female, estrogen receptor positive ([place]) Staging form: Breast, AJCC 8th Edition - Clinical stage from 04/25/2017: Stage IIA (cT3(m), cN1(f), cM0, G2, ER: Positive, PR: Positive, HER2: Equivocal) - Signed by Earlie Server, MD on 05/23/2017  Malignant neoplasm of upper-outer quadrant of left breast in female, estrogen receptor positive ([place]) Staging form: Breast, AJCC 8th Edition - Clinical: No stage assigned - Unsigned    1. Encounter for antineoplastic chemotherapy   2. Malignant neoplasm of upper-outer quadrant of left breast in female, estrogen receptor positive ([place])   3. Malignant neoplasm of lower-outer quadrant of left breast of female, estrogen receptor positive ([place])   4. 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 Chemotherapy-induced neuropathy ([place])    # Multifocal left breast cancer, ER+PR+, HER2-,    Currently on weekly Taxol plan x 12  prior to surgery. Proceed with cycle 9 taxol.  #Grade 1 neuropathy, will reduce Taxol from 70m/m2 to 753mm2 weekly.   Proceed with cycle 11 Taxol in 1 week with CBC, CMP prior to chemotherapy.  Follow up in 2 week for evaluation prior to cycle 12 Taxol. Plan repeat MRI breast, CT chest abdomen pelvis. Plan surgery.   All questions were answered. The patient knows to call the clinic with any problems, questions or concerns.  ZhEarlie ServerMD, PhD Hematology Oncology CoMidwest Endoscopy Services LLCt AlAtlanta Va Health Medical Centerager- [phone]/09/2017

## 2017-09-19 ENCOUNTER — Inpatient Hospital Stay: Payer: BLUE CROSS/BLUE SHIELD

## 2017-09-19 ENCOUNTER — Other Ambulatory Visit: Payer: Self-pay

## 2017-09-19 ENCOUNTER — Encounter: Payer: Self-pay | Admitting: Oncology

## 2017-09-19 ENCOUNTER — Inpatient Hospital Stay (HOSPITAL_BASED_OUTPATIENT_CLINIC_OR_DEPARTMENT_OTHER): Payer: BLUE CROSS/BLUE SHIELD | Admitting: Oncology

## 2017-09-19 VITALS — BP 116/75 | HR 103 | Temp 99.5°F | Wt 191.5 lb

## 2017-09-19 DIAGNOSIS — C773 Secondary and unspecified malignant neoplasm of axilla and upper limb lymph nodes: Secondary | ICD-10-CM | POA: Diagnosis not present

## 2017-09-19 DIAGNOSIS — E860 Dehydration: Secondary | ICD-10-CM

## 2017-09-19 DIAGNOSIS — G4730 Sleep apnea, unspecified: Secondary | ICD-10-CM

## 2017-09-19 DIAGNOSIS — T451X5S Adverse effect of antineoplastic and immunosuppressive drugs, sequela: Secondary | ICD-10-CM

## 2017-09-19 DIAGNOSIS — R590 Localized enlarged lymph nodes: Secondary | ICD-10-CM

## 2017-09-19 DIAGNOSIS — C50412 Malignant neoplasm of upper-outer quadrant of left female breast: Secondary | ICD-10-CM | POA: Diagnosis not present

## 2017-09-19 DIAGNOSIS — E119 Type 2 diabetes mellitus without complications: Secondary | ICD-10-CM | POA: Diagnosis not present

## 2017-09-19 DIAGNOSIS — I10 Essential (primary) hypertension: Secondary | ICD-10-CM | POA: Diagnosis not present

## 2017-09-19 DIAGNOSIS — Z7984 Long term (current) use of oral hypoglycemic drugs: Secondary | ICD-10-CM

## 2017-09-19 DIAGNOSIS — C50512 Malignant neoplasm of lower-outer quadrant of left female breast: Secondary | ICD-10-CM

## 2017-09-19 DIAGNOSIS — Z90710 Acquired absence of both cervix and uterus: Secondary | ICD-10-CM

## 2017-09-19 DIAGNOSIS — Z923 Personal history of irradiation: Secondary | ICD-10-CM

## 2017-09-19 DIAGNOSIS — Z8542 Personal history of malignant neoplasm of other parts of uterus: Secondary | ICD-10-CM

## 2017-09-19 DIAGNOSIS — Z5111 Encounter for antineoplastic chemotherapy: Secondary | ICD-10-CM

## 2017-09-19 DIAGNOSIS — T451X5A Adverse effect of antineoplastic and immunosuppressive drugs, initial encounter: Secondary | ICD-10-CM

## 2017-09-19 DIAGNOSIS — C50912 Malignant neoplasm of unspecified site of left female breast: Secondary | ICD-10-CM

## 2017-09-19 DIAGNOSIS — Z170 Estrogen receptor positive status [ER+]: Secondary | ICD-10-CM | POA: Diagnosis not present

## 2017-09-19 DIAGNOSIS — Z79899 Other long term (current) drug therapy: Secondary | ICD-10-CM

## 2017-09-19 DIAGNOSIS — M8580 Other specified disorders of bone density and structure, unspecified site: Secondary | ICD-10-CM

## 2017-09-19 DIAGNOSIS — M1990 Unspecified osteoarthritis, unspecified site: Secondary | ICD-10-CM

## 2017-09-19 DIAGNOSIS — G620 Drug-induced polyneuropathy: Secondary | ICD-10-CM

## 2017-09-19 DIAGNOSIS — Z9989 Dependence on other enabling machines and devices: Secondary | ICD-10-CM

## 2017-09-19 LAB — COMPREHENSIVE METABOLIC PANEL
ALK PHOS: 70 U/L (ref 38–126)
ALT: 16 U/L (ref 14–54)
AST: 25 U/L (ref 15–41)
Albumin: 3.6 g/dL (ref 3.5–5.0)
Anion gap: 9 (ref 5–15)
BUN: 12 mg/dL (ref 6–20)
CALCIUM: 9.5 mg/dL (ref 8.9–10.3)
CHLORIDE: 96 mmol/L — AB (ref 101–111)
CO2: 24 mmol/L (ref 22–32)
CREATININE: 0.59 mg/dL (ref 0.44–1.00)
Glucose, Bld: 180 mg/dL — ABNORMAL HIGH (ref 65–99)
Potassium: 4 mmol/L (ref 3.5–5.1)
Sodium: 129 mmol/L — ABNORMAL LOW (ref 135–145)
Total Bilirubin: 0.5 mg/dL (ref 0.3–1.2)
Total Protein: 6.8 g/dL (ref 6.5–8.1)

## 2017-09-19 LAB — CBC WITH DIFFERENTIAL/PLATELET
BASOS PCT: 1 %
Basophils Absolute: 0.1 10*3/uL (ref 0–0.1)
EOS ABS: 0.1 10*3/uL (ref 0–0.7)
EOS PCT: 2 %
HCT: 28.4 % — ABNORMAL LOW (ref 35.0–47.0)
HEMOGLOBIN: 9.8 g/dL — AB (ref 12.0–16.0)
LYMPHS ABS: 0.6 10*3/uL — AB (ref 1.0–3.6)
Lymphocytes Relative: 8 %
MCH: 32.3 pg (ref 26.0–34.0)
MCHC: 34.4 g/dL (ref 32.0–36.0)
MCV: 94 fL (ref 80.0–100.0)
MONOS PCT: 15 %
Monocytes Absolute: 1 10*3/uL — ABNORMAL HIGH (ref 0.2–0.9)
NEUTROS PCT: 74 %
Neutro Abs: 5 10*3/uL (ref 1.4–6.5)
PLATELETS: 360 10*3/uL (ref 150–440)
RBC: 3.03 MIL/uL — AB (ref 3.80–5.20)
RDW: 18.1 % — ABNORMAL HIGH (ref 11.5–14.5)
WBC: 6.8 10*3/uL (ref 3.6–11.0)

## 2017-09-19 MED ORDER — HEPARIN SOD (PORK) LOCK FLUSH 100 UNIT/ML IV SOLN
500.0000 [IU] | Freq: Once | INTRAVENOUS | Status: AC
  Administered 2017-09-19: 500 [IU] via INTRAVENOUS
  Filled 2017-09-19: qty 5

## 2017-09-19 MED ORDER — SODIUM CHLORIDE 0.9 % IV SOLN
20.0000 mg | Freq: Once | INTRAVENOUS | Status: AC
  Administered 2017-09-19: 20 mg via INTRAVENOUS
  Filled 2017-09-19: qty 2

## 2017-09-19 MED ORDER — SODIUM CHLORIDE 0.9 % IV SOLN
Freq: Once | INTRAVENOUS | Status: AC
  Administered 2017-09-19: 10:00:00 via INTRAVENOUS
  Filled 2017-09-19: qty 1000

## 2017-09-19 MED ORDER — SODIUM CHLORIDE 0.9 % IV SOLN
70.0000 mg/m2 | Freq: Once | INTRAVENOUS | Status: AC
  Administered 2017-09-19: 132 mg via INTRAVENOUS
  Filled 2017-09-19: qty 22

## 2017-09-19 MED ORDER — SODIUM CHLORIDE 0.9% FLUSH
10.0000 mL | INTRAVENOUS | Status: DC | PRN
  Administered 2017-09-19: 10 mL via INTRAVENOUS
  Filled 2017-09-19: qty 10

## 2017-09-19 MED ORDER — DIPHENHYDRAMINE HCL 50 MG/ML IJ SOLN
25.0000 mg | Freq: Once | INTRAMUSCULAR | Status: AC
  Administered 2017-09-19: 25 mg via INTRAVENOUS
  Filled 2017-09-19: qty 1

## 2017-09-19 MED ORDER — VITAMIN B-6 100 MG PO TABS: 100.0000 mg | ORAL_TABLET | Freq: Every day | ORAL | 2 refills | Status: DC

## 2017-09-19 MED ORDER — FAMOTIDINE IN NACL 20-0.9 MG/50ML-% IV SOLN
20.0000 mg | Freq: Once | INTRAVENOUS | Status: AC
  Administered 2017-09-19: 20 mg via INTRAVENOUS
  Filled 2017-09-19: qty 50

## 2017-09-19 NOTE — Progress Notes (Signed)
Patient c/o cough, that started about 5 days ago

## 2017-09-26 ENCOUNTER — Inpatient Hospital Stay: Payer: BLUE CROSS/BLUE SHIELD

## 2017-09-26 VITALS — BP 111/75 | HR 96 | Resp 20

## 2017-09-26 DIAGNOSIS — Z5111 Encounter for antineoplastic chemotherapy: Secondary | ICD-10-CM

## 2017-09-26 DIAGNOSIS — Z170 Estrogen receptor positive status [ER+]: Secondary | ICD-10-CM

## 2017-09-26 DIAGNOSIS — C50412 Malignant neoplasm of upper-outer quadrant of left female breast: Secondary | ICD-10-CM | POA: Diagnosis not present

## 2017-09-26 DIAGNOSIS — C50512 Malignant neoplasm of lower-outer quadrant of left female breast: Secondary | ICD-10-CM

## 2017-09-26 LAB — COMPREHENSIVE METABOLIC PANEL
ALBUMIN: 3.4 g/dL — AB (ref 3.5–5.0)
ALT: 17 U/L (ref 14–54)
ANION GAP: 9 (ref 5–15)
AST: 27 U/L (ref 15–41)
Alkaline Phosphatase: 82 U/L (ref 38–126)
BUN: 11 mg/dL (ref 6–20)
CO2: 23 mmol/L (ref 22–32)
Calcium: 9.6 mg/dL (ref 8.9–10.3)
Chloride: 100 mmol/L — ABNORMAL LOW (ref 101–111)
Creatinine, Ser: 0.49 mg/dL (ref 0.44–1.00)
GFR calc non Af Amer: >60 mL/min (ref >60)
GLUCOSE: 168 mg/dL — AB (ref 65–99)
POTASSIUM: 4.2 mmol/L (ref 3.5–5.1)
SODIUM: 132 mmol/L — AB (ref 135–145)
TOTAL PROTEIN: 6.4 g/dL — AB (ref 6.5–8.1)
Total Bilirubin: 0.5 mg/dL (ref 0.3–1.2)

## 2017-09-26 LAB — CBC WITH DIFFERENTIAL/PLATELET
Basophils Absolute: 0.1 10*3/uL (ref 0–0.1)
Basophils Relative: 1 %
Eosinophils Absolute: 0.2 10*3/uL (ref 0–0.7)
Eosinophils Relative: 3 %
HEMATOCRIT: 28.5 % — AB (ref 35.0–47.0)
HEMOGLOBIN: 9.9 g/dL — AB (ref 12.0–16.0)
LYMPHS ABS: 0.6 10*3/uL — AB (ref 1.0–3.6)
Lymphocytes Relative: 9 %
MCH: 32.4 pg (ref 26.0–34.0)
MCHC: 34.6 g/dL (ref 32.0–36.0)
MCV: 93.7 fL (ref 80.0–100.0)
MONOS PCT: 17 %
Monocytes Absolute: 1.2 10*3/uL — ABNORMAL HIGH (ref 0.2–0.9)
NEUTROS PCT: 70 %
Neutro Abs: 4.6 10*3/uL (ref 1.4–6.5)
Platelets: 367 10*3/uL (ref 150–440)
RBC: 3.04 MIL/uL — AB (ref 3.80–5.20)
RDW: 18.1 % — ABNORMAL HIGH (ref 11.5–14.5)
WBC: 6.6 10*3/uL (ref 3.6–11.0)

## 2017-09-26 MED ORDER — HEPARIN SOD (PORK) LOCK FLUSH 100 UNIT/ML IV SOLN: 500.0000 [IU] | Freq: Once | INTRAVENOUS | Status: DC

## 2017-09-26 MED ORDER — DEXAMETHASONE SODIUM PHOSPHATE 100 MG/10ML IJ SOLN
20.0000 mg | Freq: Once | INTRAMUSCULAR | Status: AC
  Administered 2017-09-26: 20 mg via INTRAVENOUS
  Filled 2017-09-26: qty 2

## 2017-09-26 MED ORDER — SODIUM CHLORIDE 0.9 % IV SOLN
70.0000 mg/m2 | Freq: Once | INTRAVENOUS | Status: AC
  Administered 2017-09-26: 132 mg via INTRAVENOUS
  Filled 2017-09-26: qty 22

## 2017-09-26 MED ORDER — HEPARIN SOD (PORK) LOCK FLUSH 100 UNIT/ML IV SOLN
500.0000 [IU] | Freq: Once | INTRAVENOUS | Status: AC | PRN
  Administered 2017-09-26: 500 [IU]
  Filled 2017-09-26: qty 5

## 2017-09-26 MED ORDER — FAMOTIDINE IN NACL 20-0.9 MG/50ML-% IV SOLN
20.0000 mg | Freq: Once | INTRAVENOUS | Status: AC
  Administered 2017-09-26: 20 mg via INTRAVENOUS
  Filled 2017-09-26: qty 50

## 2017-09-26 MED ORDER — SODIUM CHLORIDE 0.9 % IV SOLN
Freq: Once | INTRAVENOUS | Status: AC
  Administered 2017-09-26: 10:00:00 via INTRAVENOUS
  Filled 2017-09-26: qty 1000

## 2017-09-26 MED ORDER — SODIUM CHLORIDE 0.9% FLUSH
10.0000 mL | INTRAVENOUS | Status: DC | PRN
  Administered 2017-09-26: 10 mL via INTRAVENOUS
  Filled 2017-09-26: qty 10

## 2017-09-26 MED ORDER — DIPHENHYDRAMINE HCL 50 MG/ML IJ SOLN
25.0000 mg | Freq: Once | INTRAMUSCULAR | Status: AC
  Administered 2017-09-26: 25 mg via INTRAVENOUS
  Filled 2017-09-26: qty 1

## 2017-10-03 ENCOUNTER — Other Ambulatory Visit: Payer: Self-pay

## 2017-10-03 ENCOUNTER — Inpatient Hospital Stay (HOSPITAL_BASED_OUTPATIENT_CLINIC_OR_DEPARTMENT_OTHER): Payer: BLUE CROSS/BLUE SHIELD | Admitting: Oncology

## 2017-10-03 ENCOUNTER — Encounter: Payer: Self-pay | Admitting: Oncology

## 2017-10-03 ENCOUNTER — Telehealth: Payer: Self-pay | Admitting: *Deleted

## 2017-10-03 ENCOUNTER — Inpatient Hospital Stay: Payer: BLUE CROSS/BLUE SHIELD

## 2017-10-03 VITALS — BP 148/81 | HR 97 | Temp 97.3°F | Resp 18 | Wt 195.6 lb

## 2017-10-03 DIAGNOSIS — G629 Polyneuropathy, unspecified: Secondary | ICD-10-CM

## 2017-10-03 DIAGNOSIS — Z170 Estrogen receptor positive status [ER+]: Secondary | ICD-10-CM | POA: Diagnosis not present

## 2017-10-03 DIAGNOSIS — C50412 Malignant neoplasm of upper-outer quadrant of left female breast: Secondary | ICD-10-CM

## 2017-10-03 DIAGNOSIS — E119 Type 2 diabetes mellitus without complications: Secondary | ICD-10-CM | POA: Diagnosis not present

## 2017-10-03 DIAGNOSIS — R5383 Other fatigue: Secondary | ICD-10-CM | POA: Diagnosis not present

## 2017-10-03 DIAGNOSIS — I10 Essential (primary) hypertension: Secondary | ICD-10-CM

## 2017-10-03 DIAGNOSIS — C773 Secondary and unspecified malignant neoplasm of axilla and upper limb lymph nodes: Secondary | ICD-10-CM

## 2017-10-03 DIAGNOSIS — Z5111 Encounter for antineoplastic chemotherapy: Secondary | ICD-10-CM

## 2017-10-03 DIAGNOSIS — C50912 Malignant neoplasm of unspecified site of left female breast: Secondary | ICD-10-CM

## 2017-10-03 DIAGNOSIS — G4730 Sleep apnea, unspecified: Secondary | ICD-10-CM

## 2017-10-03 DIAGNOSIS — T451X5S Adverse effect of antineoplastic and immunosuppressive drugs, sequela: Secondary | ICD-10-CM | POA: Diagnosis not present

## 2017-10-03 DIAGNOSIS — R590 Localized enlarged lymph nodes: Secondary | ICD-10-CM

## 2017-10-03 DIAGNOSIS — Z79899 Other long term (current) drug therapy: Secondary | ICD-10-CM

## 2017-10-03 DIAGNOSIS — C50512 Malignant neoplasm of lower-outer quadrant of left female breast: Secondary | ICD-10-CM | POA: Diagnosis not present

## 2017-10-03 DIAGNOSIS — M8580 Other specified disorders of bone density and structure, unspecified site: Secondary | ICD-10-CM

## 2017-10-03 DIAGNOSIS — G620 Drug-induced polyneuropathy: Secondary | ICD-10-CM | POA: Diagnosis not present

## 2017-10-03 DIAGNOSIS — Z8542 Personal history of malignant neoplasm of other parts of uterus: Secondary | ICD-10-CM

## 2017-10-03 DIAGNOSIS — M1990 Unspecified osteoarthritis, unspecified site: Secondary | ICD-10-CM

## 2017-10-03 DIAGNOSIS — Z923 Personal history of irradiation: Secondary | ICD-10-CM

## 2017-10-03 DIAGNOSIS — Z9989 Dependence on other enabling machines and devices: Secondary | ICD-10-CM

## 2017-10-03 DIAGNOSIS — Z7984 Long term (current) use of oral hypoglycemic drugs: Secondary | ICD-10-CM

## 2017-10-03 DIAGNOSIS — Z90710 Acquired absence of both cervix and uterus: Secondary | ICD-10-CM

## 2017-10-03 LAB — COMPREHENSIVE METABOLIC PANEL
ALBUMIN: 3.4 g/dL — AB (ref 3.5–5.0)
ALK PHOS: 69 U/L (ref 38–126)
ALT: 22 U/L (ref 14–54)
ANION GAP: 6 (ref 5–15)
AST: 26 U/L (ref 15–41)
BILIRUBIN TOTAL: 0.5 mg/dL (ref 0.3–1.2)
BUN: 12 mg/dL (ref 6–20)
CALCIUM: 8.9 mg/dL (ref 8.9–10.3)
CO2: 26 mmol/L (ref 22–32)
Chloride: 102 mmol/L (ref 101–111)
Creatinine, Ser: 0.56 mg/dL (ref 0.44–1.00)
GLUCOSE: 191 mg/dL — AB (ref 65–99)
POTASSIUM: 4.3 mmol/L (ref 3.5–5.1)
Sodium: 134 mmol/L — ABNORMAL LOW (ref 135–145)
TOTAL PROTEIN: 6.3 g/dL — AB (ref 6.5–8.1)

## 2017-10-03 LAB — CBC WITH DIFFERENTIAL/PLATELET
Basophils Absolute: 0.1 10*3/uL (ref 0–0.1)
Basophils Relative: 1 %
Eosinophils Absolute: 0.2 10*3/uL (ref 0–0.7)
Eosinophils Relative: 4 %
HCT: 27.6 % — ABNORMAL LOW (ref 35.0–47.0)
HEMOGLOBIN: 9.5 g/dL — AB (ref 12.0–16.0)
LYMPHS PCT: 9 %
Lymphs Abs: 0.6 10*3/uL — ABNORMAL LOW (ref 1.0–3.6)
MCH: 32.2 pg (ref 26.0–34.0)
MCHC: 34.3 g/dL (ref 32.0–36.0)
MCV: 93.9 fL (ref 80.0–100.0)
MONO ABS: 1 10*3/uL — AB (ref 0.2–0.9)
Monocytes Relative: 15 %
NEUTROS ABS: 4.5 10*3/uL (ref 1.4–6.5)
NEUTROS PCT: 71 %
PLATELETS: 330 10*3/uL (ref 150–440)
RBC: 2.94 MIL/uL — ABNORMAL LOW (ref 3.80–5.20)
RDW: 18.4 % — ABNORMAL HIGH (ref 11.5–14.5)
WBC: 6.3 10*3/uL (ref 3.6–11.0)

## 2017-10-03 MED ORDER — FAMOTIDINE IN NACL 20-0.9 MG/50ML-% IV SOLN
20.0000 mg | Freq: Once | INTRAVENOUS | Status: AC
  Administered 2017-10-03: 20 mg via INTRAVENOUS
  Filled 2017-10-03: qty 50

## 2017-10-03 MED ORDER — HEPARIN SOD (PORK) LOCK FLUSH 100 UNIT/ML IV SOLN
500.0000 [IU] | Freq: Once | INTRAVENOUS | Status: AC
  Administered 2017-10-03: 500 [IU] via INTRAVENOUS
  Filled 2017-10-03: qty 5

## 2017-10-03 MED ORDER — SODIUM CHLORIDE 0.9 % IV SOLN
Freq: Once | INTRAVENOUS | Status: AC
  Administered 2017-10-03: 09:00:00 via INTRAVENOUS
  Filled 2017-10-03: qty 1000

## 2017-10-03 MED ORDER — SODIUM CHLORIDE 0.9% FLUSH
10.0000 mL | Freq: Once | INTRAVENOUS | Status: AC
  Administered 2017-10-03: 10 mL via INTRAVENOUS
  Filled 2017-10-03: qty 10

## 2017-10-03 MED ORDER — SODIUM CHLORIDE 0.9 % IV SOLN
70.0000 mg/m2 | Freq: Once | INTRAVENOUS | Status: AC
  Administered 2017-10-03: 132 mg via INTRAVENOUS
  Filled 2017-10-03: qty 22

## 2017-10-03 MED ORDER — SODIUM CHLORIDE 0.9 % IV SOLN
20.0000 mg | Freq: Once | INTRAVENOUS | Status: AC
  Administered 2017-10-03: 20 mg via INTRAVENOUS
  Filled 2017-10-03: qty 2

## 2017-10-03 MED ORDER — DIPHENHYDRAMINE HCL 50 MG/ML IJ SOLN
25.0000 mg | Freq: Once | INTRAMUSCULAR | Status: AC
Start: 2017-10-03 — End: 2017-10-03
  Administered 2017-10-03: 25 mg via INTRAVENOUS
  Filled 2017-10-03: qty 1

## 2017-10-03 NOTE — Progress Notes (Signed)
Here for follow up

## 2017-10-03 NOTE — Telephone Encounter (Signed)
Called patient and made her aware of her upcoming appt for her IR Dye Study Scheduled for 10/04/17 patient is aware of time and location. All other appt were  Scheduled as requested. Upcoming appt schedule will be mailed out to her.

## 2017-10-03 NOTE — Progress Notes (Addendum)
[facility] Follow Up Visit.  Patient Care Team: Glendon Axe, MD as PCP - General (Internal Medicine)  Reason for visit Follow up for treatment of breast cancer.   Pertinent Oncology History Dominique Guzman 66 y.o. female with PMH listed as below is referred by Dr.Schermerhorn to [name] for evaluation and management of newly diagnosed breast cancer. Patient had a history a left breast mass biopsy followed by breast excisonal biopsy in 2015 with benign pathology.  She also has a history of Uterine cancer which was treated with RT with Dr.Chrystal.  She underwent routine screening mammogram on 04/17/2017 which recommended a possible mass on the left breast. Left diagnostic mammogram showed a highly suspicious left breast mass at 4 o'clock, 7 cm from the nipple and 1 o'clock,,  About 3 cm from the nipple. Enlarged left axillary lymph node suspicious for metastatic disease.  1 US breast confirmed a left breast mass of 2.1cm, 4 o'clock, and 1.1 cm mass at 1 o'clock.The masses at 1 and 4 o'clock are separated by approximately 4 cm sonographically 2 Biopsy of the two mass and axillary lymph node revealed invasive carcinoma, grade 2. Both are ER,PR positive. HER2 equivocal, FISH pending. 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  3 05/14/2017 MRI breast showed Two sites of biopsy proven malignancy seen in the left breast, which span up to 11.3 cm. There are intervening areas of non mass enhancement and a small suspicious enhancing mass. 4 Case was discussed on breast tumor board on 05/21/2017 and the panel agree with neoadjuvant ddAC to T followed by surgery.  5 s/p 4 cycles ddAC, Tolerates well except mucositis which improved with magic mouth wash and a course of Valtrex.  currently on weekly Taxol.     INTERVAL HISTORY She comes for evaluation before Cycle 12 weekly Taxol.  she is accompanied by her husband.  Reports feeling tired. She lost heat on Christmas day and her  husband accidentally broke his rib cage. Otherwise reports doing fine. Denies nausea vomiting, fever or chills, denies any mouth sores. Denies numbness or tingling.    Review of Systems  Constitutional: Positive for fatigue. Negative for appetite change and chills.  HENT:   Negative for lump/mass.   Eyes: Negative for eye problems.  Respiratory: Negative for chest tightness and hemoptysis.   Cardiovascular: Negative for chest pain and leg swelling.  Gastrointestinal: Negative for abdominal distention, abdominal pain and blood in stool.  Endocrine: Negative for hot flashes.  Genitourinary: Negative for difficulty urinating and dysuria.   Musculoskeletal: Negative for arthralgias, back pain and gait problem.  Skin: Negative for itching and rash.  Neurological: Negative for dizziness, gait problem and numbness.       Finger tip sensitivity  Hematological: Negative for adenopathy.  Psychiatric/Behavioral: Negative for confusion. The patient is not nervous/anxious.    [name]   MEDICAL HISTORY: Past Medical History:  Diagnosis Date  . Anemia   . Arthritis    HIP-LEFT  . Benign neoplasm of colon   . Breast cancer ([place])   . Cancer ([place])   . Diabetes mellitus without complication ([place])   . H/O osteopenia   . Hypertension   . Sleep apnea    USE CPAP  . Uterine cancer [facility]     SURGICAL HISTORY: Past Surgical History:  Procedure Laterality Date  . ABDOMINAL HYSTERECTOMY    . BREAST BIOPSY Left 04/2014   negative  . BREAST EXCISIONAL BIOPSY Left 2015  . CESAREAN SECTION  X2  . COLONOSCOPY WITH PROPOFOL N/A 04/15/2015   Procedure: COLONOSCOPY WITH PROPOFOL;  Surgeon: Hulen Luster, MD;  Location: [facility] ENDOSCOPY;  Service: Gastroenterology;  Laterality: N/A;  . HERNIA REPAIR    . PORTACATH PLACEMENT Right 05/22/2017   Procedure: INSERTION PORT-A-CATH;  Surgeon: Leonie Green, MD;  Location: [facility] ORS;  Service: General;  Laterality: Right;    SOCIAL HISTORY: Social History    Socioeconomic History  . Marital status: Married    Spouse name: Not on file  . Number of children: Not on file  . Years of education: Not on file  . Highest education level: Not on file  Social Needs  . Financial resource strain: Not on file  . Food insecurity - worry: Not on file  . Food insecurity - inability: Not on file  . Transportation needs - medical: Not on file  . Transportation needs - non-medical: Not on file  Occupational History  . Not on file  Tobacco Use  . Smoking status: Never Smoker  . Smokeless tobacco: Never Used  Substance and Sexual Activity  . Alcohol use: No  . Drug use: No  . Sexual activity: Not on file  Other Topics Concern  . Not on file  Social History Narrative  . Not on file    FAMILY HISTORY Family History  Problem Relation Age of Onset  . Diabetes Mother   . Diabetes Father   . Diabetes Sister   . Breast cancer Neg Hx     ALLERGIES:  has No Known Allergies.  MEDICATIONS:  Current Outpatient Medications  Medication Sig Dispense Refill  . Calcium Carb-Cholecalciferol (CALCIUM 600-D PO) Take 1 tablet by mouth daily.    . chlorhexidine (PERIDEX) 0.12 % solution USE AS DIRECTED 15 ML IN THE MOUTH OR THROAT TWICE A DAY 473 mL 0  . CINNAMON PO Take 2 capsules by mouth daily.    . cyanocobalamin 1000 MCG tablet Take 1,000 mcg by mouth daily.    [name] FIBER PO Take 1 capsule by mouth daily.    . fluticasone (FLONASE) 50 MCG/ACT nasal spray Place 2 sprays into both nostrils as needed.     . Glucosamine HCl (GLUCOSAMINE PO) Take 1 tablet by mouth daily.    [name] lidocaine-prilocaine (EMLA) cream Apply 1 application topically as needed. Apply small amount to port site at least 1 hour prior to it being accessed, cover with plastic wrap 30 g 3  . lisinopril-hydrochlorothiazide (PRINZIDE,ZESTORETIC) 20-12.5 MG per tablet Take 1 tablet by mouth daily.    . metFORMIN (GLUCOPHAGE) 500 MG tablet Take 1,000 mg by mouth 2 (two) times daily.     . Multiple  Vitamin (MULTIVITAMIN) tablet Take 1 tablet by mouth daily.    [name] pyridOXINE (VITAMIN B-6) 100 MG tablet Take 1 tablet (100 mg total) by mouth daily. 30 tablet 2  . sitaGLIPtin (JANUVIA) 100 MG tablet Take 100 mg by mouth daily.    . valACYclovir (VALTREX) 1000 MG tablet Take 1 tablet (1,000 mg total) by mouth 2 (two) times daily. 14 tablet 0  . diphenhydrAMINE (BENADRYL) 25 MG tablet Take 25 mg by mouth as needed for allergies.    [name] loperamide (IMODIUM) 2 MG capsule Take 1 capsule (2 mg total) by mouth See admin instructions. With onset of loose stool, take 71m followed by 256mevery 2 hours until 12 hours have passed without loose bowel movement. Maximum: 16 mg/day (Patient not taking: Reported on 10/03/2017) 120 capsule 1  . magic mouthwash  SOLN SWISH AND SPIT 5 TO 10 MLS 4 TIMES A DAY AS NEEDED  3  . ondansetron (ZOFRAN) 8 MG tablet Take 1 tablet (8 mg total) by mouth every 8 (eight) hours as needed for nausea or vomiting. (Patient not taking: Reported on 10/03/2017) 120 tablet 0   No current facility-administered medications for this visit.    Facility-Administered Medications Ordered in Other Visits  Medication Dose Route Frequency Provider Last Rate Last Dose  . dexamethasone (DECADRON) 20 mg in sodium chloride 0.9 % 50 mL IVPB  20 mg Intravenous Once Earlie Server, MD      . famotidine (PEPCID) IVPB 20 mg premix  20 mg Intravenous Once Earlie Server, MD   20 mg at 10/03/17 7672  . heparin lock flush 100 unit/mL  500 Units Intravenous Once Earlie Server, MD      . PACLitaxel (TAXOL) 132 mg in sodium chloride 0.9 % 250 mL chemo infusion (</= 28m/m2)  70 mg/m2 (Treatment Plan Recorded) Intravenous Once YEarlie Server MD        PHYSICAL EXAMINATION:  ECOG PERFORMANCE STATUS: 1 - Symptomatic but completely ambulatory  Vitals:   10/03/17 0851  BP: (!) 148/81  Pulse: 97  Resp: 18  Temp: (!) 97.3 F (36.3 C)    Filed Weights   10/03/17 0851  Weight: 195 lb 9.6 oz (88.7 kg)     Physical Exam   Constitutional: She is oriented to person, place, and time and well-developed, well-nourished, and in no distress. No distress.  HENT:  Head: Normocephalic and atraumatic.  Mouth/Throat: No oropharyngeal exudate.  Eyes: Conjunctivae and EOM are normal. Pupils are equal, round, and reactive to light. Right eye exhibits no discharge. No scleral icterus.  Neck: Normal range of motion. Neck supple. No JVD present.  Cardiovascular: Normal rate, regular rhythm and normal heart sounds. Exam reveals no friction rub.  No murmur heard. Pulmonary/Chest: Effort normal and breath sounds normal. No respiratory distress. She has no wheezes. She exhibits no tenderness.  Abdominal: Soft. Bowel sounds are normal. She exhibits no distension. There is no tenderness. There is no rebound.  Musculoskeletal: Normal range of motion. She exhibits no edema.  Lymphadenopathy:    She has no cervical adenopathy.  Neurological: She is alert and oriented to person, place, and time.  Skin: Skin is warm and dry. No rash noted. She is not diaphoretic. No erythema.  Psychiatric: Affect and judgment normal.  Breast exam was performed in seated and lying down position.  No palpable right breast  masses or lumps . I can barely feel her left breast outer quadrant mass. ECOG 1  LABORATORY DATA: CBC Latest Ref Rng & Units 10/03/2017 09/26/2017 09/19/2017  WBC 3.6 - 11.0 K/uL 6.3 6.6 6.8  Hemoglobin 12.0 - 16.0 g/dL 9.5(L) 9.9(L) 9.8(L)  Hematocrit 35.0 - 47.0 % 27.6(L) 28.5(L) 28.4(L)  Platelets 150 - 440 K/uL 330 367 360   CMP Latest Ref Rng & Units 10/03/2017 09/26/2017 09/19/2017  Glucose 65 - 99 mg/dL 191(H) 168(H) 180(H)  BUN 6 - 20 mg/dL '12 11 12  ' Creatinine 0.44 - 1.00 mg/dL 0.56 0.49 0.59  Sodium 135 - 145 mmol/L 134(L) 132(L) 129(L)  Potassium 3.5 - 5.1 mmol/L 4.3 4.2 4.0  Chloride 101 - 111 mmol/L 102 100(L) 96(L)  CO2 22 - 32 mmol/L '26 23 24  ' Calcium 8.9 - 10.3 mg/dL 8.9 9.6 9.5  Total Protein 6.5 - 8.1 g/dL  6.3(L) 6.4(L) 6.8  Total Bilirubin 0.3 - 1.2 mg/dL 0.5 0.5 0.5  Alkaline Phos 38 - 126 U/L 69 82 70  AST 15 - 41 U/L '26 27 25  ' ALT 14 - 54 U/L '22 17 16    ' Normal  B12 and Folate. Iron panel was checked in September showed no signs of iron deficiency   RADIOGRAPHIC STUDIES: I have personally reviewed the radiological images as listed and agree with the findings in the report 04/25/2017 Mammogram Diagnostic Targeted ultrasound of the left breast demonstrates an irregular, hypoechoic mass at 1 o'clock, 3 cm from the nipple measuring 10 x 10 x 10 mm corresponding to the mass seen within the anterior upper, outer left breast. At 4 o'clock, 7 cm from the nipple, there is an irregular hypoechoic mass measuring 2.1 x 1.5 x 1.9 cm. Targeted ultrasound of the left axilla demonstrates an enlarged, abnormal axillary lymph node demonstrating cortical thickening up to 6 mm. The masses at 1 and 4 o'clock are separated by approximately 4 cm sonographically. IMPRESSION: Highly suspicious left breast masses at 4 o'clock, 7 cm from the nipple and 1 o'clock, 3 cm from the nipple. Enlarged left axillary lymph node suspicious for metastatic disease. US breast 04/25/2017 FINDINGS: There is an irregular spiculated mass in the slightly lower, outer left breast, posterior depth measuring approximately 2.6 x 2.5 x 1.9 cm. There is an additional irregular mass within the upper, outerleft breast, anterior depth measuring approximately 1 cm in size, which is suspicious for a satellite lesion. There is asymmetric density between the 2 masses suggesting contiguous spread of disease. Mammographically, the masses are separated by approximately 6 cm and in total span approximately 9.6 cm.  Mammographic images were processed with CAD.  Targeted ultrasound of the left breast demonstrates an irregular, hypoechoic mass at 1 o'clock, 3 cm from the nipple measuring 10 x 10 x 10 mm corresponding to the mass seen within  the anterior upper, outer left breast. At 4 o'clock, 7 cm from the nipple, there is an irregular hypoechoic mass measuring 2.1 x 1.5 x 1.9 cm. Targeted ultrasound of the left axilla demonstrates an enlarged, abnormal axillary lymph node demonstrating cortical thickening up to 6 mm. The masses at 1 and 4 o'clock are separated by approximately 4 cm sonographically. IMPRESSION: Highly suspicious left breast masses at 4 o'clock, 7 cm from the nipple and 1 o'clock, 3 cm from the nipple. Enlarged left axillary lymph node suspicious for metastatic disease.  07/11/2017 MRI breast Bilateral FINDINGS: Breast composition: b. Scattered fibroglandular tissue. Background parenchymal enhancement: Minimal.  Right breast: No significant change in several small nodules possibly representing normal intramammary lymph nodes, mammographically stable.  Left breast: The previously demonstrated 2.6 x 2.2 x 2.0 cm enhancing mass in the posterior aspect of the upper-outer quadrant of the left breast demonstrates significantly less enhancement. This is currently very low-grade with persistent enhancement kinetics. This currently measures 2.6 x 1.6 x 1.6 cm on image number 131 of series 10603 and in the sagittal plane.  The previously demonstrated 1.7 x 1.3 x 0.9 cm anterior enhancing mass containing a biopsy marker clip artifact currently measures 1.4 x 1.0 x 0.6 cm on image number 132 of series 10603 and in the sagittal plane. This also demonstrates some decrease in enhancement with a mixture of enhancement kinetics, including rapid wash-in/ washout.  There are are 2 additional linear areas of non mass enhancement between these areas, currently measuring 3.9 cm in length on axial image number 121 and 3.0 cm in length on axial image number 113 of the same series.  The 1.4 x 0.7 x 0.5 cm third enhancing mass enhancement between the 2 biopsied areas previously is significantly smaller, currently measuring 0.7  x 0.3 x 0.2 cm on image number 99 of the same series and in the sagittal plane.  The residual enhancing areas in the left breast span an area measuring 10.1 cm.  Lymph nodes: No abnormal appearing lymph nodes. The previously demonstrated prominent left axillary lymph nodes are no longer prominent. Ancillary findings:  None.  IMPRESSION: 1. Partial imaging response of the known malignancy in the left breast, as described above, currently spanning 10.1 cm. 2. Complete imaging response of previously demonstrated left axillary adenopathy. 3. No evidence of malignancy on the right.   PATHOLOGY DIAGNOSIS:  A. BREAST, LEFT, 4 O'CLOCK 7 CM FROM NIPPLE; ULTRASOUND-GUIDED CORE BIOPSY:  - INVASIVE MAMMARY CARCINOMA, NO SPECIAL TYPE, GRADE 2, SEE COMMENT.  - INVASIVE CARCINOMA MEASURES 15 MM IN THIS SAMPLE.  B. BREAST, LEFT, 1 O'CLOCK, 3 CM FROM NIPPLE; ULTRASOUND-GUIDED CORE  BIOPSY:  - INVASIVE MAMMARY CARCINOMA, NO SPECIAL TYPE, GRADE 2, SEE COMMENT.  - INVASIVE CARCINOMA MEASURES 10 MM IN THIS SAMPLE.  C. LYMPH NODE, LEFT AXILLA; ULTRASOUND-GUIDED CORE BIOPSY:  - METASTATIC CARCINOMA, 7 MM METASTASIS, SEE COMMENT.    BREAST BIOMARKER TEST RESULTS - TUMOR A, 4 O'CLOCK:  Estrogen Receptor (ER) Status: POSITIVE, >90% of cells with nuclear  positivity     Average intensity of staining: Strong  Progesterone Receptor (PR) Status: POSITIVE, >90% of cells with nuclear  positivity     Average intensity of staining: Strong  HER2 (by immunohistochemistry): Equivocal (score 2+)  Percentage of cells with uniform intense complete membrane staining: 1%   BREAST BIOMARKER TEST RESULTS - TUMOR B, 1 O'CLOCK:  Estrogen Receptor (ER) Status: POSITIVE, >90% of cells with nuclear  positivity     Average intensity of staining: Strong  Progesterone Receptor (PR) Status: POSITIVE, 51-90% of cells with  nuclear positivity     Average intensity of staining: Moderate to strong  HER2 (by  immunohistochemistry): Equivocal (score 2+)  Percentage of cells with uniform intense complete membrane staining: 2%    ADDENDUM #2   BREAST BIOMARKER TEST: HER2 FISH - TUMOR A, 4 O'CLOCK:  HER2 (ERBB2) (by in situ hybridization): NEGATIVE (not amplified)  Number of observers: 2  Number of invasive tumor cells counted: 40  Dual probe assay  Average number of HER2 signals per cell: 3.6  Average number of CEP17 signals per cell: 2.5  HER2/CEP17 ratio: 1.42   05/14/2017 MRI breast IMPRESSION: 1. Two sites of biopsy proven malignancy seen in the left breast, which span up to 11.3 cm. There are intervening areas of non mass enhancement and a small suspicious enhancing mass. 2. The biopsy-proven metastatic left axillary lymph node is identified. There are 3 other prominent left axillary lymph nodes which are not definitely pathologically enlarged. 3.  No evidence of right breast malignancy    ASSESSMENT/PLAN 66 y.o. 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  Cancer Staging Malignant neoplasm of lower-outer quadrant of left breast of female, estrogen receptor positive ([place]) Staging form: Breast, AJCC 8th Edition - Clinical stage from 04/25/2017: Stage IIA (cT3(m), cN1(f), cM0, G2, ER: Positive, PR: Positive, HER2: Equivocal) - Signed by Earlie Server, MD on 05/23/2017  Malignant neoplasm of upper-outer quadrant of left breast in female, estrogen receptor positive ([place]) Staging  form: Breast, AJCC 8th Edition - Clinical: No stage assigned - Unsigned    1. Encounter for antineoplastic chemotherapy   2. Malignant neoplasm of upper-outer quadrant of left breast in female, estrogen receptor positive ([place])   3. Malignant neoplasm of lower-outer quadrant of left breast of female, estrogen receptor positive ([place])   4. Breast cancer  metastasized to axillary lymph node, left ([place])   5. Neuropathy    # Multifocal left breast cancer, ER+PR+, HER2-,   Continue with cycle 12 weekly Taxol.    #Grade 1 neuropathy, doing well on reduced dose of Taxol.  She is finishing her neoadjuvant chemotherapy and will repeat MRI breast to access response prior to surgery.  Patient to follow up with Dr.Smith to discuss mastectomy surgery.  After 4 cycles of AC, she is converted to clinically negative for left axillary lymph node. If repeat image after 12 weekly Taxol remains same, she may just need SLNB, prefer  to have 2-3 sentinel lymph nodes biopsied. If all negative, she can avoid ALND. She will need axillary RT.  If less than 2 sentinel lymph nodes are found or any of them are positive, will need ALND.   # No blood return with her Medi port. Will send for a dye study.   Follow up after patient's surgery appointment.  All questions were answered. The patient knows to call the clinic with any problems, questions or concerns.  Earlie Server, MD, PhD Hematology Oncology [facility] at [facility] Pager- [phone] 10/03/2017

## 2017-10-04 ENCOUNTER — Other Ambulatory Visit: Payer: Self-pay | Admitting: Oncology

## 2017-10-04 ENCOUNTER — Ambulatory Visit
Admission: RE | Admit: 2017-10-04 | Discharge: 2017-10-04 | Disposition: A | Discharge status: Home / Self Care | Payer: BLUE CROSS/BLUE SHIELD | Source: Ambulatory Visit | Attending: Oncology | Admitting: Oncology

## 2017-10-04 DIAGNOSIS — Z5111 Encounter for antineoplastic chemotherapy: Secondary | ICD-10-CM

## 2017-10-04 DIAGNOSIS — Z452 Encounter for adjustment and management of vascular access device: Secondary | ICD-10-CM | POA: Insufficient documentation

## 2017-10-04 DIAGNOSIS — Z95828 Presence of other vascular implants and grafts: Secondary | ICD-10-CM

## 2017-10-04 MED ORDER — IOPAMIDOL (ISOVUE-300) INJECTION 61%
5.0000 mL | Freq: Once | INTRAVENOUS | Status: AC | PRN
  Administered 2017-10-04: 5 mL

## 2017-10-04 MED ORDER — ALTEPLASE 2 MG IJ SOLR: 2.0000 mg | Freq: Once | INTRAMUSCULAR | Status: DC

## 2017-10-04 MED ORDER — HEPARIN SOD (PORK) LOCK FLUSH 100 UNIT/ML IV SOLN
INTRAVENOUS | Status: AC
  Filled 2017-10-04: qty 5

## 2017-10-09 HISTORY — PX: MASTECTOMY: SHX3

## 2017-10-16 ENCOUNTER — Ambulatory Visit: Admission: RE | Admit: 2017-10-16 | Payer: BLUE CROSS/BLUE SHIELD | Source: Ambulatory Visit

## 2017-10-16 ENCOUNTER — Ambulatory Visit (HOSPITAL_COMMUNITY)
Admission: RE | Admit: 2017-10-16 | Discharge: 2017-10-16 | Disposition: A | Discharge status: Home / Self Care | Payer: BLUE CROSS/BLUE SHIELD | Source: Ambulatory Visit | Attending: Oncology | Admitting: Oncology

## 2017-10-16 ENCOUNTER — Ambulatory Visit
Admission: RE | Admit: 2017-10-16 | Discharge: 2017-10-16 | Disposition: A | Discharge status: Home / Self Care | Payer: BLUE CROSS/BLUE SHIELD | Source: Ambulatory Visit | Attending: Oncology | Admitting: Oncology

## 2017-10-16 DIAGNOSIS — K8020 Calculus of gallbladder without cholecystitis without obstruction: Secondary | ICD-10-CM | POA: Diagnosis not present

## 2017-10-16 DIAGNOSIS — C50512 Malignant neoplasm of lower-outer quadrant of left female breast: Secondary | ICD-10-CM | POA: Insufficient documentation

## 2017-10-16 DIAGNOSIS — K439 Ventral hernia without obstruction or gangrene: Secondary | ICD-10-CM | POA: Insufficient documentation

## 2017-10-16 DIAGNOSIS — C50912 Malignant neoplasm of unspecified site of left female breast: Secondary | ICD-10-CM

## 2017-10-16 DIAGNOSIS — C773 Secondary and unspecified malignant neoplasm of axilla and upper limb lymph nodes: Secondary | ICD-10-CM | POA: Insufficient documentation

## 2017-10-16 DIAGNOSIS — K760 Fatty (change of) liver, not elsewhere classified: Secondary | ICD-10-CM | POA: Insufficient documentation

## 2017-10-16 DIAGNOSIS — Z170 Estrogen receptor positive status [ER+]: Secondary | ICD-10-CM | POA: Diagnosis not present

## 2017-10-16 DIAGNOSIS — C50412 Malignant neoplasm of upper-outer quadrant of left female breast: Secondary | ICD-10-CM | POA: Diagnosis present

## 2017-10-16 DIAGNOSIS — Z5111 Encounter for antineoplastic chemotherapy: Secondary | ICD-10-CM

## 2017-10-16 MED ORDER — GADOBENATE DIMEGLUMINE 529 MG/ML IV SOLN
20.0000 mL | Freq: Once | INTRAVENOUS | Status: AC | PRN
  Administered 2017-10-16: 19 mL via INTRAVENOUS

## 2017-10-16 MED ORDER — IOPAMIDOL (ISOVUE-300) INJECTION 61%
100.0000 mL | Freq: Once | INTRAVENOUS | Status: AC | PRN
  Administered 2017-10-16: 100 mL via INTRAVENOUS

## 2017-10-17 ENCOUNTER — Ambulatory Visit: Admission: RE | Admit: 2017-10-17 | Payer: BLUE CROSS/BLUE SHIELD | Source: Ambulatory Visit

## 2017-10-18 ENCOUNTER — Other Ambulatory Visit: Payer: Self-pay | Admitting: Surgery

## 2017-10-18 ENCOUNTER — Ambulatory Visit: Payer: BLUE CROSS/BLUE SHIELD

## 2017-10-18 DIAGNOSIS — C50412 Malignant neoplasm of upper-outer quadrant of left female breast: Secondary | ICD-10-CM

## 2017-10-18 DIAGNOSIS — Z170 Estrogen receptor positive status [ER+]: Principal | ICD-10-CM

## 2017-10-18 NOTE — Progress Notes (Signed)
[facility] [place] Follow Up Visit.  Patient Care Team: Glendon Axe, MD as PCP - General (Internal Medicine)  Reason for visit Follow up for treatment of breast cancer.   Pertinent Oncology History Dominique Guzman 67 y.o. female with PMH listed as below is referred by Dr.Schermerhorn to [name] for evaluation and management of newly diagnosed breast cancer. Patient had a history a left breast mass biopsy followed by breast excisonal biopsy in 2015 with benign pathology.  She also has a history of Uterine cancer which was treated with RT with Dr.Chrystal.  She underwent routine screening mammogram on 04/17/2017 which recommended a possible mass on the left breast. Left diagnostic mammogram showed a highly suspicious left breast mass at 4 o'clock, 7 cm from the nipple and 1 o'clock,,  About 3 cm from the nipple. Enlarged left axillary lymph node suspicious for metastatic disease.  1 US breast confirmed a left breast mass of 2.1cm, 4 o'clock, and 1.1 cm mass at 1 o'clock.The masses at 1 and 4 o'clock are separated by approximately 4 cm sonographically 2 Biopsy of the two mass and axillary lymph node revealed invasive carcinoma, grade 2. Both are ER,PR positive. HER2 equivocal, FISH pending. 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  3 05/14/2017 MRI breast showed Two sites of biopsy proven malignancy seen in the left breast, which span up to 11.3 cm. There are intervening areas of non mass enhancement and a small suspicious enhancing mass. 4 Case was discussed on breast tumor board on 05/21/2017 and the panel agree with neoadjuvant ddAC to T followed by surgery.  5 s/p 4 cycles ddAC, Tolerates well except mucositis which improved with magic mouth wash and a course of Valtrex.  currently on weekly Taxol.     INTERVAL HISTORY Patient presents to discuss MRI and CAT scan results..  she is accompanied by her husband.  Reports feeling much better with good energy levels. Denies  any fever, chills, mouth sores, nausea or vomiting. Denies numbness or tingling.    Review of Systems  Constitutional: Negative for appetite change, chills and fatigue.  HENT:   Negative for lump/mass.   Eyes: Negative for eye problems.  Respiratory: Negative for chest tightness and hemoptysis.   Cardiovascular: Negative for chest pain and leg swelling.  Gastrointestinal: Negative for abdominal distention, abdominal pain and blood in stool.  Endocrine: Negative for hot flashes.  Genitourinary: Negative for difficulty urinating and dysuria.   Musculoskeletal: Negative for arthralgias, back pain and gait problem.  Skin: Negative for itching and rash.  Neurological: Negative for dizziness, gait problem and numbness.       Finger tip sensitivity resolved.  Hematological: Negative for adenopathy.  Psychiatric/Behavioral: Negative for confusion. The patient is not nervous/anxious.    [name]   MEDICAL HISTORY: Past Medical History:  Diagnosis Date  . Anemia   . Arthritis    HIP-LEFT  . Benign neoplasm of colon   . Breast cancer ([place])   . Cancer ([place])   . Diabetes mellitus without complication ([place])   . H/O osteopenia   . Hypertension   . Sleep apnea    USE CPAP  . Uterine cancer [facility])     SURGICAL HISTORY: Past Surgical History:  Procedure Laterality Date  . ABDOMINAL HYSTERECTOMY    . BREAST BIOPSY Left 04/2014   negative  . BREAST EXCISIONAL BIOPSY Left 2015  . CESAREAN SECTION     X2  . COLONOSCOPY WITH PROPOFOL N/A 04/15/2015   Procedure:  COLONOSCOPY WITH PROPOFOL;  Surgeon: Hulen Luster, MD;  Location: [facility] ENDOSCOPY;  Service: Gastroenterology;  Laterality: N/A;  . HERNIA REPAIR    . PORTACATH PLACEMENT Right 05/22/2017   Procedure: INSERTION PORT-A-CATH;  Surgeon: Leonie Green, MD;  Location: [facility] ORS;  Service: General;  Laterality: Right;    SOCIAL HISTORY: Social History   Socioeconomic History  . Marital status: Married    Spouse name: Not on file  . Number  of children: Not on file  . Years of education: Not on file  . Highest education level: Not on file  Social Needs  . Financial resource strain: Not on file  . Food insecurity - worry: Not on file  . Food insecurity - inability: Not on file  . Transportation needs - medical: Not on file  . Transportation needs - non-medical: Not on file  Occupational History  . Not on file  Tobacco Use  . Smoking status: Never Smoker  . Smokeless tobacco: Never Used  Substance and Sexual Activity  . Alcohol use: No  . Drug use: No  . Sexual activity: Not on file  Other Topics Concern  . Not on file  Social History Narrative  . Not on file    FAMILY HISTORY Family History  Problem Relation Age of Onset  . Diabetes Mother   . Diabetes Father   . Diabetes Sister   . Breast cancer Neg Hx     ALLERGIES:  has No Known Allergies.  MEDICATIONS:  Current Outpatient Medications  Medication Sig Dispense Refill  . Calcium Carb-Cholecalciferol (CALCIUM 600-D PO) Take 1 tablet by mouth daily.    . chlorhexidine (PERIDEX) 0.12 % solution USE AS DIRECTED 15 ML IN THE MOUTH OR THROAT TWICE A DAY 473 mL 0  . CINNAMON PO Take 2 capsules by mouth daily.    . cyanocobalamin 1000 MCG tablet Take 1,000 mcg by mouth daily.    . diphenhydrAMINE (BENADRYL) 25 MG tablet Take 25 mg by mouth as needed for allergies.    [name] FIBER PO Take 1 capsule by mouth daily.    . fluticasone (FLONASE) 50 MCG/ACT nasal spray Place 2 sprays into both nostrils as needed.     . Glucosamine HCl (GLUCOSAMINE PO) Take 1 tablet by mouth daily.    [name] lidocaine-prilocaine (EMLA) cream Apply 1 application topically as needed. Apply small amount to port site at least 1 hour prior to it being accessed, cover with plastic wrap 30 g 3  . lisinopril-hydrochlorothiazide (PRINZIDE,ZESTORETIC) 20-12.5 MG per tablet Take 1 tablet by mouth daily.    [name] loperamide (IMODIUM) 2 MG capsule Take 1 capsule (2 mg total) by mouth See admin instructions. With  onset of loose stool, take 73m followed by 22mevery 2 hours until 12 hours have passed without loose bowel movement. Maximum: 16 mg/day (Patient not taking: Reported on 10/03/2017) 120 capsule 1  . magic mouthwash SOLN SWISH AND SPIT 5 TO 10 MLS 4 TIMES A DAY AS NEEDED  3  . metFORMIN (GLUCOPHAGE) 500 MG tablet Take 1,000 mg by mouth 2 (two) times daily.     . Multiple Vitamin (MULTIVITAMIN) tablet Take 1 tablet by mouth daily.    . ondansetron (ZOFRAN) 8 MG tablet Take 1 tablet (8 mg total) by mouth every 8 (eight) hours as needed for nausea or vomiting. (Patient not taking: Reported on 10/03/2017) 120 tablet 0  . pyridOXINE (VITAMIN B-6) 100 MG tablet Take 1 tablet (100 mg total) by mouth daily. 30[place]  tablet 2  . sitaGLIPtin (JANUVIA) 100 MG tablet Take 100 mg by mouth daily.    . valACYclovir (VALTREX) 1000 MG tablet Take 1 tablet (1,000 mg total) by mouth 2 (two) times daily. 14 tablet 0   No current facility-administered medications for this visit.     PHYSICAL EXAMINATION:  ECOG PERFORMANCE STATUS: 1 - Symptomatic but completely ambulatory  There were no vitals filed for this visit.  There were no vitals filed for this visit.   Physical Exam  Constitutional: She is oriented to person, place, and time and well-developed, well-nourished, and in no distress.  HENT:  Head: Normocephalic and atraumatic.  Mouth/Throat: Oropharynx is clear and moist.  Eyes: Conjunctivae and EOM are normal. Pupils are equal, round, and reactive to light. Right eye exhibits no discharge.  Neck: Normal range of motion. Neck supple.  Cardiovascular: Normal rate, regular rhythm and normal heart sounds. Exam reveals no gallop and no friction rub.  No murmur heard. Pulmonary/Chest: Effort normal and breath sounds normal. No respiratory distress. She has no rales. She exhibits no tenderness.  Abdominal: Soft. Bowel sounds are normal. She exhibits no distension. There is no tenderness. There is no rebound and no  guarding.  Musculoskeletal: Normal range of motion. She exhibits no edema or tenderness.  Lymphadenopathy:    She has no cervical adenopathy.  Neurological: She is alert and oriented to person, place, and time.  Skin: Skin is warm and dry. She is not diaphoretic. No erythema.  Psychiatric: Affect and judgment normal.  Breast exam was performed in seated and lying down position.  No palpable right breast  masses or lumps . Left breast outer quadrant mass barely palpable.  ECOG 1  LABORATORY DATA: CBC Latest Ref Rng & Units 10/03/2017 09/26/2017 09/19/2017  WBC 3.6 - 11.0 K/uL 6.3 6.6 6.8  Hemoglobin 12.0 - 16.0 g/dL 9.5(L) 9.9(L) 9.8(L)  Hematocrit 35.0 - 47.0 % 27.6(L) 28.5(L) 28.4(L)  Platelets 150 - 440 K/uL 330 367 360   CMP Latest Ref Rng & Units 10/03/2017 09/26/2017 09/19/2017  Glucose 65 - 99 mg/dL 191(H) 168(H) 180(H)  BUN 6 - 20 mg/dL '12 11 12  ' Creatinine 0.44 - 1.00 mg/dL 0.56 0.49 0.59  Sodium 135 - 145 mmol/L 134(L) 132(L) 129(L)  Potassium 3.5 - 5.1 mmol/L 4.3 4.2 4.0  Chloride 101 - 111 mmol/L 102 100(L) 96(L)  CO2 22 - 32 mmol/L '26 23 24  ' Calcium 8.9 - 10.3 mg/dL 8.9 9.6 9.5  Total Protein 6.5 - 8.1 g/dL 6.3(L) 6.4(L) 6.8  Total Bilirubin 0.3 - 1.2 mg/dL 0.5 0.5 0.5  Alkaline Phos 38 - 126 U/L 69 82 70  AST 15 - 41 U/L '26 27 25  ' ALT 14 - 54 U/L '22 17 16    ' Normal  B12 and Folate. Iron panel was checked in September showed no signs of iron deficiency   RADIOGRAPHIC STUDIES: I have personally reviewed the radiological images as listed and agree with the findings in the report 04/25/2017 Mammogram Diagnostic Targeted ultrasound of the left breast demonstrates an irregular, hypoechoic mass at 1 o'clock, 3 cm from the nipple measuring 10 x 10 x 10 mm corresponding to the mass seen within the anterior upper, outer left breast. At 4 o'clock, 7 cm from the nipple, there is an irregular hypoechoic mass measuring 2.1 x 1.5 x 1.9 cm. Targeted ultrasound of the left  axilla demonstrates an enlarged, abnormal axillary lymph node demonstrating cortical thickening up to 6 mm. The masses at 1 and  4 o'clock are separated by approximately 4 cm sonographically. IMPRESSION: Highly suspicious left breast masses at 4 o'clock, 7 cm from the nipple and 1 o'clock, 3 cm from the nipple. Enlarged left axillary lymph node suspicious for metastatic disease. US breast 04/25/2017 FINDINGS: There is an irregular spiculated mass in the slightly lower, outer left breast, posterior depth measuring approximately 2.6 x 2.5 x 1.9 cm. There is an additional irregular mass within the upper, outerleft breast, anterior depth measuring approximately 1 cm in size, which is suspicious for a satellite lesion. There is asymmetric density between the 2 masses suggesting contiguous spread of disease. Mammographically, the masses are separated by approximately 6 cm and in total span approximately 9.6 cm.  Mammographic images were processed with CAD.  Targeted ultrasound of the left breast demonstrates an irregular, hypoechoic mass at 1 o'clock, 3 cm from the nipple measuring 10 x 10 x 10 mm corresponding to the mass seen within the anterior upper, outer left breast. At 4 o'clock, 7 cm from the nipple, there is an irregular hypoechoic mass measuring 2.1 x 1.5 x 1.9 cm. Targeted ultrasound of the left axilla demonstrates an enlarged, abnormal axillary lymph node demonstrating cortical thickening up to 6 mm. The masses at 1 and 4 o'clock are separated by approximately 4 cm sonographically. IMPRESSION: Highly suspicious left breast masses at 4 o'clock, 7 cm from the nipple and 1 o'clock, 3 cm from the nipple. Enlarged left axillary lymph node suspicious for metastatic disease.  07/11/2017 MRI breast Bilateral FINDINGS: Breast composition: b. Scattered fibroglandular tissue. Background parenchymal enhancement: Minimal.  Right breast: No significant change in several small nodules  possibly representing normal intramammary lymph nodes, mammographically stable.  Left breast: The previously demonstrated 2.6 x 2.2 x 2.0 cm enhancing mass in the posterior aspect of the upper-outer quadrant of the left breast demonstrates significantly less enhancement. This is currently very low-grade with persistent enhancement kinetics. This currently measures 2.6 x 1.6 x 1.6 cm on image number 131 of series 10603 and in the sagittal plane.  The previously demonstrated 1.7 x 1.3 x 0.9 cm anterior enhancing mass containing a biopsy marker clip artifact currently measures 1.4 x 1.0 x 0.6 cm on image number 132 of series 10603 and in the sagittal plane. This also demonstrates some decrease in enhancement with a mixture of enhancement kinetics, including rapid wash-in/ washout.  There are are 2 additional linear areas of non mass enhancement between these areas, currently measuring 3.9 cm in length on axial image number 121 and 3.0 cm in length on axial image number 113 of the same series.  The 1.4 x 0.7 x 0.5 cm third enhancing mass enhancement between the 2 biopsied areas previously is significantly smaller, currently measuring 0.7 x 0.3 x 0.2 cm on image number 99 of the same series and in the sagittal plane.  The residual enhancing areas in the left breast span an area measuring 10.1 cm.  Lymph nodes: No abnormal appearing lymph nodes. The previously demonstrated prominent left axillary lymph nodes are no longer prominent. Ancillary findings:  None.  IMPRESSION: 1. Partial imaging response of the known malignancy in the left breast, as described above, currently spanning 10.1 cm. 2. Complete imaging response of previously demonstrated left axillary adenopathy. 3. No evidence of malignancy on the right.   PATHOLOGY DIAGNOSIS:  A. BREAST, LEFT, 4 O'CLOCK 7 CM FROM NIPPLE; ULTRASOUND-GUIDED CORE BIOPSY:  - INVASIVE MAMMARY CARCINOMA, NO SPECIAL TYPE, GRADE 2, SEE COMMENT.   - INVASIVE CARCINOMA MEASURES  15 MM IN THIS SAMPLE.  B. BREAST, LEFT, 1 O'CLOCK, 3 CM FROM NIPPLE; ULTRASOUND-GUIDED CORE  BIOPSY:  - INVASIVE MAMMARY CARCINOMA, NO SPECIAL TYPE, GRADE 2, SEE COMMENT.  - INVASIVE CARCINOMA MEASURES 10 MM IN THIS SAMPLE.  C. LYMPH NODE, LEFT AXILLA; ULTRASOUND-GUIDED CORE BIOPSY:  - METASTATIC CARCINOMA, 7 MM METASTASIS, SEE COMMENT.    BREAST BIOMARKER TEST RESULTS - TUMOR A, 4 O'CLOCK:  Estrogen Receptor (ER) Status: POSITIVE, >90% of cells with nuclear  positivity     Average intensity of staining: Strong  Progesterone Receptor (PR) Status: POSITIVE, >90% of cells with nuclear  positivity     Average intensity of staining: Strong  HER2 (by immunohistochemistry): Equivocal (score 2+)  Percentage of cells with uniform intense complete membrane staining: 1%   BREAST BIOMARKER TEST RESULTS - TUMOR B, 1 O'CLOCK:  Estrogen Receptor (ER) Status: POSITIVE, >90% of cells with nuclear  positivity     Average intensity of staining: Strong  Progesterone Receptor (PR) Status: POSITIVE, 51-90% of cells with  nuclear positivity     Average intensity of staining: Moderate to strong  HER2 (by immunohistochemistry): Equivocal (score 2+)  Percentage of cells with uniform intense complete membrane staining: 2%    ADDENDUM #2   BREAST BIOMARKER TEST: HER2 FISH - TUMOR A, 4 O'CLOCK:  HER2 (ERBB2) (by in situ hybridization): NEGATIVE (not amplified)  Number of observers: 2  Number of invasive tumor cells counted: 40  Dual probe assay  Average number of HER2 signals per cell: 3.6  Average number of CEP17 signals per cell: 2.5  HER2/CEP17 ratio: 1.42   05/14/2017 MRI breast IMPRESSION: 1. Two sites of biopsy proven malignancy seen in the left breast, which span up to 11.3 cm. There are intervening areas of non mass enhancement and a small suspicious enhancing mass. 2. The biopsy-proven metastatic left axillary lymph node is identified. There are  3 other prominent left axillary lymph nodes which are not definitely pathologically enlarged. 3.  No evidence of right breast malignancy  CT chest and abdomen pelvis with contrast showed no metastatic disease. MRI breast overall impression showed no significant change in the residual disease area of low-grade enhancement in the central and upper outer left breast. No residual adenopathy. No evidence of malignancy on the right. Also the actual measurement of area of area of enhancement appear to be slightly larger, was mentioned in the report that both breasts are currently significantly elongated compared to the previous examination possibly due to difference in patient positioning and oral patient weight.  ASSESSMENT/PLAN 67 y.o. 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  Cancer Staging Malignant neoplasm of lower-outer quadrant of left breast of female, estrogen receptor positive ([place]) Staging form: Breast, AJCC 8th Edition - Clinical stage from 04/25/2017: Stage IIA (cT3(m), cN1(f), cM0, G2, ER: Positive, PR: Positive, HER2: Equivocal) - Signed by Earlie Server, MD on 05/23/2017  Malignant neoplasm of upper-outer quadrant of left breast in female, estrogen receptor positive ([place]) Staging form: Breast, AJCC 8th Edition - Clinical: No stage assigned - Unsigned    1. Malignant neoplasm of upper-outer quadrant of left breast in female, estrogen receptor positive ([place])   2. Malignant neoplasm of lower-outer quadrant of left breast of female, estrogen receptor positive ([place])    # Multifocal left breast cancer, ER+PR+, HER2-,   s/p ddAC x4 and weekly Taxol x12  #  Grade 1 neuropathy, doing well # She is converted to clinically negative left axillary lymph node. She has followed up with Dr.Smith.  For her axillary management, I recommend 2-3  sentinel lymph node biopsy. If all negative, she can avoid ALND. Need to see RadOnc to get axillary RT.  If less than 2 sentinel lymph nodes are found or any of them are positive, will need ALND.  # No blood return with her Medi port. Obtained dye study. Cathflo x1  Today. Schedule port flush in 6 weeks. After she finishes all her treatment, She prefers to take out the port.  will send to surgeon to have Medi port taken out.     Follow up in 3 weeks to discuss pathology All questions were answered. The patient knows to call the clinic with any problems, questions or concerns.  Earlie Server, MD, PhD Hematology Oncology [facility] Pager- [phone] 10/19/2017

## 2017-10-19 ENCOUNTER — Inpatient Hospital Stay: Payer: BLUE CROSS/BLUE SHIELD

## 2017-10-19 ENCOUNTER — Encounter: Payer: Self-pay | Admitting: Oncology

## 2017-10-19 ENCOUNTER — Other Ambulatory Visit: Payer: Self-pay

## 2017-10-19 ENCOUNTER — Inpatient Hospital Stay: Payer: BLUE CROSS/BLUE SHIELD | Attending: Oncology | Admitting: Oncology

## 2017-10-19 VITALS — BP 130/77 | HR 97 | Temp 98.8°F | Wt 194.5 lb

## 2017-10-19 DIAGNOSIS — Z79899 Other long term (current) drug therapy: Secondary | ICD-10-CM | POA: Diagnosis not present

## 2017-10-19 DIAGNOSIS — E1140 Type 2 diabetes mellitus with diabetic neuropathy, unspecified: Secondary | ICD-10-CM

## 2017-10-19 DIAGNOSIS — Z923 Personal history of irradiation: Secondary | ICD-10-CM

## 2017-10-19 DIAGNOSIS — M8580 Other specified disorders of bone density and structure, unspecified site: Secondary | ICD-10-CM | POA: Diagnosis not present

## 2017-10-19 DIAGNOSIS — Z8542 Personal history of malignant neoplasm of other parts of uterus: Secondary | ICD-10-CM

## 2017-10-19 DIAGNOSIS — C50412 Malignant neoplasm of upper-outer quadrant of left female breast: Secondary | ICD-10-CM | POA: Diagnosis present

## 2017-10-19 DIAGNOSIS — I10 Essential (primary) hypertension: Secondary | ICD-10-CM | POA: Diagnosis not present

## 2017-10-19 DIAGNOSIS — G4730 Sleep apnea, unspecified: Secondary | ICD-10-CM

## 2017-10-19 DIAGNOSIS — Z170 Estrogen receptor positive status [ER+]: Secondary | ICD-10-CM

## 2017-10-19 DIAGNOSIS — C50512 Malignant neoplasm of lower-outer quadrant of left female breast: Secondary | ICD-10-CM | POA: Diagnosis not present

## 2017-10-19 DIAGNOSIS — C773 Secondary and unspecified malignant neoplasm of axilla and upper limb lymph nodes: Secondary | ICD-10-CM

## 2017-10-19 DIAGNOSIS — M1990 Unspecified osteoarthritis, unspecified site: Secondary | ICD-10-CM

## 2017-10-19 DIAGNOSIS — Z7984 Long term (current) use of oral hypoglycemic drugs: Secondary | ICD-10-CM

## 2017-10-19 DIAGNOSIS — Z95828 Presence of other vascular implants and grafts: Secondary | ICD-10-CM

## 2017-10-19 MED ORDER — HEPARIN SOD (PORK) LOCK FLUSH 100 UNIT/ML IV SOLN
500.0000 [IU] | Freq: Once | INTRAVENOUS | Status: AC
  Administered 2017-10-19: 500 [IU] via INTRAVENOUS
  Filled 2017-10-19: qty 5

## 2017-10-19 MED ORDER — ALTEPLASE 2 MG IJ SOLR
2.0000 mg | Freq: Once | INTRAMUSCULAR | Status: AC
  Administered 2017-10-19: 2 mg
  Filled 2017-10-19: qty 2

## 2017-10-19 MED ORDER — SODIUM CHLORIDE 0.9% FLUSH
10.0000 mL | INTRAVENOUS | Status: DC | PRN
  Administered 2017-10-19 (×2): 10 mL via INTRAVENOUS
  Filled 2017-10-19: qty 10

## 2017-10-19 NOTE — Progress Notes (Signed)
Patient here today for follow up.   

## 2017-10-22 ENCOUNTER — Encounter
Admission: RE | Admit: 2017-10-22 | Discharge: 2017-10-22 | Disposition: A | Discharge status: Home / Self Care | Payer: BLUE CROSS/BLUE SHIELD | Source: Ambulatory Visit | Attending: Surgery | Admitting: Surgery

## 2017-10-22 DIAGNOSIS — I10 Essential (primary) hypertension: Secondary | ICD-10-CM | POA: Insufficient documentation

## 2017-10-22 DIAGNOSIS — Z7984 Long term (current) use of oral hypoglycemic drugs: Secondary | ICD-10-CM | POA: Insufficient documentation

## 2017-10-22 DIAGNOSIS — E119 Type 2 diabetes mellitus without complications: Secondary | ICD-10-CM | POA: Diagnosis not present

## 2017-10-22 DIAGNOSIS — Z8249 Family history of ischemic heart disease and other diseases of the circulatory system: Secondary | ICD-10-CM | POA: Diagnosis not present

## 2017-10-22 DIAGNOSIS — C50812 Malignant neoplasm of overlapping sites of left female breast: Secondary | ICD-10-CM | POA: Diagnosis not present

## 2017-10-22 DIAGNOSIS — E7800 Pure hypercholesterolemia, unspecified: Secondary | ICD-10-CM | POA: Diagnosis not present

## 2017-10-22 DIAGNOSIS — Z8542 Personal history of malignant neoplasm of other parts of uterus: Secondary | ICD-10-CM | POA: Insufficient documentation

## 2017-10-22 DIAGNOSIS — Z833 Family history of diabetes mellitus: Secondary | ICD-10-CM | POA: Insufficient documentation

## 2017-10-22 DIAGNOSIS — Z86010 Personal history of colonic polyps: Secondary | ICD-10-CM | POA: Insufficient documentation

## 2017-10-22 DIAGNOSIS — Z79899 Other long term (current) drug therapy: Secondary | ICD-10-CM | POA: Diagnosis not present

## 2017-10-22 DIAGNOSIS — Z01818 Encounter for other preprocedural examination: Secondary | ICD-10-CM | POA: Insufficient documentation

## 2017-10-22 DIAGNOSIS — Z170 Estrogen receptor positive status [ER+]: Secondary | ICD-10-CM | POA: Diagnosis not present

## 2017-10-22 DIAGNOSIS — Z923 Personal history of irradiation: Secondary | ICD-10-CM | POA: Diagnosis not present

## 2017-10-22 DIAGNOSIS — D649 Anemia, unspecified: Secondary | ICD-10-CM | POA: Insufficient documentation

## 2017-10-22 DIAGNOSIS — Z8349 Family history of other endocrine, nutritional and metabolic diseases: Secondary | ICD-10-CM | POA: Diagnosis not present

## 2017-10-22 DIAGNOSIS — G4730 Sleep apnea, unspecified: Secondary | ICD-10-CM | POA: Insufficient documentation

## 2017-10-22 DIAGNOSIS — Z90722 Acquired absence of ovaries, bilateral: Secondary | ICD-10-CM | POA: Insufficient documentation

## 2017-10-22 HISTORY — DX: Personal history of other diseases of the digestive system: Z87.19

## 2017-10-22 NOTE — Patient Instructions (Signed)
Your procedure is scheduled on1/18/19 Report to Radiology. AT 0915 AM MEDICAL MALL ENTRANCE .  Remember: Instructions that are not followed completely may result in serious medical risk, up to and including death, or upon the discretion of your surgeon and anesthesiologist your surgery may need to be rescheduled.     _X__ 1. Do not eat food after midnight the night before your procedure.                 No gum chewing or hard candies. You may drink clear liquids up to 2 hours                 before you are scheduled to arrive for your surgery- DO not drink clear                 liquids within 2 hours of the start of your surgery.                 Clear Liquids include:  water, apple juice without pulp, clear carbohydrate                 drink such as Clearfast of Gartorade, Black Coffee or Tea (Do not add                 anything to coffee or tea).     _X__ 2.  No Alcohol for 24 hours before or after surgery.   _X__ 3.  Do Not Smoke or use e-cigarettes For 24 Hours Prior to Your Surgery.                 Do not use any chewable tobacco products for at least 6 hours prior to                 surgery.  ____  4.  Bring all medications with you on the day of surgery if instructed.   __X__  5.  Notify your doctor if there is any change in your medical condition      (cold, fever, infections).     Do not wear jewelry, make-up, hairpins, clips or nail polish. Do not wear lotions, powders, or perfumes. You may wear deodorant. Do not shave 48 hours prior to surgery. Men may shave face and neck. Do not bring valuables to the hospital.    [facility] is not responsible for any belongings or valuables.  Contacts, dentures or bridgework may not be worn into surgery. Leave your suitcase in the car. After surgery it may be brought to your room. For patients admitted to the hospital, discharge time is determined by your treatment team.   Patients discharged the day of surgery  will not be allowed to drive home.   Please read over the following fact sheets that you were given:   Surgical Site Infection Prevention   _X___ Take these medicines the morning of surgery with A SIP OF WATER:    1.NONE  2.   3.   4.  5.  6.  ____ Fleet Enema (as directed)   ____ Use CHG Soap as directed  ____ Use inhalers on the day of surgery  _X___ Stop metformin 2 days prior to surgery    ____ Take 1/2 of usual insulin dose the night before surgery. No insulin the morning          of surgery.   ____ Stop Coumadin/Plavix/aspirin on ____ Stop Anti-inflammatories on    _X___ Stop supplements until after surgery.  STOP GLUCOSAMIN AND CINNAMON BARK UNTIL AFTER SURGERY  _X___ Bring C-Pap to the hospital.

## 2017-10-26 ENCOUNTER — Observation Stay
Admission: RE | Admit: 2017-10-26 | Discharge: 2017-10-27 | Disposition: A | Discharge status: Home / Self Care | Payer: BLUE CROSS/BLUE SHIELD | Source: Ambulatory Visit | Attending: Surgery | Admitting: Surgery

## 2017-10-26 ENCOUNTER — Other Ambulatory Visit: Payer: Self-pay

## 2017-10-26 ENCOUNTER — Encounter: Payer: Self-pay | Admitting: Certified Registered Nurse Anesthetist

## 2017-10-26 ENCOUNTER — Encounter
Admission: RE | Disposition: A | Discharge status: Home / Self Care | Payer: Self-pay | Source: Ambulatory Visit | Attending: Surgery

## 2017-10-26 ENCOUNTER — Ambulatory Visit: Payer: BLUE CROSS/BLUE SHIELD | Admitting: Certified Registered Nurse Anesthetist

## 2017-10-26 ENCOUNTER — Ambulatory Visit
Admission: RE | Admit: 2017-10-26 | Discharge: 2017-10-26 | Disposition: A | Discharge status: Home / Self Care | Payer: BLUE CROSS/BLUE SHIELD | Source: Ambulatory Visit | Attending: Surgery | Admitting: Surgery

## 2017-10-26 DIAGNOSIS — Z6841 Body Mass Index (BMI) 40.0 and over, adult: Secondary | ICD-10-CM | POA: Diagnosis not present

## 2017-10-26 DIAGNOSIS — E669 Obesity, unspecified: Secondary | ICD-10-CM | POA: Insufficient documentation

## 2017-10-26 DIAGNOSIS — I10 Essential (primary) hypertension: Secondary | ICD-10-CM | POA: Insufficient documentation

## 2017-10-26 DIAGNOSIS — Z9221 Personal history of antineoplastic chemotherapy: Secondary | ICD-10-CM | POA: Diagnosis not present

## 2017-10-26 DIAGNOSIS — Z170 Estrogen receptor positive status [ER+]: Secondary | ICD-10-CM | POA: Diagnosis not present

## 2017-10-26 DIAGNOSIS — G4730 Sleep apnea, unspecified: Secondary | ICD-10-CM | POA: Diagnosis not present

## 2017-10-26 DIAGNOSIS — Z79899 Other long term (current) drug therapy: Secondary | ICD-10-CM | POA: Diagnosis not present

## 2017-10-26 DIAGNOSIS — C773 Secondary and unspecified malignant neoplasm of axilla and upper limb lymph nodes: Secondary | ICD-10-CM | POA: Insufficient documentation

## 2017-10-26 DIAGNOSIS — M1990 Unspecified osteoarthritis, unspecified site: Secondary | ICD-10-CM | POA: Diagnosis not present

## 2017-10-26 DIAGNOSIS — C50919 Malignant neoplasm of unspecified site of unspecified female breast: Secondary | ICD-10-CM | POA: Diagnosis present

## 2017-10-26 DIAGNOSIS — C50912 Malignant neoplasm of unspecified site of left female breast: Secondary | ICD-10-CM | POA: Diagnosis present

## 2017-10-26 DIAGNOSIS — E119 Type 2 diabetes mellitus without complications: Secondary | ICD-10-CM | POA: Diagnosis not present

## 2017-10-26 DIAGNOSIS — C50412 Malignant neoplasm of upper-outer quadrant of left female breast: Secondary | ICD-10-CM | POA: Diagnosis not present

## 2017-10-26 HISTORY — PX: MASTECTOMY MODIFIED RADICAL: SHX5962

## 2017-10-26 HISTORY — PX: MASTECTOMY W/ SENTINEL NODE BIOPSY: SHX2001

## 2017-10-26 LAB — GLUCOSE, CAPILLARY
GLUCOSE-CAPILLARY: 223 mg/dL — AB (ref 65–99)
GLUCOSE-CAPILLARY: 292 mg/dL — AB (ref 65–99)
GLUCOSE-CAPILLARY: 330 mg/dL — AB (ref 65–99)
Glucose-Capillary: 262 mg/dL — ABNORMAL HIGH (ref 65–99)

## 2017-10-26 SURGERY — MASTECTOMY WITH SENTINEL LYMPH NODE BIOPSY
Anesthesia: General | Laterality: Left

## 2017-10-26 MED ORDER — PHENYLEPHRINE HCL 10 MG/ML IJ SOLN
INTRAMUSCULAR | Status: DC | PRN
  Administered 2017-10-26: 25 ug/min via INTRAVENOUS

## 2017-10-26 MED ORDER — TECHNETIUM TC 99M SULFUR COLLOID FILTERED
1.0100 | Freq: Once | INTRAVENOUS | Status: AC | PRN
  Administered 2017-10-26: 1.01 via INTRADERMAL

## 2017-10-26 MED ORDER — MORPHINE SULFATE (PF) 2 MG/ML IV SOLN: 1.0000 mg | INTRAVENOUS | Status: DC | PRN

## 2017-10-26 MED ORDER — ACETAMINOPHEN 10 MG/ML IV SOLN
INTRAVENOUS | Status: DC | PRN
  Administered 2017-10-26: 1000 mg via INTRAVENOUS

## 2017-10-26 MED ORDER — LISINOPRIL 20 MG PO TABS
20.0000 mg | ORAL_TABLET | Freq: Every day | ORAL | Status: DC
  Administered 2017-10-26 – 2017-10-27 (×2): 20 mg via ORAL
  Filled 2017-10-26 (×2): qty 1

## 2017-10-26 MED ORDER — FENTANYL CITRATE (PF) 100 MCG/2ML IJ SOLN
INTRAMUSCULAR | Status: AC
  Filled 2017-10-26: qty 2

## 2017-10-26 MED ORDER — ONDANSETRON HCL 4 MG PO TABS: 8.0000 mg | ORAL_TABLET | Freq: Three times a day (TID) | ORAL | Status: DC | PRN

## 2017-10-26 MED ORDER — ACETAMINOPHEN 650 MG RE SUPP: 650.0000 mg | Freq: Four times a day (QID) | RECTAL | Status: DC | PRN

## 2017-10-26 MED ORDER — FAMOTIDINE 20 MG PO TABS
20.0000 mg | ORAL_TABLET | Freq: Once | ORAL | Status: AC
  Administered 2017-10-26: 20 mg via ORAL

## 2017-10-26 MED ORDER — VITAMIN B-6 50 MG PO TABS
100.0000 mg | ORAL_TABLET | Freq: Every day | ORAL | Status: DC
  Administered 2017-10-26 – 2017-10-27 (×2): 100 mg via ORAL
  Filled 2017-10-26 (×2): qty 2

## 2017-10-26 MED ORDER — PROPOFOL 10 MG/ML IV BOLUS
INTRAVENOUS | Status: DC | PRN
  Administered 2017-10-26: 30 mg via INTRAVENOUS
  Administered 2017-10-26: 170 mg via INTRAVENOUS

## 2017-10-26 MED ORDER — INSULIN ASPART 100 UNIT/ML ~~LOC~~ SOLN
SUBCUTANEOUS | Status: AC
  Filled 2017-10-26: qty 1

## 2017-10-26 MED ORDER — LIDOCAINE HCL (CARDIAC) 20 MG/ML IV SOLN
INTRAVENOUS | Status: DC | PRN
  Administered 2017-10-26: 100 mg via INTRAVENOUS

## 2017-10-26 MED ORDER — DEXAMETHASONE SODIUM PHOSPHATE 10 MG/ML IJ SOLN
INTRAMUSCULAR | Status: DC | PRN
  Administered 2017-10-26: 5 mg via INTRAVENOUS

## 2017-10-26 MED ORDER — ONDANSETRON HCL 4 MG/2ML IJ SOLN: 4.0000 mg | Freq: Four times a day (QID) | INTRAMUSCULAR | Status: DC | PRN

## 2017-10-26 MED ORDER — FENTANYL CITRATE (PF) 250 MCG/5ML IJ SOLN
INTRAMUSCULAR | Status: AC
  Filled 2017-10-26: qty 5

## 2017-10-26 MED ORDER — ONDANSETRON HCL 4 MG/2ML IJ SOLN
INTRAMUSCULAR | Status: AC
  Filled 2017-10-26: qty 2

## 2017-10-26 MED ORDER — SUCCINYLCHOLINE CHLORIDE 20 MG/ML IJ SOLN
INTRAMUSCULAR | Status: DC | PRN
  Administered 2017-10-26: 100 mg via INTRAVENOUS

## 2017-10-26 MED ORDER — ONDANSETRON HCL 4 MG/2ML IJ SOLN: 4.0000 mg | Freq: Once | INTRAMUSCULAR | Status: DC | PRN

## 2017-10-26 MED ORDER — ACETAMINOPHEN 325 MG PO TABS: 650.0000 mg | ORAL_TABLET | Freq: Four times a day (QID) | ORAL | Status: DC | PRN

## 2017-10-26 MED ORDER — MIDAZOLAM HCL 2 MG/2ML IJ SOLN
INTRAMUSCULAR | Status: DC | PRN
  Administered 2017-10-26: 2 mg via INTRAVENOUS

## 2017-10-26 MED ORDER — SEVOFLURANE IN SOLN
RESPIRATORY_TRACT | Status: AC
  Filled 2017-10-26: qty 250

## 2017-10-26 MED ORDER — PHENYLEPHRINE HCL 10 MG/ML IJ SOLN
INTRAMUSCULAR | Status: DC | PRN
  Administered 2017-10-26 (×3): 100 ug via INTRAVENOUS
  Administered 2017-10-26: 50 ug via INTRAVENOUS
  Administered 2017-10-26: 200 ug via INTRAVENOUS
  Administered 2017-10-26: 100 ug via INTRAVENOUS
  Administered 2017-10-26: 200 ug via INTRAVENOUS
  Administered 2017-10-26: 100 ug via INTRAVENOUS
  Administered 2017-10-26: 50 ug via INTRAVENOUS
  Administered 2017-10-26: 100 ug via INTRAVENOUS
  Administered 2017-10-26: 50 ug via INTRAVENOUS

## 2017-10-26 MED ORDER — FLUTICASONE PROPIONATE 50 MCG/ACT NA SUSP
2.0000 | Freq: Every day | NASAL | Status: DC | PRN
  Filled 2017-10-26: qty 16

## 2017-10-26 MED ORDER — LISINOPRIL-HYDROCHLOROTHIAZIDE 20-12.5 MG PO TABS: 1.0000 | ORAL_TABLET | Freq: Every day | ORAL | Status: DC

## 2017-10-26 MED ORDER — FENTANYL CITRATE (PF) 100 MCG/2ML IJ SOLN
25.0000 ug | INTRAMUSCULAR | Status: DC | PRN
  Administered 2017-10-26 (×2): 25 ug via INTRAVENOUS

## 2017-10-26 MED ORDER — ONDANSETRON HCL 4 MG/2ML IJ SOLN
INTRAMUSCULAR | Status: DC | PRN
  Administered 2017-10-26: 4 mg via INTRAVENOUS

## 2017-10-26 MED ORDER — ACETAMINOPHEN 10 MG/ML IV SOLN
INTRAVENOUS | Status: AC
  Filled 2017-10-26: qty 100

## 2017-10-26 MED ORDER — PROPOFOL 10 MG/ML IV BOLUS
INTRAVENOUS | Status: AC
  Filled 2017-10-26: qty 20

## 2017-10-26 MED ORDER — CALCIUM CARBONATE-VITAMIN D 500-200 MG-UNIT PO TABS
1.0000 | ORAL_TABLET | Freq: Every day | ORAL | Status: DC
  Administered 2017-10-26 – 2017-10-27 (×2): 1 via ORAL
  Filled 2017-10-26 (×2): qty 1

## 2017-10-26 MED ORDER — DIPHENHYDRAMINE HCL 25 MG PO CAPS: 25.0000 mg | ORAL_CAPSULE | Freq: Four times a day (QID) | ORAL | Status: DC | PRN

## 2017-10-26 MED ORDER — FENTANYL CITRATE (PF) 100 MCG/2ML IJ SOLN
INTRAMUSCULAR | Status: DC | PRN
  Administered 2017-10-26 (×4): 25 ug via INTRAVENOUS
  Administered 2017-10-26 (×2): 50 ug via INTRAVENOUS

## 2017-10-26 MED ORDER — ONDANSETRON 4 MG PO TBDP: 4.0000 mg | ORAL_TABLET | Freq: Four times a day (QID) | ORAL | Status: DC | PRN

## 2017-10-26 MED ORDER — LINAGLIPTIN 5 MG PO TABS
5.0000 mg | ORAL_TABLET | Freq: Every day | ORAL | Status: DC
  Administered 2017-10-26 – 2017-10-27 (×2): 5 mg via ORAL
  Filled 2017-10-26 (×2): qty 1

## 2017-10-26 MED ORDER — METFORMIN HCL 500 MG PO TABS
1000.0000 mg | ORAL_TABLET | Freq: Two times a day (BID) | ORAL | Status: DC
  Administered 2017-10-26 – 2017-10-27 (×2): 1000 mg via ORAL
  Filled 2017-10-26 (×2): qty 2

## 2017-10-26 MED ORDER — ADULT MULTIVITAMIN W/MINERALS CH
1.0000 | ORAL_TABLET | Freq: Every day | ORAL | Status: DC
  Administered 2017-10-26 – 2017-10-27 (×2): 1 via ORAL
  Filled 2017-10-26 (×2): qty 1

## 2017-10-26 MED ORDER — FAMOTIDINE 20 MG PO TABS
ORAL_TABLET | ORAL | Status: AC
  Administered 2017-10-26: 20 mg via ORAL
  Filled 2017-10-26: qty 1

## 2017-10-26 MED ORDER — GLYCOPYRROLATE 0.2 MG/ML IJ SOLN
INTRAMUSCULAR | Status: AC
  Filled 2017-10-26: qty 1

## 2017-10-26 MED ORDER — SODIUM CHLORIDE 0.9 % IV SOLN
INTRAVENOUS | Status: DC
  Administered 2017-10-26: 11:00:00 via INTRAVENOUS

## 2017-10-26 MED ORDER — MIDAZOLAM HCL 2 MG/2ML IJ SOLN
INTRAMUSCULAR | Status: AC
  Filled 2017-10-26: qty 2

## 2017-10-26 MED ORDER — DEXTROSE-NACL 5-0.2 % IV SOLN
INTRAVENOUS | Status: DC
  Administered 2017-10-26: 18:00:00 via INTRAVENOUS

## 2017-10-26 MED ORDER — LIDOCAINE HCL (PF) 2 % IJ SOLN
INTRAMUSCULAR | Status: AC
  Filled 2017-10-26: qty 10

## 2017-10-26 MED ORDER — INSULIN ASPART 100 UNIT/ML ~~LOC~~ SOLN
5.0000 [IU] | Freq: Once | SUBCUTANEOUS | Status: AC
  Administered 2017-10-26: 5 [IU] via SUBCUTANEOUS

## 2017-10-26 MED ORDER — HYDROCODONE-ACETAMINOPHEN 5-325 MG PO TABS
1.0000 | ORAL_TABLET | ORAL | Status: DC | PRN
  Administered 2017-10-27: 2 via ORAL
  Filled 2017-10-26: qty 2

## 2017-10-26 MED ORDER — FENTANYL CITRATE (PF) 100 MCG/2ML IJ SOLN
INTRAMUSCULAR | Status: AC
  Administered 2017-10-26: 25 ug via INTRAVENOUS
  Filled 2017-10-26: qty 2

## 2017-10-26 MED ORDER — DEXAMETHASONE SODIUM PHOSPHATE 10 MG/ML IJ SOLN
INTRAMUSCULAR | Status: AC
  Filled 2017-10-26: qty 1

## 2017-10-26 MED ORDER — HYDROCHLOROTHIAZIDE 12.5 MG PO CAPS
12.5000 mg | ORAL_CAPSULE | Freq: Every day | ORAL | Status: DC
  Administered 2017-10-26 – 2017-10-27 (×2): 12.5 mg via ORAL
  Filled 2017-10-26 (×2): qty 1

## 2017-10-26 SURGICAL SUPPLY — 29 items
BULB RESERV EVAC DRAIN JP 100C (MISCELLANEOUS) ×6 IMPLANT
CANISTER SUCT 1200ML W/VALVE (MISCELLANEOUS) ×3 IMPLANT
CHLORAPREP W/TINT 26ML (MISCELLANEOUS) ×6 IMPLANT
DERMABOND ADVANCED (GAUZE/BANDAGES/DRESSINGS) ×2
DERMABOND ADVANCED .7 DNX12 (GAUZE/BANDAGES/DRESSINGS) ×1 IMPLANT
DRAIN CHANNEL JP 15F RND 16 (MISCELLANEOUS) IMPLANT
DRAIN CHANNEL JP 19F (MISCELLANEOUS) ×6 IMPLANT
DRAPE LAPAROTOMY TRNSV 106X77 (MISCELLANEOUS) ×3 IMPLANT
ELECT REM PT RETURN 9FT ADLT (ELECTROSURGICAL) ×3
ELECTRODE REM PT RTRN 9FT ADLT (ELECTROSURGICAL) ×1 IMPLANT
GAUZE SPONGE 4X4 12PLY STRL (GAUZE/BANDAGES/DRESSINGS) ×3 IMPLANT
GLOVE BIO SURGEON STRL SZ7.5 (GLOVE) ×3 IMPLANT
GOWN STRL REUS W/ TWL LRG LVL3 (GOWN DISPOSABLE) ×3 IMPLANT
GOWN STRL REUS W/TWL LRG LVL3 (GOWN DISPOSABLE) ×6
KIT RM TURNOVER STRD PROC AR (KITS) ×3 IMPLANT
LABEL OR SOLS (LABEL) IMPLANT
PACK BASIN MAJOR ARMC (MISCELLANEOUS) ×3 IMPLANT
SUT ETHILON 3-0 FS-10 30 BLK (SUTURE) ×6
SUT ETHILON 4-0 (SUTURE)
SUT ETHILON 4-0 FS2 18XMFL BLK (SUTURE)
SUT MNCRL 4-0 (SUTURE) ×4
SUT MNCRL 4-0 27XMFL (SUTURE) ×2
SUT SILK 3-0 (SUTURE) ×4
SUT SILK 3-0 SH-1 18XCR BRD (SUTURE) ×2
SUT VICRYL+ 3-0 144IN (SUTURE) ×3 IMPLANT
SUTURE EHLN 3-0 FS-10 30 BLK (SUTURE) ×2 IMPLANT
SUTURE ETHLN 4-0 FS2 18XMF BLK (SUTURE) IMPLANT
SUTURE MNCRL 4-0 27XMF (SUTURE) ×2 IMPLANT
SUTURE SILK 3-0 SH-1 18XCR BRD (SUTURE) ×2 IMPLANT

## 2017-10-26 NOTE — Anesthesia Preprocedure Evaluation (Addendum)
Anesthesia Evaluation  Patient identified by MRN, date of birth, ID band Patient awake    Reviewed: Allergy & Precautions, NPO status , Patient's Chart, lab work & pertinent test results, reviewed documented beta blocker date and time   Airway Mallampati: III  TM Distance: >3 FB     Dental  (+) Chipped   Pulmonary sleep apnea ,           Cardiovascular hypertension, Pt. on medications      Neuro/Psych    GI/Hepatic hiatal hernia,   Endo/Other  diabetes, Type 2  Renal/GU      Musculoskeletal  (+) Arthritis ,   Abdominal   Peds  Hematology  (+) anemia ,   Anesthesia Other Findings Obese.  Reproductive/Obstetrics                            Anesthesia Physical Anesthesia Plan  ASA: III  Anesthesia Plan: General   Post-op Pain Management:    Induction: Intravenous  PONV Risk Score and Plan:   Airway Management Planned: Oral ETT  Additional Equipment:   Intra-op Plan:   Post-operative Plan:   Informed Consent: I have reviewed the patients History and Physical, chart, labs and discussed the procedure including the risks, benefits and alternatives for the proposed anesthesia with the patient or authorized representative who has indicated his/her understanding and acceptance.     Plan Discussed with: CRNA  Anesthesia Plan Comments:         Anesthesia Quick Evaluation

## 2017-10-26 NOTE — Op Note (Signed)
OPERATIVE REPORT  PREOPERATIVE  DIAGNOSIS: .  Left breast cancer  POSTOPERATIVE DIAGNOSIS: .  Left breast cancer  PROCEDURE: .  Left axillary sentinel lymph node biopsy, left modified radical mastectomy  ANESTHESIA:  General  SURGEON: Rochel Brome  MD   INDICATIONS: .  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  She has had neoadjuvant chemotherapy.  She had preoperative injection of radioactive technetium sulfur colloid.  With the patient on the operating table in the supine position under general anesthesia arms were placed on the lateral arm supports.  The left breast and surrounding chest wall were prepared with ChloraPrep and draped in a sterile manner.  A transversely obliquely oriented elliptical incision was made above and below the breast.  Electrocautery was used for hemostasis.  3-0 silk sutures were placed along the skin edges for traction.  Skin and subcutaneous flaps were raised in the direction of the clavicle, medially to the sternum inferiorly to the inferior mammary fold and laterally to the latissimus dorsi muscle.  It is noted that during the course of the surgery multiple clamped bleeding points were suture ligated with 3-0 chromic.  Dissection was carried out in the axilla and studied the axilla with a gamma counter demonstrating the location of radioactivity.  A sentinel lymph node was encountered in the inferior aspect of the axilla and removed and was about 6 mm in size and submitted for frozen section.  With additional study with a gamma counter two other sentinel lymph nodes were found and removed and placed in a second container for pathology.  These were small in size.  There was no other grossly palpable lymph node in the immediate area.  The pathologist did call with frozen section report demonstrating metastasis in the first sentinel lymph node.  Therefore decision was made to proceed with modified radical mastectomy.   After completing skin flaps dissection was carried out up into the axilla as far as the axillary vein and dissected out the axillary contents.  The intercostal brachial nerve was identified and left intact.  Multiple clamped vessels were ligated with 3-0 Vicryl.  The lateral end of the skin ellipse was tagged with a stitch.  The breast with axillary contents was submitted fresh for routine pathology.  The wound was inspected and several small bleeding points were cauterized.  The wound was irrigated with warm saline solution.  Two   19 French Blake drains were inserted through inferior stab wounds.  One was placed along the anterior chest wall and the other placed into the axilla.  These were sutured to the skin with 3-0 nylon.  There was significant redundancy found in the skin at the lateral end of the skin ellipse and elected to modify the closure with excision of 2 V-shaped segments of tissue above and below the incision.  Each of these resected segments were approximately 5 cm in dimension.  This was at the lateral end of the incision so that the closure had the shape of a T.  The wound was closed with a running 4-0 Monocryl subcuticular suture and Dermabond.  The drains were activated seeing a small amount of serosanguineous drainage.  The patient tolerated the surgery satisfactorily and was then prepared for transfer to the recovery room  Rochel Brome MD drainage of serosanguineous fluid

## 2017-10-26 NOTE — Anesthesia Postprocedure Evaluation (Signed)
Anesthesia Post Note  Patient: Dominique Guzman  Procedure(s) Performed: MASTECTOMY WITH SENTINEL LYMPH NODE BIOPSY (Left ) MASTECTOMY MODIFIED RADICAL (Left )  Patient location during evaluation: PACU Anesthesia Type: General Level of consciousness: awake and alert Pain management: pain level controlled Vital Signs Assessment: post-procedure vital signs reviewed and stable Respiratory status: spontaneous breathing and respiratory function stable Cardiovascular status: stable Anesthetic complications: no     Last Vitals:  Vitals:   10/26/17 1655 10/26/17 1720  BP: (!) 132/58 (!) 131/50  Pulse: 96 96  Resp: 20 17  Temp: 36.6 C 36.7 C  SpO2: 93% 96%    Last Pain:  Vitals:   10/26/17 1724  TempSrc:   PainSc: 2                  Lesley Galentine K

## 2017-10-26 NOTE — Anesthesia Post-op Follow-up Note (Signed)
Anesthesia QCDR form completed.        

## 2017-10-26 NOTE — Anesthesia Procedure Notes (Signed)
Procedure Name: Intubation Date/Time: 10/26/2017 11:43 AM Performed by: Johnna Acosta, CRNA Pre-anesthesia Checklist: Patient identified, Emergency Drugs available, Suction available, Patient being monitored and Timeout performed Patient Re-evaluated:Patient Re-evaluated prior to induction Oxygen Delivery Method: Circle system utilized Preoxygenation: Pre-oxygenation with 100% oxygen Induction Type: IV induction Ventilation: Mask ventilation without difficulty and Oral airway inserted - appropriate to patient size Laryngoscope Size: Sabra Heck and 2 Grade View: Grade II Tube type: Oral Tube size: 7.0 mm Number of attempts: 1 Airway Equipment and Method: Stylet Placement Confirmation: ETT inserted through vocal cords under direct vision,  positive ETCO2 and breath sounds checked- equal and bilateral Secured at: 21 cm Tube secured with: Tape Dental Injury: Teeth and Oropharynx as per pre-operative assessment  Difficulty Due To: Difficulty was anticipated, Difficult Airway- due to large tongue and Difficult Airway- due to limited oral opening Future Recommendations: Recommend- induction with short-acting agent, and alternative techniques readily available

## 2017-10-26 NOTE — H&P (Signed)
  She comes in prepared for left mastectomy for multifocal carcinoma of the left breast.  She has completed neoadjuvant chemotherapy she has had preoperative injection of radioactive technetium sulfur colloid.  Both were anticipating sentinel lymph node biopsies.  Possible modified radical mastectomy.  She reports no change in overall condition since the recent office exam.  Lab work reviewed  The left side was marked YES  I discussed with her the plan for surgery.  Also discussed postoperative care

## 2017-10-26 NOTE — Transfer of Care (Signed)
Immediate Anesthesia Transfer of Care Note  Patient: Dominique Guzman  Procedure(s) Performed: MASTECTOMY WITH SENTINEL LYMPH NODE BIOPSY (Left ) MASTECTOMY MODIFIED RADICAL (Left )  Patient Location: PACU  Anesthesia Type:General  Level of Consciousness: sedated  Airway & Oxygen Therapy: Patient Spontanous Breathing and Patient connected to face mask oxygen  Post-op Assessment: Report given to RN and Post -op Vital signs reviewed and stable  Post vital signs: Reviewed and stable  Last Vitals:  Vitals:   10/26/17 1024 10/26/17 1541  BP: (!) 144/81 (!) 82/56  Pulse: 97 92  Resp: 17 (!) 29  Temp: (!) 36.3 C 36.7 C  SpO2: 98% 98%    Last Pain:  Vitals:   10/26/17 1024  TempSrc: Temporal         Complications: No apparent anesthesia complications

## 2017-10-26 NOTE — Op Note (Signed)
OPERATIVE REPORT  PREOPERATIVE  DIAGNOSIS: .  Left breast cancer  POSTOPERATIVE DIAGNOSIS: .  Left breast cancer  PROCEDURE: .  Left axillary sentinel lymph node biopsy, left modified radical mastectomy  ANESTHESIA:  General  SURGEON: Rochel Brome  MD   ASSISTANT SURGEON:  Herbert Pun   INDICATIONS: .  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  She has had neoadjuvant chemotherapy.  She had preoperative injection of radioactive technetium sulfur colloid.  With the patient on the operating table in the supine position under general anesthesia arms were placed on the lateral arm supports.  The left breast and surrounding chest wall were prepared with ChloraPrep and draped in a sterile manner.  A transversely obliquely oriented elliptical incision was made above and below the breast.  Electrocautery was used for hemostasis.  3-0 silk sutures were placed along the skin edges for traction.  Skin and subcutaneous flaps were raised in the direction of the clavicle, medially to the sternum inferiorly to the inferior mammary fold and laterally to the latissimus dorsi muscle.  It is noted that during the course of the surgery multiple clamped bleeding points were suture ligated with 3-0 chromic.  Dissection was carried out in the axilla and studied the axilla with a gamma counter demonstrating the location of radioactivity.  A sentinel lymph node was encountered in the inferior aspect of the axilla and removed and was about 6 mm in size and submitted for frozen section.  With additional study with a gamma counter two other sentinel lymph nodes were found and removed and placed in a second container for pathology.  These were small in size.  There was no other grossly palpable lymph node in the immediate area.  The pathologist did call with frozen section report demonstrating metastasis in the first sentinel lymph node.  Therefore decision  was made to proceed with modified radical mastectomy.  After completing skin flaps dissection was carried out up into the axilla as far as the axillary vein and dissected out the axillary contents.  The intercostal brachial nerve was identified and left intact.  Multiple clamped vessels were ligated with 3-0 Vicryl.  The lateral end of the skin ellipse was tagged with a stitch.  The breast with axillary contents was submitted fresh for routine pathology.  The wound was inspected and several small bleeding points were cauterized.  The wound was irrigated with warm saline solution.  Two   19 French Blake drains were inserted through inferior stab wounds.  One was placed along the anterior chest wall and the other placed into the axilla.  These were sutured to the skin with 3-0 nylon.  There was significant redundancy found in the skin at the lateral end of the skin ellipse and elected to modify the closure with excision of 2 V-shaped segments of tissue above and below the incision.  Each of these resected segments were approximately 5 cm in dimension.  This was at the lateral end of the incision so that the closure had the shape of a T.  The wound was closed with a running 4-0 Monocryl subcuticular suture and Dermabond.  The drains were activated seeing a small amount of serosanguineous drainage.  The patient tolerated the surgery satisfactorily and was then prepared for transfer to the recovery room  Rochel Brome MD

## 2017-10-27 ENCOUNTER — Encounter: Payer: Self-pay | Admitting: Surgery

## 2017-10-27 DIAGNOSIS — C50912 Malignant neoplasm of unspecified site of left female breast: Secondary | ICD-10-CM | POA: Diagnosis not present

## 2017-10-27 LAB — GLUCOSE, CAPILLARY
Glucose-Capillary: 222 mg/dL — ABNORMAL HIGH (ref 65–99)
Glucose-Capillary: 182 mg/dL — ABNORMAL HIGH (ref 65–99)

## 2017-10-27 MED ORDER — HYDROCODONE-ACETAMINOPHEN 5-325 MG PO TABS: 1.0000 | ORAL_TABLET | Freq: Four times a day (QID) | ORAL | 0 refills | Status: DC | PRN

## 2017-10-27 NOTE — Discharge Instructions (Addendum)
Take Tylenol or Norco if needed for pain.  Should not drive or do anything dangerous when taking Norco.  Empty drains 1- 2 times per day and as needed and record drainage.  Keep dressing dry.  Gradually increase activities.  Avoid needle sticks and blood pressure checks in the left arm.  Call surgery office next week to report the amount of drainage and schedule a follow-up appointment.

## 2017-10-27 NOTE — Discharge Summary (Signed)
  DISCHARGE SUMMARY  Diagnosis: Multifocal carcinoma of the left breast with lymph node metastasis  Operative procedure: Sentinel lymph node biopsy and left modified radical mastectomy.  She was brought in through the outpatient surgery department for surgery and kept overnight for a period of observation.  She did have some elevation of her blood sugar which was monitored.  She has had instruction in how to empty her drains.  She has been walking.  She is tolerating her diet satisfactorily.  Her wound appears to be healing satisfactorily.  Drains are functioning satisfactorily.  Discharge medicines include Norco  Instructions were given and plans made for follow-up

## 2017-10-29 LAB — HIV ANTIBODY (ROUTINE TESTING W REFLEX): HIV SCREEN 4TH GENERATION: NONREACTIVE

## 2017-10-30 LAB — SURGICAL PATHOLOGY

## 2017-11-01 ENCOUNTER — Other Ambulatory Visit: Payer: Self-pay

## 2017-11-08 NOTE — Progress Notes (Signed)
[facility] Follow Up Visit.  Patient Care Team: Glendon Axe, MD as PCP - General (Internal Medicine)  Reason for visit Follow up for treatment of breast cancer.   Pertinent Oncology History [name] 67 y.o. female with PMH listed as below is referred by Dr.Schermerhorn to [name] for evaluation and management of newly diagnosed breast cancer. Patient had a history a left breast mass biopsy followed by breast excisonal biopsy in 2015 with benign pathology.  She also has a history of Uterine cancer which was treated with RT with Dr.Chrystal.  She underwent routine screening mammogram on 04/17/2017 which recommended a possible mass on the left breast. Left diagnostic mammogram showed a highly suspicious left breast mass at 4 o'clock, 7 cm from the nipple and 1 o'clock,,  About 3 cm from the nipple. Enlarged left axillary lymph node suspicious for metastatic disease.  1 US breast confirmed a left breast mass of 2.1cm, 4 o'clock, and 1.1 cm mass at 1 o'clock.The masses at 1 and 4 o'clock are separated by approximately 4 cm sonographically 2 Biopsy of the two mass and axillary lymph node revealed invasive carcinoma, grade 2. Both are ER,PR positive. HER2 equivocal, FISH pending. 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  3 05/14/2017 MRI breast showed Two sites of biopsy proven malignancy seen in the left breast, which span up to 11.3 cm. There are intervening areas of non mass enhancement and a small suspicious enhancing mass. 4 Case was discussed on breast tumor board on 05/21/2017 and the panel agree with neoadjuvant ddAC to T followed by surgery. Clinically [id number]  # Patient completed s/p 4 cycles ddAC and weekly taxol x 12, on 10/26/2017 patient underwent that surgery.  Left axillary sentinel lymph node was positive, so Dr. Tamala Julian proceeded with left mastectomy and left axillary lymph node dissection. Pathology showed ypT2(m) pN1a, ER/PR positive, HER2 FISH  negative, grade 2, negative margin, invasive mammary carcinoma.    INTERVAL HISTORY Patient presents after recent mastectomy and axillary lymph node dissection.  she is accompanied by her husband.  Surgical wound still sore, feels well.   Denies any fever, chills, mouth sores, nausea or vomiting. Denies numbness or tingling.    Review of Systems  Constitutional: Negative for fatigue.  HENT:   Negative for hearing loss and lump/mass.   Eyes: Negative for eye problems.  Respiratory: Negative for cough and hemoptysis.   Cardiovascular: Negative for chest pain and leg swelling.  Gastrointestinal: Negative for abdominal distention, abdominal pain and blood in stool.  Endocrine: Negative for hot flashes.  Genitourinary: Negative for difficulty urinating and dysuria.   Musculoskeletal: Negative for arthralgias, back pain and gait problem.  Skin: Positive for wound. Negative for rash.       Left modified radical mastectomy wound with drainage tubes x 2  Neurological: Negative for dizziness, gait problem and numbness.       Finger tip sensitivity resolved.  Hematological: Negative for adenopathy.  Psychiatric/Behavioral: Negative for confusion. The patient is not nervous/anxious.    [name]   MEDICAL HISTORY: Past Medical History:  Diagnosis Date  . Anemia   . Arthritis    HIP-LEFT  . Benign neoplasm of colon   . Breast cancer ([place])   . Cancer ([place])   . Diabetes mellitus without complication ([place])   . H/O osteopenia   . History of hiatal hernia   . Hypertension   . Sleep apnea    USE CPAP  . Uterine cancer ([name])  SURGICAL HISTORY: Past Surgical History:  Procedure Laterality Date  . ABDOMINAL HYSTERECTOMY    . BREAST BIOPSY Left 04/2014   negative  . BREAST EXCISIONAL BIOPSY Left 2015  . CESAREAN SECTION     X2  . COLONOSCOPY WITH PROPOFOL N/A 04/15/2015   Procedure: COLONOSCOPY WITH PROPOFOL;  Surgeon: Hulen Luster, MD;  Location: [facility];  Service: Gastroenterology;   Laterality: N/A;  . HERNIA REPAIR    . MASTECTOMY MODIFIED RADICAL Left 10/26/2017   Procedure: MASTECTOMY MODIFIED RADICAL;  Surgeon: Leonie Green, MD;  Location: [facility] OR[facility];  Service: General;  Laterality: Left;  [name] MASTECTOMY W/ SENTINEL NODE BIOPSY Left 10/26/2017   Procedure: MASTECTOMY WITH SENTINEL LYMPH NODE BIOPSY;  Surgeon: Leonie Green, MD;  Location: [facility] OR[facility];  Service: General;  Laterality: Left;  . PORTACATH PLACEMENT Right 05/22/2017   Procedure: INSERTION PORT-A-CATH;  Surgeon: Leonie Green, MD;  Location: [facility] ORS;  Service: General;  Laterality: Right;    SOCIAL HISTORY: Social History   Socioeconomic History  . Marital status: Married    Spouse name: Not on file  . Number of children: Not on file  . Years of education: Not on file  . Highest education level: Not on file  Social Needs  . Financial resource strain: Not on file  . Food insecurity - worry: Not on file  . Food insecurity - inability: Not on file  . Transportation needs - medical: Not on file  . Transportation needs - non-medical: Not on file  Occupational History  . Not on file  Tobacco Use  . Smoking status: Never Smoker  . Smokeless tobacco: Never Used  Substance and Sexual Activity  . Alcohol use: No  . Drug use: No  . Sexual activity: Not on file  Other Topics Concern  . Not on file  Social History Narrative  . Not on file    FAMILY HISTORY Family History  Problem Relation Age of Onset  . Diabetes Mother   . Diabetes Father   . Diabetes Sister   . Breast cancer Neg Hx     ALLERGIES:  has No Known Allergies.  MEDICATIONS:  Current Outpatient Medications  Medication Sig Dispense Refill  . Calcium Carb-Cholecalciferol (CALCIUM 600-D PO) Take 1 tablet by mouth daily.    . chlorhexidine (PERIDEX) 0.12 % solution USE AS DIRECTED 15 ML IN THE MOUTH OR THROAT TWICE A DAY (Patient taking differently: USE AS DIRECTED 15 ML IN THE MOUTH OR THROAT TWICE A DAY AS NEEDED  FOR MOUTH SORES) 473 mL 0  . Cinnamon 500 MG capsule Take 1,000 mg by mouth daily.     . cyanocobalamin 1000 MCG tablet Take 1,000 mcg by mouth daily.    . diphenhydrAMINE (BENADRYL) 25 MG tablet Take 25 mg by mouth every 6 (six) hours as needed for allergies.     [name] FIBER PO Take 2 each by mouth daily.     . fluticasone (FLONASE) 50 MCG/ACT nasal spray Place 2 sprays into both nostrils daily as needed for allergies.     [name] GLUCOSAMINE-CHONDROITIN PO Take 1 tablet by mouth daily.    [name] HYDROcodone-acetaminophen (NORCO/VICODIN) 5-325 MG tablet Take 1-2 tablets by mouth every 6 (six) hours as needed for moderate pain. 16 tablet 0  . lidocaine-prilocaine (EMLA) cream Apply 1 application topically as needed. Apply small amount to port site at least 1 hour prior to it being accessed, cover with plastic wrap 30 g 3  . lisinopril-hydrochlorothiazide (PRINZIDE,ZESTORETIC) 20-12.5  MG per tablet Take 1 tablet by mouth daily.    [name] loperamide (IMODIUM) 2 MG capsule Take 1 capsule (2 mg total) by mouth See admin instructions. With onset of loose stool, take 33m followed by 234mevery 2 hours until 12 hours have passed without loose bowel movement. Maximum: 16 mg/day 120 capsule 1  . metFORMIN (GLUCOPHAGE) 500 MG tablet Take 1,000 mg by mouth 2 (two) times daily.     . Multiple Vitamin (MULTIVITAMIN) tablet Take 1 tablet by mouth daily.    . ondansetron (ZOFRAN) 8 MG tablet Take 1 tablet (8 mg total) by mouth every 8 (eight) hours as needed for nausea or vomiting. 120 tablet 0  . pyridOXINE (VITAMIN B-6) 100 MG tablet Take 1 tablet (100 mg total) by mouth daily. 30 tablet 2  . sitaGLIPtin (JANUVIA) 100 MG tablet Take 100 mg by mouth daily.    . [name] Kitchenriamcinolone cream (KENALOG) 0.1 % Apply 1 application topically daily as needed (for rash).    . valACYclovir (VALTREX) 1000 MG tablet Take 1 tablet (1,000 mg total) by mouth 2 (two) times daily. 14 tablet 0   No current facility-administered medications for this visit.      PHYSICAL EXAMINATION:  ECOG PERFORMANCE STATUS: 1 - Symptomatic but completely ambulatory  Vitals:   11/09/17 1608  BP: 108/66  Pulse: 90  Temp: 99 F (37.2 C)    Filed Weights   11/09/17 0953 11/09/17 1608  Weight: 193 lb 2 oz (87.6 kg) 193 lb 2 oz (87.6 kg)     Physical Exam  Constitutional: She is oriented to person, place, and time and well-developed, well-nourished, and in no distress.  HENT:  Head: Normocephalic and atraumatic.  Mouth/Throat: Oropharynx is clear and moist.  Eyes: Conjunctivae and EOM are normal. Pupils are equal, round, and reactive to light. Right eye exhibits no discharge.  Neck: Normal range of motion. Neck supple.  Cardiovascular: Normal rate, regular rhythm and normal heart sounds. Exam reveals no gallop and no friction rub.  No murmur heard. Pulmonary/Chest: Effort normal and breath sounds normal. No respiratory distress. She has no rales. She exhibits no tenderness.  Abdominal: Soft. Bowel sounds are normal. She exhibits no distension. There is no tenderness. There is no rebound and no guarding.  Musculoskeletal: Normal range of motion. She exhibits no edema or tenderness.  Lymphadenopathy:    She has no cervical adenopathy.  Neurological: She is alert and oriented to person, place, and time.  Skin: Skin is warm and dry. She is not diaphoretic. No erythema.  Psychiatric: Affect and judgment normal.  Breast exam was performed in seated position. S/p left modified radical mastectomy, healing scar, no erythema. Drainage tubes x 2.   ECOG 1  LABORATORY DATA: CBC Latest Ref Rng & Units 10/03/2017 09/26/2017 09/19/2017  WBC 3.6 - 11.0 K/uL 6.3 6.6 6.8  Hemoglobin 12.0 - 16.0 g/dL 9.5(L) 9.9(L) 9.8(L)  Hematocrit 35.0 - 47.0 % 27.6(L) 28.5(L) 28.4(L)  Platelets 150 - 440 K/uL 330 367 360   CMP Latest Ref Rng & Units 10/03/2017 09/26/2017 09/19/2017  Glucose 65 - 99 mg/dL 191(H) 168(H) 180(H)  BUN 6 - 20 mg/dL _0 Creatinine 0.44 -  1.00 mg/dL 0.56 0.49 0.59  Sodium 135 - 145 mmol/L 134(L) 132(L) 129(L)  Potassium 3.5 - 5.1 mmol/L 4.3 4.2 4.0  Chloride 101 - 111 mmol/L 102 100(L) 96(L)  CO2 22 - 32 mmol/L _1 Calcium 8.9 - 10.3 mg/dL 8.9 9.6 9.5  Total Protein 6.5 - 8.1 g/dL 6.3(L) 6.4(L) 6.8  Total Bilirubin 0.3 - 1.2 mg/dL 0.5 0.5 0.5  Alkaline Phos 38 - 126 U/L 69 82 70  AST 15 - 41 U/L _0 ALT 14 - 54 U/L _1 Normal  B12 and Folate. Iron panel was checked in September showed no signs of iron deficiency  PATHOLOGY 10/27/2017  Surgical Pathology  CASE: [id number]  PATIENT: [facility]  Surgical Pathology Report  SPECIMEN SUBMITTED:  A. Sentinel lymph node #1, left axilla  B. Sentinel lymph nodes #2 and 3, left axilla  C. Breast, left; stitch outer end of skin ellipse, and axillary lymph  nodes  DIAGNOSIS:  A. SENTINEL LYMPH NODE #1, LEFT AXILLA; EXCISION:  - METASTATIC MAMMARY CARCINOMA, ONE LYMPH NODE, 5 MM METASTASIS (1/1).   B. SENTINEL LYMPH NODES #2 AND #3, LEFT AXILLA; EXCISION:  - NEGATIVE FOR MALIGNANCY, THREE LYMPH NODES (0/3).   C. BREAST, LEFT; MODIFIED RADICAL MASTECTOMY:  - INVASIVE MAMMARY CARCINOMA, TWO SITES, SEE SUMMARY BELOW.  - BIOPSY SITE CHANGES AT TWO SITES; ONE MARKER CLIP FOUND.  - METASTATIC MAMMARY CARCINOMA IN TWO OF FOUR AXILLARY LYMPH NODES, WITH  LARGER METASTASIS (8 MM) LOCATED IN NODE WITH MARKER CLIP (2/4).   Comment:  ER, PR, and HER2 were assessed on the previous core biopsies  ([id number], 04/25/2017) with results summarized as follows:  ER IHC (SP1): Both tumors positive, >90%, strong staining  PR IHC (IE2): Both tumors positive, 1:00 tumor 51-90%, mod to strong,  and 4:00 tumor >90% with strong staining  HER2 IHC (HercepTest): Both tumors equivocal, score 2+  HER2 FISH (DAKO IQFISH PharmDX): Both tumors negative (not amplified)   Surgical Pathology Cancer Case Summary  INVASIVE CARCINOMA OF THE BREAST:  Procedure: Total  mastectomy  Specimen Laterality: Left  Histologic Type: Invasive carcinoma of no special type  Histologic Grade ([place] Histologic Score)       Glandular (Acinar)/Tubular Differentiation: Score 3       Nuclear Pleomorphism: Score 2       Mitotic Rate: Score 1       Overall Grade: Grade 2  Tumor Size: 23 mm (4-5:00 posterior tumor); 12 mm (1:00 tumor)  Ductal Carcinoma In Situ (DCIS): Present  DCIS Nuclear Grade: Intermediate grade  Extensive intraductal component: Negative for extensive intraductal  component  Margins:       Invasive carcinoma margins: Uninvolved by invasive carcinoma       Distance from closest margin: 0.2 mm, deep       DCIS margins: Uninvolved by DCIS       Distance from closest margin: Cannot be determined; margins  not present in sections containing DCIS  Regional Lymph nodes:    Total # lymph nodes examined: 8    # Sentinel lymph nodes examined: 4    # Lymph nodes with macrometastasis (>2.0 mm): 2    # Lymph nodes with isolated tumor cells (<0.2 mm): 0    # Lymph nodes with micrometastasis (> 0.2 mm and < 2.0 mm): 1    Extranodal extension: Present  Treatment Effect:       In the breast: Definite response to presurgical therapy in the invasive carcinoma       In the lymph nodes: No definite response to presurgical therapy in metastatic carcinoma  Residual Cancer Burden (RCB):    Primary Tumor Bed  Primary tumor bed:23 mm x 11 mm    Overall cancer cellularity: 0.5%  Percentage of cancer that is in situ: 0%     Lymph nodes    # of lymph nodes positive for metastasis: 3    Diameter of largest metastasis: 8 mm    Residual Cancer Burden (RCB): 2.553       Residual Cancer Burden Class: RCB-II  Lymphovascular Invasion: Cannot be determined with extensive retraction  changes  Pathologic Stage Classification (pTNM, AJCC 8th Edition): ypT2(m) pN1a  TNM  Descriptors: y (posttreatment); m (multiple foci of invasive  carcinoma)    ASSESSMENT/PLAN 67 y.o. 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  S.p post left modified radical mastectomy with ALND.   1. Malignant neoplasm of upper-outer quadrant of left breast in female, estrogen receptor positive ([place])   2. Malignant neoplasm of lower-outer quadrant of left breast of female, estrogen receptor positive ([place])   3. Breast cancer metastasized to axillary lymph node, left ([place])   4. History of uterine cancer    # ypT2N1a M0 disease, ER+PR+, HER2-.  # Follow up with Dr. Tamala Julian for wound care.  # refer to Dr.Chrystal for evaluation of adjuvant RT .  # After completion of RT, she may be eligible for phase III clinic trial with ribociclib with endocrine therapy as adjuvant treatment. Has referred her to research department. Patient is interested.   # Port flush in 3 weeks. After she finishes all her treatment, She prefers to take out the port.  will send to surgeon to have Medi port taken out.    # She meets criteria for Genetic testing, two breast cancer primaries.  Follow up in 2 weeks for further discussion about adjuvant plan.  All questions were answered. The patient knows to call the clinic with any problems, questions or concerns.  Earlie Server, MD, PhD Hematology Oncology [facility] Pager- [phone] 11/08/2017

## 2017-11-09 ENCOUNTER — Inpatient Hospital Stay: Payer: BLUE CROSS/BLUE SHIELD | Attending: Oncology

## 2017-11-09 ENCOUNTER — Inpatient Hospital Stay (HOSPITAL_BASED_OUTPATIENT_CLINIC_OR_DEPARTMENT_OTHER): Payer: BLUE CROSS/BLUE SHIELD | Admitting: Oncology

## 2017-11-09 ENCOUNTER — Encounter: Payer: Self-pay | Admitting: Oncology

## 2017-11-09 ENCOUNTER — Other Ambulatory Visit: Payer: Self-pay

## 2017-11-09 VITALS — BP 108/66 | HR 90 | Temp 99.0°F | Wt 193.1 lb

## 2017-11-09 DIAGNOSIS — Z923 Personal history of irradiation: Secondary | ICD-10-CM | POA: Diagnosis not present

## 2017-11-09 DIAGNOSIS — G8918 Other acute postprocedural pain: Secondary | ICD-10-CM

## 2017-11-09 DIAGNOSIS — I10 Essential (primary) hypertension: Secondary | ICD-10-CM

## 2017-11-09 DIAGNOSIS — Z79899 Other long term (current) drug therapy: Secondary | ICD-10-CM

## 2017-11-09 DIAGNOSIS — Z9989 Dependence on other enabling machines and devices: Secondary | ICD-10-CM | POA: Diagnosis not present

## 2017-11-09 DIAGNOSIS — C773 Secondary and unspecified malignant neoplasm of axilla and upper limb lymph nodes: Secondary | ICD-10-CM

## 2017-11-09 DIAGNOSIS — E1140 Type 2 diabetes mellitus with diabetic neuropathy, unspecified: Secondary | ICD-10-CM

## 2017-11-09 DIAGNOSIS — Z7984 Long term (current) use of oral hypoglycemic drugs: Secondary | ICD-10-CM

## 2017-11-09 DIAGNOSIS — M1990 Unspecified osteoarthritis, unspecified site: Secondary | ICD-10-CM | POA: Insufficient documentation

## 2017-11-09 DIAGNOSIS — C50512 Malignant neoplasm of lower-outer quadrant of left female breast: Secondary | ICD-10-CM | POA: Diagnosis not present

## 2017-11-09 DIAGNOSIS — Z170 Estrogen receptor positive status [ER+]: Secondary | ICD-10-CM

## 2017-11-09 DIAGNOSIS — C50912 Malignant neoplasm of unspecified site of left female breast: Secondary | ICD-10-CM

## 2017-11-09 DIAGNOSIS — G4730 Sleep apnea, unspecified: Secondary | ICD-10-CM | POA: Insufficient documentation

## 2017-11-09 DIAGNOSIS — Z5111 Encounter for antineoplastic chemotherapy: Secondary | ICD-10-CM

## 2017-11-09 DIAGNOSIS — M8580 Other specified disorders of bone density and structure, unspecified site: Secondary | ICD-10-CM

## 2017-11-09 DIAGNOSIS — C50412 Malignant neoplasm of upper-outer quadrant of left female breast: Secondary | ICD-10-CM | POA: Diagnosis present

## 2017-11-09 DIAGNOSIS — Z8542 Personal history of malignant neoplasm of other parts of uterus: Secondary | ICD-10-CM

## 2017-11-09 DIAGNOSIS — Z9012 Acquired absence of left breast and nipple: Secondary | ICD-10-CM | POA: Insufficient documentation

## 2017-11-09 DIAGNOSIS — Z452 Encounter for adjustment and management of vascular access device: Secondary | ICD-10-CM | POA: Insufficient documentation

## 2017-11-09 LAB — COMPREHENSIVE METABOLIC PANEL
ALBUMIN: 3.3 g/dL — AB (ref 3.5–5.0)
ALT: 13 U/L — AB (ref 14–54)
AST: 19 U/L (ref 15–41)
Alkaline Phosphatase: 72 U/L (ref 38–126)
Anion gap: 11 (ref 5–15)
BUN: 13 mg/dL (ref 6–20)
CHLORIDE: 97 mmol/L — AB (ref 101–111)
CO2: 23 mmol/L (ref 22–32)
CREATININE: 0.63 mg/dL (ref 0.44–1.00)
Calcium: 9.2 mg/dL (ref 8.9–10.3)
GFR calc Af Amer: >60 mL/min (ref >60)
GLUCOSE: 189 mg/dL — AB (ref 65–99)
POTASSIUM: 3.9 mmol/L (ref 3.5–5.1)
SODIUM: 131 mmol/L — AB (ref 135–145)
Total Bilirubin: 0.4 mg/dL (ref 0.3–1.2)
Total Protein: 6.4 g/dL — ABNORMAL LOW (ref 6.5–8.1)

## 2017-11-09 LAB — CBC WITH DIFFERENTIAL/PLATELET
Basophils Absolute: 0 10*3/uL (ref 0–0.1)
Basophils Relative: 0 %
EOS ABS: 0.4 10*3/uL (ref 0–0.7)
EOS PCT: 3 %
HCT: 30.9 % — ABNORMAL LOW (ref 35.0–47.0)
Hemoglobin: 10.1 g/dL — ABNORMAL LOW (ref 12.0–16.0)
LYMPHS ABS: 0.9 10*3/uL — AB (ref 1.0–3.6)
LYMPHS PCT: 7 %
MCH: 28.6 pg (ref 26.0–34.0)
MCHC: 32.8 g/dL (ref 32.0–36.0)
MCV: 87 fL (ref 80.0–100.0)
MONO ABS: 1.2 10*3/uL — AB (ref 0.2–0.9)
Monocytes Relative: 9 %
Neutro Abs: 11.7 10*3/uL — ABNORMAL HIGH (ref 1.4–6.5)
Neutrophils Relative %: 81 %
PLATELETS: 345 10*3/uL (ref 150–440)
RBC: 3.55 MIL/uL — AB (ref 3.80–5.20)
RDW: 15.9 % — ABNORMAL HIGH (ref 11.5–14.5)
WBC: 14.2 10*3/uL — ABNORMAL HIGH (ref 3.6–11.0)

## 2017-11-22 ENCOUNTER — Ambulatory Visit
Admission: RE | Admit: 2017-11-22 | Discharge: 2017-11-22 | Disposition: A | Discharge status: Home / Self Care | Payer: BLUE CROSS/BLUE SHIELD | Source: Ambulatory Visit | Attending: Radiation Oncology | Admitting: Radiation Oncology

## 2017-11-22 ENCOUNTER — Other Ambulatory Visit: Payer: Self-pay

## 2017-11-22 ENCOUNTER — Encounter: Payer: Self-pay | Admitting: Radiation Oncology

## 2017-11-22 VITALS — BP 146/86 | HR 93 | Temp 98.0°F | Resp 18 | Wt 196.5 lb

## 2017-11-22 DIAGNOSIS — Z9012 Acquired absence of left breast and nipple: Secondary | ICD-10-CM | POA: Diagnosis not present

## 2017-11-22 DIAGNOSIS — Z7984 Long term (current) use of oral hypoglycemic drugs: Secondary | ICD-10-CM | POA: Insufficient documentation

## 2017-11-22 DIAGNOSIS — Z8542 Personal history of malignant neoplasm of other parts of uterus: Secondary | ICD-10-CM | POA: Insufficient documentation

## 2017-11-22 DIAGNOSIS — E119 Type 2 diabetes mellitus without complications: Secondary | ICD-10-CM | POA: Insufficient documentation

## 2017-11-22 DIAGNOSIS — K449 Diaphragmatic hernia without obstruction or gangrene: Secondary | ICD-10-CM | POA: Insufficient documentation

## 2017-11-22 DIAGNOSIS — M129 Arthropathy, unspecified: Secondary | ICD-10-CM | POA: Diagnosis not present

## 2017-11-22 DIAGNOSIS — I10 Essential (primary) hypertension: Secondary | ICD-10-CM | POA: Insufficient documentation

## 2017-11-22 DIAGNOSIS — D649 Anemia, unspecified: Secondary | ICD-10-CM | POA: Diagnosis not present

## 2017-11-22 DIAGNOSIS — Z79899 Other long term (current) drug therapy: Secondary | ICD-10-CM | POA: Diagnosis not present

## 2017-11-22 DIAGNOSIS — M8580 Other specified disorders of bone density and structure, unspecified site: Secondary | ICD-10-CM | POA: Diagnosis not present

## 2017-11-22 DIAGNOSIS — C50812 Malignant neoplasm of overlapping sites of left female breast: Secondary | ICD-10-CM

## 2017-11-22 DIAGNOSIS — G4730 Sleep apnea, unspecified: Secondary | ICD-10-CM | POA: Insufficient documentation

## 2017-11-22 DIAGNOSIS — Z90710 Acquired absence of both cervix and uterus: Secondary | ICD-10-CM | POA: Insufficient documentation

## 2017-11-22 DIAGNOSIS — Z170 Estrogen receptor positive status [ER+]: Secondary | ICD-10-CM | POA: Insufficient documentation

## 2017-11-22 NOTE — Consult Note (Signed)
NEW PATIENT EVALUATION  Name: Dominique Guzman  MRN: [id number]  Date:   11/22/2017     DOB: 18-Jul-1951   This 67 y.o. female patient presents to the clinic for initial evaluation of chest wall peripheral lymphatic radiation status post left modified radical mastectomy for. 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 Breast Cancer    Initial Evaluation    DIAGNOSIS: The encounter diagnosis was Malignant neoplasm of overlapping sites of left breast in female, estrogen receptor positive ([place]).   PREVIOUS INVESTIGATIONS:  Pathology reports reviewed Mammograms and ultrasound reviewed Clinical notes reviewed  HPI: Patient is a 67 year old female well known to our department having been treated approximately 12 years prior of for endometrial carcinoma. She was found by her gynecologist to have a left breast mass and underwent screening mammogram in July 2018 showing a left breast mass at the 4:00 position 7 some addition the nipple. There was also abnormal enlarged axillary lymph node measuring cortical thickening. The mass there were actually 2 masses in the breast one at 1:00 one at 4:00 separate approximate 4 cm sonographically. This prompted ultrasound-guided biopsy showing both areas containing invasive mammary carcinoma and also metastatic disease in one axillary lymph node. Tumor was ER/PR positive HER-2/neu negative. MRI scan of the breast showed again to biopsy-proven sites of malignancy in the left breast spanning 11.3 cm. Case was discussed at breast cancer conference with clinical stage TIII N1 M0. She underwent neoadjuvant chemotherapy with Cytoxan and Adriamycin and Taxol. She then went on to have a left modified radical mastectomy showing missed mixed response new adjuvant therapy with metastatic disease in 3  of 5 axillary lymph nodes. Tumor still measured 2.3 cm at the 4:00 position and 1.2 cm at 1:00 position. There was no extensive intraductal component. Margins were clear at 0.2 mm to the deep margin. She has done well postoperatively although a drain is still present with minimal drainage noted. She scheduled for the drain to be removed next week. She is now referred to radiation oncology for consideration and opinion regarding postmastectomy radiation. She's having no swelling of her left upper extremity no bone pain.  PLANNED TREATMENT REGIMEN: Left chest wall and peripheral lymphatic radiation  PAST MEDICAL HISTORY:  has a past medical history of Anemia, Arthritis, Benign neoplasm of colon, Breast cancer ([place]), Cancer ([place]), Diabetes mellitus without complication ([place]), H/O osteopenia, History of hiatal hernia, Hypertension, Sleep apnea, and Uterine cancer ([place]).    PAST SURGICAL HISTORY:  Past Surgical History:  Procedure Laterality Date  . ABDOMINAL HYSTERECTOMY    . BREAST BIOPSY Left 04/2014   negative  . BREAST EXCISIONAL BIOPSY Left 2015  . CESAREAN SECTION     X2  . COLONOSCOPY WITH PROPOFOL N/A 04/15/2015   Procedure: COLONOSCOPY WITH PROPOFOL;  Surgeon: Hulen Luster, MD;  Location: [facility];  Service: Gastroenterology;  Laterality: N/A;  . HERNIA REPAIR    . MASTECTOMY MODIFIED RADICAL Left 10/26/2017   Procedure: MASTECTOMY MODIFIED RADICAL;  Surgeon: Leonie Green, MD;  Location: [facility] ORS;  Service: General;  Laterality: Left;  [name] MASTECTOMY W/ SENTINEL NODE BIOPSY Left 10/26/2017   Procedure: MASTECTOMY WITH SENTINEL LYMPH NODE BIOPSY;  Surgeon: Leonie Green, MD;  Location: [facility] ORS;  Service: General;  Laterality: Left;  . PORTACATH PLACEMENT Right 05/22/2017   Procedure: INSERTION PORT-A-CATH;  Surgeon: Leonie Green, MD;  Location: [facility] ORS;  Service: General;  Laterality: Right;    FAMILY HISTORY: family history includes Diabetes in her father, mother,  and sister.  SOCIAL HISTORY:  reports that  has never smoked. she has never used smokeless tobacco. She reports that she does not drink alcohol or use drugs.  ALLERGIES: Patient has no known allergies.  MEDICATIONS:  Current Outpatient Medications  Medication Sig Dispense Refill  . Calcium Carb-Cholecalciferol (CALCIUM 600-D PO) Take 1 tablet by mouth daily.    . chlorhexidine (PERIDEX) 0.12 % solution USE AS DIRECTED 15 ML IN THE MOUTH OR THROAT TWICE A DAY (Patient taking differently: USE AS DIRECTED 15 ML IN THE MOUTH OR THROAT TWICE A DAY AS NEEDED FOR MOUTH SORES) 473 mL 0  . Cinnamon 500 MG capsule Take 1,000 mg by mouth daily.     . cyanocobalamin 1000 MCG tablet Take 1,000 mcg by mouth daily.    . diphenhydrAMINE (BENADRYL) 25 MG tablet Take 25 mg by mouth every 6 (six) hours as needed for allergies.     [name] FIBER PO Take 2 each by mouth daily.     . fluticasone (FLONASE) 50 MCG/ACT nasal spray Place 2 sprays into both nostrils daily as needed for allergies.     [name] GLUCOSAMINE-CHONDROITIN PO Take 1 tablet by mouth daily.    [name] HYDROcodone-acetaminophen (NORCO/VICODIN) 5-325 MG tablet Take 1-2 tablets by mouth every 6 (six) hours as needed for moderate pain. 16 tablet 0  . lidocaine-prilocaine (EMLA) cream Apply 1 application topically as needed. Apply small amount to port site at least 1 hour prior to it being accessed, cover with plastic wrap 30 g 3  . lisinopril-hydrochlorothiazide (PRINZIDE,ZESTORETIC) 20-12.5 MG per tablet Take 1 tablet by mouth daily.    [name] loperamide (IMODIUM) 2 MG capsule Take 1 capsule (2 mg total) by mouth See admin instructions. With onset of loose stool, take 59m followed by 267mevery 2 hours until 12 hours have passed without loose bowel movement. Maximum: 16 mg/day 120 capsule 1  . metFORMIN (GLUCOPHAGE) 500 MG tablet Take 1,000 mg by mouth 2 (two) times daily.     . Multiple Vitamin (MULTIVITAMIN) tablet Take 1 tablet by mouth daily.    . ondansetron (ZOFRAN)  8 MG tablet Take 1 tablet (8 mg total) by mouth every 8 (eight) hours as needed for nausea or vomiting. 120 tablet 0  . pyridOXINE (VITAMIN B-6) 100 MG tablet Take 1 tablet (100 mg total) by mouth daily. 30 tablet 2  . sitaGLIPtin (JANUVIA) 100 MG tablet Take 100 mg by mouth daily.    . [name] Kitchenriamcinolone cream (KENALOG) 0.1 % Apply 1 application topically daily as needed (for rash).    . valACYclovir (VALTREX) 1000 MG tablet Take 1 tablet (1,000 mg total) by mouth 2 (two) times daily. 14 tablet 0   No current facility-administered medications for this encounter.     ECOG PERFORMANCE STATUS:  0 - Asymptomatic  REVIEW OF SYSTEMS:  Patient denies any weight loss, fatigue, weakness, fever, chills or night sweats. Patient denies any loss of vision, blurred vision. Patient denies any ringing  of the ears or hearing loss. No irregular heartbeat. Patient denies heart murmur or history of fainting. Patient denies any chest pain or pain radiating to her upper extremities. Patient denies any shortness of breath, difficulty breathing at night, cough or hemoptysis. Patient denies any swelling in the lower legs. Patient denies any nausea vomiting, vomiting of blood, or coffee  ground material in the vomitus. Patient denies any stomach pain. Patient states has had normal bowel movements no significant constipation or diarrhea. Patient denies any dysuria, hematuria or significant nocturia. Patient denies any problems walking, swelling in the joints or loss of balance. Patient denies any skin changes, loss of hair or loss of weight. Patient denies any excessive worrying or anxiety or significant depression. Patient denies any problems with insomnia. Patient denies excessive thirst, polyuria, polydipsia. Patient denies any swollen glands, patient denies easy bruising or easy bleeding. Patient denies any recent infections, allergies or URI. Patient "s visual fields have not changed significantly in recent time.    PHYSICAL  EXAM: BP (!) 146/86   Pulse 93   Temp 98 F (36.7 C)   Resp 18   Wt 196 lb 8.6 oz (89.1 kg)   LMP  (LMP Unknown)   BMI 41.08 kg/m  She is status post left modified radical mastectomy. No evidence of mass or nodularity in the chest wall is noted. Right breast is free of dominant mass or nodularity in 2 positions examined. No axillary or supraclavicular adenopathy is appreciated. Well-developed well-nourished patient in NAD. HEENT reveals PERLA, EOMI, discs not visualized.  Oral cavity is clear. No oral mucosal lesions are identified. Neck is clear without evidence of cervical or supraclavicular adenopathy. Lungs are clear to A&P. Cardiac examination is essentially unremarkable with regular rate and rhythm without murmur rub or thrill. Abdomen is benign with no organomegaly or masses noted. Motor sensory and DTR levels are equal and symmetric in the upper and lower extremities. Cranial nerves II through XII are grossly intact. Proprioception is intact. No peripheral adenopathy or edema is identified. No motor or sensory levels are noted. Crude visual fields are within normal range.  LABORATORY DATA: Pathology reports reviewed    RADIOLOGY RESULTS: Mammograms ultrasound and MRI scans reviewed   IMPRESSION: Locally advanced invasive mammary carcinoma of the left breast status post neoadjuvant chemotherapy with residual invasive mammary carcinoma and lymph node involvement in 66 year old female poor prognostic factors include large size of lesion initially as well as limited axillary dissection and multiple positive lymph nodes.  PLAN: At this time based on the poor prognostic factors I would recommend adjuvant chest wall and peripheral fat radiation. Would treat both areas to 5040 cGy in 28 fractions. Would also boost her scar another 1600 cGy based on the close posterior margin. Risks and benefits of treatment including skin reaction fatigue alteration of blood counts possible inclusion of  superficial lung possible lymphedema of her left upper extremity all were discussed in detail with the patient. I would like the drain to be removed and I have tentatively set up CT simulation in about 2 weeks to allow more healing. Patient and her husband both seem to comprehend my treatment plan well. Patient also will be candidate for antiestrogen therapy after completion of radiation.There will be extra effort by both professional staff as well as technical staff to coordinate and manage concurrent chemoradiation and ensuing side effects during her treatments. Patient knows to call with any concerns.  I would like to take this opportunity to thank you for allowing me to participate in the care of your patient.Noreene Filbert, MD

## 2017-11-25 NOTE — Progress Notes (Signed)
[facility] Follow Up Visit.  Patient Care Team: Glendon Axe, MD as PCP - General (Internal Medicine)  Reason for visit Follow up for treatment of breast cancer.   Pertinent Oncology History Dominique Guzman 67 y.o. female with PMH listed as below is referred by Dr.Schermerhorn to [name] for evaluation and management of newly diagnosed breast cancer. Patient had a history a left breast mass biopsy followed by breast excisonal biopsy in 2015 with benign pathology.  She also has a history of Uterine cancer which was treated with RT with Dr.Chrystal.  She underwent routine screening mammogram on 04/17/2017 which recommended a possible mass on the left breast. Left diagnostic mammogram showed a highly suspicious left breast mass at 4 o'clock, 7 cm from the nipple and 1 o'clock,,  About 3 cm from the nipple. Enlarged left axillary lymph node suspicious for metastatic disease.  1 US breast confirmed a left breast mass of 2.1cm, 4 o'clock, and 1.1 cm mass at 1 o'clock.The masses at 1 and 4 o'clock are separated by approximately 4 cm sonographically 2 Biopsy of the two mass and axillary lymph node revealed invasive carcinoma, grade 2. Both are ER,PR positive. HER2 equivocal, FISH pending. 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  3 05/14/2017 MRI breast showed Two sites of biopsy proven malignancy seen in the left breast, which span up to 11.3 cm. There are intervening areas of non mass enhancement and a small suspicious enhancing mass. 4 Case was discussed on breast tumor board on 05/21/2017 and the panel agree with neoadjuvant ddAC to T followed by surgery. Clinically cT3N1M0  # Patient completed s/p 4 cycles ddAC and weekly taxol x 12, on 10/26/2017 patient underwent that surgery.  Left axillary sentinel lymph node was positive, so Dr. Tamala Julian proceeded with left mastectomy and left axillary lymph node dissection. Pathology showed ypT2(m) pN1a, ER/PR positive, HER2 FISH  negative, grade 2, negative margin, invasive mammary carcinoma.    INTERVAL HISTORY Patient presents after recent mastectomy and axillary lymph node dissection.  she is accompanied by her husband.  Surgical wound drainage still have some drainage and she feels sore.  Denies any fever, chills, mouth sores, nausea or vomiting. Denies numbness or tingling.    Review of Systems  Constitutional: Negative for fatigue.  HENT:   Negative for hearing loss and lump/mass.   Eyes: Negative for eye problems.  Respiratory: Negative for cough and hemoptysis.   Cardiovascular: Negative for chest pain and leg swelling.  Gastrointestinal: Negative for abdominal distention, abdominal pain and blood in stool.  Endocrine: Negative for hot flashes.  Genitourinary: Negative for difficulty urinating and dysuria.   Musculoskeletal: Negative for arthralgias, back pain and gait problem.  Skin: Positive for wound. Negative for rash.       Left modified radical mastectomy wound with drainage tubes x 1  Neurological: Negative for dizziness, gait problem and numbness.       Finger tip sensitivity resolved.  Hematological: Negative for adenopathy.  Psychiatric/Behavioral: Negative for confusion. The patient is not nervous/anxious.    [name]   MEDICAL HISTORY: Past Medical History:  Diagnosis Date  . Anemia   . Arthritis    HIP-LEFT  . Benign neoplasm of colon   . Breast cancer ([place])   . Cancer ([place])   . Diabetes mellitus without complication ([place])   . H/O osteopenia   . History of hiatal hernia   . Hypertension   . Sleep apnea    USE CPAP  .  Uterine cancer [facility])     SURGICAL HISTORY: Past Surgical History:  Procedure Laterality Date  . ABDOMINAL HYSTERECTOMY    . BREAST BIOPSY Left 04/2014   negative  . BREAST EXCISIONAL BIOPSY Left 2015  . CESAREAN SECTION     X2  . COLONOSCOPY WITH PROPOFOL N/A 04/15/2015   Procedure: COLONOSCOPY WITH PROPOFOL;  Surgeon: Hulen Luster, MD;  Location: [facility] ENDOSCOPY;   Service: Gastroenterology;  Laterality: N/A;  . HERNIA REPAIR    . MASTECTOMY MODIFIED RADICAL Left 10/26/2017   Procedure: MASTECTOMY MODIFIED RADICAL;  Surgeon: Leonie Green, MD;  Location: [facility] ORS;  Service: General;  Laterality: Left;  [name] MASTECTOMY W/ SENTINEL NODE BIOPSY Left 10/26/2017   Procedure: MASTECTOMY WITH SENTINEL LYMPH NODE BIOPSY;  Surgeon: Leonie Green, MD;  Location: [facility] ORS;  Service: General;  Laterality: Left;  . PORTACATH PLACEMENT Right 05/22/2017   Procedure: INSERTION PORT-A-CATH;  Surgeon: Leonie Green, MD;  Location: [facility] ORS;  Service: General;  Laterality: Right;    SOCIAL HISTORY: Social History   Socioeconomic History  . Marital status: Married    Spouse name: Not on file  . Number of children: Not on file  . Years of education: Not on file  . Highest education level: Not on file  Social Needs  . Financial resource strain: Not on file  . Food insecurity - worry: Not on file  . Food insecurity - inability: Not on file  . Transportation needs - medical: Not on file  . Transportation needs - non-medical: Not on file  Occupational History  . Not on file  Tobacco Use  . Smoking status: Never Smoker  . Smokeless tobacco: Never Used  Substance and Sexual Activity  . Alcohol use: No  . Drug use: No  . Sexual activity: Not on file  Other Topics Concern  . Not on file  Social History Narrative  . Not on file    FAMILY HISTORY Family History  Problem Relation Age of Onset  . Diabetes Mother   . Diabetes Father   . Diabetes Sister   . Breast cancer Neg Hx     ALLERGIES:  has No Known Allergies.  MEDICATIONS:  Current Outpatient Medications  Medication Sig Dispense Refill  . Calcium Carb-Cholecalciferol (CALCIUM 600-D PO) Take 1 tablet by mouth daily.    . chlorhexidine (PERIDEX) 0.12 % solution USE AS DIRECTED 15 ML IN THE MOUTH OR THROAT TWICE A DAY (Patient taking differently: USE AS DIRECTED 15 ML IN THE MOUTH OR  THROAT TWICE A DAY AS NEEDED FOR MOUTH SORES) 473 mL 0  . Cinnamon 500 MG capsule Take 1,000 mg by mouth daily.     . cyanocobalamin 1000 MCG tablet Take 1,000 mcg by mouth daily.    . diphenhydrAMINE (BENADRYL) 25 MG tablet Take 25 mg by mouth every 6 (six) hours as needed for allergies.     [name] FIBER PO Take 2 each by mouth daily.     . fluticasone (FLONASE) 50 MCG/ACT nasal spray Place 2 sprays into both nostrils daily as needed for allergies.     [name] GLUCOSAMINE-CHONDROITIN PO Take 1 tablet by mouth daily.    [name] HYDROcodone-acetaminophen (NORCO/VICODIN) 5-325 MG tablet Take 1-2 tablets by mouth every 6 (six) hours as needed for moderate pain. 16 tablet 0  . lidocaine-prilocaine (EMLA) cream Apply 1 application topically as needed. Apply small amount to port site at least 1 hour prior to it being accessed, cover with plastic wrap 30  g 3  . lisinopril-hydrochlorothiazide (PRINZIDE,ZESTORETIC) 20-12.5 MG per tablet Take 1 tablet by mouth daily.    [name] loperamide (IMODIUM) 2 MG capsule Take 1 capsule (2 mg total) by mouth See admin instructions. With onset of loose stool, take 44m followed by 283mevery 2 hours until 12 hours have passed without loose bowel movement. Maximum: 16 mg/day 120 capsule 1  . metFORMIN (GLUCOPHAGE) 500 MG tablet Take 1,000 mg by mouth 2 (two) times daily.     . Multiple Vitamin (MULTIVITAMIN) tablet Take 1 tablet by mouth daily.    . ondansetron (ZOFRAN) 8 MG tablet Take 1 tablet (8 mg total) by mouth every 8 (eight) hours as needed for nausea or vomiting. 120 tablet 0  . pyridOXINE (VITAMIN B-6) 100 MG tablet Take 1 tablet (100 mg total) by mouth daily. 30 tablet 2  . sitaGLIPtin (JANUVIA) 100 MG tablet Take 100 mg by mouth daily.    . [name] Kitchenriamcinolone cream (KENALOG) 0.1 % Apply 1 application topically daily as needed (for rash).    . valACYclovir (VALTREX) 1000 MG tablet Take 1 tablet (1,000 mg total) by mouth 2 (two) times daily. 14 tablet 0   No current facility-administered  medications for this visit.     PHYSICAL EXAMINATION:  ECOG PERFORMANCE STATUS: 1 - Symptomatic but completely ambulatory  Vitals:   11/26/17 1029  BP: 121/72  Pulse: 94  Resp: 18  Temp: 100.1 F (37.8 C)    Filed Weights   11/26/17 1029  Weight: 195 lb 14.4 oz (88.9 kg)     Physical Exam  Constitutional: She is oriented to person, place, and time and well-developed, well-nourished, and in no distress.  HENT:  Head: Normocephalic and atraumatic.  Mouth/Throat: Oropharynx is clear and moist. No oropharyngeal exudate.  Eyes: Conjunctivae and EOM are normal. Pupils are equal, round, and reactive to light. Right eye exhibits no discharge. No scleral icterus.  Neck: Normal range of motion. Neck supple.  Cardiovascular: Normal rate, regular rhythm and normal heart sounds.  No murmur heard. Pulmonary/Chest: Effort normal and breath sounds normal. No respiratory distress. She has no rales. She exhibits no tenderness.  Abdominal: Soft. Bowel sounds are normal. She exhibits no distension. There is no tenderness. There is no rebound and no guarding.  Musculoskeletal: Normal range of motion. She exhibits no edema or tenderness.  Lymphadenopathy:    She has no cervical adenopathy.  Neurological: She is alert and oriented to person, place, and time.  Skin: Skin is warm and dry. She is not diaphoretic. No erythema.  Psychiatric: Affect and judgment normal.  Breast exam was performed in seated position. S/p left modified radical mastectomy, healing scar, no erythema. Drainage tubes x 1   ECOG 1  LABORATORY DATA: CBC Latest Ref Rng & Units 11/09/2017 10/03/2017 09/26/2017  WBC 3.6 - 11.0 K/uL 14.2(H) 6.3 6.6  Hemoglobin 12.0 - 16.0 g/dL 10.1(L) 9.5(L) 9.9(L)  Hematocrit 35.0 - 47.0 % 30.9(L) 27.6(L) 28.5(L)  Platelets 150 - 440 K/uL 345 330 367   CMP Latest Ref Rng & Units 11/09/2017 10/03/2017 09/26/2017  Glucose 65 - 99 mg/dL 189(H) 191(H) 168(H)  BUN 6 - 20 mg/dL '13 12 11   ' Creatinine 0.44 - 1.00 mg/dL 0.63 0.56 0.49  Sodium 135 - 145 mmol/L 131(L) 134(L) 132(L)  Potassium 3.5 - 5.1 mmol/L 3.9 4.3 4.2  Chloride 101 - 111 mmol/L 97(L) 102 100(L)  CO2 22 - 32 mmol/L '23 26 23  ' Calcium 8.9 - 10.3 mg/dL 9.2 8.9 9.6  Total Protein 6.5 - 8.1 g/dL 6.4(L) 6.3(L) 6.4(L)  Total Bilirubin 0.3 - 1.2 mg/dL 0.4 0.5 0.5  Alkaline Phos 38 - 126 U/L 72 69 82  AST 15 - 41 U/L '19 26 27  ' ALT 14 - 54 U/L 13(L) 22 17   Normal  B12 and Folate. Iron panel was checked in September showed no signs of iron deficiency  PATHOLOGY 10/27/2017  Surgical Pathology  CASE: [id number]  PATIENT: [facility]  Surgical Pathology Report  SPECIMEN SUBMITTED:  A. Sentinel lymph node #1, left axilla  B. Sentinel lymph nodes #2 and 3, left axilla  C. Breast, left; stitch outer end of skin ellipse, and axillary lymph  nodes  DIAGNOSIS:  A. SENTINEL LYMPH NODE #1, LEFT AXILLA; EXCISION:  - METASTATIC MAMMARY CARCINOMA, ONE LYMPH NODE, 5 MM METASTASIS (1/1).   B. SENTINEL LYMPH NODES #2 AND #3, LEFT AXILLA; EXCISION:  - NEGATIVE FOR MALIGNANCY, THREE LYMPH NODES (0/3).   C. BREAST, LEFT; MODIFIED RADICAL MASTECTOMY:  - INVASIVE MAMMARY CARCINOMA, TWO SITES, SEE SUMMARY BELOW.  - BIOPSY SITE CHANGES AT TWO SITES; ONE MARKER CLIP FOUND.  - METASTATIC MAMMARY CARCINOMA IN TWO OF FOUR AXILLARY LYMPH NODES, WITH  LARGER METASTASIS (8 MM) LOCATED IN NODE WITH MARKER CLIP (2/4).   Comment:  ER, PR, and HER2 were assessed on the previous core biopsies  ([id number], 04/25/2017) with results summarized as follows:  ER IHC (SP1): Both tumors positive, >90%, strong staining  PR IHC (IE2): Both tumors positive, 1:00 tumor 51-90%, mod to strong,  and 4:00 tumor >90% with strong staining  HER2 IHC (HercepTest): Both tumors equivocal, score 2+  HER2 FISH (DAKO IQFISH PharmDX): Both tumors negative (not amplified)   Surgical Pathology Cancer Case Summary  INVASIVE CARCINOMA OF THE BREAST:   Procedure: Total mastectomy  Specimen Laterality: Left  Histologic Type: Invasive carcinoma of no special type  Histologic Grade (Nottingham Histologic Score)       Glandular (Acinar)/Tubular Differentiation: Score 3       Nuclear Pleomorphism: Score 2       Mitotic Rate: Score 1       Overall Grade: Grade 2  Tumor Size: 23 mm (4-5:00 posterior tumor); 12 mm (1:00 tumor)  Ductal Carcinoma In Situ (DCIS): Present  DCIS Nuclear Grade: Intermediate grade  Extensive intraductal component: Negative for extensive intraductal  component  Margins:       Invasive carcinoma margins: Uninvolved by invasive carcinoma       Distance from closest margin: 0.2 mm, deep       DCIS margins: Uninvolved by DCIS       Distance from closest margin: Cannot be determined; margins  not present in sections containing DCIS  Regional Lymph nodes:    Total # lymph nodes examined: 8    # Sentinel lymph nodes examined: 4    # Lymph nodes with macrometastasis (>2.0 mm): 2    # Lymph nodes with isolated tumor cells (<0.2 mm): 0    # Lymph nodes with micrometastasis (> 0.2 mm and < 2.0 mm): 1    Extranodal extension: Present  Treatment Effect:       In the breast: Definite response to presurgical therapy in the invasive carcinoma       In the lymph nodes: No definite response to presurgical therapy in metastatic carcinoma  Residual Cancer Burden (RCB):    Primary Tumor Bed  Primary tumor bed:23 mm x 11 mm    Overall cancer cellularity: 0.5%  Percentage of cancer that is in situ: 0%     Lymph nodes    # of lymph nodes positive for metastasis: 3    Diameter of largest metastasis: 8 mm    Residual Cancer Burden (RCB): 2.553       Residual Cancer Burden Class: RCB-II  Lymphovascular Invasion: Cannot be determined with extensive retraction  changes  Pathologic Stage Classification (pTNM, AJCC 8th Edition): ypT2(m)  pN1a  TNM Descriptors: y (posttreatment); m (multiple foci of invasive  carcinoma)    ASSESSMENT/PLAN 67 y.o. 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  S.p post left modified radical mastectomy with ALND.   1. Malignant neoplasm of upper-outer quadrant of left breast in female, estrogen receptor positive ([place])   2. Malignant neoplasm of lower-outer quadrant of left breast of female, estrogen receptor positive ([place])   3. Breast cancer metastasized to axillary lymph node, left ([place])    # ypT2N1a M0 disease, ER+PR+, HER2-.  # Follow up with Dr. Tamala Julian for wound care. Her temperature today is 100.1, no dysuria, cough. Check cbc, UA.   # refer to Dr.Chrystal for evaluation of adjuvant RT .  # After completion of RT, she may be eligible for phase III clinic trial with ribociclib with endocrine therapy as adjuvant treatment. Has referred her to research department. Patient is interested.   # Port flush Q6-8 weeks scheduled. . After she finishes all her treatment, She prefers to take out the port.  will send to surgeon to have Medi port taken out.    # She meets criteria for Genetic testing, two breast cancer primaries.  Will refer patient for genetic testing.   All questions were answered. The patient knows to call the clinic with any problems, questions or concerns.  Earlie Server, MD, PhD Hematology Oncology [facility] Pager- [phone] 11/26/2017

## 2017-11-26 ENCOUNTER — Inpatient Hospital Stay: Payer: BLUE CROSS/BLUE SHIELD

## 2017-11-26 ENCOUNTER — Inpatient Hospital Stay (HOSPITAL_BASED_OUTPATIENT_CLINIC_OR_DEPARTMENT_OTHER): Payer: BLUE CROSS/BLUE SHIELD | Admitting: Oncology

## 2017-11-26 ENCOUNTER — Encounter: Payer: Self-pay | Admitting: Oncology

## 2017-11-26 VITALS — BP 121/72 | HR 94 | Temp 100.1°F | Resp 18 | Ht <= 58 in | Wt 195.9 lb

## 2017-11-26 DIAGNOSIS — C50412 Malignant neoplasm of upper-outer quadrant of left female breast: Secondary | ICD-10-CM

## 2017-11-26 DIAGNOSIS — C773 Secondary and unspecified malignant neoplasm of axilla and upper limb lymph nodes: Secondary | ICD-10-CM

## 2017-11-26 DIAGNOSIS — Z170 Estrogen receptor positive status [ER+]: Secondary | ICD-10-CM | POA: Diagnosis not present

## 2017-11-26 DIAGNOSIS — C50512 Malignant neoplasm of lower-outer quadrant of left female breast: Secondary | ICD-10-CM | POA: Diagnosis not present

## 2017-11-26 DIAGNOSIS — Z5111 Encounter for antineoplastic chemotherapy: Secondary | ICD-10-CM

## 2017-11-26 DIAGNOSIS — C50912 Malignant neoplasm of unspecified site of left female breast: Secondary | ICD-10-CM

## 2017-11-26 LAB — URINALYSIS, COMPLETE (UACMP) WITH MICROSCOPIC
BILIRUBIN URINE: NEGATIVE
Bacteria, UA: NONE SEEN
Glucose, UA: NEGATIVE mg/dL
HGB URINE DIPSTICK: NEGATIVE
Ketones, ur: NEGATIVE mg/dL
NITRITE: NEGATIVE
PROTEIN: NEGATIVE mg/dL
Specific Gravity, Urine: 1.017 (ref 1.005–1.030)
pH: 5 (ref 5.0–8.0)

## 2017-11-26 LAB — COMPREHENSIVE METABOLIC PANEL
ALBUMIN: 3.5 g/dL (ref 3.5–5.0)
ALT: 12 U/L — AB (ref 14–54)
AST: 19 U/L (ref 15–41)
Alkaline Phosphatase: 83 U/L (ref 38–126)
Anion gap: 6 (ref 5–15)
BUN: 11 mg/dL (ref 6–20)
CHLORIDE: 101 mmol/L (ref 101–111)
CO2: 28 mmol/L (ref 22–32)
CREATININE: 0.6 mg/dL (ref 0.44–1.00)
Calcium: 9.6 mg/dL (ref 8.9–10.3)
GFR calc non Af Amer: >60 mL/min (ref >60)
GLUCOSE: 122 mg/dL — AB (ref 65–99)
Potassium: 3.9 mmol/L (ref 3.5–5.1)
SODIUM: 135 mmol/L (ref 135–145)
Total Bilirubin: 0.3 mg/dL (ref 0.3–1.2)
Total Protein: 6.7 g/dL (ref 6.5–8.1)

## 2017-11-26 LAB — CBC WITH DIFFERENTIAL/PLATELET
BASOS ABS: 0.1 10*3/uL (ref 0–0.1)
BASOS PCT: 1 %
EOS ABS: 0.3 10*3/uL (ref 0–0.7)
EOS PCT: 4 %
HCT: 33.2 % — ABNORMAL LOW (ref 35.0–47.0)
Hemoglobin: 11.2 g/dL — ABNORMAL LOW (ref 12.0–16.0)
Lymphocytes Relative: 15 %
Lymphs Abs: 1.4 10*3/uL (ref 1.0–3.6)
MCH: 27.8 pg (ref 26.0–34.0)
MCHC: 33.7 g/dL (ref 32.0–36.0)
MCV: 82.7 fL (ref 80.0–100.0)
Monocytes Absolute: 1.1 10*3/uL — ABNORMAL HIGH (ref 0.2–0.9)
Monocytes Relative: 12 %
NEUTROS PCT: 70 %
Neutro Abs: 6.7 10*3/uL — ABNORMAL HIGH (ref 1.4–6.5)
PLATELETS: 399 10*3/uL (ref 150–440)
RBC: 4.01 MIL/uL (ref 3.80–5.20)
RDW: 15.6 % — ABNORMAL HIGH (ref 11.5–14.5)
WBC: 9.7 10*3/uL (ref 3.6–11.0)

## 2017-11-26 MED ORDER — HYDROCODONE-ACETAMINOPHEN 5-325 MG PO TABS: 1.0000 | ORAL_TABLET | Freq: Four times a day (QID) | ORAL | 0 refills | Status: DC | PRN

## 2017-11-26 NOTE — Progress Notes (Signed)
No new changes today/ Patient states with drainage tube a little pain , but nothing new.

## 2017-11-27 ENCOUNTER — Encounter: Payer: Self-pay | Admitting: Oncology

## 2017-11-27 ENCOUNTER — Telehealth: Payer: Self-pay | Admitting: *Deleted

## 2017-11-27 ENCOUNTER — Encounter: Payer: Self-pay | Admitting: Genetic Counselor

## 2017-11-27 ENCOUNTER — Telehealth: Payer: Self-pay | Admitting: Genetic Counselor

## 2017-11-27 LAB — URINE CULTURE

## 2017-11-27 NOTE — Telephone Encounter (Signed)
Patient reports she dropped off a form to return to work down front and she needs this completed and back by Thursday. Please return her call [phone]

## 2017-11-27 NOTE — Telephone Encounter (Signed)
Cancer Genetics            Telegenetics Initial Visit    Patient Name: Dominique Guzman Patient DOB: 29-Jul-1951 Patient Age: 67 y.o. Phone Call Date: 11/27/2017  Referring Provider: Earlie Server, MD  Reason for Visit: Evaluate for hereditary susceptibility to cancer    Assessment and Plan:  . Dominique Guzman's history of two primary breast cancer at age 58 is not highly suggestive of a hereditary predisposition to cancer. However, her father had no siblings. This paucity of women makes risk assessment challenging.   . Testing is recommended to determine whether she has a pathogenic mutation that will impact her screening and risk-reduction for cancer. A negative result will be reassuring.  . Dominique Guzman wished to pursue genetic testing, but wanted her insurance benefits checked first. Once that information is available, she will schedule a lab visit for a blood sample. Analysis will include the 83 genes on [facility]'s Multi-Cancer panel (ALK, APC, ATM, AXIN2, BAP1, BARD1, BLM, BMPR1A, BRCA1, BRCA2, BRIP1, CASR, CDC73, CDH1, CDK4, CDKN1B, CDKN1C, CDKN2A, CEBPA, CHEK2, CTNNA1, DICER1, DIS3L2, EGFR, EPCAM, FH, FLCN, GATA2, GPC3, GREM1, HOXB13, HRAS, KIT, MAX, MEN1, MET, MITF, MLH1, MSH2, MSH3, MSH6, MUTYH, NBN, NF1, NF2, NTHL1, PALB2, PDGFRA, PHOX2B, PMS2, POLD1, POLE, POT1, PRKAR1A, PTCH1, PTEN, RAD50, RAD51C, RAD51D, RB1, RECQL4, RET, RUNX1, SDHA, SDHAF2, SDHB, SDHC, SDHD, SMAD4, SMARCA4, SMARCB1, SMARCE1, STK11, SUFU, TERC, TERT, TMEM127, TP53, TSC1, TSC2, VHL, WRN, WT1).   . Once the lab receives her specimen, results should be available in approximately 2-3 weeks, at which point we will contact her and address implications for her as well as address genetic testing for at-risk family members, if needed.      Dr. Grayland Ormond was available for questions concerning this case. Total time spent by counseling by phone was approximately 25 minutes.    _____________________________________________________________________   History of Present Illness: Dominique Guzman, a 67 y.o. female, was referred for genetic counseling to discuss the possibility of a hereditary predisposition to cancer and discuss whether genetic testing is warranted. This was a telegenetics visit via phone.  Dominique Guzman was diagnosed with breast cancer at the age of 32. There were two separate tumors in different areas of the left breast. She is s/p left mastectomy and will be starting radiation soon.   She also has a history of uterine cancer diagnosed at age 43 for which she had TAH/BSO and radiation.  Past Medical History:  Diagnosis Date  . Anemia   . Arthritis    HIP-LEFT  . Benign neoplasm of colon   . Breast cancer ([place])   . Cancer ([place])   . Diabetes mellitus without complication ([place])   . H/O osteopenia   . History of hiatal hernia   . Hypertension   . Sleep apnea    USE CPAP  . Uterine cancer [facility])     Past Surgical History:  Procedure Laterality Date  . ABDOMINAL HYSTERECTOMY    . BREAST BIOPSY Left 04/2014   negative  . BREAST EXCISIONAL BIOPSY Left 2015  . CESAREAN SECTION     X2  . COLONOSCOPY WITH PROPOFOL N/A 04/15/2015   Procedure: COLONOSCOPY WITH PROPOFOL;  Surgeon: Hulen Luster, MD;  Location: [facility] ENDOSCOPY;  Service: Gastroenterology;  Laterality: N/A;  . HERNIA REPAIR    . MASTECTOMY MODIFIED RADICAL Left 10/26/2017   Procedure: MASTECTOMY MODIFIED RADICAL;  Surgeon: Leonie Green, MD;  Location:  [facility] ORS;  Service: General;  Laterality: Left;  [name] MASTECTOMY W/ SENTINEL NODE BIOPSY Left 10/26/2017   Procedure: MASTECTOMY WITH SENTINEL LYMPH NODE BIOPSY;  Surgeon: Leonie Green, MD;  Location: [facility] ORS;  Service: General;  Laterality: Left;  . PORTACATH PLACEMENT Right 05/22/2017   Procedure: INSERTION PORT-A-CATH;  Surgeon: Leonie Green, MD;  Location: [facility] ORS;  Service: General;  Laterality: Right;     Family History: Significant diagnoses include the following:  Family History  Problem Relation Age of Onset  . Diabetes Mother   . Diabetes Father   . Melanoma Father 43       on finger  . Diabetes Sister   . Pancreatic cancer Paternal Grandfather        deceased 53s  . Breast cancer Other 45       paternal grandmother's mother    Additionally, Dominique Guzman has 2 sons (ages 15 and 38). She has 2 sisters (ages 47 and 58). She had a brother who died at 79, unrelated to cancer. Her mother died at 66. She had a TAH/BSO around age 76. Her mother had one sister. Her father (age 35) is noted above. He had no siblings that lived to adulthood.  Dominique Guzman ancestry is Caucasian - NOS. She stated that her maternal grandmother's family is of [place] ancestry. There is no consanguinity.  Discussion: We reviewed the characteristics, features and inheritance patterns of hereditary cancer syndromes. We discussed her risk of harboring a mutation in the context of her personal and family history as well as partial Ashkenazi Jewish ancestry. We discussed that her small paternal family and paucity of women make risk assessment challenging. We discussed the process of genetic testing, insurance coverage and implications of results: positive, negative and variant of unknown significance (VUS).   Ms. Sorenson questions were answered to her satisfaction today and she is welcome to call with any additional questions or concerns. Thank you for the referral and allowing [name] to share in the care of your patient.    Steele Berg, MS, [place] Certified Genetic Counselor phone: [phone]

## 2017-11-28 NOTE — Telephone Encounter (Signed)
I have not seen this form.. Did you happen to received it connie?

## 2017-11-30 ENCOUNTER — Inpatient Hospital Stay: Payer: BLUE CROSS/BLUE SHIELD

## 2017-11-30 DIAGNOSIS — Z95828 Presence of other vascular implants and grafts: Secondary | ICD-10-CM

## 2017-11-30 DIAGNOSIS — C50412 Malignant neoplasm of upper-outer quadrant of left female breast: Secondary | ICD-10-CM | POA: Diagnosis not present

## 2017-11-30 MED ORDER — SODIUM CHLORIDE 0.9% FLUSH
10.0000 mL | Freq: Once | INTRAVENOUS | Status: AC
  Administered 2017-11-30: 10 mL via INTRAVENOUS
  Filled 2017-11-30: qty 10

## 2017-11-30 MED ORDER — HEPARIN SOD (PORK) LOCK FLUSH 100 UNIT/ML IV SOLN
500.0000 [IU] | Freq: Once | INTRAVENOUS | Status: AC
  Administered 2017-11-30: 500 [IU] via INTRAVENOUS

## 2017-12-05 ENCOUNTER — Ambulatory Visit
Admission: RE | Admit: 2017-12-05 | Discharge: 2017-12-05 | Disposition: A | Discharge status: Home / Self Care | Payer: BLUE CROSS/BLUE SHIELD | Source: Ambulatory Visit | Attending: Radiation Oncology | Admitting: Radiation Oncology

## 2017-12-05 DIAGNOSIS — C50812 Malignant neoplasm of overlapping sites of left female breast: Secondary | ICD-10-CM | POA: Diagnosis not present

## 2017-12-07 ENCOUNTER — Other Ambulatory Visit: Payer: Self-pay | Admitting: *Deleted

## 2017-12-07 DIAGNOSIS — C50812 Malignant neoplasm of overlapping sites of left female breast: Secondary | ICD-10-CM

## 2017-12-11 DIAGNOSIS — Z510 Encounter for antineoplastic radiation therapy: Secondary | ICD-10-CM | POA: Diagnosis not present

## 2017-12-11 DIAGNOSIS — C773 Secondary and unspecified malignant neoplasm of axilla and upper limb lymph nodes: Secondary | ICD-10-CM | POA: Diagnosis not present

## 2017-12-11 DIAGNOSIS — C50412 Malignant neoplasm of upper-outer quadrant of left female breast: Secondary | ICD-10-CM | POA: Diagnosis not present

## 2017-12-11 DIAGNOSIS — Z9012 Acquired absence of left breast and nipple: Secondary | ICD-10-CM | POA: Diagnosis not present

## 2017-12-11 DIAGNOSIS — Z170 Estrogen receptor positive status [ER+]: Secondary | ICD-10-CM | POA: Diagnosis not present

## 2017-12-11 DIAGNOSIS — C50512 Malignant neoplasm of lower-outer quadrant of left female breast: Secondary | ICD-10-CM | POA: Insufficient documentation

## 2017-12-13 ENCOUNTER — Ambulatory Visit
Admission: RE | Admit: 2017-12-13 | Discharge: 2017-12-13 | Disposition: A | Discharge status: Home / Self Care | Payer: BLUE CROSS/BLUE SHIELD | Source: Ambulatory Visit | Attending: Radiation Oncology | Admitting: Radiation Oncology

## 2017-12-13 DIAGNOSIS — Z510 Encounter for antineoplastic radiation therapy: Secondary | ICD-10-CM | POA: Diagnosis not present

## 2017-12-17 ENCOUNTER — Ambulatory Visit
Admission: RE | Admit: 2017-12-17 | Discharge: 2017-12-17 | Disposition: A | Discharge status: Home / Self Care | Payer: BLUE CROSS/BLUE SHIELD | Source: Ambulatory Visit | Attending: Radiation Oncology | Admitting: Radiation Oncology

## 2017-12-17 DIAGNOSIS — Z510 Encounter for antineoplastic radiation therapy: Secondary | ICD-10-CM | POA: Diagnosis not present

## 2017-12-18 ENCOUNTER — Ambulatory Visit
Admission: RE | Admit: 2017-12-18 | Discharge: 2017-12-18 | Disposition: A | Discharge status: Home / Self Care | Payer: BLUE CROSS/BLUE SHIELD | Source: Ambulatory Visit | Attending: Radiation Oncology | Admitting: Radiation Oncology

## 2017-12-18 DIAGNOSIS — Z510 Encounter for antineoplastic radiation therapy: Secondary | ICD-10-CM | POA: Diagnosis not present

## 2017-12-19 ENCOUNTER — Ambulatory Visit
Admission: RE | Admit: 2017-12-19 | Discharge: 2017-12-19 | Disposition: A | Discharge status: Home / Self Care | Payer: BLUE CROSS/BLUE SHIELD | Source: Ambulatory Visit | Attending: Radiation Oncology | Admitting: Radiation Oncology

## 2017-12-19 DIAGNOSIS — Z510 Encounter for antineoplastic radiation therapy: Secondary | ICD-10-CM | POA: Diagnosis not present

## 2017-12-20 ENCOUNTER — Ambulatory Visit
Admission: RE | Admit: 2017-12-20 | Discharge: 2017-12-20 | Disposition: A | Discharge status: Home / Self Care | Payer: BLUE CROSS/BLUE SHIELD | Source: Ambulatory Visit | Attending: Radiation Oncology | Admitting: Radiation Oncology

## 2017-12-20 DIAGNOSIS — Z510 Encounter for antineoplastic radiation therapy: Secondary | ICD-10-CM | POA: Diagnosis not present

## 2017-12-21 ENCOUNTER — Ambulatory Visit
Admission: RE | Admit: 2017-12-21 | Discharge: 2017-12-21 | Disposition: A | Discharge status: Home / Self Care | Payer: BLUE CROSS/BLUE SHIELD | Source: Ambulatory Visit | Attending: Radiation Oncology | Admitting: Radiation Oncology

## 2017-12-21 DIAGNOSIS — Z510 Encounter for antineoplastic radiation therapy: Secondary | ICD-10-CM | POA: Diagnosis not present

## 2017-12-24 ENCOUNTER — Inpatient Hospital Stay: Payer: BLUE CROSS/BLUE SHIELD

## 2017-12-24 ENCOUNTER — Other Ambulatory Visit: Payer: Self-pay

## 2017-12-24 ENCOUNTER — Encounter: Payer: Self-pay | Admitting: Oncology

## 2017-12-24 ENCOUNTER — Ambulatory Visit
Admission: RE | Admit: 2017-12-24 | Discharge: 2017-12-24 | Disposition: A | Discharge status: Home / Self Care | Payer: BLUE CROSS/BLUE SHIELD | Source: Ambulatory Visit | Attending: Radiation Oncology | Admitting: Radiation Oncology

## 2017-12-24 ENCOUNTER — Inpatient Hospital Stay: Payer: BLUE CROSS/BLUE SHIELD | Attending: Oncology | Admitting: Oncology

## 2017-12-24 ENCOUNTER — Telehealth: Payer: Self-pay | Admitting: Oncology

## 2017-12-24 VITALS — BP 124/86 | HR 85 | Temp 98.0°F | Resp 12 | Ht <= 58 in | Wt 195.1 lb

## 2017-12-24 DIAGNOSIS — I10 Essential (primary) hypertension: Secondary | ICD-10-CM | POA: Diagnosis not present

## 2017-12-24 DIAGNOSIS — Z79899 Other long term (current) drug therapy: Secondary | ICD-10-CM

## 2017-12-24 DIAGNOSIS — Z9012 Acquired absence of left breast and nipple: Secondary | ICD-10-CM | POA: Insufficient documentation

## 2017-12-24 DIAGNOSIS — C50512 Malignant neoplasm of lower-outer quadrant of left female breast: Secondary | ICD-10-CM

## 2017-12-24 DIAGNOSIS — E119 Type 2 diabetes mellitus without complications: Secondary | ICD-10-CM | POA: Insufficient documentation

## 2017-12-24 DIAGNOSIS — M1990 Unspecified osteoarthritis, unspecified site: Secondary | ICD-10-CM | POA: Diagnosis not present

## 2017-12-24 DIAGNOSIS — Z9989 Dependence on other enabling machines and devices: Secondary | ICD-10-CM | POA: Diagnosis not present

## 2017-12-24 DIAGNOSIS — Z510 Encounter for antineoplastic radiation therapy: Secondary | ICD-10-CM | POA: Diagnosis not present

## 2017-12-24 DIAGNOSIS — Z8542 Personal history of malignant neoplasm of other parts of uterus: Secondary | ICD-10-CM | POA: Diagnosis not present

## 2017-12-24 DIAGNOSIS — Z7984 Long term (current) use of oral hypoglycemic drugs: Secondary | ICD-10-CM | POA: Insufficient documentation

## 2017-12-24 DIAGNOSIS — C773 Secondary and unspecified malignant neoplasm of axilla and upper limb lymph nodes: Secondary | ICD-10-CM | POA: Insufficient documentation

## 2017-12-24 DIAGNOSIS — G4730 Sleep apnea, unspecified: Secondary | ICD-10-CM | POA: Insufficient documentation

## 2017-12-24 DIAGNOSIS — G629 Polyneuropathy, unspecified: Secondary | ICD-10-CM

## 2017-12-24 DIAGNOSIS — C50412 Malignant neoplasm of upper-outer quadrant of left female breast: Secondary | ICD-10-CM | POA: Diagnosis not present

## 2017-12-24 DIAGNOSIS — Z803 Family history of malignant neoplasm of breast: Secondary | ICD-10-CM

## 2017-12-24 DIAGNOSIS — Z170 Estrogen receptor positive status [ER+]: Secondary | ICD-10-CM | POA: Diagnosis not present

## 2017-12-24 DIAGNOSIS — C50812 Malignant neoplasm of overlapping sites of left female breast: Secondary | ICD-10-CM

## 2017-12-24 DIAGNOSIS — C50912 Malignant neoplasm of unspecified site of left female breast: Secondary | ICD-10-CM

## 2017-12-24 DIAGNOSIS — Z5111 Encounter for antineoplastic chemotherapy: Secondary | ICD-10-CM

## 2017-12-24 DIAGNOSIS — Z90710 Acquired absence of both cervix and uterus: Secondary | ICD-10-CM | POA: Diagnosis not present

## 2017-12-24 LAB — COMPREHENSIVE METABOLIC PANEL
ALBUMIN: 3.8 g/dL (ref 3.5–5.0)
ALT: 17 U/L (ref 14–54)
ANION GAP: 9 (ref 5–15)
AST: 21 U/L (ref 15–41)
Alkaline Phosphatase: 87 U/L (ref 38–126)
BUN: 13 mg/dL (ref 6–20)
CO2: 24 mmol/L (ref 22–32)
Calcium: 9.9 mg/dL (ref 8.9–10.3)
Chloride: 105 mmol/L (ref 101–111)
Creatinine, Ser: 0.64 mg/dL (ref 0.44–1.00)
GFR calc Af Amer: >60 mL/min (ref >60)
GFR calc non Af Amer: >60 mL/min (ref >60)
GLUCOSE: 147 mg/dL — AB (ref 65–99)
POTASSIUM: 4.1 mmol/L (ref 3.5–5.1)
SODIUM: 138 mmol/L (ref 135–145)
Total Bilirubin: 0.5 mg/dL (ref 0.3–1.2)
Total Protein: 7.1 g/dL (ref 6.5–8.1)

## 2017-12-24 LAB — CBC WITH DIFFERENTIAL/PLATELET
Basophils Absolute: 0 10*3/uL (ref 0–0.1)
Basophils Relative: 1 %
Eosinophils Absolute: 0.4 10*3/uL (ref 0–0.7)
Eosinophils Relative: 6 %
HEMATOCRIT: 35.9 % (ref 35.0–47.0)
Hemoglobin: 12.2 g/dL (ref 12.0–16.0)
LYMPHS ABS: 1.3 10*3/uL (ref 1.0–3.6)
LYMPHS PCT: 18 %
MCH: 27 pg (ref 26.0–34.0)
MCHC: 33.9 g/dL (ref 32.0–36.0)
MCV: 79.6 fL — AB (ref 80.0–100.0)
MONO ABS: 0.8 10*3/uL (ref 0.2–0.9)
MONOS PCT: 12 %
NEUTROS ABS: 4.4 10*3/uL (ref 1.4–6.5)
Neutrophils Relative %: 63 %
Platelets: 307 10*3/uL (ref 150–440)
RBC: 4.51 MIL/uL (ref 3.80–5.20)
RDW: 15.6 % — AB (ref 11.5–14.5)
WBC: 6.9 10*3/uL (ref 3.6–11.0)

## 2017-12-24 MED ORDER — VITAMIN B-6 100 MG PO TABS: 100.0000 mg | ORAL_TABLET | Freq: Every day | ORAL | 3 refills | Status: DC

## 2017-12-24 NOTE — Progress Notes (Signed)
No changes since last apt. She has started radiation.

## 2017-12-24 NOTE — Progress Notes (Signed)
[facility] Follow Up Visit.  Patient Care Team: Glendon Axe, MD as PCP - General (Internal Medicine)  Reason for visit Follow up for treatment of breast cancer.   Pertinent Oncology History KADIAN BARCELLOS 67 y.o. female with PMH listed as below is referred by Dr.Schermerhorn to [name] for evaluation and management of newly diagnosed breast cancer. Patient had a history a left breast mass biopsy followed by breast excisonal biopsy in 2015 with benign pathology.  She also has a history of Uterine cancer which was treated with RT with Dr.Chrystal.  She underwent routine screening mammogram on 04/17/2017 which recommended a possible mass on the left breast. Left diagnostic mammogram showed a highly suspicious left breast mass at 4 o'clock, 7 cm from the nipple and 1 o'clock,,  About 3 cm from the nipple. Enlarged left axillary lymph node suspicious for metastatic disease.  1 US breast confirmed a left breast mass of 2.1cm, 4 o'clock, and 1.1 cm mass at 1 o'clock.The masses at 1 and 4 o'clock are separated by approximately 4 cm sonographically 2 Biopsy of the two mass and axillary lymph node revealed invasive carcinoma, grade 2. Both are ER,PR positive. HER2 equivocal, FISH pending. 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  3 05/14/2017 MRI breast showed Two sites of biopsy proven malignancy seen in the left breast, which span up to 11.3 cm. There are intervening areas of non mass enhancement and a small suspicious enhancing mass. 4 Case was discussed on breast tumor board on 05/21/2017 and the panel agree with neoadjuvant ddAC to T followed by surgery. Clinically cT3N1M0  # Patient completed s/p 4 cycles ddAC and weekly taxol x 12, on 10/26/2017 patient underwent that surgery.  Left axillary sentinel lymph node was positive, so Dr. Tamala Julian proceeded with left mastectomy and left axillary lymph node dissection. Pathology showed ypT2(m) pN1a, ER/PR positive, HER2 FISH  negative, grade 2, negative margin, invasive mammary carcinoma.    INTERVAL HISTORY Patient presents for follow up.    Denies any fever, chills, mouth sores, nausea or vomiting. She has started radiation.  Plan  5 weeks of radiation ending at the end of April.  She has returned to work feeling well.  Denies any fever or chills.  She reports mild but persistent numbness and tingling with her fingertips, not interfering with her daily activity..   Review of Systems  Constitutional: Negative for fatigue.  HENT:   Negative for hearing loss and lump/mass.   Eyes: Negative for eye problems.  Respiratory: Negative for cough and hemoptysis.   Cardiovascular: Negative for chest pain and leg swelling.  Gastrointestinal: Negative for abdominal distention, abdominal pain and blood in stool.  Endocrine: Negative for hot flashes.  Genitourinary: Negative for difficulty urinating and dysuria.   Musculoskeletal: Negative for arthralgias, back pain and gait problem.  Skin: Negative for rash and wound.  Neurological: Negative for dizziness, gait problem and numbness.  Hematological: Negative for adenopathy.  Psychiatric/Behavioral: Negative for confusion. The patient is not nervous/anxious.    [name]   MEDICAL HISTORY: Past Medical History:  Diagnosis Date  . Anemia   . Arthritis    HIP-LEFT  . Benign neoplasm of colon   . Breast cancer ([place])   . Cancer ([place])   . Diabetes mellitus without complication ([place])   . H/O osteopenia   . History of hiatal hernia   . Hypertension   . Sleep apnea    USE CPAP  . Uterine cancer ([place])  SURGICAL HISTORY: Past Surgical History:  Procedure Laterality Date  . ABDOMINAL HYSTERECTOMY    . BREAST BIOPSY Left 04/2014   negative  . BREAST EXCISIONAL BIOPSY Left 2015  . CESAREAN SECTION     X2  . COLONOSCOPY WITH PROPOFOL N/A 04/15/2015   Procedure: COLONOSCOPY WITH PROPOFOL;  Surgeon: Hulen Luster, MD;  Location: [facility] ENDOSCOPY;  Service: Gastroenterology;   Laterality: N/A;  . HERNIA REPAIR    . MASTECTOMY MODIFIED RADICAL Left 10/26/2017   Procedure: MASTECTOMY MODIFIED RADICAL;  Surgeon: Leonie Green, MD;  Location: [facility] ORS;  Service: General;  Laterality: Left;  [name] MASTECTOMY W/ SENTINEL NODE BIOPSY Left 10/26/2017   Procedure: MASTECTOMY WITH SENTINEL LYMPH NODE BIOPSY;  Surgeon: Leonie Green, MD;  Location: [facility] ORS;  Service: General;  Laterality: Left;  . PORTACATH PLACEMENT Right 05/22/2017   Procedure: INSERTION PORT-A-CATH;  Surgeon: Leonie Green, MD;  Location: [facility] ORS;  Service: General;  Laterality: Right;    SOCIAL HISTORY: Social History   Socioeconomic History  . Marital status: Married    Spouse name: Not on file  . Number of children: Not on file  . Years of education: Not on file  . Highest education level: Not on file  Social Needs  . Financial resource strain: Not on file  . Food insecurity - worry: Not on file  . Food insecurity - inability: Not on file  . Transportation needs - medical: Not on file  . Transportation needs - non-medical: Not on file  Occupational History  . Not on file  Tobacco Use  . Smoking status: Never Smoker  . Smokeless tobacco: Never Used  Substance and Sexual Activity  . Alcohol use: No  . Drug use: No  . Sexual activity: Not on file  Other Topics Concern  . Not on file  Social History Narrative  . Not on file    FAMILY HISTORY Family History  Problem Relation Age of Onset  . Diabetes Mother   . Diabetes Father   . Melanoma Father 83       on finger  . Diabetes Sister   . Pancreatic cancer Paternal Grandfather        deceased 84s  . Breast cancer Other 21       paternal grandmother's mother    ALLERGIES:  has No Known Allergies.  MEDICATIONS:  Current Outpatient Medications  Medication Sig Dispense Refill  . Calcium Carb-Cholecalciferol (CALCIUM 600-D PO) Take 1 tablet by mouth daily.    . chlorhexidine (PERIDEX) 0.12 % solution USE AS  DIRECTED 15 ML IN THE MOUTH OR THROAT TWICE A DAY (Patient taking differently: USE AS DIRECTED 15 ML IN THE MOUTH OR THROAT TWICE A DAY AS NEEDED FOR MOUTH SORES) 473 mL 0  . Cinnamon 500 MG capsule Take 1,000 mg by mouth daily.     . cyanocobalamin 1000 MCG tablet Take 1,000 mcg by mouth daily.    . diphenhydrAMINE (BENADRYL) 25 MG tablet Take 25 mg by mouth every 6 (six) hours as needed for allergies.     [name] FIBER PO Take 2 each by mouth daily.     . fluticasone (FLONASE) 50 MCG/ACT nasal spray Place 2 sprays into both nostrils daily as needed for allergies.     [name] GLUCOSAMINE-CHONDROITIN PO Take 1 tablet by mouth daily.    [name] HYDROcodone-acetaminophen (NORCO/VICODIN) 5-325 MG tablet Take 1-2 tablets by mouth every 6 (six) hours as needed for moderate pain. 16 tablet 0  .  lidocaine-prilocaine (EMLA) cream Apply 1 application topically as needed. Apply small amount to port site at least 1 hour prior to it being accessed, cover with plastic wrap 30 g 3  . lisinopril-hydrochlorothiazide (PRINZIDE,ZESTORETIC) 20-12.5 MG per tablet Take 1 tablet by mouth daily.    [name] loperamide (IMODIUM) 2 MG capsule Take 1 capsule (2 mg total) by mouth See admin instructions. With onset of loose stool, take 38m followed by 266mevery 2 hours until 12 hours have passed without loose bowel movement. Maximum: 16 mg/day 120 capsule 1  . metFORMIN (GLUCOPHAGE) 500 MG tablet Take 1,000 mg by mouth 2 (two) times daily.     . Multiple Vitamin (MULTIVITAMIN) tablet Take 1 tablet by mouth daily.    . ondansetron (ZOFRAN) 8 MG tablet Take 1 tablet (8 mg total) by mouth every 8 (eight) hours as needed for nausea or vomiting. 120 tablet 0  . pyridOXINE (VITAMIN B-6) 100 MG tablet Take 1 tablet (100 mg total) by mouth daily. 90 tablet 3  . sitaGLIPtin (JANUVIA) 100 MG tablet Take 100 mg by mouth daily.    . [name] Kitchenriamcinolone cream (KENALOG) 0.1 % Apply 1 application topically daily as needed (for rash).    . valACYclovir (VALTREX) 1000 MG  tablet Take 1 tablet (1,000 mg total) by mouth 2 (two) times daily. 14 tablet 0   No current facility-administered medications for this visit.     PHYSICAL EXAMINATION:  ECOG PERFORMANCE STATUS: 1 - Symptomatic but completely ambulatory  Vitals:   12/24/17 1031 12/24/17 1036  BP:  124/86  Pulse:  85  Resp: 12   Temp:  98 F (36.7 C)    Filed Weights   12/24/17 1031  Weight: 195 lb 1.6 oz (88.5 kg)     Physical Exam  Constitutional: She is oriented to person, place, and time and well-developed, well-nourished, and in no distress.  HENT:  Head: Normocephalic and atraumatic.  Mouth/Throat: Oropharynx is clear and moist. No oropharyngeal exudate.  Eyes: Conjunctivae and EOM are normal. Pupils are equal, round, and reactive to light. Right eye exhibits no discharge. No scleral icterus.  Neck: Normal range of motion. Neck supple.  Cardiovascular: Normal rate, regular rhythm and normal heart sounds.  No murmur heard. Pulmonary/Chest: Effort normal and breath sounds normal. No respiratory distress. She has no rales. She exhibits no tenderness.  Abdominal: Soft. Bowel sounds are normal. She exhibits no distension. There is no tenderness. There is no rebound and no guarding.  Musculoskeletal: Normal range of motion. She exhibits no edema or tenderness.  Lymphadenopathy:    She has no cervical adenopathy.  Neurological: She is alert and oriented to person, place, and time.  Skin: Skin is warm and dry. She is not diaphoretic. No erythema.  Psychiatric: Affect and judgment normal.  Breast exam was performed in seated position. S/p left modified radical mastectomy, healing scar, no erythema.   ECOG 0  LABORATORY DATA: CBC Latest Ref Rng & Units 12/24/2017 11/26/2017 11/09/2017  WBC 3.6 - 11.0 K/uL 6.9 9.7 14.2(H)  Hemoglobin 12.0 - 16.0 g/dL 12.2 11.2(L) 10.1(L)  Hematocrit 35.0 - 47.0 % 35.9 33.2(L) 30.9(L)  Platelets 150 - 440 K/uL 307 399 345   CMP Latest Ref Rng & Units  12/24/2017 11/26/2017 11/09/2017  Glucose 65 - 99 mg/dL 147(H) 122(H) 189(H)  BUN 6 - 20 mg/dL _0 Creatinine 0.44 - 1.00 mg/dL 0.64 0.60 0.63  Sodium 135 - 145 mmol/L 138 135 131(L)  Potassium 3.5 - 5.1 mmol/L  4.1 3.9 3.9  Chloride 101 - 111 mmol/L 105 101 97(L)  CO2 22 - 32 mmol/L _0 Calcium 8.9 - 10.3 mg/dL 9.9 9.6 9.2  Total Protein 6.5 - 8.1 g/dL 7.1 6.7 6.4(L)  Total Bilirubin 0.3 - 1.2 mg/dL 0.5 0.3 0.4  Alkaline Phos 38 - 126 U/L 87 83 72  AST 15 - 41 U/L _1 ALT 14 - 54 U/L 17 12(L) 13(L)   Normal  B12 and Folate. Iron panel was checked in September showed no signs of iron deficiency  PATHOLOGY 10/27/2017  Surgical Pathology  CASE: [id number]  PATIENT: [facility]  Surgical Pathology Report  SPECIMEN SUBMITTED:  A. Sentinel lymph node #1, left axilla  B. Sentinel lymph nodes #2 and 3, left axilla  C. Breast, left; stitch outer end of skin ellipse, and axillary lymph  nodes  DIAGNOSIS:  A. SENTINEL LYMPH NODE #1, LEFT AXILLA; EXCISION:  - METASTATIC MAMMARY CARCINOMA, ONE LYMPH NODE, 5 MM METASTASIS (1/1).   B. SENTINEL LYMPH NODES #2 AND #3, LEFT AXILLA; EXCISION:  - NEGATIVE FOR MALIGNANCY, THREE LYMPH NODES (0/3).   C. BREAST, LEFT; MODIFIED RADICAL MASTECTOMY:  - INVASIVE MAMMARY CARCINOMA, TWO SITES, SEE SUMMARY BELOW.  - BIOPSY SITE CHANGES AT TWO SITES; ONE MARKER CLIP FOUND.  - METASTATIC MAMMARY CARCINOMA IN TWO OF FOUR AXILLARY LYMPH NODES, WITH  LARGER METASTASIS (8 MM) LOCATED IN NODE WITH MARKER CLIP (2/4).   Comment:  ER, PR, and HER2 were assessed on the previous core biopsies  ([id number], 04/25/2017) with results summarized as follows:  ER IHC (SP1): Both tumors positive, >90%, strong staining  PR IHC (IE2): Both tumors positive, 1:00 tumor 51-90%, mod to strong,  and 4:00 tumor >90% with strong staining  HER2 IHC (HercepTest): Both tumors equivocal, score 2+  HER2 FISH (DAKO IQFISH PharmDX): Both tumors negative (not  amplified)   Surgical Pathology Cancer Case Summary  INVASIVE CARCINOMA OF THE BREAST:  Procedure: Total mastectomy  Specimen Laterality: Left  Histologic Type: Invasive carcinoma of no special type  Histologic Grade (Not[place] Histologic Score)       Glandular (Acinar)/Tubular Differentiation: Score 3       Nuclear Pleomorphism: Score 2       Mitotic Rate: Score 1       Overall Grade: Grade 2  Tumor Size: 23 mm (4-5:00 posterior tumor); 12 mm (1:00 tumor)  Ductal Carcinoma In Situ (DCIS): Present  DCIS Nuclear Grade: Intermediate grade  Extensive intraductal component: Negative for extensive intraductal  component  Margins:       Invasive carcinoma margins: Uninvolved by invasive carcinoma       Distance from closest margin: 0.2 mm, deep       DCIS margins: Uninvolved by DCIS       Distance from closest margin: Cannot be determined; margins  not present in sections containing DCIS  Regional Lymph nodes:    Total # lymph nodes examined: 8    # Sentinel lymph nodes examined: 4    # Lymph nodes with macrometastasis (>2.0 mm): 2    # Lymph nodes with isolated tumor cells (<0.2 mm): 0    # Lymph nodes with micrometastasis (> 0.2 mm and < 2.0 mm): 1    Extranodal extension: Present  Treatment Effect:       In the breast: Definite response to presurgical therapy in the invasive carcinoma       In the lymph nodes: No definite response to presurgical  therapy in metastatic carcinoma  Residual Cancer Burden (RCB):    Primary Tumor Bed  Primary tumor bed:23 mm x 11 mm    Overall cancer cellularity: 0.5%    Percentage of cancer that is in situ: 0%     Lymph nodes    # of lymph nodes positive for metastasis: 3    Diameter of largest metastasis: 8 mm    Residual Cancer Burden (RCB): 2.553       Residual Cancer Burden Class: RCB-II  Lymphovascular Invasion: Cannot be determined with extensive  retraction  changes  Pathologic Stage Classification (pTNM, AJCC 8th Edition): ypT2(m) pN1a  TNM Descriptors: y (posttreatment); m (multiple foci of invasive  carcinoma)    ASSESSMENT/PLAN 67 y.o. 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  S.p post left modified radical mastectomy with ALND, currently on radiation.   1. Malignant neoplasm of upper-outer quadrant of left breast in female, estrogen receptor positive ([place])   2. Malignant neoplasm of lower-outer quadrant of left breast of female, estrogen receptor positive ([place])   3. Breast cancer metastasized to axillary lymph node, left ([place])   4. Neuropathy    # ypT2N1a M0 disease, ER+PR+, HER2-.  # Continue follow up with Dr.Chrystal to complete her course of radiation.  # After completion of RT, she may be eligible for phase III clinic trial with ribociclib with endocrine therapy as adjuvant treatment.  Patient is interested. Research department is aware about her case.   # Port flush Q6-8 weeks scheduled. .  # She meets criteria for Genetic testing, two breast cancer primaries. She has been referred to talk to genetics. She will obtain tests soon.  # Neuropathy: continue Vitamin B6 supplementation. Discussed about adding Neurontin or Cymbalta, she would like to be restarted on B6 first.  Check B12,  Iron panel at next visit.  All questions were answered. The patient knows to call the clinic with any problems, questions or concerns.  Earlie Server, MD, PhD Hematology Oncology [facility] Pager- [phone] 12/24/2017

## 2017-12-24 NOTE — Telephone Encounter (Signed)
error 

## 2017-12-25 ENCOUNTER — Ambulatory Visit
Admission: RE | Admit: 2017-12-25 | Discharge: 2017-12-25 | Disposition: A | Discharge status: Home / Self Care | Payer: BLUE CROSS/BLUE SHIELD | Source: Ambulatory Visit | Attending: Radiation Oncology | Admitting: Radiation Oncology

## 2017-12-25 DIAGNOSIS — Z510 Encounter for antineoplastic radiation therapy: Secondary | ICD-10-CM | POA: Diagnosis not present

## 2017-12-26 ENCOUNTER — Ambulatory Visit
Admission: RE | Admit: 2017-12-26 | Discharge: 2017-12-26 | Disposition: A | Discharge status: Home / Self Care | Payer: BLUE CROSS/BLUE SHIELD | Source: Ambulatory Visit | Attending: Radiation Oncology | Admitting: Radiation Oncology

## 2017-12-26 DIAGNOSIS — Z510 Encounter for antineoplastic radiation therapy: Secondary | ICD-10-CM | POA: Diagnosis not present

## 2017-12-27 ENCOUNTER — Ambulatory Visit
Admission: RE | Admit: 2017-12-27 | Discharge: 2017-12-27 | Disposition: A | Discharge status: Home / Self Care | Payer: BLUE CROSS/BLUE SHIELD | Source: Ambulatory Visit | Attending: Radiation Oncology | Admitting: Radiation Oncology

## 2017-12-27 DIAGNOSIS — Z510 Encounter for antineoplastic radiation therapy: Secondary | ICD-10-CM | POA: Diagnosis not present

## 2017-12-28 ENCOUNTER — Ambulatory Visit
Admission: RE | Admit: 2017-12-28 | Discharge: 2017-12-28 | Disposition: A | Discharge status: Home / Self Care | Payer: BLUE CROSS/BLUE SHIELD | Source: Ambulatory Visit | Attending: Radiation Oncology | Admitting: Radiation Oncology

## 2017-12-28 ENCOUNTER — Other Ambulatory Visit: Payer: Self-pay | Admitting: Genetic Counselor

## 2017-12-28 DIAGNOSIS — Z170 Estrogen receptor positive status [ER+]: Secondary | ICD-10-CM

## 2017-12-28 DIAGNOSIS — Z510 Encounter for antineoplastic radiation therapy: Secondary | ICD-10-CM | POA: Diagnosis not present

## 2017-12-28 DIAGNOSIS — C50412 Malignant neoplasm of upper-outer quadrant of left female breast: Secondary | ICD-10-CM

## 2017-12-28 DIAGNOSIS — C50919 Malignant neoplasm of unspecified site of unspecified female breast: Secondary | ICD-10-CM

## 2017-12-31 ENCOUNTER — Inpatient Hospital Stay: Payer: BLUE CROSS/BLUE SHIELD

## 2017-12-31 ENCOUNTER — Ambulatory Visit
Admission: RE | Admit: 2017-12-31 | Discharge: 2017-12-31 | Disposition: A | Discharge status: Home / Self Care | Payer: BLUE CROSS/BLUE SHIELD | Source: Ambulatory Visit | Attending: Radiation Oncology | Admitting: Radiation Oncology

## 2017-12-31 DIAGNOSIS — C50919 Malignant neoplasm of unspecified site of unspecified female breast: Secondary | ICD-10-CM

## 2017-12-31 DIAGNOSIS — C50812 Malignant neoplasm of overlapping sites of left female breast: Secondary | ICD-10-CM

## 2017-12-31 DIAGNOSIS — C50412 Malignant neoplasm of upper-outer quadrant of left female breast: Secondary | ICD-10-CM | POA: Diagnosis not present

## 2017-12-31 DIAGNOSIS — Z510 Encounter for antineoplastic radiation therapy: Secondary | ICD-10-CM | POA: Diagnosis not present

## 2017-12-31 DIAGNOSIS — Z5111 Encounter for antineoplastic chemotherapy: Secondary | ICD-10-CM

## 2017-12-31 LAB — COMPREHENSIVE METABOLIC PANEL
ALBUMIN: 4.2 g/dL (ref 3.5–5.0)
ALT: 19 U/L (ref 14–54)
AST: 25 U/L (ref 15–41)
Alkaline Phosphatase: 93 U/L (ref 38–126)
Anion gap: 13 (ref 5–15)
BUN: 15 mg/dL (ref 6–20)
CHLORIDE: 98 mmol/L — AB (ref 101–111)
CO2: 21 mmol/L — ABNORMAL LOW (ref 22–32)
Calcium: 9.9 mg/dL (ref 8.9–10.3)
Creatinine, Ser: 0.67 mg/dL (ref 0.44–1.00)
GFR calc Af Amer: >60 mL/min (ref >60)
GLUCOSE: 168 mg/dL — AB (ref 65–99)
POTASSIUM: 3.8 mmol/L (ref 3.5–5.1)
SODIUM: 132 mmol/L — AB (ref 135–145)
Total Bilirubin: 0.5 mg/dL (ref 0.3–1.2)
Total Protein: 7.3 g/dL (ref 6.5–8.1)

## 2017-12-31 LAB — CBC
HCT: 36.4 % (ref 35.0–47.0)
Hemoglobin: 12.4 g/dL (ref 12.0–16.0)
MCH: 27.1 pg (ref 26.0–34.0)
MCHC: 33.9 g/dL (ref 32.0–36.0)
MCV: 79.9 fL — ABNORMAL LOW (ref 80.0–100.0)
PLATELETS: 321 10*3/uL (ref 150–440)
RBC: 4.56 MIL/uL (ref 3.80–5.20)
RDW: 15.7 % — AB (ref 11.5–14.5)
WBC: 9.5 10*3/uL (ref 3.6–11.0)

## 2018-01-01 ENCOUNTER — Ambulatory Visit
Admission: RE | Admit: 2018-01-01 | Discharge: 2018-01-01 | Disposition: A | Discharge status: Home / Self Care | Payer: BLUE CROSS/BLUE SHIELD | Source: Ambulatory Visit | Attending: Radiation Oncology | Admitting: Radiation Oncology

## 2018-01-01 DIAGNOSIS — Z510 Encounter for antineoplastic radiation therapy: Secondary | ICD-10-CM | POA: Diagnosis not present

## 2018-01-02 ENCOUNTER — Inpatient Hospital Stay: Payer: BLUE CROSS/BLUE SHIELD

## 2018-01-02 ENCOUNTER — Ambulatory Visit
Admission: RE | Admit: 2018-01-02 | Discharge: 2018-01-02 | Disposition: A | Discharge status: Home / Self Care | Payer: BLUE CROSS/BLUE SHIELD | Source: Ambulatory Visit | Attending: Radiation Oncology | Admitting: Radiation Oncology

## 2018-01-02 DIAGNOSIS — Z510 Encounter for antineoplastic radiation therapy: Secondary | ICD-10-CM | POA: Diagnosis not present

## 2018-01-03 ENCOUNTER — Ambulatory Visit: Admission: RE | Admit: 2018-01-03 | Payer: BLUE CROSS/BLUE SHIELD | Source: Ambulatory Visit

## 2018-01-04 ENCOUNTER — Other Ambulatory Visit: Payer: Self-pay | Admitting: Genetic Counselor

## 2018-01-04 ENCOUNTER — Ambulatory Visit
Admission: RE | Admit: 2018-01-04 | Discharge: 2018-01-04 | Disposition: A | Discharge status: Home / Self Care | Payer: BLUE CROSS/BLUE SHIELD | Source: Ambulatory Visit | Attending: Radiation Oncology | Admitting: Radiation Oncology

## 2018-01-04 DIAGNOSIS — Z170 Estrogen receptor positive status [ER+]: Principal | ICD-10-CM

## 2018-01-04 DIAGNOSIS — Z510 Encounter for antineoplastic radiation therapy: Secondary | ICD-10-CM | POA: Diagnosis not present

## 2018-01-04 DIAGNOSIS — C50412 Malignant neoplasm of upper-outer quadrant of left female breast: Secondary | ICD-10-CM

## 2018-01-07 ENCOUNTER — Ambulatory Visit
Admission: RE | Admit: 2018-01-07 | Discharge: 2018-01-07 | Disposition: A | Discharge status: Home / Self Care | Payer: BLUE CROSS/BLUE SHIELD | Source: Ambulatory Visit | Attending: Radiation Oncology | Admitting: Radiation Oncology

## 2018-01-07 ENCOUNTER — Inpatient Hospital Stay: Payer: BLUE CROSS/BLUE SHIELD

## 2018-01-07 DIAGNOSIS — Z510 Encounter for antineoplastic radiation therapy: Secondary | ICD-10-CM | POA: Diagnosis not present

## 2018-01-07 DIAGNOSIS — C50512 Malignant neoplasm of lower-outer quadrant of left female breast: Secondary | ICD-10-CM | POA: Diagnosis present

## 2018-01-07 DIAGNOSIS — Z170 Estrogen receptor positive status [ER+]: Secondary | ICD-10-CM | POA: Insufficient documentation

## 2018-01-08 ENCOUNTER — Ambulatory Visit
Admission: RE | Admit: 2018-01-08 | Discharge: 2018-01-08 | Disposition: A | Discharge status: Home / Self Care | Payer: BLUE CROSS/BLUE SHIELD | Source: Ambulatory Visit | Attending: Radiation Oncology | Admitting: Radiation Oncology

## 2018-01-08 DIAGNOSIS — C50512 Malignant neoplasm of lower-outer quadrant of left female breast: Secondary | ICD-10-CM | POA: Diagnosis not present

## 2018-01-09 ENCOUNTER — Ambulatory Visit
Admission: RE | Admit: 2018-01-09 | Discharge: 2018-01-09 | Disposition: A | Discharge status: Home / Self Care | Payer: BLUE CROSS/BLUE SHIELD | Source: Ambulatory Visit | Attending: Radiation Oncology | Admitting: Radiation Oncology

## 2018-01-09 DIAGNOSIS — C50512 Malignant neoplasm of lower-outer quadrant of left female breast: Secondary | ICD-10-CM | POA: Diagnosis not present

## 2018-01-10 ENCOUNTER — Ambulatory Visit
Admission: RE | Admit: 2018-01-10 | Discharge: 2018-01-10 | Disposition: A | Discharge status: Home / Self Care | Payer: BLUE CROSS/BLUE SHIELD | Source: Ambulatory Visit | Attending: Radiation Oncology | Admitting: Radiation Oncology

## 2018-01-10 ENCOUNTER — Encounter: Payer: Self-pay | Admitting: Genetic Counselor

## 2018-01-10 ENCOUNTER — Telehealth: Payer: Self-pay | Admitting: Genetic Counselor

## 2018-01-10 DIAGNOSIS — Z1379 Encounter for other screening for genetic and chromosomal anomalies: Secondary | ICD-10-CM

## 2018-01-10 DIAGNOSIS — C50512 Malignant neoplasm of lower-outer quadrant of left female breast: Secondary | ICD-10-CM | POA: Diagnosis not present

## 2018-01-10 HISTORY — DX: Encounter for other screening for genetic and chromosomal anomalies: Z13.79

## 2018-01-10 NOTE — Telephone Encounter (Signed)
Cancer Genetics             Telegenetics Results Disclosure   Patient Name: Dominique Guzman Patient DOB: 08/06/1951 Patient Age: 67 y.o. Phone Call Date: 01/10/2018  Referring Provider: Earlie Server, MD    [name] was called today to discuss genetic test results. Please see the Genetics telephone note from 11/27/17 for a detailed discussion of her personal and family histories and the recommendations provided.  Genetic Testing: At the time of [name]'s telegenetics visit, she decided to pursue genetic testing of multiple genes associated with hereditary susceptibility to cancer. Testing included sequencing and deletion/duplication analysis. Testing did not reveal a pathogenic mutation in any of the genes analyzed.  A copy of the genetic test report will be scanned into Epic under the Media tab.  The genes analyzed were the 83 genes on [facility]'s Multi-Cancer panel (ALK, APC, ATM, AXIN2, BAP1, BARD1, BLM, BMPR1A, BRCA1, BRCA2, BRIP1, CASR, CDC73, CDH1, CDK4, CDKN1B, CDKN1C, CDKN2A, CEBPA, CHEK2, CTNNA1, DICER1, DIS3L2, EGFR, EPCAM, FH, FLCN, GATA2, GPC3, GREM1, HOXB13, HRAS, KIT, MAX, MEN1, MET, MITF, MLH1, MSH2, MSH3, MSH6, MUTYH, NBN, NF1, NF2, NTHL1, PALB2, PDGFRA, PHOX2B, PMS2, POLD1, POLE, POT1, PRKAR1A, PTCH1, PTEN, RAD50, RAD51C, RAD51D, RB1, RECQL4, RET, RUNX1, SDHA, SDHAF2, SDHB, SDHC, SDHD, SMAD4, SMARCA4, SMARCB1, SMARCE1, STK11, SUFU, TERC, TERT, TMEM127, TP53, TSC1, TSC2, VHL, WRN, WT1).  Since the current test is not perfect, it is possible that there may be a gene mutation that current testing cannot detect, but that chance is small. It is possible that a different genetic factor, which has not yet been discovered or is not on this panel, is responsible for the cancer diagnoses in the family. Again, the likelihood of this is low. No additional testing is recommended at this time for [name].  Cancer Screening: These results suggest that  [name]'s cancers were most likely not due to an inherited predisposition. Most cancers happen by chance and this test, along with details of her family history, suggests that her cancer falls into this category. She is recommended to follow the cancer screening guidelines provided by her physician.   Family Members: Family members are at some increased risk of developing cancer, over the general population risk, simply due to the family history. They are recommended to speak with their own providers about appropriate cancer screenings.  Any relative who had cancer at a young age or had a particularly rare cancer may also wish to pursue genetic testing. Genetic counselors can be located in other cities, by visiting the website of the [employer] (ArtistMovie.se) and Field seismologist for a Dietitian by zip code.   Lastly, cancer genetics is a rapidly advancing field and it is possible that new genetic tests will be appropriate for [name] in the future. We encourage [name] to remain in contact with Genetics on an annual basis so her personal and family histories can be updated.    Steele Berg, MS, [place] Certified Genetic Counselor phone: [phone]

## 2018-01-11 ENCOUNTER — Inpatient Hospital Stay: Payer: BLUE CROSS/BLUE SHIELD | Attending: Oncology

## 2018-01-11 ENCOUNTER — Ambulatory Visit
Admission: RE | Admit: 2018-01-11 | Discharge: 2018-01-11 | Disposition: A | Discharge status: Home / Self Care | Payer: BLUE CROSS/BLUE SHIELD | Source: Ambulatory Visit | Attending: Radiation Oncology | Admitting: Radiation Oncology

## 2018-01-11 DIAGNOSIS — Z452 Encounter for adjustment and management of vascular access device: Secondary | ICD-10-CM | POA: Insufficient documentation

## 2018-01-11 DIAGNOSIS — Z170 Estrogen receptor positive status [ER+]: Secondary | ICD-10-CM | POA: Diagnosis not present

## 2018-01-11 DIAGNOSIS — Z95828 Presence of other vascular implants and grafts: Secondary | ICD-10-CM

## 2018-01-11 DIAGNOSIS — C50512 Malignant neoplasm of lower-outer quadrant of left female breast: Secondary | ICD-10-CM | POA: Diagnosis not present

## 2018-01-11 DIAGNOSIS — C50412 Malignant neoplasm of upper-outer quadrant of left female breast: Secondary | ICD-10-CM | POA: Diagnosis not present

## 2018-01-11 MED ORDER — HEPARIN SOD (PORK) LOCK FLUSH 100 UNIT/ML IV SOLN
500.0000 [IU] | Freq: Once | INTRAVENOUS | Status: AC
  Administered 2018-01-11: 500 [IU] via INTRAVENOUS

## 2018-01-11 MED ORDER — SODIUM CHLORIDE 0.9% FLUSH
10.0000 mL | INTRAVENOUS | Status: DC | PRN
  Administered 2018-01-11: 10 mL via INTRAVENOUS
  Filled 2018-01-11: qty 10

## 2018-01-14 ENCOUNTER — Ambulatory Visit
Admission: RE | Admit: 2018-01-14 | Discharge: 2018-01-14 | Disposition: A | Discharge status: Home / Self Care | Payer: BLUE CROSS/BLUE SHIELD | Source: Ambulatory Visit | Attending: Radiation Oncology | Admitting: Radiation Oncology

## 2018-01-14 DIAGNOSIS — C50512 Malignant neoplasm of lower-outer quadrant of left female breast: Secondary | ICD-10-CM | POA: Diagnosis not present

## 2018-01-15 ENCOUNTER — Ambulatory Visit
Admission: RE | Admit: 2018-01-15 | Discharge: 2018-01-15 | Disposition: A | Discharge status: Home / Self Care | Payer: BLUE CROSS/BLUE SHIELD | Source: Ambulatory Visit | Attending: Radiation Oncology | Admitting: Radiation Oncology

## 2018-01-15 DIAGNOSIS — C50512 Malignant neoplasm of lower-outer quadrant of left female breast: Secondary | ICD-10-CM | POA: Diagnosis not present

## 2018-01-16 ENCOUNTER — Ambulatory Visit
Admission: RE | Admit: 2018-01-16 | Discharge: 2018-01-16 | Disposition: A | Discharge status: Home / Self Care | Payer: BLUE CROSS/BLUE SHIELD | Source: Ambulatory Visit | Attending: Radiation Oncology | Admitting: Radiation Oncology

## 2018-01-16 ENCOUNTER — Inpatient Hospital Stay: Payer: BLUE CROSS/BLUE SHIELD

## 2018-01-16 DIAGNOSIS — C50812 Malignant neoplasm of overlapping sites of left female breast: Secondary | ICD-10-CM

## 2018-01-16 DIAGNOSIS — C50512 Malignant neoplasm of lower-outer quadrant of left female breast: Secondary | ICD-10-CM | POA: Diagnosis not present

## 2018-01-16 LAB — CBC
HCT: 35.2 % (ref 35.0–47.0)
HEMOGLOBIN: 12.1 g/dL (ref 12.0–16.0)
MCH: 27.1 pg (ref 26.0–34.0)
MCHC: 34.5 g/dL (ref 32.0–36.0)
MCV: 78.4 fL — ABNORMAL LOW (ref 80.0–100.0)
PLATELETS: 297 10*3/uL (ref 150–440)
RBC: 4.48 MIL/uL (ref 3.80–5.20)
RDW: 16 % — ABNORMAL HIGH (ref 11.5–14.5)
WBC: 9.7 10*3/uL (ref 3.6–11.0)

## 2018-01-17 ENCOUNTER — Ambulatory Visit
Admission: RE | Admit: 2018-01-17 | Discharge: 2018-01-17 | Disposition: A | Discharge status: Home / Self Care | Payer: BLUE CROSS/BLUE SHIELD | Source: Ambulatory Visit | Attending: Radiation Oncology | Admitting: Radiation Oncology

## 2018-01-17 DIAGNOSIS — C50512 Malignant neoplasm of lower-outer quadrant of left female breast: Secondary | ICD-10-CM | POA: Diagnosis not present

## 2018-01-18 ENCOUNTER — Ambulatory Visit
Admission: RE | Admit: 2018-01-18 | Discharge: 2018-01-18 | Disposition: A | Discharge status: Home / Self Care | Payer: BLUE CROSS/BLUE SHIELD | Source: Ambulatory Visit | Attending: Radiation Oncology | Admitting: Radiation Oncology

## 2018-01-18 DIAGNOSIS — C50512 Malignant neoplasm of lower-outer quadrant of left female breast: Secondary | ICD-10-CM | POA: Diagnosis not present

## 2018-01-21 ENCOUNTER — Other Ambulatory Visit: Payer: Self-pay | Admitting: *Deleted

## 2018-01-21 ENCOUNTER — Ambulatory Visit
Admission: RE | Admit: 2018-01-21 | Discharge: 2018-01-21 | Disposition: A | Discharge status: Home / Self Care | Payer: BLUE CROSS/BLUE SHIELD | Source: Ambulatory Visit | Attending: Radiation Oncology | Admitting: Radiation Oncology

## 2018-01-21 DIAGNOSIS — C50512 Malignant neoplasm of lower-outer quadrant of left female breast: Secondary | ICD-10-CM | POA: Diagnosis not present

## 2018-01-21 MED ORDER — SILVER SULFADIAZINE 1 % EX CREA: 1.0000 "application " | TOPICAL_CREAM | Freq: Every day | CUTANEOUS | 0 refills | Status: DC

## 2018-01-22 ENCOUNTER — Ambulatory Visit
Admission: RE | Admit: 2018-01-22 | Discharge: 2018-01-22 | Disposition: A | Discharge status: Home / Self Care | Payer: BLUE CROSS/BLUE SHIELD | Source: Ambulatory Visit | Attending: Radiation Oncology | Admitting: Radiation Oncology

## 2018-01-22 DIAGNOSIS — C50512 Malignant neoplasm of lower-outer quadrant of left female breast: Secondary | ICD-10-CM | POA: Diagnosis not present

## 2018-01-23 ENCOUNTER — Ambulatory Visit
Admission: RE | Admit: 2018-01-23 | Discharge: 2018-01-23 | Disposition: A | Discharge status: Home / Self Care | Payer: BLUE CROSS/BLUE SHIELD | Source: Ambulatory Visit | Attending: Radiation Oncology | Admitting: Radiation Oncology

## 2018-01-24 ENCOUNTER — Ambulatory Visit
Admission: RE | Admit: 2018-01-24 | Discharge: 2018-01-24 | Disposition: A | Discharge status: Home / Self Care | Payer: BLUE CROSS/BLUE SHIELD | Source: Ambulatory Visit | Attending: Radiation Oncology | Admitting: Radiation Oncology

## 2018-01-24 DIAGNOSIS — C50512 Malignant neoplasm of lower-outer quadrant of left female breast: Secondary | ICD-10-CM | POA: Diagnosis not present

## 2018-01-25 ENCOUNTER — Ambulatory Visit
Admission: RE | Admit: 2018-01-25 | Discharge: 2018-01-25 | Disposition: A | Discharge status: Home / Self Care | Payer: BLUE CROSS/BLUE SHIELD | Source: Ambulatory Visit | Attending: Radiation Oncology | Admitting: Radiation Oncology

## 2018-01-25 DIAGNOSIS — C50512 Malignant neoplasm of lower-outer quadrant of left female breast: Secondary | ICD-10-CM | POA: Diagnosis not present

## 2018-01-28 ENCOUNTER — Ambulatory Visit
Admission: RE | Admit: 2018-01-28 | Discharge: 2018-01-28 | Disposition: A | Discharge status: Home / Self Care | Payer: BLUE CROSS/BLUE SHIELD | Source: Ambulatory Visit | Attending: Radiation Oncology | Admitting: Radiation Oncology

## 2018-01-28 DIAGNOSIS — C50512 Malignant neoplasm of lower-outer quadrant of left female breast: Secondary | ICD-10-CM | POA: Diagnosis not present

## 2018-01-29 ENCOUNTER — Ambulatory Visit
Admission: RE | Admit: 2018-01-29 | Discharge: 2018-01-29 | Disposition: A | Discharge status: Home / Self Care | Payer: BLUE CROSS/BLUE SHIELD | Source: Ambulatory Visit | Attending: Radiation Oncology | Admitting: Radiation Oncology

## 2018-01-29 DIAGNOSIS — C50512 Malignant neoplasm of lower-outer quadrant of left female breast: Secondary | ICD-10-CM | POA: Diagnosis not present

## 2018-01-30 ENCOUNTER — Other Ambulatory Visit: Payer: Self-pay

## 2018-01-30 ENCOUNTER — Inpatient Hospital Stay: Payer: BLUE CROSS/BLUE SHIELD

## 2018-01-30 ENCOUNTER — Ambulatory Visit
Admission: RE | Admit: 2018-01-30 | Discharge: 2018-01-30 | Disposition: A | Discharge status: Home / Self Care | Payer: BLUE CROSS/BLUE SHIELD | Source: Ambulatory Visit | Attending: Radiation Oncology | Admitting: Radiation Oncology

## 2018-01-30 DIAGNOSIS — C50512 Malignant neoplasm of lower-outer quadrant of left female breast: Secondary | ICD-10-CM

## 2018-01-30 DIAGNOSIS — Z170 Estrogen receptor positive status [ER+]: Principal | ICD-10-CM

## 2018-01-30 LAB — CBC WITH DIFFERENTIAL/PLATELET
Basophils Absolute: 0.1 10*3/uL (ref 0–0.1)
Basophils Relative: 1 %
EOS ABS: 0.3 10*3/uL (ref 0–0.7)
Eosinophils Relative: 4 %
HCT: 34.8 % — ABNORMAL LOW (ref 35.0–47.0)
HEMOGLOBIN: 11.9 g/dL — AB (ref 12.0–16.0)
LYMPHS ABS: 1.1 10*3/uL (ref 1.0–3.6)
LYMPHS PCT: 12 %
MCH: 27.3 pg (ref 26.0–34.0)
MCHC: 34.3 g/dL (ref 32.0–36.0)
MCV: 79.6 fL — ABNORMAL LOW (ref 80.0–100.0)
Monocytes Absolute: 1 10*3/uL — ABNORMAL HIGH (ref 0.2–0.9)
Monocytes Relative: 11 %
NEUTROS ABS: 6.3 10*3/uL (ref 1.4–6.5)
NEUTROS PCT: 72 %
Platelets: 283 10*3/uL (ref 150–440)
RBC: 4.37 MIL/uL (ref 3.80–5.20)
RDW: 16.9 % — ABNORMAL HIGH (ref 11.5–14.5)
WBC: 8.7 10*3/uL (ref 3.6–11.0)

## 2018-01-31 ENCOUNTER — Ambulatory Visit
Admission: RE | Admit: 2018-01-31 | Discharge: 2018-01-31 | Disposition: A | Discharge status: Home / Self Care | Payer: BLUE CROSS/BLUE SHIELD | Source: Ambulatory Visit | Attending: Radiation Oncology | Admitting: Radiation Oncology

## 2018-01-31 DIAGNOSIS — C50512 Malignant neoplasm of lower-outer quadrant of left female breast: Secondary | ICD-10-CM | POA: Diagnosis not present

## 2018-02-01 ENCOUNTER — Ambulatory Visit
Admission: RE | Admit: 2018-02-01 | Discharge: 2018-02-01 | Disposition: A | Discharge status: Home / Self Care | Payer: BLUE CROSS/BLUE SHIELD | Source: Ambulatory Visit | Attending: Radiation Oncology | Admitting: Radiation Oncology

## 2018-02-01 DIAGNOSIS — C50512 Malignant neoplasm of lower-outer quadrant of left female breast: Secondary | ICD-10-CM | POA: Diagnosis not present

## 2018-02-02 ENCOUNTER — Ambulatory Visit: Payer: BLUE CROSS/BLUE SHIELD

## 2018-02-04 ENCOUNTER — Ambulatory Visit: Payer: BLUE CROSS/BLUE SHIELD

## 2018-02-04 ENCOUNTER — Ambulatory Visit
Admission: RE | Admit: 2018-02-04 | Discharge: 2018-02-04 | Disposition: A | Discharge status: Home / Self Care | Payer: BLUE CROSS/BLUE SHIELD | Source: Ambulatory Visit | Attending: Radiation Oncology | Admitting: Radiation Oncology

## 2018-02-04 DIAGNOSIS — C50512 Malignant neoplasm of lower-outer quadrant of left female breast: Secondary | ICD-10-CM | POA: Diagnosis not present

## 2018-02-05 ENCOUNTER — Ambulatory Visit
Admission: RE | Admit: 2018-02-05 | Discharge: 2018-02-05 | Disposition: A | Discharge status: Home / Self Care | Payer: BLUE CROSS/BLUE SHIELD | Source: Ambulatory Visit | Attending: Radiation Oncology | Admitting: Radiation Oncology

## 2018-02-05 ENCOUNTER — Ambulatory Visit: Payer: BLUE CROSS/BLUE SHIELD

## 2018-02-05 ENCOUNTER — Ambulatory Visit: Admission: RE | Admit: 2018-02-05 | Payer: BLUE CROSS/BLUE SHIELD | Source: Ambulatory Visit

## 2018-02-05 DIAGNOSIS — C50512 Malignant neoplasm of lower-outer quadrant of left female breast: Secondary | ICD-10-CM | POA: Diagnosis not present

## 2018-02-06 ENCOUNTER — Ambulatory Visit
Admission: RE | Admit: 2018-02-06 | Discharge: 2018-02-06 | Disposition: A | Discharge status: Home / Self Care | Payer: BLUE CROSS/BLUE SHIELD | Source: Ambulatory Visit | Attending: Radiation Oncology | Admitting: Radiation Oncology

## 2018-02-06 ENCOUNTER — Inpatient Hospital Stay: Payer: BLUE CROSS/BLUE SHIELD | Attending: Oncology

## 2018-02-06 ENCOUNTER — Other Ambulatory Visit: Payer: Self-pay

## 2018-02-06 ENCOUNTER — Encounter: Payer: Self-pay | Admitting: Oncology

## 2018-02-06 ENCOUNTER — Ambulatory Visit: Payer: BLUE CROSS/BLUE SHIELD

## 2018-02-06 ENCOUNTER — Inpatient Hospital Stay (HOSPITAL_BASED_OUTPATIENT_CLINIC_OR_DEPARTMENT_OTHER): Payer: BLUE CROSS/BLUE SHIELD | Admitting: Oncology

## 2018-02-06 VITALS — BP 150/82 | HR 81 | Temp 98.1°F | Wt 197.9 lb

## 2018-02-06 DIAGNOSIS — Z8542 Personal history of malignant neoplasm of other parts of uterus: Secondary | ICD-10-CM

## 2018-02-06 DIAGNOSIS — Z79811 Long term (current) use of aromatase inhibitors: Secondary | ICD-10-CM | POA: Insufficient documentation

## 2018-02-06 DIAGNOSIS — G629 Polyneuropathy, unspecified: Secondary | ICD-10-CM | POA: Insufficient documentation

## 2018-02-06 DIAGNOSIS — D649 Anemia, unspecified: Secondary | ICD-10-CM | POA: Diagnosis not present

## 2018-02-06 DIAGNOSIS — C50412 Malignant neoplasm of upper-outer quadrant of left female breast: Secondary | ICD-10-CM | POA: Diagnosis not present

## 2018-02-06 DIAGNOSIS — C773 Secondary and unspecified malignant neoplasm of axilla and upper limb lymph nodes: Secondary | ICD-10-CM

## 2018-02-06 DIAGNOSIS — G4730 Sleep apnea, unspecified: Secondary | ICD-10-CM | POA: Insufficient documentation

## 2018-02-06 DIAGNOSIS — Z9012 Acquired absence of left breast and nipple: Secondary | ICD-10-CM | POA: Diagnosis not present

## 2018-02-06 DIAGNOSIS — Z923 Personal history of irradiation: Secondary | ICD-10-CM | POA: Insufficient documentation

## 2018-02-06 DIAGNOSIS — M8580 Other specified disorders of bone density and structure, unspecified site: Secondary | ICD-10-CM

## 2018-02-06 DIAGNOSIS — Z170 Estrogen receptor positive status [ER+]: Secondary | ICD-10-CM | POA: Insufficient documentation

## 2018-02-06 DIAGNOSIS — Z7984 Long term (current) use of oral hypoglycemic drugs: Secondary | ICD-10-CM

## 2018-02-06 DIAGNOSIS — Z9989 Dependence on other enabling machines and devices: Secondary | ICD-10-CM | POA: Diagnosis not present

## 2018-02-06 DIAGNOSIS — E119 Type 2 diabetes mellitus without complications: Secondary | ICD-10-CM | POA: Diagnosis not present

## 2018-02-06 DIAGNOSIS — Z510 Encounter for antineoplastic radiation therapy: Secondary | ICD-10-CM | POA: Diagnosis not present

## 2018-02-06 DIAGNOSIS — C50812 Malignant neoplasm of overlapping sites of left female breast: Secondary | ICD-10-CM | POA: Diagnosis present

## 2018-02-06 DIAGNOSIS — Z79899 Other long term (current) drug therapy: Secondary | ICD-10-CM | POA: Diagnosis not present

## 2018-02-06 DIAGNOSIS — Z803 Family history of malignant neoplasm of breast: Secondary | ICD-10-CM | POA: Insufficient documentation

## 2018-02-06 DIAGNOSIS — C50912 Malignant neoplasm of unspecified site of left female breast: Secondary | ICD-10-CM

## 2018-02-06 DIAGNOSIS — M1990 Unspecified osteoarthritis, unspecified site: Secondary | ICD-10-CM

## 2018-02-06 DIAGNOSIS — Z90710 Acquired absence of both cervix and uterus: Secondary | ICD-10-CM | POA: Diagnosis not present

## 2018-02-06 DIAGNOSIS — Z9221 Personal history of antineoplastic chemotherapy: Secondary | ICD-10-CM | POA: Diagnosis not present

## 2018-02-06 DIAGNOSIS — Z452 Encounter for adjustment and management of vascular access device: Secondary | ICD-10-CM | POA: Insufficient documentation

## 2018-02-06 DIAGNOSIS — I10 Essential (primary) hypertension: Secondary | ICD-10-CM

## 2018-02-06 DIAGNOSIS — R5381 Other malaise: Secondary | ICD-10-CM

## 2018-02-06 DIAGNOSIS — R5383 Other fatigue: Secondary | ICD-10-CM | POA: Diagnosis not present

## 2018-02-06 DIAGNOSIS — C50512 Malignant neoplasm of lower-outer quadrant of left female breast: Secondary | ICD-10-CM

## 2018-02-06 DIAGNOSIS — R718 Other abnormality of red blood cells: Secondary | ICD-10-CM

## 2018-02-06 LAB — FERRITIN: Ferritin: 25 ng/mL (ref 11–307)

## 2018-02-06 LAB — VITAMIN B12: VITAMIN B 12: 629 pg/mL (ref 180–914)

## 2018-02-06 NOTE — Progress Notes (Signed)
[facility] Follow Up Visit.  Patient Care Team: Glendon Axe, MD as PCP - General (Internal Medicine)  Reason for visit Follow up for treatment of breast cancer.   Pertinent Oncology History Dominique Guzman 67 y.o. female with PMH listed as below is referred by Dr.Schermerhorn to [name] for evaluation and management of newly diagnosed breast cancer. Patient had a history a left breast mass biopsy followed by breast excisonal biopsy in 2015 with benign pathology.  She also has a history of Uterine cancer which was treated with RT with Dr.Chrystal.  She underwent routine screening mammogram on 04/17/2017 which recommended a possible mass on the left breast. Left diagnostic mammogram showed a highly suspicious left breast mass at 4 o'clock, 7 cm from the nipple and 1 o'clock,,  About 3 cm from the nipple. Enlarged left axillary lymph node suspicious for metastatic disease.  1 US breast confirmed a left breast mass of 2.1cm, 4 o'clock, and 1.1 cm mass at 1 o'clock.The masses at 1 and 4 o'clock are separated by approximately 4 cm sonographically 2 Biopsy of the two mass and axillary lymph node revealed invasive carcinoma, grade 2. Both are ER,PR positive. HER2 equivocal, FISH pending. 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  3 05/14/2017 MRI breast showed Two sites of biopsy proven malignancy seen in the left breast, which span up to 11.3 cm. There are intervening areas of non mass enhancement and a small suspicious enhancing mass. 4 Case was discussed on breast tumor board on 05/21/2017 and the panel agree with neoadjuvant ddAC to T followed by surgery. Clinically cT3N1M0  #Cancer treatment  patient completed s/p 4 cycles ddAC and weekly taxol x 12, on 10/26/2017 patient underwent that surgery.  Left axillary sentinel lymph node was positive, so Dr. Tamala Julian proceeded with left mastectomy and left axillary lymph node dissection. Pathology showed ypT2(m) pN1a, ER/PR  positive, HER2 FISH negative, grade 2, negative margin, invasive mammary carcinoma.   # Genetic testing Testing did not reveal a pathogenic mutation in any of the genes analyzed. A copy of the genetic test report will be scanned into Epic under the Media tab.The genes analyzed were the 83 genes on [facility]'s Multi-Cancer panel (ALK, APC, ATM, AXIN2, BAP1, BARD1, BLM, BMPR1A, BRCA1, BRCA2, BRIP1, CASR, CDC73, CDH1, CDK4, CDKN1B, CDKN1C, CDKN2A, CEBPA, CHEK2, CTNNA1, DICER1, DIS3L2, EGFR, EPCAM, FH, FLCN, GATA2, GPC3, GREM1, HOXB13, HRAS, KIT, MAX, MEN1, MET, MITF, MLH1, MSH2, MSH3, MSH6, MUTYH, NBN, NF1, NF2, NTHL1, PALB2, PDGFRA, PHOX2B, PMS2, POLD1, POLE, POT1, PRKAR1A, PTCH1, PTEN, RAD50, RAD51C, RAD51D, RB1, RECQL4, RET, RUNX1, SDHA, SDHAF2, SDHB, SDHC, SDHD, SMAD4, SMARCA4, SMARCB1, SMARCE1, STK11, SUFU, TERC, TERT, TMEM127, TP53, TSC1, TSC2, VHL, WRN, WT1).    INTERVAL HISTORY Patient presents for follow up for management of breast cancer ypT2 yp N1. She is currently undergoing adjuvant radiation and anticipate to finish next wee.  She reports having skin soreness after Radiation. Mild fatigue. Otherwise doing well.   Denies any fever or chills.  She reports mild but persistent numbness and tingling with her fingertips, not interfering with her daily activity..  Review of Systems  Constitutional: Positive for malaise/fatigue. Negative for chills, fever and weight loss.  HENT: Negative for congestion, ear discharge, ear pain, nosebleeds, sinus pain and sore throat.   Eyes: Negative for double vision, photophobia, pain, discharge and redness.  Respiratory: Negative for cough, hemoptysis, sputum production, shortness of breath and wheezing.   Cardiovascular: Negative for chest pain, palpitations, orthopnea, claudication and leg  swelling.  Gastrointestinal: Negative for abdominal pain, blood in stool, constipation, diarrhea, heartburn, melena, nausea and vomiting.  Genitourinary: Negative for  dysuria, flank pain, frequency and hematuria.  Musculoskeletal: Negative for back pain, myalgias and neck pain.  Skin: Negative for itching and rash.       Radiation changes.   Neurological: Negative for dizziness, tingling, tremors, focal weakness, weakness and headaches.  Endo/Heme/Allergies: Negative for environmental allergies. Does not bruise/bleed easily.  Psychiatric/Behavioral: Negative for depression, hallucinations, substance abuse and suicidal ideas. The patient is not nervous/anxious.      MEDICAL HISTORY: Past Medical History:  Diagnosis Date  . Anemia   . Arthritis    HIP-LEFT  . Benign neoplasm of colon   . Breast cancer ([place])   . Cancer ([place])   . Diabetes mellitus without complication ([place])   . Genetic testing 01/10/2018   Multi-Cancer panel (83 genes) @ [facility] - No pathogenic mutations detected  . H/O osteopenia   . History of hiatal hernia   . Hypertension   . Sleep apnea    USE CPAP  . Uterine cancer [facility])     SURGICAL HISTORY: Past Surgical History:  Procedure Laterality Date  . ABDOMINAL HYSTERECTOMY    . BREAST BIOPSY Left 04/2014   negative  . BREAST EXCISIONAL BIOPSY Left 2015  . CESAREAN SECTION     X2  . COLONOSCOPY WITH PROPOFOL N/A 04/15/2015   Procedure: COLONOSCOPY WITH PROPOFOL;  Surgeon: Hulen Luster, MD;  Location: [facility] ENDOSCOPY;  Service: Gastroenterology;  Laterality: N/A;  . HERNIA REPAIR    . MASTECTOMY MODIFIED RADICAL Left 10/26/2017   Procedure: MASTECTOMY MODIFIED RADICAL;  Surgeon: Leonie Green, MD;  Location: [facility] ORS;  Service: General;  Laterality: Left;  [name] MASTECTOMY W/ SENTINEL NODE BIOPSY Left 10/26/2017   Procedure: MASTECTOMY WITH SENTINEL LYMPH NODE BIOPSY;  Surgeon: Leonie Green, MD;  Location: [facility] ORS;  Service: General;  Laterality: Left;  . PORTACATH PLACEMENT Right 05/22/2017   Procedure: INSERTION PORT-A-CATH;  Surgeon: Leonie Green, MD;  Location: [facility] ORS;  Service: General;  Laterality: Right;     SOCIAL HISTORY: Social History   Socioeconomic History  . Marital status: Married    Spouse name: Not on file  . Number of children: Not on file  . Years of education: Not on file  . Highest education level: Not on file  Occupational History  . Not on file  Social Needs  . Financial resource strain: Not on file  . Food insecurity:    Worry: Not on file    Inability: Not on file  . Transportation needs:    Medical: Not on file    Non-medical: Not on file  Tobacco Use  . Smoking status: Never Smoker  . Smokeless tobacco: Never Used  Substance and Sexual Activity  . Alcohol use: No  . Drug use: No  . Sexual activity: Not on file  Lifestyle  . Physical activity:    Days per week: Not on file    Minutes per session: Not on file  . Stress: Not on file  Relationships  . Social connections:    Talks on phone: Not on file    Gets together: Not on file    Attends religious service: Not on file    Active member of club or organization: Not on file    Attends meetings of clubs or organizations: Not on file    Relationship status: Not on file  . Intimate partner violence:  Fear of current or ex partner: Not on file    Emotionally abused: Not on file    Physically abused: Not on file    Forced sexual activity: Not on file  Other Topics Concern  . Not on file  Social History Narrative  . Not on file    FAMILY HISTORY Family History  Problem Relation Age of Onset  . Diabetes Mother   . Diabetes Father   . Melanoma Father 57       on finger  . Diabetes Sister   . Pancreatic cancer Paternal Grandfather        deceased 85s  . Breast cancer Other 46       paternal grandmother's mother    ALLERGIES:  has No Known Allergies.  MEDICATIONS:  Current Outpatient Medications  Medication Sig Dispense Refill  . Calcium Carb-Cholecalciferol (CALCIUM 600-D PO) Take 1 tablet by mouth daily.    . Cinnamon 500 MG capsule Take 1,000 mg by mouth daily.     . cyanocobalamin  1000 MCG tablet Take 1,000 mcg by mouth daily.    . diphenhydrAMINE (BENADRYL) 25 MG tablet Take 25 mg by mouth every 6 (six) hours as needed for allergies.     [name] FIBER PO Take 2 each by mouth daily.     . fluticasone (FLONASE) 50 MCG/ACT nasal spray Place 2 sprays into both nostrils daily as needed for allergies.     [name] GLUCOSAMINE-CHONDROITIN PO Take 1 tablet by mouth daily.    [name] HYDROcodone-acetaminophen (NORCO/VICODIN) 5-325 MG tablet Take 1-2 tablets by mouth every 6 (six) hours as needed for moderate pain. 16 tablet 0  . lidocaine-prilocaine (EMLA) cream Apply 1 application topically as needed. Apply small amount to port site at least 1 hour prior to it being accessed, cover with plastic wrap 30 g 3  . lisinopril-hydrochlorothiazide (PRINZIDE,ZESTORETIC) 20-12.5 MG per tablet Take 1 tablet by mouth daily.    [name] loperamide (IMODIUM) 2 MG capsule Take 1 capsule (2 mg total) by mouth See admin instructions. With onset of loose stool, take 76m followed by 237mevery 2 hours until 12 hours have passed without loose bowel movement. Maximum: 16 mg/day 120 capsule 1  . metFORMIN (GLUCOPHAGE) 500 MG tablet Take 1,000 mg by mouth 2 (two) times daily.     . Multiple Vitamin (MULTIVITAMIN) tablet Take 1 tablet by mouth daily.    . ondansetron (ZOFRAN) 8 MG tablet Take 1 tablet (8 mg total) by mouth every 8 (eight) hours as needed for nausea or vomiting. 120 tablet 0  . pyridOXINE (VITAMIN B-6) 100 MG tablet Take 1 tablet (100 mg total) by mouth daily. 90 tablet 3  . silver sulfADIAZINE (SILVADENE) 1 % cream Apply 1 application topically daily. 50 g 0  . sitaGLIPtin (JANUVIA) 100 MG tablet Take 100 mg by mouth daily.    . [name] Kitchenriamcinolone cream (KENALOG) 0.1 % Apply 1 application topically daily as needed (for rash).    . chlorhexidine (PERIDEX) 0.12 % solution USE AS DIRECTED 15 ML IN THE MOUTH OR THROAT TWICE A DAY (Patient not taking: Reported on 02/06/2018) 473 mL 0  . valACYclovir (VALTREX) 1000 MG tablet  Take 1 tablet (1,000 mg total) by mouth 2 (two) times daily. (Patient not taking: Reported on 02/06/2018) 14 tablet 0   No current facility-administered medications for this visit.     PHYSICAL EXAMINATION:  ECOG PERFORMANCE STATUS: 1 - Symptomatic but completely ambulatory  Vitals:   02/06/18 1010  BP: (!) 150/82  Pulse: 81  Temp: 98.1 F (36.7 C)    Filed Weights   02/06/18 1010  Weight: 197 lb 14.4 oz (89.8 kg)     Physical Exam  Constitutional: She is oriented to person, place, and time and well-developed, well-nourished, and in no distress. No distress.  HENT:  Head: Normocephalic and atraumatic.  Right Ear: External ear normal.  Left Ear: External ear normal.  Nose: Nose normal.  Mouth/Throat: Oropharynx is clear and moist. No oropharyngeal exudate.  Eyes: Pupils are equal, round, and reactive to light. Conjunctivae and EOM are normal. Right eye exhibits no discharge. Left eye exhibits no discharge. No scleral icterus.  Neck: Normal range of motion. Neck supple. No JVD present.  Cardiovascular: Normal rate, regular rhythm and normal heart sounds.  No murmur heard. Pulmonary/Chest: Effort normal and breath sounds normal. No respiratory distress. She has no wheezes. She has no rales. She exhibits no tenderness.  Abdominal: Soft. Bowel sounds are normal. She exhibits no distension and no mass. There is no tenderness. There is no rebound and no guarding.  Musculoskeletal: Normal range of motion. She exhibits no edema or tenderness.  Lymphadenopathy:    She has no cervical adenopathy.  Neurological: She is alert and oriented to person, place, and time. No cranial nerve deficit. She exhibits normal muscle tone. Coordination normal.  Skin: Skin is warm and dry. No rash noted. She is not diaphoretic. No erythema.  Psychiatric: Affect and judgment normal.  ECOG 0  LABORATORY DATA: CBC Latest Ref Rng & Units 01/30/2018 01/16/2018 12/31/2017  WBC 3.6 - 11.0 K/uL 8.7 9.7 9.5   Hemoglobin 12.0 - 16.0 g/dL 11.9(L) 12.1 12.4  Hematocrit 35.0 - 47.0 % 34.8(L) 35.2 36.4  Platelets 150 - 440 K/uL 283 297 321   CMP Latest Ref Rng & Units 12/31/2017 12/24/2017 11/26/2017  Glucose 65 - 99 mg/dL 168(H) 147(H) 122(H)  BUN 6 - 20 mg/dL _0 Creatinine 0.44 - 1.00 mg/dL 0.67 0.64 0.60  Sodium 135 - 145 mmol/L 132(L) 138 135  Potassium 3.5 - 5.1 mmol/L 3.8 4.1 3.9  Chloride 101 - 111 mmol/L 98(L) 105 101  CO2 22 - 32 mmol/L 21(L) 24 28  Calcium 8.9 - 10.3 mg/dL 9.9 9.9 9.6  Total Protein 6.5 - 8.1 g/dL 7.3 7.1 6.7  Total Bilirubin 0.3 - 1.2 mg/dL 0.5 0.5 0.3  Alkaline Phos 38 - 126 U/L 93 87 83  AST 15 - 41 U/L _1 ALT 14 - 54 U/L 19 17 12(L)   Normal  B12 and Folate. Iron panel was checked in September showed no signs of iron deficiency  PATHOLOGY 10/27/2017  Surgical Pathology  CASE: [id number]  PATIENT: [facility]  Surgical Pathology Report  SPECIMEN SUBMITTED:  A. Sentinel lymph node #1, left axilla  B. Sentinel lymph nodes #2 and 3, left axilla  C. Breast, left; stitch outer end of skin ellipse, and axillary lymph  nodes  DIAGNOSIS:  A. SENTINEL LYMPH NODE #1, LEFT AXILLA; EXCISION:  - METASTATIC MAMMARY CARCINOMA, ONE LYMPH NODE, 5 MM METASTASIS (1/1).   B. SENTINEL LYMPH NODES #2 AND #3, LEFT AXILLA; EXCISION:  - NEGATIVE FOR MALIGNANCY, THREE LYMPH NODES (0/3).   C. BREAST, LEFT; MODIFIED RADICAL MASTECTOMY:  - INVASIVE MAMMARY CARCINOMA, TWO SITES, SEE SUMMARY BELOW.  - BIOPSY SITE CHANGES AT TWO SITES; ONE MARKER CLIP FOUND.  - METASTATIC MAMMARY CARCINOMA IN TWO OF FOUR AXILLARY LYMPH NODES, WITH  LARGER METASTASIS (8 MM) LOCATED IN NODE  WITH MARKER CLIP (2/4).   Comment:  ER, PR, and HER2 were assessed on the previous core biopsies  ([id number], 04/25/2017) with results summarized as follows:  ER IHC (SP1): Both tumors positive, >90%, strong staining  PR IHC (IE2): Both tumors positive, 1:00 tumor 51-90%, mod to strong,   and 4:00 tumor >90% with strong staining  HER2 IHC (HercepTest): Both tumors equivocal, score 2+  HER2 FISH (DAKO IQFISH PharmDX): Both tumors negative (not amplified)   Surgical Pathology Cancer Case Summary  INVASIVE CARCINOMA OF THE BREAST:  Procedure: Total mastectomy  Specimen Laterality: Left  Histologic Type: Invasive carcinoma of no special type  Histologic Grade ([place] Histologic Score)       Glandular (Acinar)/Tubular Differentiation: Score 3       Nuclear Pleomorphism: Score 2       Mitotic Rate: Score 1       Overall Grade: Grade 2  Tumor Size: 23 mm (4-5:00 posterior tumor); 12 mm (1:00 tumor)  Ductal Carcinoma In Situ (DCIS): Present  DCIS Nuclear Grade: Intermediate grade  Extensive intraductal component: Negative for extensive intraductal  component  Margins:       Invasive carcinoma margins: Uninvolved by invasive carcinoma       Distance from closest margin: 0.2 mm, deep       DCIS margins: Uninvolved by DCIS       Distance from closest margin: Cannot be determined; margins  not present in sections containing DCIS  Regional Lymph nodes:    Total # lymph nodes examined: 8    # Sentinel lymph nodes examined: 4    # Lymph nodes with macrometastasis (>2.0 mm): 2    # Lymph nodes with isolated tumor cells (<0.2 mm): 0    # Lymph nodes with micrometastasis (> 0.2 mm and < 2.0 mm): 1    Extranodal extension: Present  Treatment Effect:       In the breast: Definite response to presurgical therapy in the invasive carcinoma       In the lymph nodes: No definite response to presurgical therapy in metastatic carcinoma  Residual Cancer Burden (RCB):    Primary Tumor Bed  Primary tumor bed:23 mm x 11 mm    Overall cancer cellularity: 0.5%    Percentage of cancer that is in situ: 0%     Lymph nodes    # of lymph nodes positive for metastasis: 3    Diameter of largest metastasis:  8 mm    Residual Cancer Burden (RCB): 2.553       Residual Cancer Burden Class: RCB-II  Lymphovascular Invasion: Cannot be determined with extensive retraction  changes  Pathologic Stage Classification (pTNM, AJCC 8th Edition): ypT2(m) pN1a  TNM Descriptors: y (posttreatment); m (multiple foci of invasive  carcinoma)    ASSESSMENT/PLAN 67 y.o. 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  S.p post left modified radical mastectomy with ALND, currently on radiation.   1. Malignant neoplasm of upper-outer quadrant of left breast in female, estrogen receptor positive ([place])   2. 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 RBC microcytosis    # ypT2N1a M0 disease, ER+PR+, HER2-.  # Continue follow up with Dr.Chrystal to complete her course of radiation.  # Lengthy discussion with patient about post adjuvant chemo and RT plan.  Standard treatment will be antihormone therapy. Rationale and  side effects including hot flushes, joint pain, bone loss, etc discussed with patient.  Also discussed about rationale and side effects of prophylactic bisphosphonate treatment with Zometa Q 6 months to reduce skeletal events.   # She is a good candidate for NATALEE trial and maybe ABC trial as well. Discussed with patient and she would like to know related information and then decide. I have talked to clinical trial coordinator Ulis Rias and research team will call her.   Patient knows to call us and inform us about her decision. If she declines clinical trial, will start her on AI.  # Port flush Q6-8 weeks scheduled. .  # Genetic testing is negative.  # RBC microcytosis, mild anemia: Check B12,  Iron panel , results pending.   All questions were answered. The patient knows to call the clinic with any problems, questions or  concerns. Total face to face encounter time for this patient visit was 25 min. >50% of the time was  spent in counseling and coordination of care.  Earlie Server, MD, PhD Hematology Oncology [facility] Pager- [phone] 02/06/2018

## 2018-02-06 NOTE — Progress Notes (Signed)
Patient here for follow up. No concerns voiced.  °

## 2018-02-07 ENCOUNTER — Ambulatory Visit
Admission: RE | Admit: 2018-02-07 | Discharge: 2018-02-07 | Disposition: A | Discharge status: Home / Self Care | Payer: BLUE CROSS/BLUE SHIELD | Source: Ambulatory Visit | Attending: Radiation Oncology | Admitting: Radiation Oncology

## 2018-02-07 DIAGNOSIS — C50812 Malignant neoplasm of overlapping sites of left female breast: Secondary | ICD-10-CM | POA: Diagnosis not present

## 2018-02-08 ENCOUNTER — Ambulatory Visit
Admission: RE | Admit: 2018-02-08 | Discharge: 2018-02-08 | Disposition: A | Discharge status: Home / Self Care | Payer: BLUE CROSS/BLUE SHIELD | Source: Ambulatory Visit | Attending: Radiation Oncology | Admitting: Radiation Oncology

## 2018-02-08 DIAGNOSIS — C50812 Malignant neoplasm of overlapping sites of left female breast: Secondary | ICD-10-CM | POA: Diagnosis not present

## 2018-02-11 ENCOUNTER — Ambulatory Visit
Admission: RE | Admit: 2018-02-11 | Discharge: 2018-02-11 | Disposition: A | Discharge status: Home / Self Care | Payer: BLUE CROSS/BLUE SHIELD | Source: Ambulatory Visit | Attending: Radiation Oncology | Admitting: Radiation Oncology

## 2018-02-11 DIAGNOSIS — C50812 Malignant neoplasm of overlapping sites of left female breast: Secondary | ICD-10-CM | POA: Diagnosis not present

## 2018-02-12 ENCOUNTER — Encounter: Payer: Self-pay | Admitting: *Deleted

## 2018-02-12 ENCOUNTER — Ambulatory Visit
Admission: RE | Admit: 2018-02-12 | Discharge: 2018-02-12 | Disposition: A | Discharge status: Home / Self Care | Payer: BLUE CROSS/BLUE SHIELD | Source: Ambulatory Visit | Attending: Radiation Oncology | Admitting: Radiation Oncology

## 2018-02-12 ENCOUNTER — Other Ambulatory Visit: Payer: Self-pay | Admitting: Oncology

## 2018-02-12 DIAGNOSIS — C50812 Malignant neoplasm of overlapping sites of left female breast: Secondary | ICD-10-CM | POA: Diagnosis not present

## 2018-02-13 ENCOUNTER — Ambulatory Visit
Admission: RE | Admit: 2018-02-13 | Discharge: 2018-02-13 | Disposition: A | Discharge status: Home / Self Care | Payer: BLUE CROSS/BLUE SHIELD | Source: Ambulatory Visit | Attending: Radiation Oncology | Admitting: Radiation Oncology

## 2018-02-13 DIAGNOSIS — C50812 Malignant neoplasm of overlapping sites of left female breast: Secondary | ICD-10-CM | POA: Diagnosis not present

## 2018-02-14 ENCOUNTER — Ambulatory Visit
Admission: RE | Admit: 2018-02-14 | Discharge: 2018-02-14 | Disposition: A | Discharge status: Home / Self Care | Payer: BLUE CROSS/BLUE SHIELD | Source: Ambulatory Visit | Attending: Radiation Oncology | Admitting: Radiation Oncology

## 2018-02-14 DIAGNOSIS — C50812 Malignant neoplasm of overlapping sites of left female breast: Secondary | ICD-10-CM | POA: Diagnosis not present

## 2018-02-15 ENCOUNTER — Ambulatory Visit: Payer: BLUE CROSS/BLUE SHIELD

## 2018-02-18 ENCOUNTER — Ambulatory Visit: Payer: BLUE CROSS/BLUE SHIELD

## 2018-02-19 ENCOUNTER — Ambulatory Visit: Payer: BLUE CROSS/BLUE SHIELD

## 2018-02-20 ENCOUNTER — Ambulatory Visit: Payer: BLUE CROSS/BLUE SHIELD

## 2018-02-21 ENCOUNTER — Ambulatory Visit: Payer: BLUE CROSS/BLUE SHIELD

## 2018-02-22 ENCOUNTER — Inpatient Hospital Stay: Payer: BLUE CROSS/BLUE SHIELD

## 2018-02-22 ENCOUNTER — Encounter: Payer: Self-pay | Admitting: Nurse Practitioner

## 2018-02-22 ENCOUNTER — Ambulatory Visit: Payer: BLUE CROSS/BLUE SHIELD

## 2018-02-22 ENCOUNTER — Inpatient Hospital Stay (HOSPITAL_BASED_OUTPATIENT_CLINIC_OR_DEPARTMENT_OTHER): Payer: BLUE CROSS/BLUE SHIELD | Admitting: Nurse Practitioner

## 2018-02-22 VITALS — BP 132/82 | HR 92 | Temp 98.0°F | Wt 195.7 lb

## 2018-02-22 DIAGNOSIS — Z79811 Long term (current) use of aromatase inhibitors: Secondary | ICD-10-CM

## 2018-02-22 DIAGNOSIS — Z79899 Other long term (current) drug therapy: Secondary | ICD-10-CM

## 2018-02-22 DIAGNOSIS — Z7984 Long term (current) use of oral hypoglycemic drugs: Secondary | ICD-10-CM

## 2018-02-22 DIAGNOSIS — M8580 Other specified disorders of bone density and structure, unspecified site: Secondary | ICD-10-CM

## 2018-02-22 DIAGNOSIS — E119 Type 2 diabetes mellitus without complications: Secondary | ICD-10-CM

## 2018-02-22 DIAGNOSIS — I10 Essential (primary) hypertension: Secondary | ICD-10-CM

## 2018-02-22 DIAGNOSIS — Z8542 Personal history of malignant neoplasm of other parts of uterus: Secondary | ICD-10-CM

## 2018-02-22 DIAGNOSIS — Z9221 Personal history of antineoplastic chemotherapy: Secondary | ICD-10-CM

## 2018-02-22 DIAGNOSIS — R5383 Other fatigue: Secondary | ICD-10-CM | POA: Diagnosis not present

## 2018-02-22 DIAGNOSIS — R5381 Other malaise: Secondary | ICD-10-CM | POA: Diagnosis not present

## 2018-02-22 DIAGNOSIS — M1990 Unspecified osteoarthritis, unspecified site: Secondary | ICD-10-CM

## 2018-02-22 DIAGNOSIS — Z9012 Acquired absence of left breast and nipple: Secondary | ICD-10-CM | POA: Diagnosis not present

## 2018-02-22 DIAGNOSIS — C50412 Malignant neoplasm of upper-outer quadrant of left female breast: Secondary | ICD-10-CM | POA: Diagnosis not present

## 2018-02-22 DIAGNOSIS — Z95828 Presence of other vascular implants and grafts: Secondary | ICD-10-CM

## 2018-02-22 DIAGNOSIS — Z170 Estrogen receptor positive status [ER+]: Secondary | ICD-10-CM | POA: Diagnosis not present

## 2018-02-22 DIAGNOSIS — D649 Anemia, unspecified: Secondary | ICD-10-CM

## 2018-02-22 DIAGNOSIS — Z803 Family history of malignant neoplasm of breast: Secondary | ICD-10-CM

## 2018-02-22 DIAGNOSIS — Z9989 Dependence on other enabling machines and devices: Secondary | ICD-10-CM

## 2018-02-22 DIAGNOSIS — C50512 Malignant neoplasm of lower-outer quadrant of left female breast: Secondary | ICD-10-CM | POA: Diagnosis not present

## 2018-02-22 DIAGNOSIS — G4730 Sleep apnea, unspecified: Secondary | ICD-10-CM

## 2018-02-22 DIAGNOSIS — Z923 Personal history of irradiation: Secondary | ICD-10-CM | POA: Diagnosis not present

## 2018-02-22 DIAGNOSIS — Z90710 Acquired absence of both cervix and uterus: Secondary | ICD-10-CM

## 2018-02-22 DIAGNOSIS — C773 Secondary and unspecified malignant neoplasm of axilla and upper limb lymph nodes: Secondary | ICD-10-CM | POA: Diagnosis not present

## 2018-02-22 DIAGNOSIS — G629 Polyneuropathy, unspecified: Secondary | ICD-10-CM

## 2018-02-22 MED ORDER — HEPARIN SOD (PORK) LOCK FLUSH 100 UNIT/ML IV SOLN
500.0000 [IU] | Freq: Once | INTRAVENOUS | Status: AC
  Administered 2018-02-22: 500 [IU] via INTRAVENOUS

## 2018-02-22 MED ORDER — SODIUM CHLORIDE 0.9% FLUSH
10.0000 mL | Freq: Once | INTRAVENOUS | Status: AC
  Administered 2018-02-22: 10 mL via INTRAVENOUS
  Filled 2018-02-22: qty 10

## 2018-02-22 NOTE — Progress Notes (Signed)
[place] note [facility]  [phone] [phone]  Patient Care Team: Glendon Axe, MD as PCP - General (Internal Medicine) Rico Junker, RN as Oncology Nurse Navigator Earlie Server, MD as Consulting Physician (Oncology) Leonie Green, MD as Referring Physician (Surgery) Noreene Filbert, MD as Referring Physician (Radiation Oncology)   Name of the patient: Dominique Guzman  [id number]  April 12, 1951   Date of visit: 02/22/18  CLINIC:  Survivorship   REASON FOR VISIT:  Routine follow-up post-treatment for a recent history of breast cancer.  BRIEF ONCOLOGIC HISTORY:  Patient initially referred by Dr. Ouida Sills for evaluation and management of newly diagnosed breast cancer.  Patient had a history of left breast mass biopsy followed by breast excisional biopsy in 2015 with benign pathology.  She also has a history of uterine cancer which was treated with radiation therapy by Dr. Baruch Gouty  04/17/17- MM 3D SCREEN BREAST BILATERAL- Findings in left breast represent possible mass and warrant further evaluation.  04/25/17 MM DIAG BREAST TOMO UNI LEFT- Highly suspicious left breast masses at 4 o'clock, 7 cm from the nipple and 1 o'clock, 3 cm from the nipple. Enlarged left axillary lymph node suspicious for metastatic disease. 04/25/17- U/S guided biopsies of left breast mass at 4 o'clock, 7 cm from nipple, and 1 o'clock, 3 cm from nipple, and enlarged left axillary LN.  05/14/2017-MRI BREAST BILATER W WO CONTRAST- 1. Two sites of biopsy proven malignancy seen in the left breast, which span up to 11.3 cm. There are intervening areas of non mass enhancement and a small suspicious enhancing mass. 2. The biopsy-proven metastatic left axillary lymph node is identified. There are 3 other prominent left axillary lymph nodes which are not definitely pathologically enlarged. 3.  No evidence of right breast malignancy. 07/11/17- MR BREAST  BILATERAL W WO CONTRAST- 1. Partial imaging response of the known malignancy in the left breast, as described above, currently spanning 10.1 cm. 2. Complete imaging response of previously demonstrated left axillary adenopathy. 3. No evidence of malignancy on the right. 10/16/2017- MR BREAST BILATERAL W WO CONTRAST- 1. No significant change in residual areas of low-grade enhancement in the central and upper outer left breast, as described above. 2. No residual adenopathy. 3. No evidence of malignancy on the right. 10/26/2017- PATHOLOGY: A. SENTINEL LYMPH NODE #1, LEFT AXILLA; EXCISION: - METASTATIC MAMMARY CARCINOMA, ONE LYMPH NODE, 5 MM METASTASIS (1/1). B. SENTINEL LYMPH NODES #2 AND #3, LEFT AXILLA; EXCISION:  - NEGATIVE FOR MALIGNANCY, THREE LYMPH NODES (0/3). C. BREAST, LEFT; MODIFIED RADICAL MASTECTOMY: - INVASIVE MAMMARY CARCINOMA, TWO SITES, SEE SUMMARY BELOW.  - BIOPSY SITE CHANGES AT TWO SITES; ONE MARKER CLIP FOUND. - METASTATIC MAMMARY CARCINOMA IN TWO OF FOUR AXILLARY LYMPH NODES, WITH noLARGER METASTASIS (8 MM) LOCATED IN NODE WITH MARKER CLIP (2/4).   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  Clinical stage IIa.  Her cancer treatment included chemotherapy, surgery, and radiation.  She underwent mastectomy with sentinel lymph node biopsy and modified radical mastectomy on 10/26/2017.  8 lymph nodes were removed.  3 total positive lymph nodes. She received 4 cycles of neoadjuvant DD AC chemotherapy from 05/23/2017 to 07/04/2017 followed by 12 cycles of Taxol from 07/18/2017 to 10/03/2017.  EF of 60-65%.  She received radiotherapy to the left breast from 12/17/2017 to 02/14/2018 at dose of 50.4 Gy in 28 treatments, 12.46 Gy and 7 treatments and boost of 16  Gy in 8 treatments. To be discussed with Dr. Tasia Catchings she may consider participation in phase 3 clinical trial with ribociclib with endocrine therapy as adjuvant treatment she  would receive 5 to 10 years of antiestrogen therapy.    INTERVAL HISTORY:  Patient presents to the survivorship clinic today for initial meeting to review her survivorship care plan detailing her treatment course for breast cancer, as well as monitoring long-term side effects of that treatment, education regarding health maintenance, screening, and overall wellness and health promotion.  Overall, she reports feeling well since completing treatment.  She offers no specific complaints today.   REVIEW OF SYSTEMS:  Review of Systems  Constitutional: Negative.   HENT: Negative.   Eyes: Negative.   Respiratory: Negative.   Cardiovascular: Negative.   Gastrointestinal: Negative.   Genitourinary: Negative.   Musculoskeletal: Negative.   Skin: Negative.   Neurological: Negative.   Endo/Heme/Allergies: Negative.   Psychiatric/Behavioral: Negative.   Breast: Denies any new nodularity, masses, tenderness, nipple changes, or nipple discharge. She performs self breast exams.   A 14-point review of systems was completed and was negative, except as noted above.  ONCOLOGY TREATMENT TEAM:  1. Surgeon:  Dr. Leodis Binet 2. Medical Oncologist: Dr. Tasia Catchings  3. Radiation Oncologist: Dr. Baruch Gouty   PAST MEDICAL/SURGICAL HISTORY:  Past Medical History:  Diagnosis Date  . Anemia   . Arthritis    HIP-LEFT  . Benign neoplasm of colon   . Breast cancer ([place])   . Cancer ([place])   . Diabetes mellitus without complication ([place])   . Genetic testing 01/10/2018   Multi-Cancer panel (83 genes) @ [facility] - No pathogenic mutations detected  . H/O osteopenia   . History of hiatal hernia   . Hypertension   . Sleep apnea    USE CPAP  . Uterine cancer [facility])    Past Surgical History:  Procedure Laterality Date  . ABDOMINAL HYSTERECTOMY    . BREAST BIOPSY Left 04/2014   negative  . BREAST EXCISIONAL BIOPSY Left 2015  . CESAREAN SECTION     X2  . COLONOSCOPY WITH PROPOFOL N/A 04/15/2015   Procedure:  COLONOSCOPY WITH PROPOFOL;  Surgeon: Hulen Luster, MD;  Location: [facility] ENDOSCOPY;  Service: Gastroenterology;  Laterality: N/A;  . HERNIA REPAIR    . MASTECTOMY MODIFIED RADICAL Left 10/26/2017   Procedure: MASTECTOMY MODIFIED RADICAL;  Surgeon: Leonie Green, MD;  Location: [facility] ORS;  Service: General;  Laterality: Left;  [name] MASTECTOMY W/ SENTINEL NODE BIOPSY Left 10/26/2017   Procedure: MASTECTOMY WITH SENTINEL LYMPH NODE BIOPSY;  Surgeon: Leonie Green, MD;  Location: [facility] ORS;  Service: General;  Laterality: Left;  . PORTACATH PLACEMENT Right 05/22/2017   Procedure: INSERTION PORT-A-CATH;  Surgeon: Leonie Green, MD;  Location: [facility] ORS;  Service: General;  Laterality: Right;   ALLERGIES:  No Known Allergies  CURRENT MEDICATIONS:  Outpatient Encounter Medications as of 02/22/2018  Medication Sig Note  . Cinnamon 500 MG capsule Take 1,000 mg by mouth daily.    . cyanocobalamin 1000 MCG tablet Take 1,000 mcg by mouth daily.   . diphenhydrAMINE (BENADRYL) 25 MG tablet Take 25 mg by mouth every 6 (six) hours as needed for allergies.    [name] FIBER PO Take 2 each by mouth daily.  10/19/2017: Gummy  . fluticasone (FLONASE) 50 MCG/ACT nasal spray Place 2 sprays into both nostrils daily as needed for allergies.    [name] GLUCOSAMINE-CHONDROITIN PO Take 1 tablet by mouth daily.   [name] HYDROcodone-acetaminophen (NORCO/VICODIN)  5-325 MG tablet Take 1-2 tablets by mouth every 6 (six) hours as needed for moderate pain.   [name] lidocaine-prilocaine (EMLA) cream Apply 1 application topically as needed. Apply small amount to port site at least 1 hour prior to it being accessed, cover with plastic wrap   . lisinopril-hydrochlorothiazide (PRINZIDE,ZESTORETIC) 20-12.5 MG per tablet Take 1 tablet by mouth daily.   . metFORMIN (GLUCOPHAGE) 500 MG tablet Take 1,000 mg by mouth 2 (two) times daily.    . Multiple Vitamin (MULTIVITAMIN) tablet Take 1 tablet by mouth daily.   [name] pyridOXINE (VITAMIN B-6) 100 MG tablet  Take 1 tablet (100 mg total) by mouth daily.   . sitaGLIPtin (JANUVIA) 100 MG tablet Take 100 mg by mouth daily.   . Calcium Carb-Cholecalciferol (CALCIUM 600-D PO) Take 1 tablet by mouth daily.   . chlorhexidine (PERIDEX) 0.12 % solution USE AS DIRECTED 15 ML IN THE MOUTH OR THROAT TWICE A DAY (Patient not taking: Reported on 02/06/2018)   . loperamide (IMODIUM) 2 MG capsule Take 1 capsule (2 mg total) by mouth See admin instructions. With onset of loose stool, take '4mg'$  followed by '2mg'$  every 2 hours until 12 hours have passed without loose bowel movement. Maximum: 16 mg/day (Patient not taking: Reported on 02/22/2018)   . ondansetron (ZOFRAN) 8 MG tablet Take 1 tablet (8 mg total) by mouth every 8 (eight) hours as needed for nausea or vomiting. (Patient not taking: Reported on 02/22/2018)   . silver sulfADIAZINE (SILVADENE) 1 % cream Apply 1 application topically daily. (Patient not taking: Reported on 02/22/2018)   . triamcinolone cream (KENALOG) 0.1 % Apply 1 application topically daily as needed (for rash).   . valACYclovir (VALTREX) 1000 MG tablet Take 1 tablet (1,000 mg total) by mouth 2 (two) times daily. (Patient not taking: Reported on 02/06/2018)   . [EXPIRED] heparin lock flush 100 unit/mL    . [EXPIRED] sodium chloride flush (NS) 0.9 % injection 10 mL     No facility-administered encounter medications on file as of 02/22/2018.     ONCOLOGIC FAMILY HISTORY:  Family History  Problem Relation Age of Onset  . Diabetes Mother   . Diabetes Father   . Melanoma Father 17       on finger  . Diabetes Sister   . Pancreatic cancer Paternal Grandfather        deceased 82s  . Breast cancer Other 42       paternal grandmother's mother    GENETIC COUNSELING/TESTING: [facility] Multi-Cancer Panel of 83 genes negative  SOCIAL HISTORY:  Social History   Socioeconomic History  . Marital status: Married    Spouse name: Not on file  . Number of children: Not on file  . Years of education: Not on  file  . Highest education level: Not on file  Occupational History  . Not on file  Social Needs  . Financial resource strain: Not on file  . Food insecurity:    Worry: Not on file    Inability: Not on file  . Transportation needs:    Medical: Not on file    Non-medical: Not on file  Tobacco Use  . Smoking status: Never Smoker  . Smokeless tobacco: Never Used  Substance and Sexual Activity  . Alcohol use: No  . Drug use: No  . Sexual activity: Not on file  Lifestyle  . Physical activity:    Days per week: Not on file    Minutes per session: Not on file  .  Stress: Not on file  Relationships  . Social connections:    Talks on phone: Not on file    Gets together: Not on file    Attends religious service: Not on file    Active member of club or organization: Not on file    Attends meetings of clubs or organizations: Not on file    Relationship status: Not on file  Other Topics Concern  . Not on file  Social History Narrative  . Not on file   PHYSICAL EXAMINATION:   Vitals:   02/22/18 1349  BP: 132/82  Pulse: 92  Temp: 98 F (36.7 C)   Filed Weights   02/22/18 1349  Weight: 195 lb 11.2 oz (88.8 kg)   General: Well-nourished, well-appearing female in no acute distress. unaccompanied HEENT: Head is normocephalic. Conjunctivae clear without exudate.  Sclerae anicteric. Oral mucosa is pink, moist.   Cardiovascular: Regular rate and rhythm. Respiratory: Chest expansion symmetric; breathing non-labored.  GI: Abdomen soft and round; non-tender, non-distended. Bowel sounds normoactive.  Neuro: No focal deficits. Steady gait.  Psych: Mood and affect normal and appropriate for situation.  Extremities: No edema. Skin: Warm and dry Breast: deferred  LABORATORY DATA:  None for this visit.  DIAGNOSTIC IMAGING:  None for this visit.   ASSESSMENT AND PLAN:  Ms.. Laham is a pleasant 67 y.o. female with Stage IIa left breast invasive ductal carcinoma, ER+/PR+/HER2-,  diagnosed 05/04/2017, treated with chemotherapy, surgery, radiation, and anti-estrogen therapy. She presents to the Survivorship Clinic for our initial meeting and routine follow-up post-completion of treatment for breast cancer.   1. Stage IIa left breast cancer:  Ms. Clover is continuing to recover from definitive treatment for breast cancer. She will follow-up with her medical oncologist, Tasia Catchings with history and physical exam per surveillance protocol.  She will continue her anti-estrogen therapy as discussed with Dr. Tasia Catchings based on possible clinical trial. Though the incidence is low, there is an associated risk of endometrial cancer with anti-estrogen therapies.  Ms. Guilbert was encouraged to contact Dr. Tasia Catchings or myself with any vaginal bleeding while taking anti-estrogen therapy. Today, a comprehensive survivorship care plan and treatment summary was reviewed with the patient today detailing her breast cancer diagnosis, treatment course, potential late/long-term effects of treatment, appropriate follow-up care with recommendations for the future, and patient education resources.  A copy of this summary, along with a letter will be sent to the patient's primary care provider via mail/fax/In Basket message after today's visit.    #. Port-a-Cath: Patient had port placed for administration of chemotherapy. It remains in place at this time. Discussed the need to flush the port every 6 to 8 weeks. We also discussed that her medical oncologist will likely consider the removal of the port when she is 1 year post treatment.  Patient verbalizes understanding and agrees to comply with port flush schedule.  #. Bone health:  Given Ms. Savidge's age/history of breast cancer and her current treatment regimen including anti-estrogen therapy as determined by Dr. Tasia Catchings, she is at risk for bone demineralization.  She has not had a bone density scan and would need baseline scan. In the meantime, she was encouraged to increase  her consumption of foods rich in calcium, as well as increase her weight-bearing activities.  She was given education on specific activities to promote bone health.  #. Cancer screening:  Due to Ms. Taddeo's history and her age, she should receive screening for skin cancers, colon cancer, and gynecologic cancers.  The  information and recommendations are listed on the patient's comprehensive care plan/treatment summary and were reviewed in detail with the patient.    #. Health maintenance and wellness promotion: Ms. Gangwer was encouraged to consume 5-7 servings of fruits and vegetables per day. We reviewed the "Nutrition Rainbow" handout, as well as the handout "Take Control of Your Health and Reduce Your Cancer Risk" from the [place].  She was also encouraged to engage in moderate to vigorous exercise for 30 minutes per day most days of the week. We discussed the [employer] fitness program, which is designed for cancer survivors to help them become more physically fit after cancer treatments as well as the CARE program.  She was instructed to limit her alcohol consumption and continue to abstain from tobacco use   #. Support services/counseling: It is not uncommon for this period of the patient's cancer care trajectory to be one of many emotions and stressors.  We discussed an opportunity for her to participate in the next session of [facility] ("Finding Your New Normal") support group series designed for patients after they have completed treatment.   Ms. Decock was encouraged to take advantage of our many other support services programs, support groups, and/or counseling in coping with her new life as a cancer survivor after completing anti-cancer treatment.  She was offered support today through active listening and expressive supportive counseling.  She was given information regarding our available services and encouraged to contact me with any questions or for help enrolling in  any of our support group/programs.    Dispo:   -Return to cancer center for port flushes every 6 to 8 weeks as scheduled -Return to cancer center for follow-up with Dr. Tasia Catchings on 03/12/2018 and Dr. Baruch Gouty on 03/20/2018. -She is welcome to return back to the Survivorship Clinic at any time; no additional follow-up needed at this time.  -Consider referral back to survivorship as a long-term survivor for continued surveillance  A total of (15) minutes of face-to-face time was spent with this patient with greater than 50% of that time in counseling and care-coordination.   Beckey Rutter, DNP, AGNP-C [facility] (work cell) [phone] (office) 02/22/18 4:29 PM     Note: [place], [place], [place] [phone]

## 2018-02-22 NOTE — Progress Notes (Signed)
Survivorship Care Plan visit completed.  Treatment summary reviewed and given to patient.  ASCO answers booklet reviewed and given to patient.  CARE program and Cancer Transitions discussed with patient along with other resources cancer center offers to patients and caregivers.  Patient verbalized understanding.    

## 2018-02-25 ENCOUNTER — Ambulatory Visit: Payer: BLUE CROSS/BLUE SHIELD

## 2018-02-26 ENCOUNTER — Ambulatory Visit: Payer: BLUE CROSS/BLUE SHIELD

## 2018-02-27 ENCOUNTER — Ambulatory Visit: Payer: BLUE CROSS/BLUE SHIELD

## 2018-02-28 ENCOUNTER — Ambulatory Visit: Payer: BLUE CROSS/BLUE SHIELD

## 2018-02-28 ENCOUNTER — Telehealth: Payer: Self-pay | Admitting: Oncology

## 2018-02-28 ENCOUNTER — Other Ambulatory Visit: Payer: Self-pay | Admitting: Oncology

## 2018-02-28 DIAGNOSIS — Z79811 Long term (current) use of aromatase inhibitors: Secondary | ICD-10-CM

## 2018-02-28 MED ORDER — LETROZOLE 2.5 MG PO TABS: 2.5000 mg | ORAL_TABLET | Freq: Every day | ORAL | 3 refills | Status: DC

## 2018-02-28 NOTE — Telephone Encounter (Signed)
Called patient. She has been contacted by clinical trial RN. Has paper work and has not decided yet.  She understands that she needs to be started on aromatase inhibitor regardless if she goes on trial or not.  Rational and side effects including bone loss, fracture, hot flush, joint pain, fatigue, etc  were discussed previously with her and discussed with her again. She agrees. Will send Letrozole to Rx.  Obtain bone density and will discuss results when she comes back to follow up with me in June.

## 2018-03-01 ENCOUNTER — Ambulatory Visit: Payer: BLUE CROSS/BLUE SHIELD

## 2018-03-05 ENCOUNTER — Ambulatory Visit: Payer: BLUE CROSS/BLUE SHIELD

## 2018-03-06 ENCOUNTER — Ambulatory Visit: Payer: BLUE CROSS/BLUE SHIELD

## 2018-03-06 ENCOUNTER — Other Ambulatory Visit: Payer: BLUE CROSS/BLUE SHIELD

## 2018-03-06 ENCOUNTER — Ambulatory Visit: Payer: BLUE CROSS/BLUE SHIELD | Admitting: Oncology

## 2018-03-07 ENCOUNTER — Ambulatory Visit: Payer: BLUE CROSS/BLUE SHIELD

## 2018-03-08 ENCOUNTER — Ambulatory Visit: Payer: BLUE CROSS/BLUE SHIELD

## 2018-03-11 ENCOUNTER — Other Ambulatory Visit: Payer: Self-pay | Admitting: Oncology

## 2018-03-11 ENCOUNTER — Ambulatory Visit: Payer: BLUE CROSS/BLUE SHIELD

## 2018-03-11 DIAGNOSIS — T451X5A Adverse effect of antineoplastic and immunosuppressive drugs, initial encounter: Principal | ICD-10-CM

## 2018-03-11 DIAGNOSIS — D649 Anemia, unspecified: Secondary | ICD-10-CM

## 2018-03-11 DIAGNOSIS — D6481 Anemia due to antineoplastic chemotherapy: Secondary | ICD-10-CM

## 2018-03-12 ENCOUNTER — Other Ambulatory Visit: Payer: Self-pay

## 2018-03-12 ENCOUNTER — Inpatient Hospital Stay: Payer: BLUE CROSS/BLUE SHIELD | Attending: Oncology

## 2018-03-12 ENCOUNTER — Encounter: Payer: Self-pay | Admitting: Oncology

## 2018-03-12 ENCOUNTER — Inpatient Hospital Stay: Payer: BLUE CROSS/BLUE SHIELD

## 2018-03-12 ENCOUNTER — Ambulatory Visit: Payer: BLUE CROSS/BLUE SHIELD

## 2018-03-12 ENCOUNTER — Encounter: Payer: Self-pay | Admitting: *Deleted

## 2018-03-12 ENCOUNTER — Inpatient Hospital Stay (HOSPITAL_BASED_OUTPATIENT_CLINIC_OR_DEPARTMENT_OTHER): Payer: BLUE CROSS/BLUE SHIELD | Admitting: Oncology

## 2018-03-12 VITALS — BP 131/80 | HR 87 | Temp 98.5°F | Ht <= 58 in | Wt 196.2 lb

## 2018-03-12 DIAGNOSIS — R5383 Other fatigue: Secondary | ICD-10-CM | POA: Insufficient documentation

## 2018-03-12 DIAGNOSIS — Z79811 Long term (current) use of aromatase inhibitors: Secondary | ICD-10-CM | POA: Diagnosis not present

## 2018-03-12 DIAGNOSIS — G4730 Sleep apnea, unspecified: Secondary | ICD-10-CM | POA: Diagnosis not present

## 2018-03-12 DIAGNOSIS — Z7984 Long term (current) use of oral hypoglycemic drugs: Secondary | ICD-10-CM | POA: Diagnosis not present

## 2018-03-12 DIAGNOSIS — R5381 Other malaise: Secondary | ICD-10-CM | POA: Diagnosis not present

## 2018-03-12 DIAGNOSIS — C773 Secondary and unspecified malignant neoplasm of axilla and upper limb lymph nodes: Secondary | ICD-10-CM

## 2018-03-12 DIAGNOSIS — T451X5A Adverse effect of antineoplastic and immunosuppressive drugs, initial encounter: Secondary | ICD-10-CM

## 2018-03-12 DIAGNOSIS — G629 Polyneuropathy, unspecified: Secondary | ICD-10-CM | POA: Insufficient documentation

## 2018-03-12 DIAGNOSIS — Z803 Family history of malignant neoplasm of breast: Secondary | ICD-10-CM | POA: Insufficient documentation

## 2018-03-12 DIAGNOSIS — M8580 Other specified disorders of bone density and structure, unspecified site: Secondary | ICD-10-CM

## 2018-03-12 DIAGNOSIS — Z9012 Acquired absence of left breast and nipple: Secondary | ICD-10-CM | POA: Diagnosis not present

## 2018-03-12 DIAGNOSIS — Z90710 Acquired absence of both cervix and uterus: Secondary | ICD-10-CM | POA: Insufficient documentation

## 2018-03-12 DIAGNOSIS — Z170 Estrogen receptor positive status [ER+]: Secondary | ICD-10-CM | POA: Insufficient documentation

## 2018-03-12 DIAGNOSIS — Z8542 Personal history of malignant neoplasm of other parts of uterus: Secondary | ICD-10-CM | POA: Insufficient documentation

## 2018-03-12 DIAGNOSIS — Z5111 Encounter for antineoplastic chemotherapy: Secondary | ICD-10-CM

## 2018-03-12 DIAGNOSIS — Z923 Personal history of irradiation: Secondary | ICD-10-CM | POA: Insufficient documentation

## 2018-03-12 DIAGNOSIS — I10 Essential (primary) hypertension: Secondary | ICD-10-CM | POA: Diagnosis not present

## 2018-03-12 DIAGNOSIS — Z79899 Other long term (current) drug therapy: Secondary | ICD-10-CM

## 2018-03-12 DIAGNOSIS — C50512 Malignant neoplasm of lower-outer quadrant of left female breast: Secondary | ICD-10-CM | POA: Insufficient documentation

## 2018-03-12 DIAGNOSIS — C50412 Malignant neoplasm of upper-outer quadrant of left female breast: Secondary | ICD-10-CM | POA: Insufficient documentation

## 2018-03-12 DIAGNOSIS — Z9221 Personal history of antineoplastic chemotherapy: Secondary | ICD-10-CM | POA: Diagnosis not present

## 2018-03-12 DIAGNOSIS — D649 Anemia, unspecified: Secondary | ICD-10-CM

## 2018-03-12 DIAGNOSIS — E860 Dehydration: Secondary | ICD-10-CM

## 2018-03-12 DIAGNOSIS — D6481 Anemia due to antineoplastic chemotherapy: Secondary | ICD-10-CM

## 2018-03-12 DIAGNOSIS — C50912 Malignant neoplasm of unspecified site of left female breast: Secondary | ICD-10-CM

## 2018-03-12 DIAGNOSIS — E119 Type 2 diabetes mellitus without complications: Secondary | ICD-10-CM | POA: Diagnosis not present

## 2018-03-12 DIAGNOSIS — Z95828 Presence of other vascular implants and grafts: Secondary | ICD-10-CM

## 2018-03-12 LAB — CBC WITH DIFFERENTIAL/PLATELET
BASOS ABS: 0 10*3/uL (ref 0–0.1)
BASOS PCT: 1 %
EOS ABS: 0.4 10*3/uL (ref 0–0.7)
Eosinophils Relative: 5 %
HCT: 35.1 % (ref 35.0–47.0)
Hemoglobin: 12.3 g/dL (ref 12.0–16.0)
Lymphocytes Relative: 10 %
Lymphs Abs: 0.8 10*3/uL — ABNORMAL LOW (ref 1.0–3.6)
MCH: 28.8 pg (ref 26.0–34.0)
MCHC: 35.2 g/dL (ref 32.0–36.0)
MCV: 81.8 fL (ref 80.0–100.0)
MONO ABS: 0.5 10*3/uL (ref 0.2–0.9)
MONOS PCT: 7 %
Neutro Abs: 5.9 10*3/uL (ref 1.4–6.5)
Neutrophils Relative %: 77 %
PLATELETS: 290 10*3/uL (ref 150–440)
RBC: 4.29 MIL/uL (ref 3.80–5.20)
RDW: 16.3 % — AB (ref 11.5–14.5)
WBC: 7.6 10*3/uL (ref 3.6–11.0)

## 2018-03-12 LAB — COMPREHENSIVE METABOLIC PANEL
ALK PHOS: 80 U/L (ref 38–126)
ALT: 24 U/L (ref 14–54)
AST: 30 U/L (ref 15–41)
Albumin: 3.9 g/dL (ref 3.5–5.0)
Anion gap: 9 (ref 5–15)
BUN: 16 mg/dL (ref 6–20)
CALCIUM: 9.8 mg/dL (ref 8.9–10.3)
CHLORIDE: 103 mmol/L (ref 101–111)
CO2: 25 mmol/L (ref 22–32)
CREATININE: 0.73 mg/dL (ref 0.44–1.00)
GFR calc non Af Amer: >60 mL/min (ref >60)
GLUCOSE: 270 mg/dL — AB (ref 65–99)
Potassium: 4 mmol/L (ref 3.5–5.1)
SODIUM: 137 mmol/L (ref 135–145)
Total Bilirubin: 0.4 mg/dL (ref 0.3–1.2)
Total Protein: 6.8 g/dL (ref 6.5–8.1)

## 2018-03-12 LAB — IRON AND TIBC
IRON: 55 ug/dL (ref 28–170)
Saturation Ratios: 16 % (ref 10.4–31.8)
TIBC: 346 ug/dL (ref 250–450)
UIBC: 291 ug/dL

## 2018-03-12 MED ORDER — ZOLEDRONIC ACID 4 MG/100ML IV SOLN
4.0000 mg | Freq: Once | INTRAVENOUS | Status: AC
Start: 2018-03-12 — End: 2018-03-12
  Administered 2018-03-12: 4 mg via INTRAVENOUS
  Filled 2018-03-12: qty 100

## 2018-03-12 MED ORDER — SODIUM CHLORIDE 0.9% FLUSH
10.0000 mL | INTRAVENOUS | Status: DC | PRN
  Administered 2018-03-12: 10 mL via INTRAVENOUS
  Filled 2018-03-12: qty 10

## 2018-03-12 MED ORDER — HEPARIN SOD (PORK) LOCK FLUSH 100 UNIT/ML IV SOLN
500.0000 [IU] | Freq: Once | INTRAVENOUS | Status: AC
  Administered 2018-03-12: 500 [IU] via INTRAVENOUS
  Filled 2018-03-12: qty 5

## 2018-03-12 MED ORDER — SODIUM CHLORIDE 0.9 % IV SOLN
INTRAVENOUS | Status: DC
  Administered 2018-03-12: 10:00:00 via INTRAVENOUS
  Filled 2018-03-12: qty 1000

## 2018-03-12 NOTE — Progress Notes (Signed)
Patient here today for follow up and zometa

## 2018-03-12 NOTE — Progress Notes (Signed)
[place] [facility] Follow Up Visit.  Patient Care Team: Glendon Axe, MD as PCP - General (Internal Medicine) Rico Junker, RN as Oncology Nurse Navigator Earlie Server, MD as Consulting Physician (Oncology) Leonie Green, MD as Referring Physician (Surgery) Noreene Filbert, MD as Referring Physician (Radiation Oncology)  Reason for visit Follow up for treatment of breast cancer.   Pertinent Oncology History Dominique Guzman 67 y.o. female with PMH listed as below is referred by Dr.Schermerhorn to [name] for evaluation and management of newly diagnosed breast cancer. Patient had a history a left breast mass biopsy followed by breast excisonal biopsy in 2015 with benign pathology.  She also has a history of Uterine cancer which was treated with RT with Dr.Chrystal.  She underwent routine screening mammogram on 04/17/2017 which recommended a possible mass on the left breast. Left diagnostic mammogram showed a highly suspicious left breast mass at 4 o'clock, 7 cm from the nipple and 1 o'clock,,  About 3 cm from the nipple. Enlarged left axillary lymph node suspicious for metastatic disease.  1 US breast confirmed a left breast mass of 2.1cm, 4 o'clock, and 1.1 cm mass at 1 o'clock.The masses at 1 and 4 o'clock are separated by approximately 4 cm sonographically 2 Biopsy of the two mass and axillary lymph node revealed invasive carcinoma, grade 2. Both are ER,PR positive. HER2 equivocal, FISH pending. 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  3 05/14/2017 MRI breast showed Two sites of biopsy proven malignancy seen in the left breast, which span up to 11.3 cm. There are intervening areas of non mass enhancement and a small suspicious enhancing mass. 4 Case was discussed on breast tumor board on 05/21/2017 and the panel agree with neoadjuvant ddAC to T followed by surgery. Clinically cT3N1M0  #Cancer treatment  patient completed s/p 4 cycles ddAC and weekly taxol x 12,  on 10/26/2017 patient underwent that surgery.  Left axillary sentinel lymph node was positive, so Dr. Tamala Julian proceeded with left mastectomy and left axillary lymph node dissection. Pathology showed ypT2(m) pN1a, ER/PR positive, HER2 FISH negative, grade 2, negative margin, invasive mammary carcinoma.   # Genetic testing Testing did not reveal a pathogenic mutation in any of the genes analyzed. A copy of the genetic test report will be scanned into Epic under the Media tab.The genes analyzed were the 83 genes on [facility]'s Multi-Cancer panel (ALK, APC, ATM, AXIN2, BAP1, BARD1, BLM, BMPR1A, BRCA1, BRCA2, BRIP1, CASR, CDC73, CDH1, CDK4, CDKN1B, CDKN1C, CDKN2A, CEBPA, CHEK2, CTNNA1, DICER1, DIS3L2, EGFR, EPCAM, FH, FLCN, GATA2, GPC3, GREM1, HOXB13, HRAS, KIT, MAX, MEN1, MET, MITF, MLH1, MSH2, MSH3, MSH6, MUTYH, NBN, NF1, NF2, NTHL1, PALB2, PDGFRA, PHOX2B, PMS2, POLD1, POLE, POT1, PRKAR1A, PTCH1, PTEN, RAD50, RAD51C, RAD51D, RB1, RECQL4, RET, RUNX1, SDHA, SDHAF2, SDHB, SDHC, SDHD, SMAD4, SMARCA4, SMARCB1, SMARCE1, STK11, SUFU, TERC, TERT, TMEM127, TP53, TSC1, TSC2, VHL, WRN, WT1).    INTERVAL HISTORY Patient presents for follow up for management of breast cancer ypT2 yp N1. She finished RT.  She has started on Letrozole 2 weeks as instructed by me. Today she reports feeling well. No hot flush, joint pain.  Chronic fatigue at baseline. 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 Negative for chills, fever and weight loss.  HENT: Negative for congestion, ear discharge, ear pain, nosebleeds, sinus pain and sore throat.   Eyes: Negative for double vision, photophobia, pain, discharge and redness.  Respiratory: Negative  for cough, hemoptysis, sputum production, shortness of breath and wheezing.   Cardiovascular: Negative for chest pain, palpitations, orthopnea, claudication and leg swelling.  Gastrointestinal: Negative for  abdominal pain, blood in stool, constipation, diarrhea, heartburn, melena, nausea and vomiting.  Genitourinary: Negative for dysuria, flank pain, frequency and hematuria.  Musculoskeletal: Negative for back pain, myalgias and neck pain.  Skin: Negative for itching and rash.  Neurological: Negative for dizziness, tingling, tremors, focal weakness, weakness and headaches.  Endo/Heme/Allergies: Negative for environmental allergies. Does not bruise/bleed easily.  Psychiatric/Behavioral: Negative for depression, hallucinations, substance abuse and suicidal ideas. The patient is not nervous/anxious.     MEDICAL HISTORY: Past Medical History:  Diagnosis Date  . Anemia   . Arthritis    HIP-LEFT  . Benign neoplasm of colon   . Breast cancer ([place])   . Cancer ([place])   . Diabetes mellitus without complication ([place])   . Genetic testing 01/10/2018   Multi-Cancer panel (83 genes) @ [facility] - No pathogenic mutations detected  . H/O osteopenia   . History of hiatal hernia   . Hypertension   . Sleep apnea    USE CPAP  . Uterine cancer [facility])     SURGICAL HISTORY: Past Surgical History:  Procedure Laterality Date  . ABDOMINAL HYSTERECTOMY    . BREAST BIOPSY Left 04/2014   negative  . BREAST EXCISIONAL BIOPSY Left 2015  . CESAREAN SECTION     X2  . COLONOSCOPY WITH PROPOFOL N/A 04/15/2015   Procedure: COLONOSCOPY WITH PROPOFOL;  Surgeon: Hulen Luster, MD;  Location: [facility] ENDOSCOPY;  Service: Gastroenterology;  Laterality: N/A;  . HERNIA REPAIR    . MASTECTOMY MODIFIED RADICAL Left 10/26/2017   Procedure: MASTECTOMY MODIFIED RADICAL;  Surgeon: Leonie Green, MD;  Location: [facility] ORS;  Service: General;  Laterality: Left;  [name] MASTECTOMY W/ SENTINEL NODE BIOPSY Left 10/26/2017   Procedure: MASTECTOMY WITH SENTINEL LYMPH NODE BIOPSY;  Surgeon: Leonie Green, MD;  Location: [facility] ORS;  Service: General;  Laterality: Left;  . PORTACATH PLACEMENT Right 05/22/2017   Procedure: INSERTION  PORT-A-CATH;  Surgeon: Leonie Green, MD;  Location: [facility] ORS;  Service: General;  Laterality: Right;    SOCIAL HISTORY: Social History   Socioeconomic History  . Marital status: Married    Spouse name: Not on file  . Number of children: Not on file  . Years of education: Not on file  . Highest education level: Not on file  Occupational History  . Not on file  Social Needs  . Financial resource strain: Not on file  . Food insecurity:    Worry: Not on file    Inability: Not on file  . Transportation needs:    Medical: Not on file    Non-medical: Not on file  Tobacco Use  . Smoking status: Never Smoker  . Smokeless tobacco: Never Used  Substance and Sexual Activity  . Alcohol use: No  . Drug use: No  . Sexual activity: Not on file  Lifestyle  . Physical activity:    Days per week: Not on file    Minutes per session: Not on file  . Stress: Not on file  Relationships  . Social connections:    Talks on phone: Not on file    Gets together: Not on file    Attends religious service: Not on file    Active member of club or organization: Not on file    Attends meetings of clubs or organizations: Not on file  Relationship status: Not on file  . Intimate partner violence:    Fear of current or ex partner: Not on file    Emotionally abused: Not on file    Physically abused: Not on file    Forced sexual activity: Not on file  Other Topics Concern  . Not on file  Social History Narrative  . Not on file    FAMILY HISTORY Family History  Problem Relation Age of Onset  . Diabetes Mother   . Diabetes Father   . Melanoma Father 69       on finger  . Diabetes Sister   . Pancreatic cancer Paternal Grandfather        deceased 55s  . Breast cancer Other 97       paternal grandmother's mother    ALLERGIES:  has No Known Allergies.  MEDICATIONS:  Current Outpatient Medications  Medication Sig Dispense Refill  . Calcium Carb-Cholecalciferol (CALCIUM 600-D PO)  Take 1 tablet by mouth daily.    . chlorhexidine (PERIDEX) 0.12 % solution USE AS DIRECTED 15 ML IN THE MOUTH OR THROAT TWICE A DAY 473 mL 0  . Cinnamon 500 MG capsule Take 1,000 mg by mouth daily.     . cyanocobalamin 1000 MCG tablet Take 1,000 mcg by mouth daily.    . diphenhydrAMINE (BENADRYL) 25 MG tablet Take 25 mg by mouth every 6 (six) hours as needed for allergies.     [name] FIBER PO Take 2 each by mouth daily.     . fluticasone (FLONASE) 50 MCG/ACT nasal spray Place 2 sprays into both nostrils daily as needed for allergies.     [name] GLUCOSAMINE-CHONDROITIN PO Take 1 tablet by mouth daily.    [name] HYDROcodone-acetaminophen (NORCO/VICODIN) 5-325 MG tablet Take 1-2 tablets by mouth every 6 (six) hours as needed for moderate pain. 16 tablet 0  . letrozole (FEMARA) 2.5 MG tablet Take 1 tablet (2.5 mg total) by mouth daily. 30 tablet 3  . lidocaine-prilocaine (EMLA) cream Apply 1 application topically as needed. Apply small amount to port site at least 1 hour prior to it being accessed, cover with plastic wrap 30 g 3  . lisinopril-hydrochlorothiazide (PRINZIDE,ZESTORETIC) 20-12.5 MG per tablet Take 1 tablet by mouth daily.    [name] loperamide (IMODIUM) 2 MG capsule Take 1 capsule (2 mg total) by mouth See admin instructions. With onset of loose stool, take 21m followed by 224mevery 2 hours until 12 hours have passed without loose bowel movement. Maximum: 16 mg/day 120 capsule 1  . metFORMIN (GLUCOPHAGE) 500 MG tablet Take 1,000 mg by mouth 2 (two) times daily.     . Multiple Vitamin (MULTIVITAMIN) tablet Take 1 tablet by mouth daily.    . ondansetron (ZOFRAN) 8 MG tablet Take 1 tablet (8 mg total) by mouth every 8 (eight) hours as needed for nausea or vomiting. 120 tablet 0  . pioglitazone (ACTOS) 15 MG tablet Take 15 mg by mouth daily.  1  . pyridOXINE (VITAMIN B-6) 100 MG tablet Take 1 tablet (100 mg total) by mouth daily. 90 tablet 3  . silver sulfADIAZINE (SILVADENE) 1 % cream Apply 1 application  topically daily. 50 g 0  . sitaGLIPtin (JANUVIA) 100 MG tablet Take 100 mg by mouth daily.    . [name] Kitchenriamcinolone cream (KENALOG) 0.1 % Apply 1 application topically daily as needed (for rash).    . valACYclovir (VALTREX) 1000 MG tablet Take 1 tablet (1,000 mg total) by mouth 2 (two) times daily. 14 tablet 0  No current facility-administered medications for this visit.     PHYSICAL EXAMINATION:  ECOG PERFORMANCE STATUS: 1 - Symptomatic but completely ambulatory  Vitals:   03/12/18 1609  BP: 131/80  Pulse: 87  Temp: 98.5 F (36.9 C)    Filed Weights   03/12/18 1609  Weight: 196 lb 3 oz (89 kg)     Physical Exam  Constitutional: She is oriented to person, place, and time and well-developed, well-nourished, and in no distress. No distress.  HENT:  Head: Normocephalic and atraumatic.  Right Ear: External ear normal.  Left Ear: External ear normal.  Nose: Nose normal.  Mouth/Throat: Oropharynx is clear and moist. No oropharyngeal exudate.  Eyes: Pupils are equal, round, and reactive to light. Conjunctivae and EOM are normal. Right eye exhibits no discharge. Left eye exhibits no discharge. No scleral icterus.  Neck: Normal range of motion. Neck supple. No JVD present.  Cardiovascular: Normal rate, regular rhythm and normal heart sounds.  No murmur heard. Pulmonary/Chest: Effort normal and breath sounds normal. No respiratory distress. She has no wheezes. She has no rales. She exhibits no tenderness.  Abdominal: Soft. Bowel sounds are normal. She exhibits no distension and no mass. There is no tenderness. There is no rebound and no guarding.  Musculoskeletal: Normal range of motion. She exhibits no edema or tenderness.  Lymphadenopathy:    She has no cervical adenopathy.  Neurological: She is alert and oriented to person, place, and time. No cranial nerve deficit. She exhibits normal muscle tone. Coordination normal.  Skin: Skin is warm and dry. No rash noted. She is not  diaphoretic. No erythema.  Psychiatric: Affect and judgment normal.  Breast exam is performed in seated and lying down position. Patient is status post left mastectomy. There is no evidence of any chest wall recurrence. No evidence of bilateral axillary adenopathy. No palpable right breast mass.  ECOG 0  LABORATORY DATA: CBC Latest Ref Rng & Units 03/12/2018 01/30/2018 01/16/2018  WBC 3.6 - 11.0 K/uL 7.6 8.7 9.7  Hemoglobin 12.0 - 16.0 g/dL 12.3 11.9(L) 12.1  Hematocrit 35.0 - 47.0 % 35.1 34.8(L) 35.2  Platelets 150 - 440 K/uL 290 283 297   CMP Latest Ref Rng & Units 03/12/2018 12/31/2017 12/24/2017  Glucose 65 - 99 mg/dL 270(H) 168(H) 147(H)  BUN 6 - 20 mg/dL '16 15 13  ' Creatinine 0.44 - 1.00 mg/dL 0.73 0.67 0.64  Sodium 135 - 145 mmol/L 137 132(L) 138  Potassium 3.5 - 5.1 mmol/L 4.0 3.8 4.1  Chloride 101 - 111 mmol/L 103 98(L) 105  CO2 22 - 32 mmol/L 25 21(L) 24  Calcium 8.9 - 10.3 mg/dL 9.8 9.9 9.9  Total Protein 6.5 - 8.1 g/dL 6.8 7.3 7.1  Total Bilirubin 0.3 - 1.2 mg/dL 0.4 0.5 0.5  Alkaline Phos 38 - 126 U/L 80 93 87  AST 15 - 41 U/L '30 25 21  ' ALT 14 - 54 U/L '24 19 17   ' Normal  B12 and Folate. Iron panel was checked in September showed no signs of iron deficiency  PATHOLOGY 10/27/2017  Surgical Pathology  CASE: [id number]  PATIENT: Dominique Guzman  Surgical Pathology Report  SPECIMEN SUBMITTED:  A. Sentinel lymph node #1, left axilla  B. Sentinel lymph nodes #2 and 3, left axilla  C. Breast, left; stitch outer end of skin ellipse, and axillary lymph  nodes  DIAGNOSIS:  A. SENTINEL LYMPH NODE #1, LEFT AXILLA; EXCISION:  - METASTATIC MAMMARY CARCINOMA, ONE LYMPH NODE, 5 MM METASTASIS (1/1).  B. SENTINEL LYMPH NODES #2 AND #3, LEFT AXILLA; EXCISION:  - NEGATIVE FOR MALIGNANCY, THREE LYMPH NODES (0/3).   C. BREAST, LEFT; MODIFIED RADICAL MASTECTOMY:  - INVASIVE MAMMARY CARCINOMA, TWO SITES, SEE SUMMARY BELOW.  - BIOPSY SITE CHANGES AT TWO SITES; ONE MARKER CLIP FOUND.   - METASTATIC MAMMARY CARCINOMA IN TWO OF FOUR AXILLARY LYMPH NODES, WITH  LARGER METASTASIS (8 MM) LOCATED IN NODE WITH MARKER CLIP (2/4).   Comment:  ER, PR, and HER2 were assessed on the previous core biopsies  ([id number], 04/25/2017) with results summarized as follows:  ER IHC (SP1): Both tumors positive, >90%, strong staining  PR IHC (IE2): Both tumors positive, 1:00 tumor 51-90%, mod to strong,  and 4:00 tumor >90% with strong staining  HER2 IHC (HercepTest): Both tumors equivocal, score 2+  HER2 FISH (DAKO IQFISH PharmDX): Both tumors negative (not amplified)   Surgical Pathology Cancer Case Summary  INVASIVE CARCINOMA OF THE BREAST:  Procedure: Total mastectomy  Specimen Laterality: Left  Histologic Type: Invasive carcinoma of no special type  Histologic Grade ([place] Histologic Score)       Glandular (Acinar)/Tubular Differentiation: Score 3       Nuclear Pleomorphism: Score 2       Mitotic Rate: Score 1       Overall Grade: Grade 2  Tumor Size: 23 mm (4-5:00 posterior tumor); 12 mm (1:00 tumor)  Ductal Carcinoma In Situ (DCIS): Present  DCIS Nuclear Grade: Intermediate grade  Extensive intraductal component: Negative for extensive intraductal  component  Margins:       Invasive carcinoma margins: Uninvolved by invasive carcinoma       Distance from closest margin: 0.2 mm, deep       DCIS margins: Uninvolved by DCIS       Distance from closest margin: Cannot be determined; margins  not present in sections containing DCIS  Regional Lymph nodes:    Total # lymph nodes examined: 8    # Sentinel lymph nodes examined: 4    # Lymph nodes with macrometastasis (>2.0 mm): 2    # Lymph nodes with isolated tumor cells (<0.2 mm): 0    # Lymph nodes with micrometastasis (> 0.2 mm and < 2.0 mm): 1    Extranodal extension: Present  Treatment Effect:       In the breast: Definite response to presurgical therapy  in the invasive carcinoma       In the lymph nodes: No definite response to presurgical therapy in metastatic carcinoma  Residual Cancer Burden (RCB):    Primary Tumor Bed  Primary tumor bed:23 mm x 11 mm    Overall cancer cellularity: 0.5%    Percentage of cancer that is in situ: 0%     Lymph nodes    # of lymph nodes positive for metastasis: 3    Diameter of largest metastasis: 8 mm    Residual Cancer Burden (RCB): 2.553       Residual Cancer Burden Class: RCB-II  Lymphovascular Invasion: Cannot be determined with extensive retraction  changes  Pathologic Stage Classification (pTNM, AJCC 8th Edition): ypT2(m) pN1a  TNM Descriptors: y (posttreatment); m (multiple foci of invasive  carcinoma)    ASSESSMENT/PLAN 67 y.o. female presents with cT3cN1cMo 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  S.p post left modified radical mastectomy with ALND, currently on radiation.   1. Aromatase inhibitor use   2.  Malignant neoplasm of lower-outer quadrant of left breast of female, estrogen receptor positive ([place])   3. Malignant neoplasm of upper-outer quadrant of left breast in female, estrogen receptor positive ([place])   4. Port-A-Cath in place   5. Breast cancer metastasized to axillary lymph node, left ([place])   6. Anemia due to antineoplastic chemotherapy    # ypT2N1a M0 disease, ER+PR+, HER2-. Residual disease after neoadjuvant chemotherapy.  I had a lengthy discussion with patient that she has high risk of disease recurrence. Recommend her to consider  NATALEE Trial in which she will be randomized to ribociclib + letrozole vs letrzole Rationale discussed in detail. Patient declined.  Notified Electrical engineer.   # Standard treatment will be aromatase inhibitor therapy. She has been started on Letrozole and appears tolerating without problem.    Also discussed about rationale and side effects of prophylactic bisphosphonate treatment with Zometa Q 6 months to reduce skeletal events. She has obtained dental clearance and will proceed with Zometa treatment today.    # Bone density was independantly reviewed by me and discussed with patient.  She has osteopenia, with FRAX score 0.3 for 10 year risk of hip fracture and 6.5 for 10 year risk of any major fracture.  Recommend OTC calcium and vitamin D supplementation.   # Port flush Q6-8 weeks scheduled. .  # RBC microcytosis, mild anemia: Reviewed lab work up for anemia. Normal B12,  Iron panel reviewed. Ferrin 25 and TSAT 16. Accepatable. Anemia normalized. Continue monitor.    All questions were answered. The patient knows to call the clinic with any problems, questions or concerns. Total face to face encounter time for this patient visit was 25 min. >50% of the time was  spent in counseling and coordination of care.    Earlie Server, MD, PhD Hematology Oncology [facility] at [facility] Pager- [phone] 03/12/2018

## 2018-03-13 ENCOUNTER — Ambulatory Visit: Payer: BLUE CROSS/BLUE SHIELD

## 2018-03-14 ENCOUNTER — Ambulatory Visit: Payer: BLUE CROSS/BLUE SHIELD

## 2018-03-15 ENCOUNTER — Ambulatory Visit: Payer: BLUE CROSS/BLUE SHIELD

## 2018-03-18 ENCOUNTER — Ambulatory Visit: Payer: BLUE CROSS/BLUE SHIELD

## 2018-03-19 ENCOUNTER — Ambulatory Visit: Payer: BLUE CROSS/BLUE SHIELD

## 2018-03-20 ENCOUNTER — Other Ambulatory Visit: Payer: Self-pay

## 2018-03-20 ENCOUNTER — Ambulatory Visit: Payer: BLUE CROSS/BLUE SHIELD

## 2018-03-20 ENCOUNTER — Ambulatory Visit
Admission: RE | Admit: 2018-03-20 | Discharge: 2018-03-20 | Disposition: A | Discharge status: Home / Self Care | Payer: BLUE CROSS/BLUE SHIELD | Source: Ambulatory Visit | Attending: Radiation Oncology | Admitting: Radiation Oncology

## 2018-03-20 ENCOUNTER — Encounter: Payer: Self-pay | Admitting: Radiation Oncology

## 2018-03-20 VITALS — BP 132/73 | HR 94 | Temp 98.0°F | Resp 18 | Wt 195.0 lb

## 2018-03-20 DIAGNOSIS — Z923 Personal history of irradiation: Secondary | ICD-10-CM | POA: Insufficient documentation

## 2018-03-20 DIAGNOSIS — C50812 Malignant neoplasm of overlapping sites of left female breast: Secondary | ICD-10-CM

## 2018-03-20 DIAGNOSIS — Z9012 Acquired absence of left breast and nipple: Secondary | ICD-10-CM | POA: Insufficient documentation

## 2018-03-20 DIAGNOSIS — C50912 Malignant neoplasm of unspecified site of left female breast: Secondary | ICD-10-CM | POA: Diagnosis not present

## 2018-03-20 DIAGNOSIS — Z9221 Personal history of antineoplastic chemotherapy: Secondary | ICD-10-CM | POA: Insufficient documentation

## 2018-03-20 DIAGNOSIS — Z170 Estrogen receptor positive status [ER+]: Secondary | ICD-10-CM

## 2018-03-20 NOTE — Progress Notes (Signed)
Radiation Oncology Follow up Note  Name: Dominique Guzman   Date:   03/20/2018 MRN:  [id number] DOB: Mar 07, 1951    This 67 y.o. female presents to the clinic today for one-month follow-up status post radiation therapy to her left chest wall and peripheral lymphatics in patient status post left modified radical mastectomy for stage TII N1 a ER/PR positive HER-2/neu negative invasive mammary carcinoma.  REFERRING PROVIDER: Glendon Axe, MD  HPI: patient is a 67 year old female.now out 1 month having completed radiation therapy to her left chest wall and peripheral lymphatics.patient underwent neoadjuvant chemotherapy followed by left modified radical mastectomy. Should poor prognostic features including large tumor size multiple positive lymph nodes and limited axillary node dissection. She is now 1 month out from external beam radiation therapy to her left chest wall peripheral lymphatics. She is doing well. Skin has returned almost to normal. She specifically denies any cough or bone pain. She has no swelling in her left upper extremity.  COMPLICATIONS OF TREATMENT: none  FOLLOW UP COMPLIANCE: keeps appointments   PHYSICAL EXAM:  BP 132/73   Pulse 94   Temp 98 F (36.7 C)   Resp 18   Wt 195 lb (88.5 kg)   LMP  (LMP Unknown)   BMI 40.75 kg/m  Patient is status post left modified radical mastectomy. No evidence of mass or nodularity in the chest walls noted. Right breast is free of dominant mass or nodularity in 2 positions examined. No axillary or supraclavicular adenopathy is identified. No evidence of lymphedema in her left upper extremity is noted. Well-developed well-nourished patient in NAD. HEENT reveals PERLA, EOMI, discs not visualized.  Oral cavity is clear. No oral mucosal lesions are identified. Neck is clear without evidence of cervical or supraclavicular adenopathy. Lungs are clear to A&P. Cardiac examination is essentially unremarkable with regular rate and rhythm without  murmur rub or thrill. Abdomen is benign with no organomegaly or masses noted. Motor sensory and DTR levels are equal and symmetric in the upper and lower extremities. Cranial nerves II through XII are grossly intact. Proprioception is intact. No peripheral adenopathy or edema is identified. No motor or sensory levels are noted. Crude visual fields are within normal range.  RADIOLOGY RESULTS: no current films for review  PLAN: present time she is recovering nicely from her radiation therapy treatments. I'm please were overall progress. I've asked to see her back in 4-5 months for follow-up. She continues close follow-up care with medical oncology. Patient is to: Anytime with any concerns.  I would like to take this opportunity to thank you for allowing me to participate in the care of your patient.Noreene Filbert, MD

## 2018-03-21 ENCOUNTER — Ambulatory Visit: Payer: BLUE CROSS/BLUE SHIELD

## 2018-03-22 ENCOUNTER — Ambulatory Visit: Payer: BLUE CROSS/BLUE SHIELD

## 2018-03-25 ENCOUNTER — Ambulatory Visit: Payer: BLUE CROSS/BLUE SHIELD

## 2018-03-26 ENCOUNTER — Ambulatory Visit: Payer: BLUE CROSS/BLUE SHIELD

## 2018-03-27 ENCOUNTER — Ambulatory Visit: Payer: BLUE CROSS/BLUE SHIELD

## 2018-03-28 ENCOUNTER — Ambulatory Visit: Payer: BLUE CROSS/BLUE SHIELD

## 2018-03-29 ENCOUNTER — Ambulatory Visit: Payer: BLUE CROSS/BLUE SHIELD

## 2018-04-01 ENCOUNTER — Ambulatory Visit: Payer: BLUE CROSS/BLUE SHIELD

## 2018-04-02 ENCOUNTER — Ambulatory Visit: Payer: BLUE CROSS/BLUE SHIELD

## 2018-04-03 ENCOUNTER — Ambulatory Visit: Payer: BLUE CROSS/BLUE SHIELD

## 2018-04-04 ENCOUNTER — Ambulatory Visit: Payer: BLUE CROSS/BLUE SHIELD

## 2018-04-05 ENCOUNTER — Inpatient Hospital Stay: Payer: BLUE CROSS/BLUE SHIELD

## 2018-04-05 ENCOUNTER — Ambulatory Visit: Payer: BLUE CROSS/BLUE SHIELD

## 2018-04-08 ENCOUNTER — Ambulatory Visit: Payer: BLUE CROSS/BLUE SHIELD

## 2018-04-09 ENCOUNTER — Ambulatory Visit: Payer: BLUE CROSS/BLUE SHIELD

## 2018-04-10 ENCOUNTER — Ambulatory Visit: Payer: BLUE CROSS/BLUE SHIELD

## 2018-04-12 ENCOUNTER — Ambulatory Visit: Payer: BLUE CROSS/BLUE SHIELD

## 2018-04-15 ENCOUNTER — Ambulatory Visit: Payer: BLUE CROSS/BLUE SHIELD

## 2018-04-16 ENCOUNTER — Ambulatory Visit: Payer: BLUE CROSS/BLUE SHIELD

## 2018-04-16 ENCOUNTER — Other Ambulatory Visit: Payer: Self-pay | Admitting: Obstetrics and Gynecology

## 2018-04-17 ENCOUNTER — Ambulatory Visit: Payer: BLUE CROSS/BLUE SHIELD

## 2018-04-18 ENCOUNTER — Ambulatory Visit: Payer: BLUE CROSS/BLUE SHIELD

## 2018-04-19 ENCOUNTER — Ambulatory Visit: Payer: BLUE CROSS/BLUE SHIELD

## 2018-04-22 ENCOUNTER — Ambulatory Visit: Payer: BLUE CROSS/BLUE SHIELD

## 2018-04-23 ENCOUNTER — Ambulatory Visit: Payer: BLUE CROSS/BLUE SHIELD

## 2018-04-24 ENCOUNTER — Ambulatory Visit: Payer: BLUE CROSS/BLUE SHIELD

## 2018-04-25 ENCOUNTER — Other Ambulatory Visit: Payer: Self-pay | Admitting: Obstetrics and Gynecology

## 2018-04-25 ENCOUNTER — Ambulatory Visit: Payer: BLUE CROSS/BLUE SHIELD

## 2018-04-25 DIAGNOSIS — N6310 Unspecified lump in the right breast, unspecified quadrant: Secondary | ICD-10-CM

## 2018-04-26 ENCOUNTER — Ambulatory Visit: Payer: BLUE CROSS/BLUE SHIELD

## 2018-04-29 ENCOUNTER — Ambulatory Visit: Payer: BLUE CROSS/BLUE SHIELD

## 2018-04-30 ENCOUNTER — Other Ambulatory Visit: Payer: BLUE CROSS/BLUE SHIELD

## 2018-04-30 ENCOUNTER — Ambulatory Visit: Payer: BLUE CROSS/BLUE SHIELD

## 2018-05-01 ENCOUNTER — Ambulatory Visit: Payer: BLUE CROSS/BLUE SHIELD

## 2018-05-02 ENCOUNTER — Ambulatory Visit
Admission: RE | Admit: 2018-05-02 | Discharge: 2018-05-02 | Disposition: A | Discharge status: Home / Self Care | Payer: Medicare HMO | Source: Ambulatory Visit | Attending: Obstetrics and Gynecology | Admitting: Obstetrics and Gynecology

## 2018-05-02 ENCOUNTER — Other Ambulatory Visit: Payer: Self-pay | Admitting: Obstetrics and Gynecology

## 2018-05-02 ENCOUNTER — Ambulatory Visit: Payer: BLUE CROSS/BLUE SHIELD

## 2018-05-02 DIAGNOSIS — N6310 Unspecified lump in the right breast, unspecified quadrant: Secondary | ICD-10-CM

## 2018-05-03 ENCOUNTER — Ambulatory Visit: Payer: BLUE CROSS/BLUE SHIELD

## 2018-05-03 ENCOUNTER — Inpatient Hospital Stay: Payer: Medicare HMO | Attending: Oncology

## 2018-05-03 DIAGNOSIS — C773 Secondary and unspecified malignant neoplasm of axilla and upper limb lymph nodes: Secondary | ICD-10-CM | POA: Insufficient documentation

## 2018-05-03 DIAGNOSIS — Z923 Personal history of irradiation: Secondary | ICD-10-CM | POA: Diagnosis not present

## 2018-05-03 DIAGNOSIS — Z9221 Personal history of antineoplastic chemotherapy: Secondary | ICD-10-CM | POA: Diagnosis not present

## 2018-05-03 DIAGNOSIS — C50512 Malignant neoplasm of lower-outer quadrant of left female breast: Secondary | ICD-10-CM | POA: Insufficient documentation

## 2018-05-03 DIAGNOSIS — C50412 Malignant neoplasm of upper-outer quadrant of left female breast: Secondary | ICD-10-CM | POA: Diagnosis present

## 2018-05-03 DIAGNOSIS — Z95828 Presence of other vascular implants and grafts: Secondary | ICD-10-CM

## 2018-05-03 DIAGNOSIS — Z9012 Acquired absence of left breast and nipple: Secondary | ICD-10-CM | POA: Diagnosis not present

## 2018-05-03 DIAGNOSIS — Z452 Encounter for adjustment and management of vascular access device: Secondary | ICD-10-CM | POA: Insufficient documentation

## 2018-05-03 DIAGNOSIS — Z170 Estrogen receptor positive status [ER+]: Secondary | ICD-10-CM | POA: Diagnosis not present

## 2018-05-03 MED ORDER — SODIUM CHLORIDE 0.9% FLUSH
10.0000 mL | Freq: Once | INTRAVENOUS | Status: AC
  Administered 2018-05-03: 10 mL via INTRAVENOUS
  Filled 2018-05-03: qty 10

## 2018-05-03 MED ORDER — HEPARIN SOD (PORK) LOCK FLUSH 100 UNIT/ML IV SOLN
500.0000 [IU] | Freq: Once | INTRAVENOUS | Status: AC
  Administered 2018-05-03: 500 [IU] via INTRAVENOUS

## 2018-05-06 ENCOUNTER — Ambulatory Visit: Payer: BLUE CROSS/BLUE SHIELD

## 2018-05-07 ENCOUNTER — Ambulatory Visit: Payer: BLUE CROSS/BLUE SHIELD

## 2018-05-08 ENCOUNTER — Ambulatory Visit: Payer: BLUE CROSS/BLUE SHIELD

## 2018-05-09 ENCOUNTER — Ambulatory Visit: Payer: BLUE CROSS/BLUE SHIELD

## 2018-05-10 ENCOUNTER — Ambulatory Visit: Payer: BLUE CROSS/BLUE SHIELD

## 2018-05-13 ENCOUNTER — Ambulatory Visit: Payer: BLUE CROSS/BLUE SHIELD

## 2018-05-14 ENCOUNTER — Ambulatory Visit: Payer: BLUE CROSS/BLUE SHIELD

## 2018-05-15 ENCOUNTER — Ambulatory Visit: Payer: BLUE CROSS/BLUE SHIELD

## 2018-05-16 ENCOUNTER — Ambulatory Visit: Payer: BLUE CROSS/BLUE SHIELD

## 2018-05-17 ENCOUNTER — Inpatient Hospital Stay: Payer: BLUE CROSS/BLUE SHIELD

## 2018-06-14 ENCOUNTER — Inpatient Hospital Stay: Payer: BLUE CROSS/BLUE SHIELD

## 2018-06-14 ENCOUNTER — Other Ambulatory Visit: Payer: Self-pay

## 2018-06-14 ENCOUNTER — Inpatient Hospital Stay (HOSPITAL_BASED_OUTPATIENT_CLINIC_OR_DEPARTMENT_OTHER): Payer: Medicare HMO | Admitting: Oncology

## 2018-06-14 ENCOUNTER — Inpatient Hospital Stay: Payer: Medicare HMO | Attending: Oncology

## 2018-06-14 ENCOUNTER — Encounter: Payer: Self-pay | Admitting: Oncology

## 2018-06-14 VITALS — BP 105/64 | HR 80 | Temp 97.5°F | Resp 18 | Wt 198.9 lb

## 2018-06-14 DIAGNOSIS — Z9221 Personal history of antineoplastic chemotherapy: Secondary | ICD-10-CM

## 2018-06-14 DIAGNOSIS — C50412 Malignant neoplasm of upper-outer quadrant of left female breast: Secondary | ICD-10-CM

## 2018-06-14 DIAGNOSIS — Z8542 Personal history of malignant neoplasm of other parts of uterus: Secondary | ICD-10-CM

## 2018-06-14 DIAGNOSIS — C773 Secondary and unspecified malignant neoplasm of axilla and upper limb lymph nodes: Secondary | ICD-10-CM | POA: Insufficient documentation

## 2018-06-14 DIAGNOSIS — Z79811 Long term (current) use of aromatase inhibitors: Secondary | ICD-10-CM | POA: Diagnosis not present

## 2018-06-14 DIAGNOSIS — Z95828 Presence of other vascular implants and grafts: Secondary | ICD-10-CM

## 2018-06-14 DIAGNOSIS — C50512 Malignant neoplasm of lower-outer quadrant of left female breast: Secondary | ICD-10-CM

## 2018-06-14 DIAGNOSIS — Z9012 Acquired absence of left breast and nipple: Secondary | ICD-10-CM | POA: Insufficient documentation

## 2018-06-14 DIAGNOSIS — M8580 Other specified disorders of bone density and structure, unspecified site: Secondary | ICD-10-CM | POA: Insufficient documentation

## 2018-06-14 DIAGNOSIS — N951 Menopausal and female climacteric states: Secondary | ICD-10-CM | POA: Diagnosis not present

## 2018-06-14 DIAGNOSIS — Z170 Estrogen receptor positive status [ER+]: Secondary | ICD-10-CM | POA: Diagnosis not present

## 2018-06-14 LAB — CBC WITH DIFFERENTIAL/PLATELET
BASOS PCT: 1 %
Basophils Absolute: 0 10*3/uL (ref 0–0.1)
EOS ABS: 0.3 10*3/uL (ref 0–0.7)
Eosinophils Relative: 4 %
HCT: 35 % (ref 35.0–47.0)
HEMOGLOBIN: 12 g/dL (ref 12.0–16.0)
Lymphocytes Relative: 16 %
Lymphs Abs: 1.4 10*3/uL (ref 1.0–3.6)
MCH: 29.4 pg (ref 26.0–34.0)
MCHC: 34.4 g/dL (ref 32.0–36.0)
MCV: 85.7 fL (ref 80.0–100.0)
Monocytes Absolute: 0.8 10*3/uL (ref 0.2–0.9)
Monocytes Relative: 9 %
NEUTROS PCT: 70 %
Neutro Abs: 6.1 10*3/uL (ref 1.4–6.5)
Platelets: 276 10*3/uL (ref 150–440)
RBC: 4.08 MIL/uL (ref 3.80–5.20)
RDW: 14.1 % (ref 11.5–14.5)
WBC: 8.7 10*3/uL (ref 3.6–11.0)

## 2018-06-14 LAB — COMPREHENSIVE METABOLIC PANEL
ALT: 18 U/L (ref 0–44)
AST: 26 U/L (ref 15–41)
Albumin: 4 g/dL (ref 3.5–5.0)
Alkaline Phosphatase: 69 U/L (ref 38–126)
Anion gap: 10 (ref 5–15)
BILIRUBIN TOTAL: 0.6 mg/dL (ref 0.3–1.2)
BUN: 21 mg/dL (ref 8–23)
CO2: 26 mmol/L (ref 22–32)
Calcium: 9.9 mg/dL (ref 8.9–10.3)
Chloride: 100 mmol/L (ref 98–111)
Creatinine, Ser: 0.75 mg/dL (ref 0.44–1.00)
GFR calc Af Amer: >60 mL/min (ref >60)
Glucose, Bld: 190 mg/dL — ABNORMAL HIGH (ref 70–99)
Potassium: 4 mmol/L (ref 3.5–5.1)
Sodium: 136 mmol/L (ref 135–145)
TOTAL PROTEIN: 7 g/dL (ref 6.5–8.1)

## 2018-06-14 MED ORDER — HEPARIN SOD (PORK) LOCK FLUSH 100 UNIT/ML IV SOLN
500.0000 [IU] | Freq: Once | INTRAVENOUS | Status: AC
  Administered 2018-06-14: 500 [IU] via INTRAVENOUS
  Filled 2018-06-14: qty 5

## 2018-06-14 MED ORDER — SODIUM CHLORIDE 0.9% FLUSH
10.0000 mL | Freq: Once | INTRAVENOUS | Status: AC
  Administered 2018-06-14: 10 mL via INTRAVENOUS
  Filled 2018-06-14: qty 10

## 2018-06-15 MED ORDER — LETROZOLE 2.5 MG PO TABS: 2.5000 mg | ORAL_TABLET | Freq: Every day | ORAL | 3 refills | Status: DC

## 2018-06-15 NOTE — Progress Notes (Signed)
[place] [facility] Follow Up Visit.  Patient Care Team: Glendon Axe, MD as PCP - General (Internal Medicine) Rico Junker, RN as Oncology Nurse Navigator Earlie Server, MD as Consulting Physician (Oncology) Leonie Green, MD as Referring Physician (Surgery) Noreene Filbert, MD as Referring Physician (Radiation Oncology)  Reason for visit Follow up for treatment of breast cancer.   Pertinent Oncology History Dominique Guzman 67 y.o. female with PMH listed as below is referred by Dr.Schermerhorn to [name] for evaluation and management of newly diagnosed breast cancer. Patient had a history a left breast mass biopsy followed by breast excisonal biopsy in 2015 with benign pathology.  She also has a history of Uterine cancer which was treated with RT with Dr.Chrystal.  She underwent routine screening mammogram on 04/17/2017 which recommended a possible mass on the left breast. Left diagnostic mammogram showed a highly suspicious left breast mass at 4 o'clock, 7 cm from the nipple and 1 o'clock,,  About 3 cm from the nipple. Enlarged left axillary lymph node suspicious for metastatic disease.  1 US breast confirmed a left breast mass of 2.1cm, 4 o'clock, and 1.1 cm mass at 1 o'clock.The masses at 1 and 4 o'clock are separated by approximately 4 cm sonographically 2 Biopsy of the two mass and axillary lymph node revealed invasive carcinoma, grade 2. Both are ER,PR positive. HER2 equivocal, FISH pending. 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  3 05/14/2017 MRI breast showed Two sites of biopsy proven malignancy seen in the left breast, which span up to 11.3 cm. There are intervening areas of non mass enhancement and a small suspicious enhancing mass. 4 Case was discussed on breast tumor board on 05/21/2017 and the panel agree with neoadjuvant ddAC to T followed by surgery. Clinically cT3N1M0  #Cancer treatment  patient completed s/p 4 cycles ddAC and weekly taxol x 12,  on 10/26/2017 patient underwent that surgery.  Left axillary sentinel lymph node was positive, so Dr. Tamala Julian proceeded with left mastectomy and left axillary lymph node dissection. Pathology showed ypT2(m) pN1a, ER/PR positive, HER2 FISH negative, grade 2, negative margin, invasive mammary carcinoma.  S/p adjuvant RT.  Declined NATALEE Trial Clinical trial for pabociclib and letrozole.    May 2019 Started on Letrozole   # Genetic testing Testing did not reveal a pathogenic mutation in any of the genes analyzed. A copy of the genetic test report will be scanned into Epic under the Media tab.The genes analyzed were the 83 genes on [facility]'s Multi-Cancer panel (ALK, APC, ATM, AXIN2, BAP1, BARD1, BLM, BMPR1A, BRCA1, BRCA2, BRIP1, CASR, CDC73, CDH1, CDK4, CDKN1B, CDKN1C, CDKN2A, CEBPA, CHEK2, CTNNA1, DICER1, DIS3L2, EGFR, EPCAM, FH, FLCN, GATA2, GPC3, GREM1, HOXB13, HRAS, KIT, MAX, MEN1, MET, MITF, MLH1, MSH2, MSH3, MSH6, MUTYH, NBN, NF1, NF2, NTHL1, PALB2, PDGFRA, PHOX2B, PMS2, POLD1, POLE, POT1, PRKAR1A, PTCH1, PTEN, RAD50, RAD51C, RAD51D, RB1, RECQL4, RET, RUNX1, SDHA, SDHAF2, SDHB, SDHC, SDHD, SMAD4, SMARCA4, SMARCB1, SMARCE1, STK11, SUFU, TERC, TERT, TMEM127, TP53, TSC1, TSC2, VHL, WRN, WT1).    INTERVAL HISTORY Patient presents for follow up for management of left breast cancer ypT2 yp N1  # fatigue Reports doing well, energy level has improved.  # On letrozole, has hot flush, manageable. No joint pain or bone pain.  # Breast cancer: she has felt right breast being lumpy after she saw me last time. She was seen by her GYN and mentioned it.  Unilateral right breast diagnostic mammogram and ultrasound were obtained and was negative. After  that she feels breast "feels fine".    Review of Systems  Constitutional: Positive for malaise/fatigue. Negative for chills, fever and weight loss.  HENT: Negative for congestion, ear discharge, ear pain, nosebleeds, sinus pain and sore throat.   Eyes:  Negative for double vision, photophobia, pain, discharge and redness.  Respiratory: Negative for cough, hemoptysis, sputum production, shortness of breath and wheezing.   Cardiovascular: Negative for chest pain, palpitations, orthopnea, claudication and leg swelling.  Gastrointestinal: Negative for abdominal pain, blood in stool, constipation, diarrhea, heartburn, melena, nausea and vomiting.  Genitourinary: Negative for dysuria, flank pain, frequency and hematuria.  Musculoskeletal: Negative for back pain, myalgias and neck pain.  Skin: Negative for itching and rash.  Neurological: Negative for dizziness, tingling, tremors, focal weakness, weakness and headaches.  Endo/Heme/Allergies: Negative for environmental allergies. Does not bruise/bleed easily.  Psychiatric/Behavioral: Negative for depression, hallucinations, substance abuse and suicidal ideas. The patient is not nervous/anxious.     MEDICAL HISTORY: Past Medical History:  Diagnosis Date  . Anemia   . Arthritis    HIP-LEFT  . Benign neoplasm of colon   . Breast cancer ([place])   . Cancer ([place])   . Diabetes mellitus without complication ([place])   . Genetic testing 01/10/2018   Multi-Cancer panel (83 genes) @ [facility] - No pathogenic mutations detected  . H/O osteopenia   . History of hiatal hernia   . Hypertension   . Sleep apnea    USE CPAP  . Uterine cancer [facility])     SURGICAL HISTORY: Past Surgical History:  Procedure Laterality Date  . ABDOMINAL HYSTERECTOMY    . BREAST BIOPSY Left 04/2014   negative  . BREAST EXCISIONAL BIOPSY Left 2015  . CESAREAN SECTION     X2  . COLONOSCOPY WITH PROPOFOL N/A 04/15/2015   Procedure: COLONOSCOPY WITH PROPOFOL;  Surgeon: Hulen Luster, MD;  Location: [facility] ENDOSCOPY;  Service: Gastroenterology;  Laterality: N/A;  . HERNIA REPAIR    . MASTECTOMY Left 2018  . MASTECTOMY MODIFIED RADICAL Left 10/26/2017   Procedure: MASTECTOMY MODIFIED RADICAL;  Surgeon: Leonie Green, MD;  Location:  [facility] ORS;  Service: General;  Laterality: Left;  [name] MASTECTOMY W/ SENTINEL NODE BIOPSY Left 10/26/2017   Procedure: MASTECTOMY WITH SENTINEL LYMPH NODE BIOPSY;  Surgeon: Leonie Green, MD;  Location: [facility] ORS;  Service: General;  Laterality: Left;  . PORTACATH PLACEMENT Right 05/22/2017   Procedure: INSERTION PORT-A-CATH;  Surgeon: Leonie Green, MD;  Location: [facility] ORS;  Service: General;  Laterality: Right;    SOCIAL HISTORY: Social History   Socioeconomic History  . Marital status: Married    Spouse name: Not on file  . Number of children: Not on file  . Years of education: Not on file  . Highest education level: Not on file  Occupational History  . Not on file  Social Needs  . Financial resource strain: Not on file  . Food insecurity:    Worry: Not on file    Inability: Not on file  . Transportation needs:    Medical: Not on file    Non-medical: Not on file  Tobacco Use  . Smoking status: Never Smoker  . Smokeless tobacco: Never Used  Substance and Sexual Activity  . Alcohol use: No  . Drug use: No  . Sexual activity: Not on file  Lifestyle  . Physical activity:    Days per week: Not on file    Minutes per session: Not on file  . Stress: Not on  file  Relationships  . Social connections:    Talks on phone: Not on file    Gets together: Not on file    Attends religious service: Not on file    Active member of club or organization: Not on file    Attends meetings of clubs or organizations: Not on file    Relationship status: Not on file  . Intimate partner violence:    Fear of current or ex partner: Not on file    Emotionally abused: Not on file    Physically abused: Not on file    Forced sexual activity: Not on file  Other Topics Concern  . Not on file  Social History Narrative  . Not on file    FAMILY HISTORY Family History  Problem Relation Age of Onset  . Diabetes Mother   . Diabetes Father   . Melanoma Father 57       on finger  .  Diabetes Sister   . Pancreatic cancer Paternal Grandfather        deceased 4s  . Breast cancer Other 44       paternal grandmother's mother    ALLERGIES:  has No Known Allergies.  MEDICATIONS:  Current Outpatient Medications  Medication Sig Dispense Refill  . Calcium Carb-Cholecalciferol (CALCIUM 600-D PO) Take 1 tablet by mouth daily.    . Cinnamon 500 MG capsule Take 1,000 mg by mouth daily.     . cyanocobalamin 1000 MCG tablet Take 1,000 mcg by mouth daily.    . diphenhydrAMINE (BENADRYL) 25 MG tablet Take 25 mg by mouth every 6 (six) hours as needed for allergies.     [name] FIBER PO Take 2 each by mouth daily.     [name] GLUCOSAMINE-CHONDROITIN PO Take 1 tablet by mouth daily.    [name] HYDROcodone-acetaminophen (NORCO/VICODIN) 5-325 MG tablet Take 1-2 tablets by mouth every 6 (six) hours as needed for moderate pain. 16 tablet 0  . letrozole (FEMARA) 2.5 MG tablet Take 1 tablet (2.5 mg total) by mouth daily. 30 tablet 3  . lidocaine-prilocaine (EMLA) cream Apply 1 application topically as needed. Apply small amount to port site at least 1 hour prior to it being accessed, cover with plastic wrap 30 g 3  . lisinopril-hydrochlorothiazide (PRINZIDE,ZESTORETIC) 20-12.5 MG per tablet Take 1 tablet by mouth daily.    [name] loperamide (IMODIUM) 2 MG capsule Take 1 capsule (2 mg total) by mouth See admin instructions. With onset of loose stool, take '4mg'$  followed by '2mg'$  every 2 hours until 12 hours have passed without loose bowel movement. Maximum: 16 mg/day 120 capsule 1  . metFORMIN (GLUCOPHAGE) 500 MG tablet Take 1,000 mg by mouth 2 (two) times daily.     . Multiple Vitamin (MULTIVITAMIN) tablet Take 1 tablet by mouth daily.    . ondansetron (ZOFRAN) 8 MG tablet Take 1 tablet (8 mg total) by mouth every 8 (eight) hours as needed for nausea or vomiting. 120 tablet 0  . pioglitazone (ACTOS) 15 MG tablet Take 30 mg by mouth daily.   1  . pyridOXINE (VITAMIN B-6) 100 MG tablet Take 1 tablet (100 mg total) by  mouth daily. 90 tablet 3  . silver sulfADIAZINE (SILVADENE) 1 % cream Apply 1 application topically daily. 50 g 0  . chlorhexidine (PERIDEX) 0.12 % solution USE AS DIRECTED 15 ML IN THE MOUTH OR THROAT TWICE A DAY (Patient not taking: Reported on 06/14/2018) 473 mL 0  . fluticasone (FLONASE) 50 MCG/ACT nasal spray Place 2  sprays into both nostrils daily as needed for allergies.     [name] triamcinolone cream (KENALOG) 0.1 % Apply 1 application topically daily as needed (for rash).    . valACYclovir (VALTREX) 1000 MG tablet Take 1 tablet (1,000 mg total) by mouth 2 (two) times daily. (Patient not taking: Reported on 06/14/2018) 14 tablet 0   No current facility-administered medications for this visit.     PHYSICAL EXAMINATION:  ECOG PERFORMANCE STATUS: 1 - Symptomatic but completely ambulatory  Vitals:   06/14/18 1349  BP: 105/64  Pulse: 80  Resp: 18  Temp: (!) 97.5 F (36.4 C)    Filed Weights   06/14/18 1349  Weight: 198 lb 14.4 oz (90.2 kg)     Physical Exam  Constitutional: She is oriented to person, place, and time and well-developed, well-nourished, and in no distress. No distress.  HENT:  Head: Normocephalic and atraumatic.  Right Ear: External ear normal.  Left Ear: External ear normal.  Nose: Nose normal.  Mouth/Throat: Oropharynx is clear and moist. No oropharyngeal exudate.  Eyes: Pupils are equal, round, and reactive to light. Conjunctivae and EOM are normal. Right eye exhibits no discharge. Left eye exhibits no discharge. No scleral icterus.  Neck: Normal range of motion. Neck supple. No JVD present.  Cardiovascular: Normal rate, regular rhythm and normal heart sounds.  No murmur heard. Pulmonary/Chest: Effort normal and breath sounds normal. No respiratory distress. She has no wheezes. She has no rales. She exhibits no tenderness.  Abdominal: Soft. Bowel sounds are normal. She exhibits no distension and no mass. There is no tenderness. There is no rebound and no  guarding.  Musculoskeletal: Normal range of motion. She exhibits no edema or tenderness.  Lymphadenopathy:    She has no cervical adenopathy.  Neurological: She is alert and oriented to person, place, and time. No cranial nerve deficit. She exhibits normal muscle tone. Coordination normal.  Skin: Skin is warm and dry. No rash noted. She is not diaphoretic. No erythema.  Psychiatric: Affect and judgment normal.  Breast exam is performed in seated and lying down position. Patient is status post left mastectomy. There is no evidence of any chest wall recurrence. No evidence of bilateral axillary adenopathy. No palpable mass of right breast.   ECOG 0  LABORATORY DATA: CBC Latest Ref Rng & Units 06/14/2018 03/12/2018 01/30/2018  WBC 3.6 - 11.0 K/uL 8.7 7.6 8.7  Hemoglobin 12.0 - 16.0 g/dL 12.0 12.3 11.9(L)  Hematocrit 35.0 - 47.0 % 35.0 35.1 34.8(L)  Platelets 150 - 440 K/uL 276 290 283   CMP Latest Ref Rng & Units 06/14/2018 03/12/2018 12/31/2017  Glucose 70 - 99 mg/dL 190(H) 270(H) 168(H)  BUN 8 - 23 mg/dL _0 Creatinine 0.44 - 1.00 mg/dL 0.75 0.73 0.67  Sodium 135 - 145 mmol/L 136 137 132(L)  Potassium 3.5 - 5.1 mmol/L 4.0 4.0 3.8  Chloride 98 - 111 mmol/L 100 103 98(L)  CO2 22 - 32 mmol/L 26 25 21(L)  Calcium 8.9 - 10.3 mg/dL 9.9 9.8 9.9  Total Protein 6.5 - 8.1 g/dL 7.0 6.8 7.3  Total Bilirubin 0.3 - 1.2 mg/dL 0.6 0.4 0.5  Alkaline Phos 38 - 126 U/L 69 80 93  AST 15 - 41 U/L _1 ALT 0 - 44 U/L _2 Normal  B12 and Folate. Iron panel was checked in September showed no signs of iron deficiency  PATHOLOGY 10/27/2017  Surgical Pathology  CASE: [id number]  PATIENT: [facility]  [place]  Surgical Pathology Report  SPECIMEN SUBMITTED:  A. Sentinel lymph node #1, left axilla  B. Sentinel lymph nodes #2 and 3, left axilla  C. Breast, left; stitch outer end of skin ellipse, and axillary lymph  nodes  DIAGNOSIS:  A. SENTINEL LYMPH NODE #1, LEFT AXILLA; EXCISION:  -  METASTATIC MAMMARY CARCINOMA, ONE LYMPH NODE, 5 MM METASTASIS (1/1).   B. SENTINEL LYMPH NODES #2 AND #3, LEFT AXILLA; EXCISION:  - NEGATIVE FOR MALIGNANCY, THREE LYMPH NODES (0/3).   C. BREAST, LEFT; MODIFIED RADICAL MASTECTOMY:  - INVASIVE MAMMARY CARCINOMA, TWO SITES, SEE SUMMARY BELOW.  - BIOPSY SITE CHANGES AT TWO SITES; ONE MARKER CLIP FOUND.  - METASTATIC MAMMARY CARCINOMA IN TWO OF FOUR AXILLARY LYMPH NODES, WITH  LARGER METASTASIS (8 MM) LOCATED IN NODE WITH MARKER CLIP (2/4).   Comment:  ER, PR, and HER2 were assessed on the previous core biopsies  ([id number], 04/25/2017) with results summarized as follows:  ER IHC (SP1): Both tumors positive, >90%, strong staining  PR IHC (IE2): Both tumors positive, 1:00 tumor 51-90%, mod to strong,  and 4:00 tumor >90% with strong staining  HER2 IHC (HercepTest): Both tumors equivocal, score 2+  HER2 FISH (DAKO IQFISH PharmDX): Both tumors negative (not amplified)   Surgical Pathology Cancer Case Summary  INVASIVE CARCINOMA OF THE BREAST:  Procedure: Total mastectomy  Specimen Laterality: Left  Histologic Type: Invasive carcinoma of no special type  Histologic Grade ([place] Histologic Score)       Glandular (Acinar)/Tubular Differentiation: Score 3       Nuclear Pleomorphism: Score 2       Mitotic Rate: Score 1       Overall Grade: Grade 2  Tumor Size: 23 mm (4-5:00 posterior tumor); 12 mm (1:00 tumor)  Ductal Carcinoma In Situ (DCIS): Present  DCIS Nuclear Grade: Intermediate grade  Extensive intraductal component: Negative for extensive intraductal  component  Margins:       Invasive carcinoma margins: Uninvolved by invasive carcinoma       Distance from closest margin: 0.2 mm, deep       DCIS margins: Uninvolved by DCIS       Distance from closest margin: Cannot be determined; margins  not present in sections containing DCIS  Regional Lymph nodes:    Total # lymph nodes  examined: 8    # Sentinel lymph nodes examined: 4    # Lymph nodes with macrometastasis (>2.0 mm): 2    # Lymph nodes with isolated tumor cells (<0.2 mm): 0    # Lymph nodes with micrometastasis (> 0.2 mm and < 2.0 mm): 1    Extranodal extension: Present  Treatment Effect:       In the breast: Definite response to presurgical therapy in the invasive carcinoma       In the lymph nodes: No definite response to presurgical therapy in metastatic carcinoma  Residual Cancer Burden (RCB):    Primary Tumor Bed  Primary tumor bed:23 mm x 11 mm    Overall cancer cellularity: 0.5%    Percentage of cancer that is in situ: 0%     Lymph nodes    # of lymph nodes positive for metastasis: 3    Diameter of largest metastasis: 8 mm    Residual Cancer Burden (RCB): 2.553       Residual Cancer Burden Class: RCB-II  Lymphovascular Invasion: Cannot be determined with extensive retraction  changes  Pathologic Stage Classification (pTNM, AJCC 8th Edition): ypT2(m) pN1a  TNM Descriptors: y (posttreatment); m (multiple foci of invasive  carcinoma)    ASSESSMENT/PLAN 67 y.o. female presents with cT3cN1cMo 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  S.p post left modified radical mastectomy with ALND, currently on radiation.   1. Malignant neoplasm of lower-outer quadrant of left breast of female, estrogen receptor positive ([place])   2. Aromatase inhibitor use   3. Port-A-Cath in place   4. History of uterine cancer   5. Osteopenia, unspecified location    # ypT2N1a M0 disease, ER+PR+, HER2-. Residual disease after neoadjuvant chemotherapy s/p RT/  On Letrozole since May 2019  Tolerates well.  Recommend Mammogram in January 2019.  # Hot flush, due to letrozole. Manageble. Monitor.  # Osteopenia, s/p Zometa in June 2019. Plan repeat  zometa in December.  She has osteopenia, with FRAX score 0.3 for 10 year risk of hip fracture and 6.5 for 10 year risk of any major fracture.  Recommend OTC calcium and vitamin D supplementation.   # Port flush Q6-8 weeks will schedule..  # RBC microcytosis, mild anemia: resolve.d    We spent sufficient time to discuss many aspect of care, questions were answered to patient's satisfaction. Total face to face encounter time for this patient visit was 25 min. >50% of the time was  spent in counseling and coordination of care.   Earlie Server, MD, PhD Hematology Oncology [facility] Pager- [phone] 06/15/2018

## 2018-06-28 ENCOUNTER — Inpatient Hospital Stay: Payer: BLUE CROSS/BLUE SHIELD

## 2018-06-28 ENCOUNTER — Other Ambulatory Visit: Payer: BLUE CROSS/BLUE SHIELD

## 2018-06-28 ENCOUNTER — Ambulatory Visit: Payer: BLUE CROSS/BLUE SHIELD | Admitting: Oncology

## 2018-07-26 ENCOUNTER — Inpatient Hospital Stay: Payer: BLUE CROSS/BLUE SHIELD

## 2018-08-02 ENCOUNTER — Inpatient Hospital Stay: Payer: Medicare HMO | Attending: Oncology

## 2018-08-02 DIAGNOSIS — C773 Secondary and unspecified malignant neoplasm of axilla and upper limb lymph nodes: Secondary | ICD-10-CM | POA: Insufficient documentation

## 2018-08-02 DIAGNOSIS — Z923 Personal history of irradiation: Secondary | ICD-10-CM | POA: Insufficient documentation

## 2018-08-02 DIAGNOSIS — Z9012 Acquired absence of left breast and nipple: Secondary | ICD-10-CM | POA: Diagnosis not present

## 2018-08-02 DIAGNOSIS — C50412 Malignant neoplasm of upper-outer quadrant of left female breast: Secondary | ICD-10-CM | POA: Diagnosis not present

## 2018-08-02 DIAGNOSIS — Z9221 Personal history of antineoplastic chemotherapy: Secondary | ICD-10-CM | POA: Insufficient documentation

## 2018-08-02 DIAGNOSIS — Z452 Encounter for adjustment and management of vascular access device: Secondary | ICD-10-CM | POA: Diagnosis not present

## 2018-08-02 DIAGNOSIS — C50512 Malignant neoplasm of lower-outer quadrant of left female breast: Secondary | ICD-10-CM | POA: Diagnosis not present

## 2018-08-02 DIAGNOSIS — Z95828 Presence of other vascular implants and grafts: Secondary | ICD-10-CM

## 2018-08-02 MED ORDER — SODIUM CHLORIDE 0.9% FLUSH
10.0000 mL | Freq: Once | INTRAVENOUS | Status: AC
  Administered 2018-08-02: 10 mL via INTRAVENOUS
  Filled 2018-08-02: qty 10

## 2018-08-02 MED ORDER — HEPARIN SOD (PORK) LOCK FLUSH 100 UNIT/ML IV SOLN
500.0000 [IU] | Freq: Once | INTRAVENOUS | Status: AC
  Administered 2018-08-02: 500 [IU] via INTRAVENOUS

## 2018-08-09 ENCOUNTER — Inpatient Hospital Stay: Payer: BLUE CROSS/BLUE SHIELD

## 2018-08-29 ENCOUNTER — Ambulatory Visit
Admission: RE | Admit: 2018-08-29 | Discharge: 2018-08-29 | Disposition: A | Discharge status: Home / Self Care | Payer: Medicare HMO | Source: Ambulatory Visit | Attending: Radiation Oncology | Admitting: Radiation Oncology

## 2018-08-29 ENCOUNTER — Other Ambulatory Visit: Payer: Self-pay

## 2018-08-29 ENCOUNTER — Encounter: Payer: Self-pay | Admitting: *Deleted

## 2018-08-29 ENCOUNTER — Encounter: Payer: Self-pay | Admitting: Radiation Oncology

## 2018-08-29 VITALS — BP 126/67 | HR 87 | Temp 98.9°F | Resp 18 | Wt 208.0 lb

## 2018-08-29 DIAGNOSIS — Z923 Personal history of irradiation: Secondary | ICD-10-CM | POA: Insufficient documentation

## 2018-08-29 DIAGNOSIS — Z79811 Long term (current) use of aromatase inhibitors: Secondary | ICD-10-CM | POA: Insufficient documentation

## 2018-08-29 DIAGNOSIS — C50812 Malignant neoplasm of overlapping sites of left female breast: Secondary | ICD-10-CM | POA: Diagnosis present

## 2018-08-29 DIAGNOSIS — Z170 Estrogen receptor positive status [ER+]: Secondary | ICD-10-CM | POA: Diagnosis not present

## 2018-08-29 NOTE — Progress Notes (Signed)
Radiation Oncology Follow up Note  Name: Dominique Guzman   Date:   08/29/2018 MRN:  [id number] DOB: [birth year]    This 67 y.o. female presents to the clinic today for six-month follow-up status post eft chest wall and peripheral lymphatic radiation for stage TII N1 ER/PR positive HER-2/neu negative invasive mammary carcinoma status post left modified radical mastectomy.  REFERRING PROVIDER: Glendon Axe, MD  HPI: patient is a 67 year old female now seen out 6 months having completed radiation therapy to her left chest wall peripheral lymphatics after she underwent neoadjuvant chemotherapy followed by left modified radical mastectomy..she is seen today in routine follow-up and is doing well. She did in the past feel something in her right breast had workup including mammogram and ultrasound which was negative. She specifically denies any chest wall tenderness cough or bone pain or any swelling in her right left upper extremity.she's currently on Femara tolerate that well without side effect.  COMPLICATIONS OF TREATMENT: none  FOLLOW UP COMPLIANCE: keeps appointments   PHYSICAL EXAM:  BP 126/67 (BP Location: Right Arm, Patient Position: Sitting)   Pulse 87   Temp 98.9 F (37.2 C) (Tympanic)   Resp 18   Wt 208 lb 0.1 oz (94.3 kg)   LMP  (LMP Unknown)   BMI 43.47 kg/m  Patient is status post left modified radical mastectomy. Chest walls healed well. No mass or nodularity in the chest wall is noted. Right breast is free of dominant mass or nodularity in 2 positions examined. No axillary or supraclavicular adenopathy is identified. No evidence of lymphedema is noted.Well-developed well-nourished patient in NAD. HEENT reveals PERLA, EOMI, discs not visualized.  Oral cavity is clear. No oral mucosal lesions are identified. Neck is clear without evidence of cervical or supraclavicular adenopathy. Lungs are clear to A&P. Cardiac examination is essentially unremarkable with regular rate and  rhythm without murmur rub or thrill. Abdomen is benign with no organomegaly or masses noted. Motor sensory and DTR levels are equal and symmetric in the upper and lower extremities. Cranial nerves II through XII are grossly intact. Proprioception is intact. No peripheral adenopathy or edema is identified. No motor or sensory levels are noted. Crude visual fields are within normal range.  RADIOLOGY RESULTS: mammogram and ultrasoundof right breast reviewed and compatible with the above-stated findings  PLAN: present time patient is doing well with no evidence of disease. I'm please were overall progress. I've asked to see her back in a year for follow-up. Patient is to call sooner with any concerns.she continues on Femara without side effect. Patient is to call with any concerns.  I would like to take this opportunity to thank you for allowing me to participate in the care of your patient.Noreene Filbert, MD

## 2018-09-06 ENCOUNTER — Inpatient Hospital Stay: Payer: BLUE CROSS/BLUE SHIELD

## 2018-09-12 ENCOUNTER — Other Ambulatory Visit: Payer: Self-pay | Admitting: Oncology

## 2018-09-13 ENCOUNTER — Inpatient Hospital Stay: Payer: Medicare HMO | Attending: Oncology

## 2018-09-13 ENCOUNTER — Other Ambulatory Visit: Payer: Self-pay | Admitting: Oncology

## 2018-09-13 ENCOUNTER — Inpatient Hospital Stay (HOSPITAL_BASED_OUTPATIENT_CLINIC_OR_DEPARTMENT_OTHER): Payer: Medicare HMO | Admitting: Oncology

## 2018-09-13 ENCOUNTER — Encounter: Payer: Self-pay | Admitting: Oncology

## 2018-09-13 ENCOUNTER — Inpatient Hospital Stay: Payer: Medicare HMO

## 2018-09-13 ENCOUNTER — Other Ambulatory Visit: Payer: Self-pay

## 2018-09-13 VITALS — BP 118/72 | HR 84 | Temp 98.0°F | Resp 18 | Wt 207.9 lb

## 2018-09-13 DIAGNOSIS — Z170 Estrogen receptor positive status [ER+]: Principal | ICD-10-CM

## 2018-09-13 DIAGNOSIS — C50512 Malignant neoplasm of lower-outer quadrant of left female breast: Secondary | ICD-10-CM | POA: Insufficient documentation

## 2018-09-13 DIAGNOSIS — Z79811 Long term (current) use of aromatase inhibitors: Secondary | ICD-10-CM | POA: Insufficient documentation

## 2018-09-13 DIAGNOSIS — M8580 Other specified disorders of bone density and structure, unspecified site: Secondary | ICD-10-CM | POA: Diagnosis not present

## 2018-09-13 DIAGNOSIS — Z95828 Presence of other vascular implants and grafts: Secondary | ICD-10-CM

## 2018-09-13 DIAGNOSIS — E860 Dehydration: Secondary | ICD-10-CM

## 2018-09-13 DIAGNOSIS — Z8542 Personal history of malignant neoplasm of other parts of uterus: Secondary | ICD-10-CM

## 2018-09-13 LAB — CBC WITH DIFFERENTIAL/PLATELET
Abs Immature Granulocytes: 0.04 10*3/uL (ref 0.00–0.07)
BASOS PCT: 1 %
Basophils Absolute: 0 10*3/uL (ref 0.0–0.1)
Eosinophils Absolute: 0.4 10*3/uL (ref 0.0–0.5)
Eosinophils Relative: 5 %
HCT: 36.2 % (ref 36.0–46.0)
Hemoglobin: 11.8 g/dL — ABNORMAL LOW (ref 12.0–15.0)
Immature Granulocytes: 1 %
Lymphocytes Relative: 18 %
Lymphs Abs: 1.3 10*3/uL (ref 0.7–4.0)
MCH: 28.6 pg (ref 26.0–34.0)
MCHC: 32.6 g/dL (ref 30.0–36.0)
MCV: 87.7 fL (ref 80.0–100.0)
Monocytes Absolute: 0.7 10*3/uL (ref 0.1–1.0)
Monocytes Relative: 10 %
NEUTROS PCT: 65 %
Neutro Abs: 5 10*3/uL (ref 1.7–7.7)
PLATELETS: 297 10*3/uL (ref 150–400)
RBC: 4.13 MIL/uL (ref 3.87–5.11)
RDW: 13.3 % (ref 11.5–15.5)
WBC: 7.5 10*3/uL (ref 4.0–10.5)
nRBC: 0 % (ref 0.0–0.2)

## 2018-09-13 LAB — COMPREHENSIVE METABOLIC PANEL
ALT: 14 U/L (ref 0–44)
AST: 22 U/L (ref 15–41)
Albumin: 4 g/dL (ref 3.5–5.0)
Alkaline Phosphatase: 75 U/L (ref 38–126)
Anion gap: 8 (ref 5–15)
BUN: 24 mg/dL — ABNORMAL HIGH (ref 8–23)
CO2: 28 mmol/L (ref 22–32)
Calcium: 9.6 mg/dL (ref 8.9–10.3)
Chloride: 103 mmol/L (ref 98–111)
Creatinine, Ser: 0.75 mg/dL (ref 0.44–1.00)
GFR calc Af Amer: >60 mL/min (ref >60)
Glucose, Bld: 178 mg/dL — ABNORMAL HIGH (ref 70–99)
Potassium: 3.9 mmol/L (ref 3.5–5.1)
Sodium: 139 mmol/L (ref 135–145)
Total Bilirubin: 0.4 mg/dL (ref 0.3–1.2)
Total Protein: 7 g/dL (ref 6.5–8.1)

## 2018-09-13 MED ORDER — SODIUM CHLORIDE 0.9% FLUSH
10.0000 mL | Freq: Once | INTRAVENOUS | Status: AC
  Administered 2018-09-13: 10 mL via INTRAVENOUS
  Filled 2018-09-13: qty 10

## 2018-09-13 MED ORDER — SODIUM CHLORIDE 0.9 % IV SOLN
INTRAVENOUS | Status: DC
  Administered 2018-09-13: 15:00:00 via INTRAVENOUS
  Filled 2018-09-13: qty 250

## 2018-09-13 MED ORDER — HEPARIN SOD (PORK) LOCK FLUSH 100 UNIT/ML IV SOLN
500.0000 [IU] | Freq: Once | INTRAVENOUS | Status: AC
  Administered 2018-09-13: 500 [IU] via INTRAVENOUS
  Filled 2018-09-13: qty 5

## 2018-09-13 MED ORDER — CIPROFLOXACIN HCL 500 MG PO TABS: 500.0000 mg | ORAL_TABLET | Freq: Two times a day (BID) | ORAL | 0 refills | Status: DC

## 2018-09-13 MED ORDER — ZOLEDRONIC ACID 4 MG/100ML IV SOLN
4.0000 mg | Freq: Once | INTRAVENOUS | Status: AC
  Administered 2018-09-13: 4 mg via INTRAVENOUS
  Filled 2018-09-13: qty 100

## 2018-09-13 NOTE — Progress Notes (Signed)
[facility] Follow Up Visit.  Patient Care Team: Glendon Axe, MD as PCP - General (Internal Medicine) Rico Junker, RN as Oncology Nurse Navigator Earlie Server, MD as Consulting Physician (Oncology) Leonie Green, MD as Referring Physician (Surgery) Noreene Filbert, MD as Referring Physician (Radiation Oncology)  Reason for visit Follow up for treatment of breast cancer.   Pertinent Oncology History Dominique Guzman 67 y.o. female with PMH listed as below is referred by Dr.Schermerhorn to [name] for evaluation and management of newly diagnosed breast cancer. Patient had a history a left breast mass biopsy followed by breast excisonal biopsy in 2015 with benign pathology.  She also has a history of Uterine cancer which was treated with RT with Dr.Chrystal.  She underwent routine screening mammogram on 04/17/2017 which recommended a possible mass on the left breast. Left diagnostic mammogram showed a highly suspicious left breast mass at 4 o'clock, 7 cm from the nipple and 1 o'clock,,  About 3 cm from the nipple. Enlarged left axillary lymph node suspicious for metastatic disease.  1 US breast confirmed a left breast mass of 2.1cm, 4 o'clock, and 1.1 cm mass at 1 o'clock.The masses at 1 and 4 o'clock are separated by approximately 4 cm sonographically 2 Biopsy of the two mass and axillary lymph node revealed invasive carcinoma, grade 2. Both are ER,PR positive. HER2 equivocal, FISH pending. 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  3 05/14/2017 MRI breast showed Two sites of biopsy proven malignancy seen in the left breast, which span up to 11.3 cm. There are intervening areas of non mass enhancement and a small suspicious enhancing mass. 4 Case was discussed on breast tumor board on 05/21/2017 and the panel agree with neoadjuvant ddAC to T followed by surgery. Clinically cT3N1M0  #Cancer treatment  patient completed s/p 4 cycles ddAC and weekly taxol x 12,  on 10/26/2017 patient underwent that surgery.  Left axillary sentinel lymph node was positive, so Dr. Tamala Julian proceeded with left mastectomy and left axillary lymph node dissection. Pathology showed ypT2(m) pN1a, ER/PR positive, HER2 FISH negative, grade 2, negative margin, invasive mammary carcinoma.  S/p adjuvant RT.  Declined NATALEE Trial Clinical trial for pabociclib and letrozole.    May 2019 Started on Letrozole   # Genetic testing Testing did not reveal a pathogenic mutation in any of the genes analyzed. A copy of the genetic test report will be scanned into Epic under the Media tab.The genes analyzed were the 83 genes on [facility]'s Multi-Cancer panel (ALK, APC, ATM, AXIN2, BAP1, BARD1, BLM, BMPR1A, BRCA1, BRCA2, BRIP1, CASR, CDC73, CDH1, CDK4, CDKN1B, CDKN1C, CDKN2A, CEBPA, CHEK2, CTNNA1, DICER1, DIS3L2, EGFR, EPCAM, FH, FLCN, GATA2, GPC3, GREM1, HOXB13, HRAS, KIT, MAX, MEN1, MET, MITF, MLH1, MSH2, MSH3, MSH6, MUTYH, NBN, NF1, NF2, NTHL1, PALB2, PDGFRA, PHOX2B, PMS2, POLD1, POLE, POT1, PRKAR1A, PTCH1, PTEN, RAD50, RAD51C, RAD51D, RB1, RECQL4, RET, RUNX1, SDHA, SDHAF2, SDHB, SDHC, SDHD, SMAD4, SMARCA4, SMARCB1, SMARCE1, STK11, SUFU, TERC, TERT, TMEM127, TP53, TSC1, TSC2, VHL, WRN, WT1).    INTERVAL HISTORY Patient presents for follow up for history of breast cancer ypT2 yp N1, s/p neoadjuvant chemotherapy and surgery.   # reports feeling well. Tolerates Letrozole with manageable hot flush.  Denies any new bone pain.  Denies any concerns of right breast and left mastectomy site.  Medi port has been flushed every 6- 8 weeks.  Patient desires to have medi port removed.   Review of Systems  Constitutional: Negative for appetite change, chills, fatigue  and fever.  HENT:   Negative for hearing loss and voice change.   Eyes: Negative for eye problems.  Respiratory: Negative for chest tightness and cough.   Cardiovascular: Negative for chest pain.  Gastrointestinal: Negative for abdominal  distention, abdominal pain and blood in stool.  Endocrine: Positive for hot flashes.  Genitourinary: Negative for difficulty urinating and frequency.   Musculoskeletal: Negative for arthralgias.  Skin: Negative for itching and rash.  Neurological: Negative for extremity weakness.  Hematological: Negative for adenopathy.  Psychiatric/Behavioral: Negative for confusion.    MEDICAL HISTORY: Past Medical History:  Diagnosis Date  . Anemia   . Arthritis    HIP-LEFT  . Benign neoplasm of colon   . Breast cancer ([place])   . Cancer ([place])   . Diabetes mellitus without complication ([place])   . Genetic testing 01/10/2018   Multi-Cancer panel (83 genes) @ [facility] - No pathogenic mutations detected  . H/O osteopenia   . History of hiatal hernia   . Hypertension   . Sleep apnea    USE CPAP  . Uterine cancer [facility])     SURGICAL HISTORY: Past Surgical History:  Procedure Laterality Date  . ABDOMINAL HYSTERECTOMY    . BREAST BIOPSY Left 04/2014   negative  . BREAST EXCISIONAL BIOPSY Left 2015  . CESAREAN SECTION     X2  . COLONOSCOPY WITH PROPOFOL N/A 04/15/2015   Procedure: COLONOSCOPY WITH PROPOFOL;  Surgeon: Hulen Luster, MD;  Location: [facility] ENDOSCOPY;  Service: Gastroenterology;  Laterality: N/A;  . HERNIA REPAIR    . MASTECTOMY Left 2018  . MASTECTOMY MODIFIED RADICAL Left 10/26/2017   Procedure: MASTECTOMY MODIFIED RADICAL;  Surgeon: Leonie Green, MD;  Location: [facility] ORS;  Service: General;  Laterality: Left;  [name] MASTECTOMY W/ SENTINEL NODE BIOPSY Left 10/26/2017   Procedure: MASTECTOMY WITH SENTINEL LYMPH NODE BIOPSY;  Surgeon: Leonie Green, MD;  Location: [facility] ORS;  Service: General;  Laterality: Left;  . PORTACATH PLACEMENT Right 05/22/2017   Procedure: INSERTION PORT-A-CATH;  Surgeon: Leonie Green, MD;  Location: [facility] ORS;  Service: General;  Laterality: Right;    SOCIAL HISTORY: Social History   Socioeconomic History  . Marital status: Married    Spouse  name: Not on file  . Number of children: Not on file  . Years of education: Not on file  . Highest education level: Not on file  Occupational History  . Not on file  Social Needs  . Financial resource strain: Not on file  . Food insecurity:    Worry: Not on file    Inability: Not on file  . Transportation needs:    Medical: Not on file    Non-medical: Not on file  Tobacco Use  . Smoking status: Never Smoker  . Smokeless tobacco: Never Used  Substance and Sexual Activity  . Alcohol use: No  . Drug use: No  . Sexual activity: Not on file  Lifestyle  . Physical activity:    Days per week: Not on file    Minutes per session: Not on file  . Stress: Not on file  Relationships  . Social connections:    Talks on phone: Not on file    Gets together: Not on file    Attends religious service: Not on file    Active member of club or organization: Not on file    Attends meetings of clubs or organizations: Not on file    Relationship status: Not on file  . Intimate partner  violence:    Fear of current or ex partner: Not on file    Emotionally abused: Not on file    Physically abused: Not on file    Forced sexual activity: Not on file  Other Topics Concern  . Not on file  Social History Narrative  . Not on file    FAMILY HISTORY Family History  Problem Relation Age of Onset  . Diabetes Mother   . Diabetes Father   . Melanoma Father 89       on finger  . Diabetes Sister   . Pancreatic cancer Paternal Grandfather        deceased 70s  . Breast cancer Other 80       paternal grandmother's mother    ALLERGIES:  has No Known Allergies.  MEDICATIONS:  Current Outpatient Medications  Medication Sig Dispense Refill  . Calcium Carb-Cholecalciferol (CALCIUM 600-D PO) Take 1 tablet by mouth daily.    . chlorhexidine (PERIDEX) 0.12 % solution USE AS DIRECTED 15 ML IN THE MOUTH OR THROAT TWICE A DAY (Patient not taking: Reported on 06/14/2018) 473 mL 0  . Cinnamon 500 MG capsule  Take 1,000 mg by mouth daily.     . cyanocobalamin 1000 MCG tablet Take 1,000 mcg by mouth daily.    . diphenhydrAMINE (BENADRYL) 25 MG tablet Take 25 mg by mouth every 6 (six) hours as needed for allergies.     [name] FIBER PO Take 2 each by mouth daily.     . fluticasone (FLONASE) 50 MCG/ACT nasal spray Place 2 sprays into both nostrils daily as needed for allergies.     [name] GLUCOSAMINE-CHONDROITIN PO Take 1 tablet by mouth daily.    [name] HYDROcodone-acetaminophen (NORCO/VICODIN) 5-325 MG tablet Take 1-2 tablets by mouth every 6 (six) hours as needed for moderate pain. (Patient not taking: Reported on 08/29/2018) 16 tablet 0  . letrozole (FEMARA) 2.5 MG tablet Take 1 tablet (2.5 mg total) by mouth daily. 90 tablet 3  . lidocaine-prilocaine (EMLA) cream Apply 1 application topically as needed. Apply small amount to port site at least 1 hour prior to it being accessed, cover with plastic wrap 30 g 3  . lisinopril-hydrochlorothiazide (PRINZIDE,ZESTORETIC) 20-12.5 MG per tablet Take 1 tablet by mouth daily.    [name] loperamide (IMODIUM) 2 MG capsule Take 1 capsule (2 mg total) by mouth See admin instructions. With onset of loose stool, take '4mg'$  followed by '2mg'$  every 2 hours until 12 hours have passed without loose bowel movement. Maximum: 16 mg/day (Patient not taking: Reported on 08/29/2018) 120 capsule 1  . metFORMIN (GLUCOPHAGE) 500 MG tablet Take 1,000 mg by mouth 2 (two) times daily.     . Multiple Vitamin (MULTIVITAMIN) tablet Take 1 tablet by mouth daily.    . ondansetron (ZOFRAN) 8 MG tablet Take 1 tablet (8 mg total) by mouth every 8 (eight) hours as needed for nausea or vomiting. 120 tablet 0  . pioglitazone (ACTOS) 15 MG tablet Take 30 mg by mouth daily.   1  . pyridOXINE (VITAMIN B-6) 100 MG tablet Take 1 tablet (100 mg total) by mouth daily. 90 tablet 3  . triamcinolone cream (KENALOG) 0.1 % Apply 1 application topically daily as needed (for rash).    . valACYclovir (VALTREX) 1000 MG tablet Take 1  tablet (1,000 mg total) by mouth 2 (two) times daily. (Patient not taking: Reported on 06/14/2018) 14 tablet 0   No current facility-administered medications for this visit.     PHYSICAL EXAMINATION:  ECOG PERFORMANCE STATUS: 1 - Symptomatic but completely ambulatory  Vitals:   09/13/18 1345  BP: 118/72  Pulse: 84  Resp: 18  Temp: 98 F (36.7 C)    Filed Weights   09/13/18 1345  Weight: 207 lb 14.4 oz (94.3 kg)     Physical Exam  Constitutional: She is oriented to person, place, and time and well-developed, well-nourished, and in no distress. No distress.  HENT:  Head: Normocephalic and atraumatic.  Right Ear: External ear normal.  Left Ear: External ear normal.  Nose: Nose normal.  Mouth/Throat: Oropharynx is clear and moist. No oropharyngeal exudate.  Eyes: Pupils are equal, round, and reactive to light. Conjunctivae and EOM are normal. Right eye exhibits no discharge. Left eye exhibits no discharge. No scleral icterus.  Neck: Normal range of motion. Neck supple. No JVD present.  Cardiovascular: Normal rate, regular rhythm and normal heart sounds.  No murmur heard. Pulmonary/Chest: Effort normal and breath sounds normal. No respiratory distress. She has no wheezes. She has no rales. She exhibits no tenderness.  Abdominal: Soft. Bowel sounds are normal. She exhibits no distension and no mass. There is no tenderness. There is no rebound and no guarding.  Musculoskeletal: Normal range of motion. She exhibits no edema or tenderness.  Lymphadenopathy:    She has no cervical adenopathy.  Neurological: She is alert and oriented to person, place, and time. No cranial nerve deficit. She exhibits normal muscle tone. Coordination normal.  Skin: Skin is warm and dry. No rash noted. She is not diaphoretic. No erythema.  Psychiatric: Affect and judgment normal.  Breast exam is performed in seating position. Patient is status post left mastectomy. There is no evidence of any chest wall  recurrence. No evidence of bilateral axillary adenopathy. No palpable mass of right breast.   ECOG 0  LABORATORY DATA: CBC Latest Ref Rng & Units 06/14/2018 03/12/2018 01/30/2018  WBC 3.6 - 11.0 K/uL 8.7 7.6 8.7  Hemoglobin 12.0 - 16.0 g/dL 12.0 12.3 11.9(L)  Hematocrit 35.0 - 47.0 % 35.0 35.1 34.8(L)  Platelets 150 - 440 K/uL 276 290 283   CMP Latest Ref Rng & Units 06/14/2018 03/12/2018 12/31/2017  Glucose 70 - 99 mg/dL 190(H) 270(H) 168(H)  BUN 8 - 23 mg/dL _0 Creatinine 0.44 - 1.00 mg/dL 0.75 0.73 0.67  Sodium 135 - 145 mmol/L 136 137 132(L)  Potassium 3.5 - 5.1 mmol/L 4.0 4.0 3.8  Chloride 98 - 111 mmol/L 100 103 98(L)  CO2 22 - 32 mmol/L 26 25 21(L)  Calcium 8.9 - 10.3 mg/dL 9.9 9.8 9.9  Total Protein 6.5 - 8.1 g/dL 7.0 6.8 7.3  Total Bilirubin 0.3 - 1.2 mg/dL 0.6 0.4 0.5  Alkaline Phos 38 - 126 U/L 69 80 93  AST 15 - 41 U/L _1 ALT 0 - 44 U/L _2 PATHOLOGY 10/27/2017  Surgical Pathology  CASE: [id number]  PATIENT: [facility]  Surgical Pathology Report  SPECIMEN SUBMITTED:  A. Sentinel lymph node #1, left axilla  B. Sentinel lymph nodes #2 and 3, left axilla  C. Breast, left; stitch outer end of skin ellipse, and axillary lymph  nodes  DIAGNOSIS:  A. SENTINEL LYMPH NODE #1, LEFT AXILLA; EXCISION:  - METASTATIC MAMMARY CARCINOMA, ONE LYMPH NODE, 5 MM METASTASIS (1/1).   B. SENTINEL LYMPH NODES #2 AND #3, LEFT AXILLA; EXCISION:  - NEGATIVE FOR MALIGNANCY, THREE LYMPH NODES (0/3).   C. BREAST, LEFT; MODIFIED RADICAL MASTECTOMY:  - INVASIVE  MAMMARY CARCINOMA, TWO SITES, SEE SUMMARY BELOW.  - BIOPSY SITE CHANGES AT TWO SITES; ONE MARKER CLIP FOUND.  - METASTATIC MAMMARY CARCINOMA IN TWO OF FOUR AXILLARY LYMPH NODES, WITH  LARGER METASTASIS (8 MM) LOCATED IN NODE WITH MARKER CLIP (2/4).   Comment:  ER, PR, and HER2 were assessed on the previous core biopsies  ([id number], 04/25/2017) with results summarized as follows:  ER IHC (SP1): Both  tumors positive, >90%, strong staining  PR IHC (IE2): Both tumors positive, 1:00 tumor 51-90%, mod to strong,  and 4:00 tumor >90% with strong staining  HER2 IHC (HercepTest): Both tumors equivocal, score 2+  HER2 FISH (DAKO IQFISH PharmDX): Both tumors negative (not amplified)   Surgical Pathology Cancer Case Summary  INVASIVE CARCINOMA OF THE BREAST:  Procedure: Total mastectomy  Specimen Laterality: Left  Histologic Type: Invasive carcinoma of no special type  Histologic Grade ([place] Histologic Score)       Glandular (Acinar)/Tubular Differentiation: Score 3       Nuclear Pleomorphism: Score 2       Mitotic Rate: Score 1       Overall Grade: Grade 2  Tumor Size: 23 mm (4-5:00 posterior tumor); 12 mm (1:00 tumor)  Ductal Carcinoma In Situ (DCIS): Present  DCIS Nuclear Grade: Intermediate grade  Extensive intraductal component: Negative for extensive intraductal  component  Margins:       Invasive carcinoma margins: Uninvolved by invasive carcinoma       Distance from closest margin: 0.2 mm, deep       DCIS margins: Uninvolved by DCIS       Distance from closest margin: Cannot be determined; margins  not present in sections containing DCIS  Regional Lymph nodes:    Total # lymph nodes examined: 8    # Sentinel lymph nodes examined: 4    # Lymph nodes with macrometastasis (>2.0 mm): 2    # Lymph nodes with isolated tumor cells (<0.2 mm): 0    # Lymph nodes with micrometastasis (> 0.2 mm and < 2.0 mm): 1    Extranodal extension: Present  Treatment Effect:       In the breast: Definite response to presurgical therapy in the invasive carcinoma       In the lymph nodes: No definite response to presurgical therapy in metastatic carcinoma  Residual Cancer Burden (RCB):    Primary Tumor Bed  Primary tumor bed:23 mm x 11 mm    Overall cancer cellularity: 0.5%    Percentage of cancer that is in situ:  0%     Lymph nodes    # of lymph nodes positive for metastasis: 3    Diameter of largest metastasis: 8 mm    Residual Cancer Burden (RCB): 2.553       Residual Cancer Burden Class: RCB-II  Lymphovascular Invasion: Cannot be determined with extensive retraction  changes  Pathologic Stage Classification (pTNM, AJCC 8th Edition): ypT2(m) pN1a  TNM Descriptors: y (posttreatment); m (multiple foci of invasive  carcinoma)   RADIOGRAPHIC STUDIES: I have personally reviewed the radiological images as listed and agreed with the findings in the report. # 05/02/2018 Unilateral right breast diagnostic mammogram and ultrasound were obtained and was negative.   ASSESSMENT/PLAN 67 y.o. female presents with cT3cN1cMo 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  S.p post left modified radical mastectomy with ALND, s/p radiation.   1. Malignant neoplasm of  lower-outer quadrant of left breast of female, estrogen receptor positive ([place])   2. Port-A-Cath in place   3. History of uterine cancer   4. Osteopenia, unspecified location    # ypT2N1a M0 disease, ER+PR+, HER2-. Residual disease after neoadjuvant chemotherapy s/p RT/  Tolerates letrozole. Continue.  Recommend Unilateral right screening mammogram in July 2020. Patient gets mammogram through GYN office.   # Osteopenia, on Zometa Q 48m Proceed one dose today.  Continue over the counter calcium and vitamin D supplements.  # Hot flush, due to letrozole. Manageble. Monitor.  # Port a cath in place, no clinical signs of recurrence. Patient desires removing port[place]   We spent sufficient time to discuss many aspect of care, questions were answered to patient's satisfaction. Total face to face encounter time for this patient visit was 25 min. >50% of the time was  spent in  counseling and coordination of care.     ZEarlie Server MD, PhD Hematology Oncology CSentara Princess Anne Hospitalat APlastic And Reconstructive SurgeonsPager- [phone]/03/2018

## 2018-09-13 NOTE — Progress Notes (Signed)
Pt here for follow up. No concerns voiced.  

## 2018-09-20 ENCOUNTER — Inpatient Hospital Stay: Payer: Medicare HMO

## 2018-09-29 IMAGING — MG 2D DIGITAL DIAGNOSTIC UNILATERAL LEFT MAMMOGRAM WITH CAD AND ADJ
6 series · 6 of 14 positions shown · non-contrast
Comparison: Previous exam(s).

CLINICAL DATA: Callback from screening mammogram

EXAM:
2D DIGITAL DIAGNOSTIC LEFT MAMMOGRAM WITH CAD AND ADJUNCT TOMO
ULTRASOUND LEFT BREAST

[L CC]
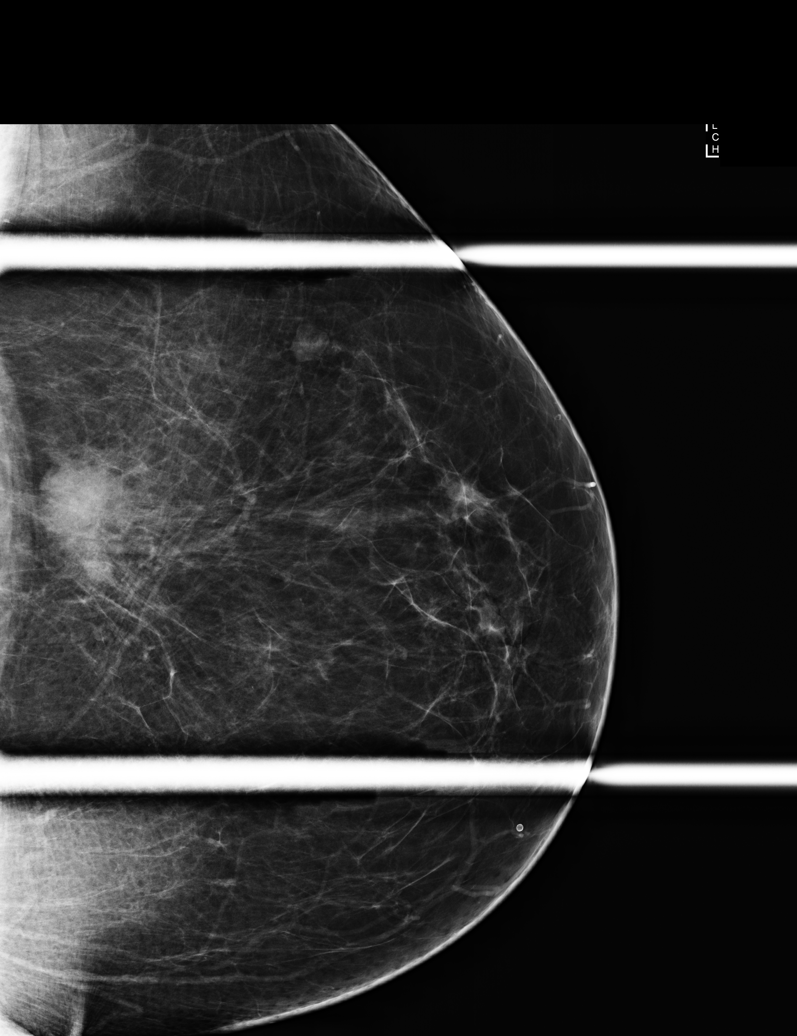

[L MLO synth-2D]
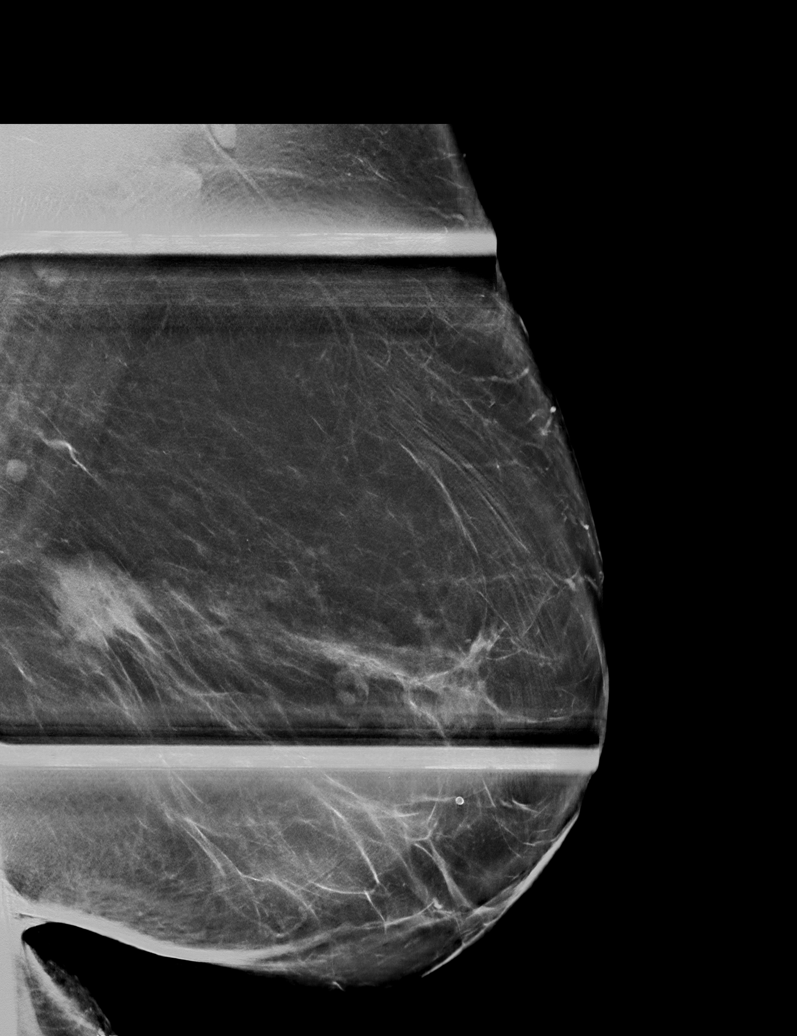

[L MLO]
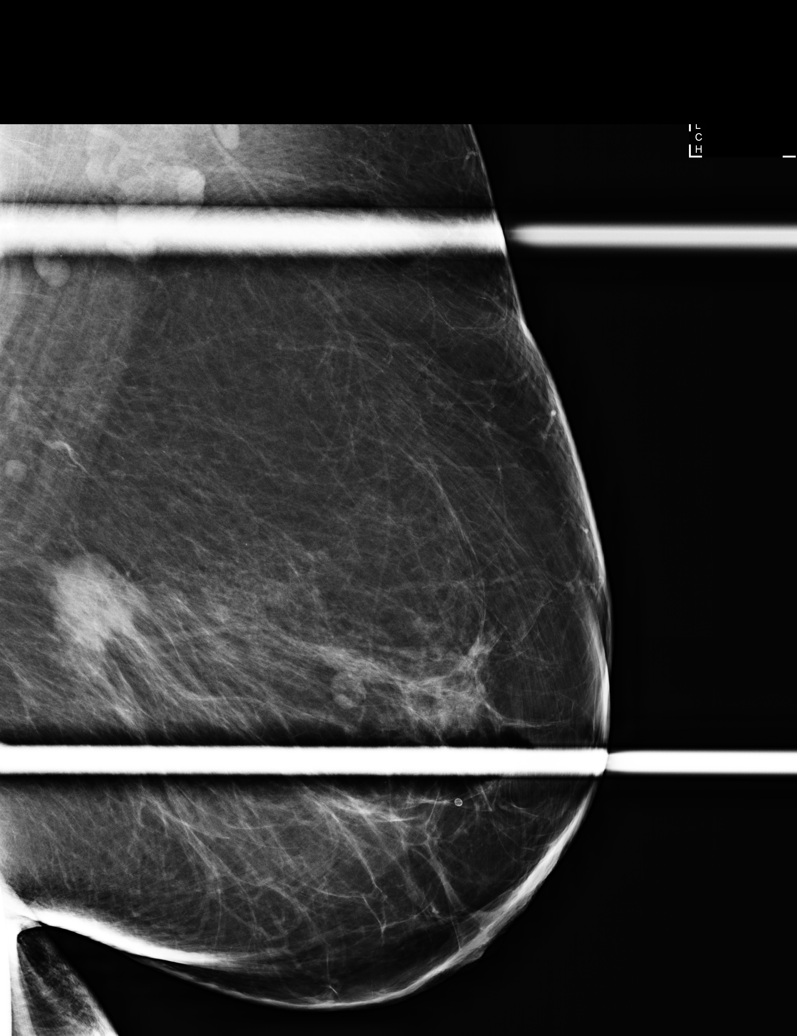

[L CC synth-2D]
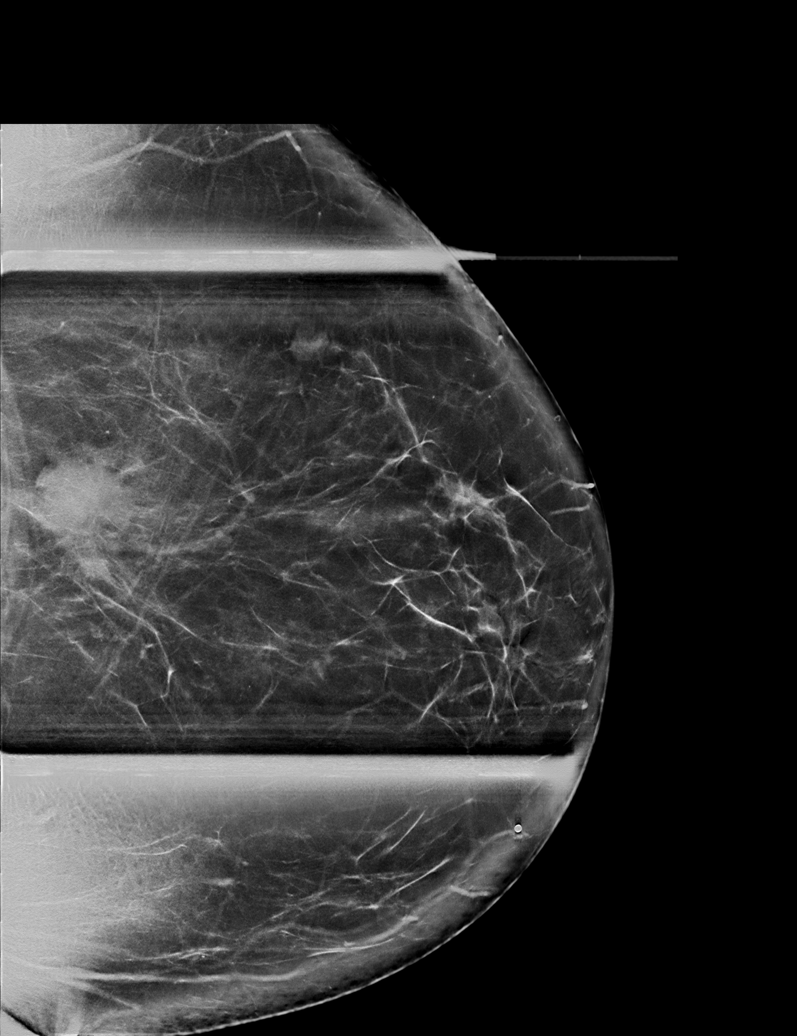

[L MLO tomo · tomo slice 47/92.0]
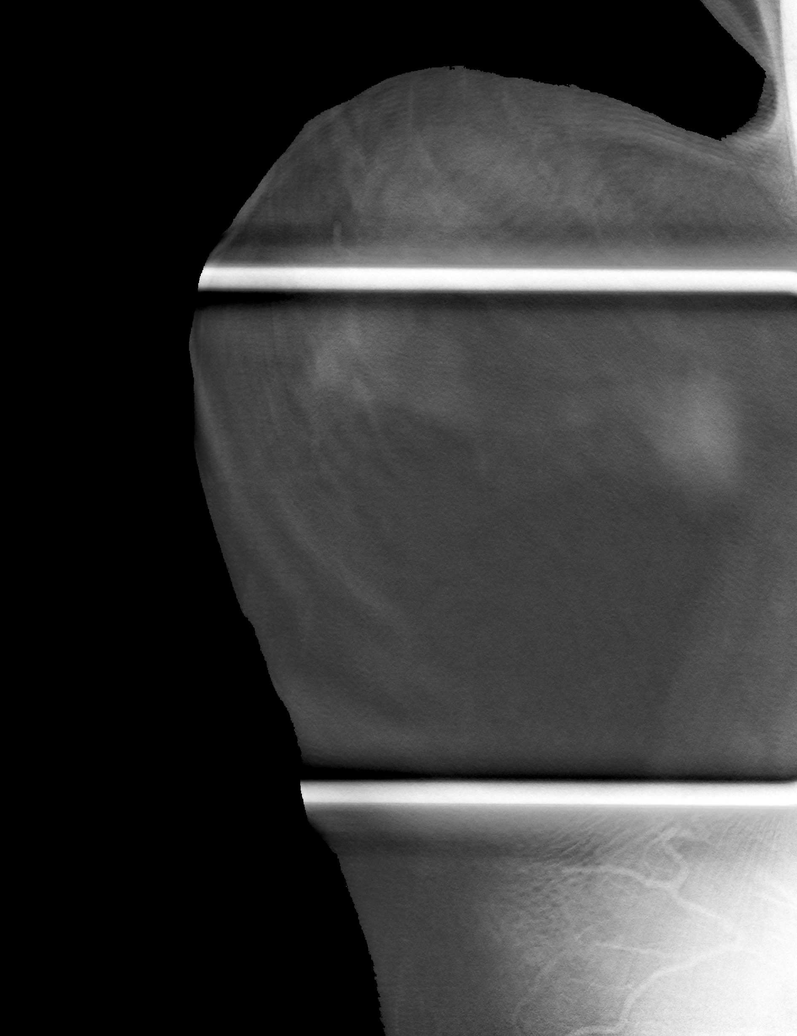

[L CC tomo · tomo slice 42/83.0]
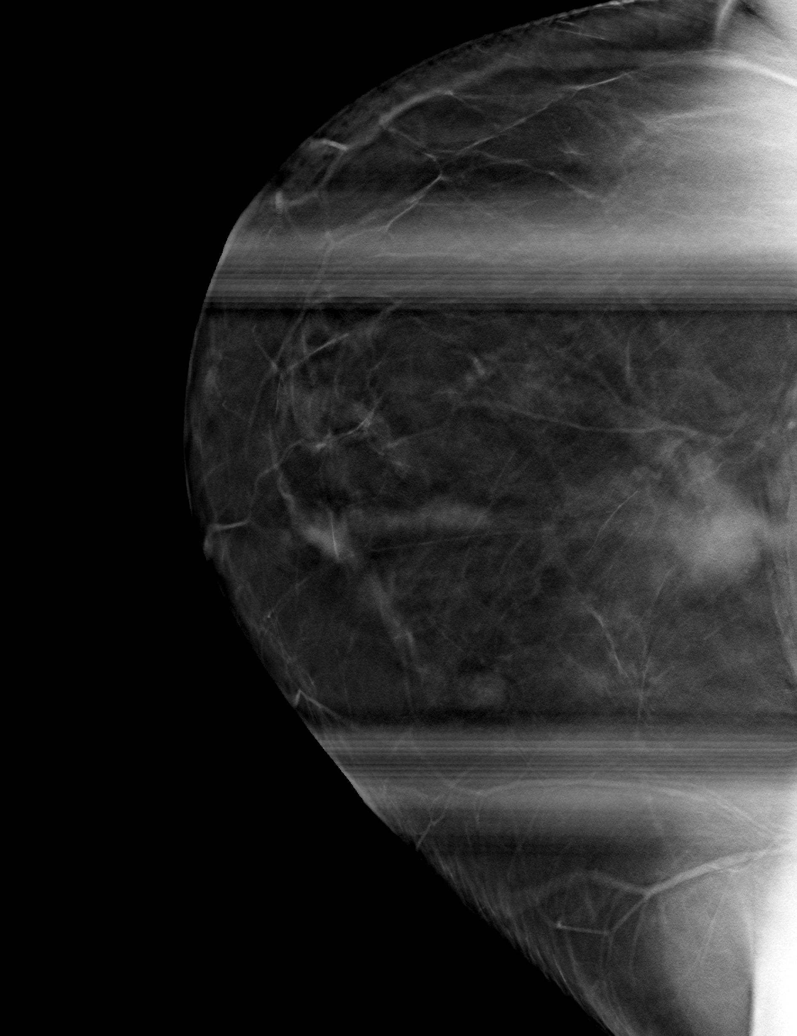

[6 of 14 positions shown; findings below may reference images not displayed]

ACR Breast Density Category b: There are scattered areas of
fibroglandular density.
FINDINGS: There is an irregular spiculated mass in the slightly lower, outer
left breast, posterior depth measuring approximately 2.6 x 2.5 x
cm. There is an additional irregular mass within the upper, outer
left breast, anterior depth measuring approximately 1 cm in size,
which is suspicious for a satellite lesion. There is asymmetric
density between the 2 masses suggesting contiguous spread of
disease. Mammographically, the masses are separated by approximately
6 cm and in total span approximately 9.6 cm.

Mammographic images were processed with CAD.

Targeted ultrasound of the left breast demonstrates an irregular,
hypoechoic mass at 1 o'clock, 3 cm from the nipple measuring 10 x 10
x 10 mm corresponding to the mass seen within the anterior upper,
outer left breast. At 4 o'clock, 7 cm from the nipple, there is an
irregular hypoechoic mass measuring 2.1 x 1.5 x 1.9 cm. Targeted
ultrasound of the left axilla demonstrates an enlarged, abnormal
axillary lymph node demonstrating cortical thickening up to 6 mm.
The masses at 1 and 4 o'clock are separated by approximately 4 cm
sonographically.
IMPRESSION: Highly suspicious left breast masses at 4 o'clock, 7 cm from the
nipple and 1 o'clock, 3 cm from the nipple. Enlarged left axillary
lymph node suspicious for metastatic disease.

RECOMMENDATION:
Ultrasound-guided biopsies of the left breast mass at 4 o'clock, the
left breast mass at 1 o'clock, and of the enlarged left axillary
lymph node.

I have discussed the findings and recommendations with the patient.
Results were also provided in writing at the conclusion of the
visit. If applicable, a reminder letter will be sent to the patient
regarding the next appointment.

BI-RADS CATEGORY  5: Highly suggestive of malignancy.

## 2019-01-14 ENCOUNTER — Other Ambulatory Visit: Payer: Self-pay

## 2019-01-15 ENCOUNTER — Other Ambulatory Visit: Payer: Self-pay

## 2019-01-15 ENCOUNTER — Inpatient Hospital Stay: Payer: Medicare HMO | Attending: Oncology

## 2019-01-15 ENCOUNTER — Inpatient Hospital Stay (HOSPITAL_BASED_OUTPATIENT_CLINIC_OR_DEPARTMENT_OTHER): Payer: Medicare HMO | Admitting: Oncology

## 2019-01-15 ENCOUNTER — Inpatient Hospital Stay: Payer: Medicare HMO

## 2019-01-15 ENCOUNTER — Encounter: Payer: Self-pay | Admitting: Oncology

## 2019-01-15 ENCOUNTER — Inpatient Hospital Stay: Payer: Medicare HMO | Admitting: Oncology

## 2019-01-15 VITALS — BP 126/80 | HR 80 | Temp 99.7°F | Resp 18 | Wt 218.1 lb

## 2019-01-15 DIAGNOSIS — Z9221 Personal history of antineoplastic chemotherapy: Secondary | ICD-10-CM

## 2019-01-15 DIAGNOSIS — Z7983 Long term (current) use of bisphosphonates: Secondary | ICD-10-CM | POA: Insufficient documentation

## 2019-01-15 DIAGNOSIS — Z170 Estrogen receptor positive status [ER+]: Secondary | ICD-10-CM

## 2019-01-15 DIAGNOSIS — C50512 Malignant neoplasm of lower-outer quadrant of left female breast: Secondary | ICD-10-CM

## 2019-01-15 DIAGNOSIS — Z79811 Long term (current) use of aromatase inhibitors: Secondary | ICD-10-CM

## 2019-01-15 DIAGNOSIS — C50412 Malignant neoplasm of upper-outer quadrant of left female breast: Secondary | ICD-10-CM | POA: Diagnosis present

## 2019-01-15 DIAGNOSIS — Z923 Personal history of irradiation: Secondary | ICD-10-CM | POA: Diagnosis not present

## 2019-01-15 DIAGNOSIS — M8580 Other specified disorders of bone density and structure, unspecified site: Secondary | ICD-10-CM | POA: Diagnosis not present

## 2019-01-15 LAB — COMPREHENSIVE METABOLIC PANEL
ALT: 14 U/L (ref 0–44)
AST: 18 U/L (ref 15–41)
Albumin: 3.8 g/dL (ref 3.5–5.0)
Alkaline Phosphatase: 83 U/L (ref 38–126)
Anion gap: 6 (ref 5–15)
BUN: 20 mg/dL (ref 8–23)
CO2: 29 mmol/L (ref 22–32)
Calcium: 9.7 mg/dL (ref 8.9–10.3)
Chloride: 101 mmol/L (ref 98–111)
Creatinine, Ser: 0.81 mg/dL (ref 0.44–1.00)
GFR calc Af Amer: >60 mL/min (ref >60)
GFR calc non Af Amer: >60 mL/min (ref >60)
Glucose, Bld: 200 mg/dL — ABNORMAL HIGH (ref 70–99)
Potassium: 4.4 mmol/L (ref 3.5–5.1)
Sodium: 136 mmol/L (ref 135–145)
Total Bilirubin: 0.4 mg/dL (ref 0.3–1.2)
Total Protein: 7 g/dL (ref 6.5–8.1)

## 2019-01-15 LAB — CBC WITH DIFFERENTIAL/PLATELET
Abs Immature Granulocytes: 0.1 10*3/uL — ABNORMAL HIGH (ref 0.00–0.07)
Basophils Absolute: 0.1 10*3/uL (ref 0.0–0.1)
Basophils Relative: 1 %
Eosinophils Absolute: 0.5 10*3/uL (ref 0.0–0.5)
Eosinophils Relative: 6 %
HCT: 36.8 % (ref 36.0–46.0)
Hemoglobin: 12.3 g/dL (ref 12.0–15.0)
Immature Granulocytes: 1 %
Lymphocytes Relative: 15 %
Lymphs Abs: 1.2 10*3/uL (ref 0.7–4.0)
MCH: 28.8 pg (ref 26.0–34.0)
MCHC: 33.4 g/dL (ref 30.0–36.0)
MCV: 86.2 fL (ref 80.0–100.0)
Monocytes Absolute: 0.7 10*3/uL (ref 0.1–1.0)
Monocytes Relative: 8 %
Neutro Abs: 5.5 10*3/uL (ref 1.7–7.7)
Neutrophils Relative %: 69 %
Platelets: 267 10*3/uL (ref 150–400)
RBC: 4.27 MIL/uL (ref 3.87–5.11)
RDW: 13.2 % (ref 11.5–15.5)
WBC: 8 10*3/uL (ref 4.0–10.5)
nRBC: 0 % (ref 0.0–0.2)

## 2019-01-15 NOTE — Progress Notes (Signed)
Patient here for follow up. No side effects from letrozole. Temp is 99.7, she states she has been checking temp at home and it has been below 98. Denies chills, pain or cough.

## 2019-01-16 NOTE — Progress Notes (Signed)
[facility] Follow Up Visit.  Patient Care Team: Glendon Axe, MD as PCP - General (Internal Medicine) Rico Junker, RN as Oncology Nurse Navigator Earlie Server, MD as Consulting Physician (Oncology) Leonie Green, MD as Referring Physician (Surgery) Noreene Filbert, MD as Referring Physician (Radiation Oncology)  Reason for visit Follow up for management of Stage IIA left breast cancer.   Pertinent Oncology History Dominique Guzman 68 y.o. female with PMH listed as below is referred by Dr.Schermerhorn to [name] for evaluation and management of newly diagnosed breast cancer. Patient had a history a left breast mass biopsy followed by breast excisonal biopsy in 2015 with benign pathology.  She also has a history of Uterine cancer which was treated with RT with Dr.Chrystal.  She underwent routine screening mammogram on 04/17/2017 which recommended a possible mass on the left breast. Left diagnostic mammogram showed a highly suspicious left breast mass at 4 o'clock, 7 cm from the nipple and 1 o'clock,,  About 3 cm from the nipple. Enlarged left axillary lymph node suspicious for metastatic disease.  1 US breast confirmed a left breast mass of 2.1cm, 4 o'clock, and 1.1 cm mass at 1 o'clock.The masses at 1 and 4 o'clock are separated by approximately 4 cm sonographically 2 Biopsy of the two mass and axillary lymph node revealed invasive carcinoma, grade 2. Both are ER,PR positive. HER2 equivocal, FISH pending. 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  3 05/14/2017 MRI breast showed Two sites of biopsy proven malignancy seen in the left breast, which span up to 11.3 cm. There are intervening areas of non mass enhancement and a small suspicious enhancing mass. 4 Case was discussed on breast tumor board on 05/21/2017 and the panel agree with neoadjuvant ddAC to T followed by surgery. Clinically cT3N1M0, Stage IIA  # Genetic testing Testing did not reveal a  pathogenic mutation in any of the genes analyzed. A copy of the genetic test report will be scanned into Epic under the Media tab.The genes analyzed were the 83 genes on [facility]'s Multi-Cancer panel (ALK, APC, ATM, AXIN2, BAP1, BARD1, BLM, BMPR1A, BRCA1, BRCA2, BRIP1, CASR, CDC73, CDH1, CDK4, CDKN1B, CDKN1C, CDKN2A, CEBPA, CHEK2, CTNNA1, DICER1, DIS3L2, EGFR, EPCAM, FH, FLCN, GATA2, GPC3, GREM1, HOXB13, HRAS, KIT, MAX, MEN1, MET, MITF, MLH1, MSH2, MSH3, MSH6, MUTYH, NBN, NF1, NF2, NTHL1, PALB2, PDGFRA, PHOX2B, PMS2, POLD1, POLE, POT1, PRKAR1A, PTCH1, PTEN, RAD50, RAD51C, RAD51D, RB1, RECQL4, RET, RUNX1, SDHA, SDHAF2, SDHB, SDHC, SDHD, SMAD4, SMARCA4, SMARCB1, SMARCE1, STK11, SUFU, TERC, TERT, TMEM127, TP53, TSC1, TSC2, VHL, WRN, WT1).   #Cancer treatment  Neoadjuvant chemotherapy 05/23/2017- 10/03/2017 patient completed s/p 4 cycles ddAC and weekly taxol x 12, on 10/26/2017 patient underwent that surgery.  Left axillary sentinel lymph node was positive, so Dr. Tamala Julian proceeded with left mastectomy and left axillary lymph node dissection. Pathology showed ypT2(m) pN1a, ER/PR positive, HER2 FISH negative, grade 2, negative margin, invasive mammary carcinoma.  S/p adjuvant RT.  Declined NATALEE Trial Clinical trial for pabociclib and letrozole.   # May 2019 Started on Letrozole     INTERVAL HISTORY Patient presents for follow up for history of breast cancer ypT2 yp N1, s/p neoadjuvant chemotherapy and surgery ad adjuvant RT.  Today she presents for 4 months follow up visit.  Since last visit, she has had her medi port removed.  Denies any concerns of her right breast and left mastectomy site.  Denies any new bone pain, fever, chills, sore throat, weight loss, abdominal pain.  She tolerates Letrozol well, manageable vasomotor symptoms.   Review of Systems  Constitutional: Negative for appetite change, chills, fatigue and fever.  HENT:   Negative for hearing loss and voice change.   Eyes:  Negative for eye problems.  Respiratory: Negative for chest tightness and cough.   Cardiovascular: Negative for chest pain.  Gastrointestinal: Negative for abdominal distention, abdominal pain and blood in stool.  Endocrine: Positive for hot flashes.  Genitourinary: Negative for difficulty urinating and frequency.   Musculoskeletal: Negative for arthralgias.  Skin: Negative for itching and rash.  Neurological: Negative for extremity weakness.  Hematological: Negative for adenopathy.  Psychiatric/Behavioral: Negative for confusion.    MEDICAL HISTORY: Past Medical History:  Diagnosis Date  . Anemia   . Arthritis    HIP-LEFT  . Benign neoplasm of colon   . Breast cancer ([place])   . Cancer ([place])   . Diabetes mellitus without complication ([place])   . Genetic testing 01/10/2018   Multi-Cancer panel (83 genes) @ [facility] - No pathogenic mutations detected  . H/O osteopenia   . History of hiatal hernia   . Hypertension   . Sleep apnea    USE CPAP  . Uterine cancer [facility])     SURGICAL HISTORY: Past Surgical History:  Procedure Laterality Date  . ABDOMINAL HYSTERECTOMY    . BREAST BIOPSY Left 04/2014   negative  . BREAST EXCISIONAL BIOPSY Left 2015  . CESAREAN SECTION     X2  . COLONOSCOPY WITH PROPOFOL N/A 04/15/2015   Procedure: COLONOSCOPY WITH PROPOFOL;  Surgeon: Hulen Luster, MD;  Location: [facility] ENDOSCOPY;  Service: Gastroenterology;  Laterality: N/A;  . HERNIA REPAIR    . MASTECTOMY Left 2018  . MASTECTOMY MODIFIED RADICAL Left 10/26/2017   Procedure: MASTECTOMY MODIFIED RADICAL;  Surgeon: Leonie Green, MD;  Location: [facility] ORS;  Service: General;  Laterality: Left;  [name] MASTECTOMY W/ SENTINEL NODE BIOPSY Left 10/26/2017   Procedure: MASTECTOMY WITH SENTINEL LYMPH NODE BIOPSY;  Surgeon: Leonie Green, MD;  Location: [facility] ORS;  Service: General;  Laterality: Left;  . PORTACATH PLACEMENT Right 05/22/2017   Procedure: INSERTION PORT-A-CATH;  Surgeon: Leonie Green,  MD;  Location: [facility] ORS;  Service: General;  Laterality: Right;    SOCIAL HISTORY: Social History   Socioeconomic History  . Marital status: Married    Spouse name: Not on file  . Number of children: Not on file  . Years of education: Not on file  . Highest education level: Not on file  Occupational History  . Not on file  Social Needs  . Financial resource strain: Not on file  . Food insecurity:    Worry: Not on file    Inability: Not on file  . Transportation needs:    Medical: Not on file    Non-medical: Not on file  Tobacco Use  . Smoking status: Never Smoker  . Smokeless tobacco: Never Used  Substance and Sexual Activity  . Alcohol use: No  . Drug use: No  . Sexual activity: Not on file  Lifestyle  . Physical activity:    Days per week: Not on file    Minutes per session: Not on file  . Stress: Not on file  Relationships  . Social connections:    Talks on phone: Not on file    Gets together: Not on file    Attends religious service: Not on file    Active member of club or organization: Not on file    Attends  meetings of clubs or organizations: Not on file    Relationship status: Not on file  . Intimate partner violence:    Fear of current or ex partner: Not on file    Emotionally abused: Not on file    Physically abused: Not on file    Forced sexual activity: Not on file  Other Topics Concern  . Not on file  Social History Narrative  . Not on file    FAMILY HISTORY Family History  Problem Relation Age of Onset  . Diabetes Mother   . Diabetes Father   . Melanoma Father 27       on finger  . Diabetes Sister   . Pancreatic cancer Paternal Grandfather        deceased 65s  . Breast cancer Other 30       paternal grandmother's mother    ALLERGIES:  has No Known Allergies.  MEDICATIONS:  Current Outpatient Medications  Medication Sig Dispense Refill  . Calcium Carb-Cholecalciferol (CALCIUM 600-D PO) Take 1 tablet by mouth daily.    . Cinnamon 500  MG capsule Take 1,000 mg by mouth daily.     . cyanocobalamin 1000 MCG tablet Take 1,000 mcg by mouth daily.    . diphenhydrAMINE (BENADRYL) 25 MG tablet Take 25 mg by mouth every 6 (six) hours as needed for allergies.     [name] FIBER PO Take 2 each by mouth daily.     [name] GLUCOSAMINE-CHONDROITIN PO Take 1 tablet by mouth daily.    [name] letrozole (FEMARA) 2.5 MG tablet Take 1 tablet (2.5 mg total) by mouth daily. 90 tablet 3  . lisinopril-hydrochlorothiazide (PRINZIDE,ZESTORETIC) 20-12.5 MG per tablet Take 1 tablet by mouth daily.    . metFORMIN (GLUCOPHAGE) 500 MG tablet Take 1,000 mg by mouth 2 (two) times daily.     . Multiple Vitamin (MULTIVITAMIN) tablet Take 1 tablet by mouth daily.    . pioglitazone (ACTOS) 15 MG tablet Take 30 mg by mouth daily.   1  . pyridOXINE (VITAMIN B-6) 100 MG tablet Take 1 tablet (100 mg total) by mouth daily. 90 tablet 3  . triamcinolone cream (KENALOG) 0.1 % Apply 1 application topically daily as needed (for rash).    [name] lidocaine-prilocaine (EMLA) cream Apply 1 application topically as needed. Apply small amount to port site at least 1 hour prior to it being accessed, cover with plastic wrap (Patient not taking: Reported on 01/15/2019) 30 g 3  . loperamide (IMODIUM) 2 MG capsule Take 1 capsule (2 mg total) by mouth See admin instructions. With onset of loose stool, take 45m followed by 276mevery 2 hours until 12 hours have passed without loose bowel movement. Maximum: 16 mg/day (Patient not taking: Reported on 08/29/2018) 120 capsule 1   No current facility-administered medications for this visit.     PHYSICAL EXAMINATION:  ECOG PERFORMANCE STATUS: 1 - Symptomatic but completely ambulatory  Vitals:   01/15/19 1042  BP: 126/80  Pulse: 80  Resp: 18  Temp: 99.7 F (37.6 C)    Filed Weights   01/15/19 1042  Weight: 218 lb 1.6 oz (98.9 kg)     Physical Exam  Constitutional: She is oriented to person, place, and time and well-developed, well-nourished, and in  no distress. No distress.  HENT:  Head: Normocephalic and atraumatic.  Right Ear: External ear normal.  Left Ear: External ear normal.  Nose: Nose normal.  Mouth/Throat: Oropharynx is clear and moist. No oropharyngeal exudate.  Eyes: Pupils are equal,  round, and reactive to light. Conjunctivae and EOM are normal. Right eye exhibits no discharge. Left eye exhibits no discharge. No scleral icterus.  Neck: Normal range of motion. Neck supple. No JVD present.  Cardiovascular: Normal rate, regular rhythm and normal heart sounds.  No murmur heard. Pulmonary/Chest: Effort normal and breath sounds normal. No respiratory distress. She has no wheezes. She has no rales. She exhibits no tenderness.  Abdominal: Soft. Bowel sounds are normal. She exhibits no distension and no mass. There is no abdominal tenderness. There is no rebound and no guarding.  Musculoskeletal: Normal range of motion.        General: No tenderness or edema.  Lymphadenopathy:    She has no cervical adenopathy.  Neurological: She is alert and oriented to person, place, and time. No cranial nerve deficit. She exhibits normal muscle tone. Coordination normal.  Skin: Skin is warm and dry. No rash noted. She is not diaphoretic. No erythema.  Psychiatric: Affect and judgment normal.  Breast exam is performed in seating position. Patient is status post left mastectomy. There is no evidence of any chest wall recurrence. No evidence of bilateral axillary adenopathy. No palpable mass of right breast.   ECOG 0  LABORATORY DATA: CBC Latest Ref Rng & Units 01/15/2019 09/13/2018 06/14/2018  WBC 4.0 - 10.5 K/uL 8.0 7.5 8.7  Hemoglobin 12.0 - 15.0 g/dL 12.3 11.8(L) 12.0  Hematocrit 36.0 - 46.0 % 36.8 36.2 35.0  Platelets 150 - 400 K/uL 267 297 276   CMP Latest Ref Rng & Units 01/15/2019 09/13/2018 06/14/2018  Glucose 70 - 99 mg/dL 200(H) 178(H) 190(H)  BUN 8 - 23 mg/dL 20 24(H) 21  Creatinine 0.44 - 1.00 mg/dL 0.81 0.75 0.75  Sodium 135 - 145  mmol/L 136 139 136  Potassium 3.5 - 5.1 mmol/L 4.4 3.9 4.0  Chloride 98 - 111 mmol/L 101 103 100  CO2 22 - 32 mmol/L _0 Calcium 8.9 - 10.3 mg/dL 9.7 9.6 9.9  Total Protein 6.5 - 8.1 g/dL 7.0 7.0 7.0  Total Bilirubin 0.3 - 1.2 mg/dL 0.4 0.4 0.6  Alkaline Phos 38 - 126 U/L 83 75 69  AST 15 - 41 U/L _1 ALT 0 - 44 U/L _2 PATHOLOGY 10/27/2017  Surgical Pathology  CASE: [id number]  PATIENT: [facility]  Surgical Pathology Report  SPECIMEN SUBMITTED:  A. Sentinel lymph node #1, left axilla  B. Sentinel lymph nodes #2 and 3, left axilla  C. Breast, left; stitch outer end of skin ellipse, and axillary lymph  nodes  DIAGNOSIS:  A. SENTINEL LYMPH NODE #1, LEFT AXILLA; EXCISION:  - METASTATIC MAMMARY CARCINOMA, ONE LYMPH NODE, 5 MM METASTASIS (1/1).   B. SENTINEL LYMPH NODES #2 AND #3, LEFT AXILLA; EXCISION:  - NEGATIVE FOR MALIGNANCY, THREE LYMPH NODES (0/3).   C. BREAST, LEFT; MODIFIED RADICAL MASTECTOMY:  - INVASIVE MAMMARY CARCINOMA, TWO SITES, SEE SUMMARY BELOW.  - BIOPSY SITE CHANGES AT TWO SITES; ONE MARKER CLIP FOUND.  - METASTATIC MAMMARY CARCINOMA IN TWO OF FOUR AXILLARY LYMPH NODES, WITH  LARGER METASTASIS (8 MM) LOCATED IN NODE WITH MARKER CLIP (2/4).   Comment:  ER, PR, and HER2 were assessed on the previous core biopsies  ([id number], 04/25/2017) with results summarized as follows:  ER IHC (SP1): Both tumors positive, >90%, strong staining  PR IHC (IE2): Both tumors positive, 1:00 tumor 51-90%, mod to strong,  and 4:00 tumor >90% with strong staining  HER2 IHC (HercepTest): Both  tumors equivocal, score 2+  HER2 FISH (DAKO IQFISH PharmDX): Both tumors negative (not amplified)   Surgical Pathology Cancer Case Summary  INVASIVE CARCINOMA OF THE BREAST:  Procedure: Total mastectomy  Specimen Laterality: Left  Histologic Type: Invasive carcinoma of no special type  Histologic Grade ([place] Histologic Score)       Glandular  (Acinar)/Tubular Differentiation: Score 3       Nuclear Pleomorphism: Score 2       Mitotic Rate: Score 1       Overall Grade: Grade 2  Tumor Size: 23 mm (4-5:00 posterior tumor); 12 mm (1:00 tumor)  Ductal Carcinoma In Situ (DCIS): Present  DCIS Nuclear Grade: Intermediate grade  Extensive intraductal component: Negative for extensive intraductal  component  Margins:       Invasive carcinoma margins: Uninvolved by invasive carcinoma       Distance from closest margin: 0.2 mm, deep       DCIS margins: Uninvolved by DCIS       Distance from closest margin: Cannot be determined; margins  not present in sections containing DCIS  Regional Lymph nodes:    Total # lymph nodes examined: 8    # Sentinel lymph nodes examined: 4    # Lymph nodes with macrometastasis (>2.0 mm): 2    # Lymph nodes with isolated tumor cells (<0.2 mm): 0    # Lymph nodes with micrometastasis (> 0.2 mm and < 2.0 mm): 1    Extranodal extension: Present  Treatment Effect:       In the breast: Definite response to presurgical therapy in the invasive carcinoma       In the lymph nodes: No definite response to presurgical therapy in metastatic carcinoma  Residual Cancer Burden (RCB):    Primary Tumor Bed  Primary tumor bed:23 mm x 11 mm    Overall cancer cellularity: 0.5%    Percentage of cancer that is in situ: 0%     Lymph nodes    # of lymph nodes positive for metastasis: 3    Diameter of largest metastasis: 8 mm    Residual Cancer Burden (RCB): 2.553       Residual Cancer Burden Class: RCB-II  Lymphovascular Invasion: Cannot be determined with extensive retraction  changes  Pathologic Stage Classification (pTNM, AJCC 8th Edition): ypT2(m) pN1a  TNM Descriptors: y (posttreatment); m (multiple foci of invasive  carcinoma)   RADIOGRAPHIC STUDIES: I have personally reviewed the radiological images as listed and agreed  with the findings in the report. # 05/02/2018 Unilateral right breast diagnostic mammogram and ultrasound were obtained and was negative.   ASSESSMENT/PLAN 68 y.o. female presents with cT3cN1cMo 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  S.p post left modified radical mastectomy with ALND, s/p radiation.   1. Malignant neoplasm of upper-outer quadrant of left breast in female, estrogen receptor positive ([place])   2. Osteopenia, unspecified location   3. Malignant neoplasm of lower-outer quadrant of left breast of female, estrogen receptor positive ([place])    # ypT2N1a M0 disease, ER+PR+, HER2-. Residual disease after neoadjuvant chemotherapy s/p RT/  Tolerate Letrozole 2.5 mg daily, continue, plan 10 years aromatase inhibitor.  Recommend unilateral right breast screening mammogram in July 2020, ordered. 4  # Osteopenia, on Zometa Q 7m Will proceed with Zometa at next visit.  Continue calcium and vitamin D supplements.  Check vitamin D level at next visit.  #  Vasomotor symptoms, due to letrozole. Manageable. Continue.   Follow up in 3 months.  We spent sufficient time to discuss many aspect of care, questions were answered to patient's satisfaction. Total face to face encounter time for this patient visit was 15 min. >50% of the time was  spent in counseling and coordination of care.    Earlie Server, MD, PhD Hematology Oncology [facility] Pager- [phone] 01/16/2019

## 2019-01-17 ENCOUNTER — Other Ambulatory Visit: Payer: Self-pay | Admitting: Oncology

## 2019-03-22 IMAGING — CT CT ABD-PELV W/ CM
2 of 5 series · 12 of 36 positions shown, 15 images · IV contrast (iopamidol)
Comparison: None.

CLINICAL DATA: Newly diagnosed breast cancer April 2017.

EXAM:
CT CHEST, ABDOMEN, AND PELVIS WITH CONTRAST
TECHNIQUE: Multidetector CT imaging of the chest, abdomen and pelvis was
performed following the standard protocol during bolus
administration of intravenous contrast.
CONTRAST:  100mL S6K5OB-DZZ IOPAMIDOL (S6K5OB-DZZ) INJECTION 61%

[Series 2: cap with · axial · 0.98mm/px · z∈[-566,-71]mm · 9 of 123 slices shown, 12 images]
[im 12/123  mediastinal]
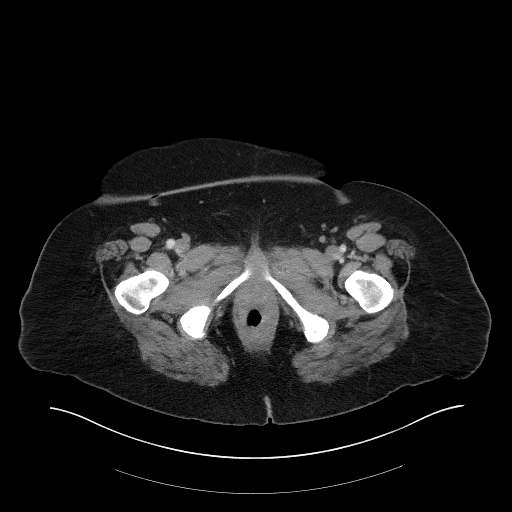
[im 12/123  lung]
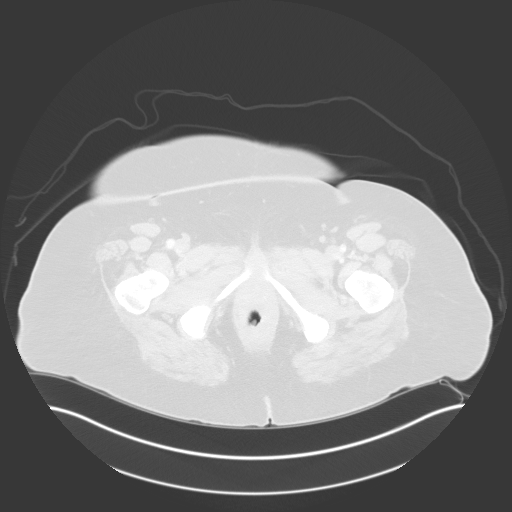
[im 23/123  lung]
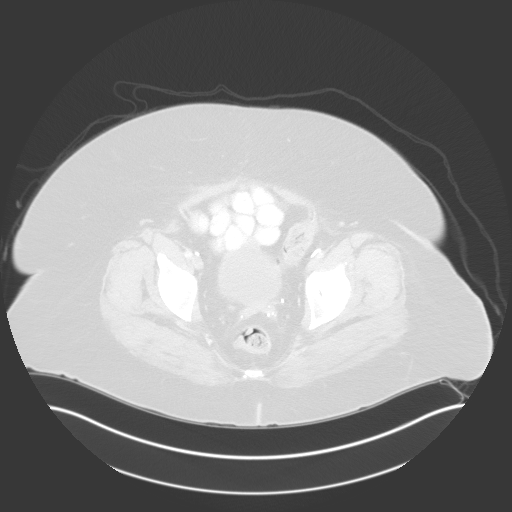
[im 34/123  lung]
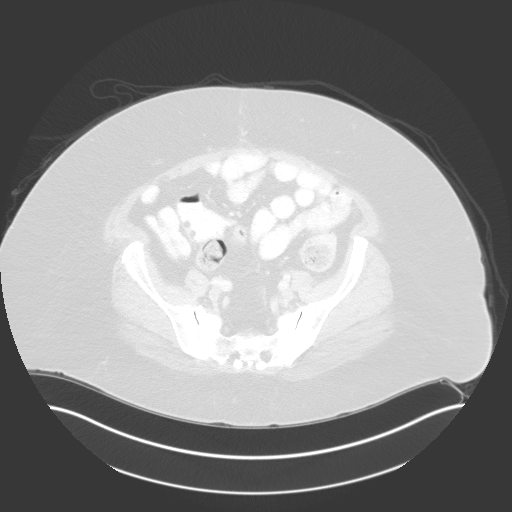
[im 45/123  lung]
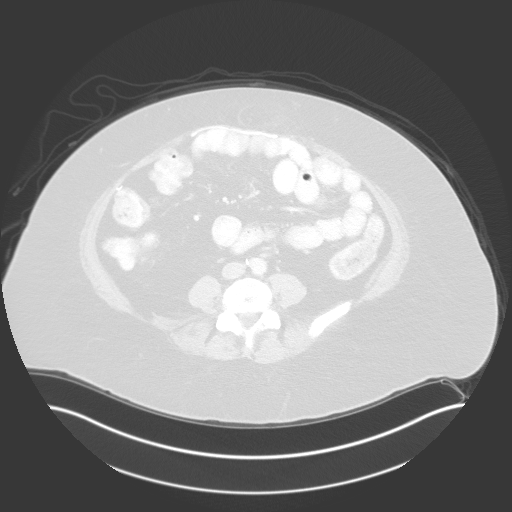
[im 67/123  mediastinal]
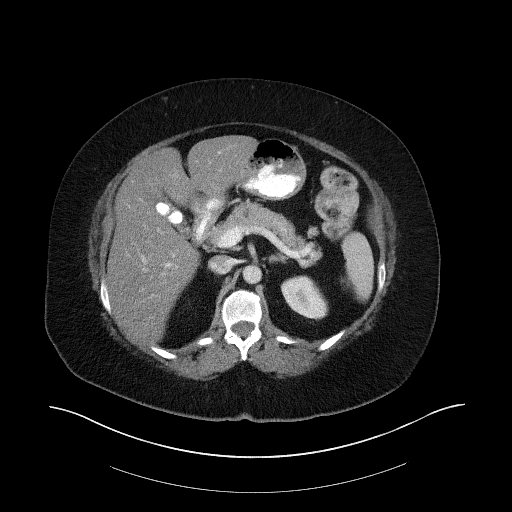
[im 67/123  lung]
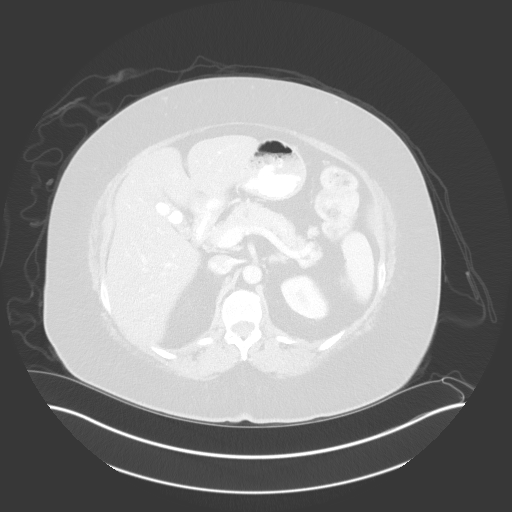
[im 78/123  lung]
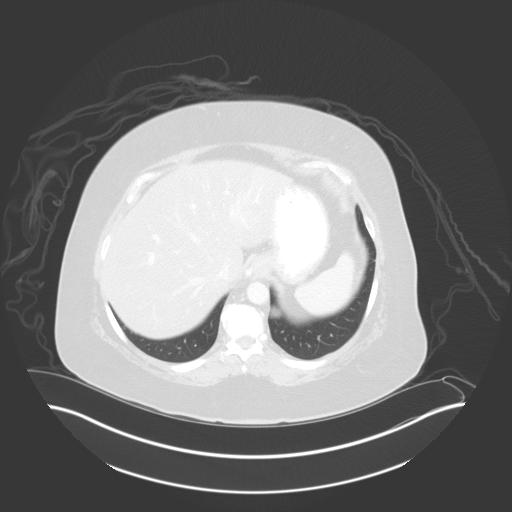
[im 89/123  lung]
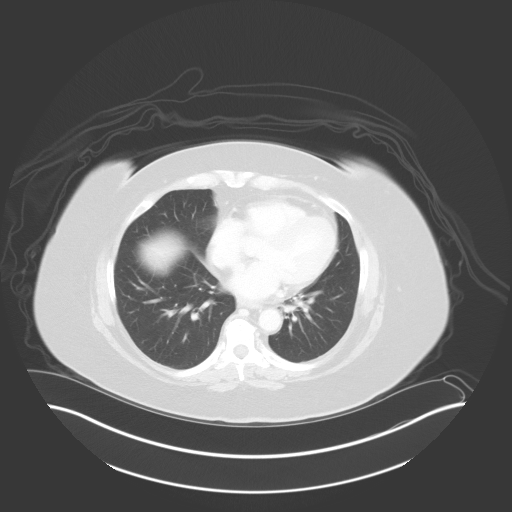
[im 100/123  lung]
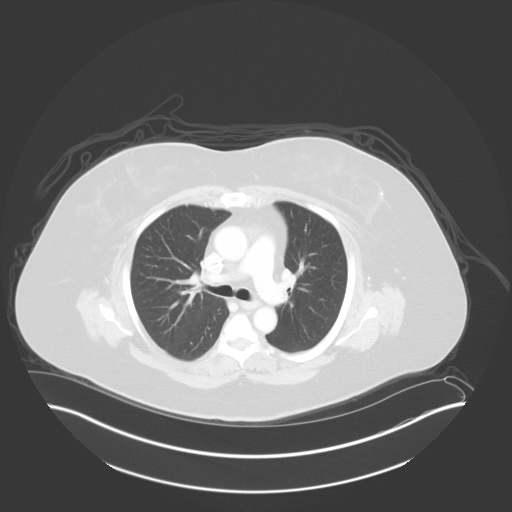
[im 111/123  mediastinal]
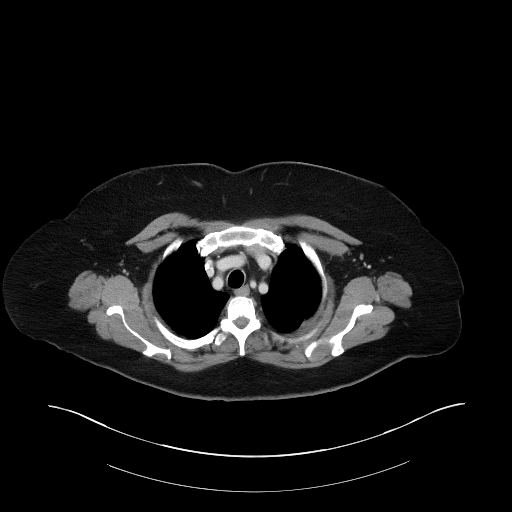
[im 111/123  lung]
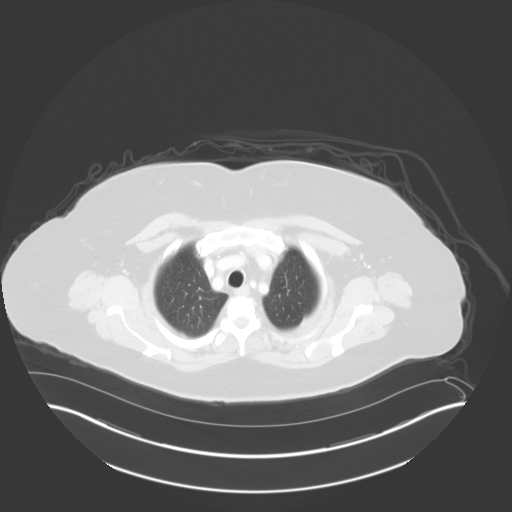

[Series 5: coronals · coronal · 0.63mm/px · 3 of 135 slices shown]
[im 27/135  lung]
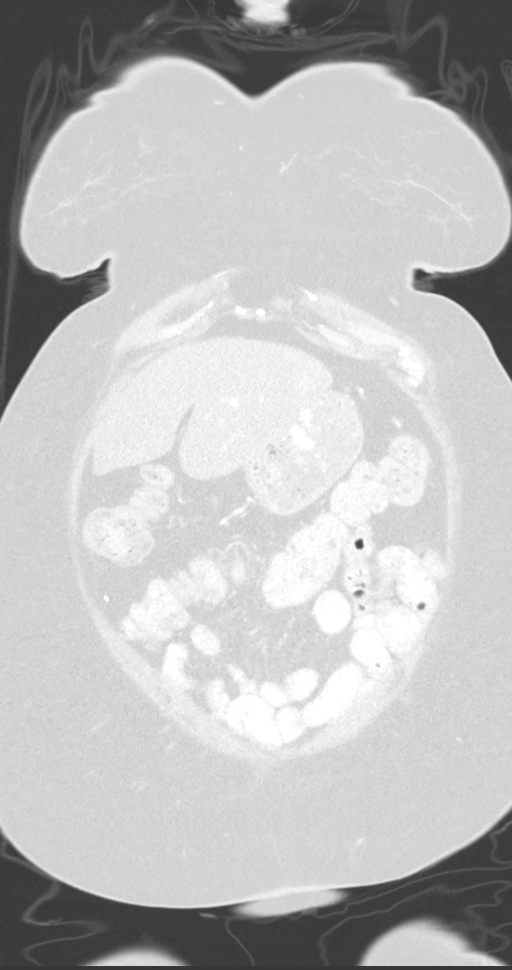
[im 54/135  lung]
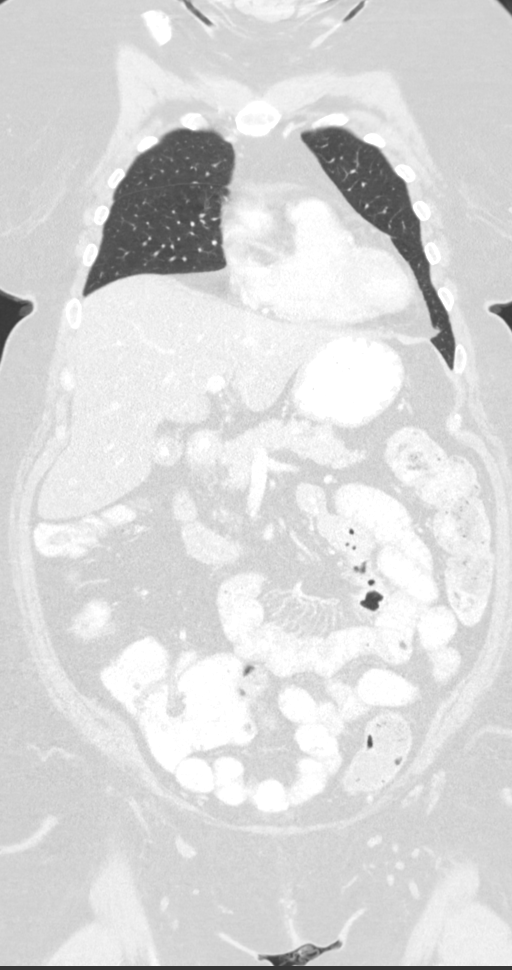
[im 81/135  lung]
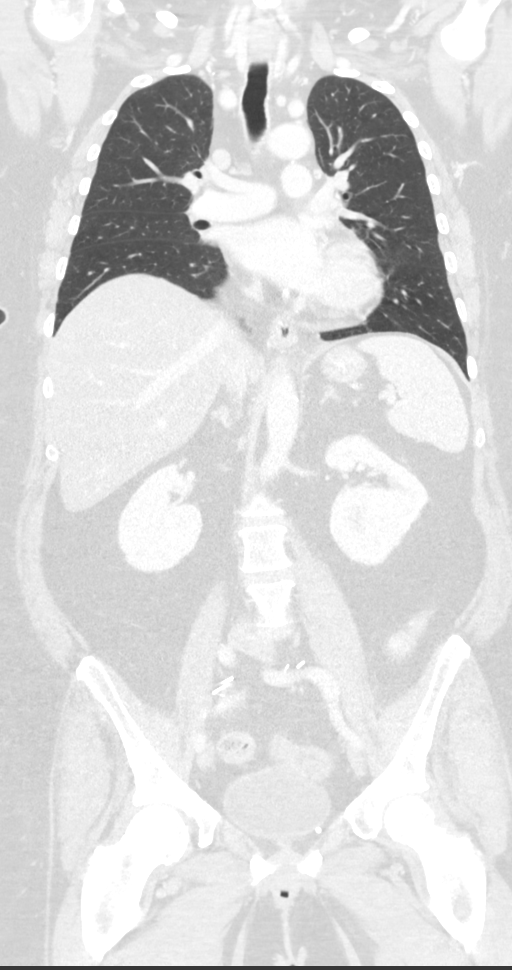

[12 of 36 positions shown; findings below may reference images not displayed]

FINDINGS: CT CHEST FINDINGS

Cardiovascular: The heart is normal in size. No pericardial
effusion. The aorta is normal in caliber. No dissection. Scattered
atherosclerotic calcifications. No obvious coronary artery
calcifications. Right IJ Port-A-Cath tip is in the region of the
cavoatrial junction.

Mediastinum/Nodes: No mediastinal or hilar mass or lymphadenopathy.
Small scattered lymph nodes are noted. The esophagus is grossly
normal.

Lungs/Pleura: No worrisome pulmonary nodules to suggest metastatic
disease. No acute pulmonary findings. No interstitial lung disease
or bronchiectasis.

Musculoskeletal: A deep left breast lesion is noted with a biopsy
clip in it. No supraclavicular or axillary adenopathy. The thyroid
gland appears normal.

No significant bony findings. No lytic or sclerotic bone lesions are
identified.

CT ABDOMEN PELVIS FINDINGS

Hepatobiliary: Diffuse fatty infiltration of the liver with areas of
fatty sparing. No focal hepatic lesions or intrahepatic biliary
dilatation. Multiple calcified gallstones in the gallbladder but no
findings for acute cholecystitis. No common bile duct dilatation.

Pancreas: No mass, inflammation or ductal dilatation.

Spleen: Normal size.  No focal lesions.

Adrenals/Urinary Tract: Adrenal glands and kidneys are unremarkable.
No worrisome renal lesions. Duplicated collecting system noted on
the left. No ureteral or bladder calculi. No bladder mass.

Stomach/Bowel: The stomach, duodenum, small bowel and colon are
unremarkable. No acute inflammatory changes, mass lesions or
obstructive findings. The terminal ileum is normal.

Vascular/Lymphatic: The aorta is normal in caliber. No dissection.
The branch vessels are patent. The major venous structures are
patent. No mesenteric or retroperitoneal mass or adenopathy. Small
scattered lymph nodes are noted. Surgical clips are noted in the
retroperitoneum and pelvis suggesting prior lymph node dissection
related to remote uterine cancer.

Reproductive: Surgically absent.

Other: No pelvic mass or adenopathy. No free pelvic fluid
collections. No inguinal mass or adenopathy. Anterior abdominal wall
hernia repair with mesh. There is a recurrent midline hernia which
contains fat and a small portion of the transverse colon.

Musculoskeletal: No significant bony findings. No lytic or sclerotic
bone lesions to suggest osseous metastatic disease. Sclerotic
changes surrounding the SI joints and pubic symphysis probably due
to remote osteitis pubis and osteitis condensans ilii.
IMPRESSION: 1. Deep left breast lesion containing a biopsy clip but no enlarged
supraclavicular or axillary lymph nodes.
2. No CT findings to suggest metastatic disease.
3. Diffuse fatty infiltration of the liver and cholelithiasis.
4. Anterior abdominal wall hernia repair with mesh. Just above the
mass there is a anterior abdominal wall hernia containing fat and a
small portion of the transverse colon.
5. No findings suspicious for osseous metastatic disease.

## 2019-05-05 ENCOUNTER — Ambulatory Visit
Admission: RE | Admit: 2019-05-05 | Discharge: 2019-05-05 | Disposition: A | Discharge status: Home / Self Care | Payer: Medicare HMO | Source: Ambulatory Visit | Attending: Oncology | Admitting: Oncology

## 2019-05-05 ENCOUNTER — Other Ambulatory Visit: Payer: Self-pay

## 2019-05-05 DIAGNOSIS — C50412 Malignant neoplasm of upper-outer quadrant of left female breast: Secondary | ICD-10-CM

## 2019-05-05 DIAGNOSIS — Z1231 Encounter for screening mammogram for malignant neoplasm of breast: Secondary | ICD-10-CM | POA: Diagnosis not present

## 2019-05-05 DIAGNOSIS — Z853 Personal history of malignant neoplasm of breast: Secondary | ICD-10-CM | POA: Diagnosis not present

## 2019-05-05 DIAGNOSIS — Z170 Estrogen receptor positive status [ER+]: Secondary | ICD-10-CM

## 2019-05-05 HISTORY — DX: Personal history of antineoplastic chemotherapy: Z92.21

## 2019-05-05 HISTORY — DX: Personal history of irradiation: Z92.3

## 2019-05-06 ENCOUNTER — Other Ambulatory Visit: Payer: Self-pay

## 2019-05-06 DIAGNOSIS — C50412 Malignant neoplasm of upper-outer quadrant of left female breast: Secondary | ICD-10-CM

## 2019-05-07 ENCOUNTER — Encounter: Payer: Self-pay | Admitting: Oncology

## 2019-05-07 ENCOUNTER — Other Ambulatory Visit: Payer: Self-pay

## 2019-05-07 ENCOUNTER — Inpatient Hospital Stay: Payer: Medicare HMO | Attending: Oncology | Admitting: Oncology

## 2019-05-07 ENCOUNTER — Inpatient Hospital Stay: Payer: Medicare HMO

## 2019-05-07 VITALS — BP 164/82 | HR 76 | Temp 99.5°F | Resp 18 | Wt 224.6 lb

## 2019-05-07 DIAGNOSIS — Z7984 Long term (current) use of oral hypoglycemic drugs: Secondary | ICD-10-CM | POA: Insufficient documentation

## 2019-05-07 DIAGNOSIS — Z79811 Long term (current) use of aromatase inhibitors: Secondary | ICD-10-CM | POA: Insufficient documentation

## 2019-05-07 DIAGNOSIS — G4730 Sleep apnea, unspecified: Secondary | ICD-10-CM | POA: Diagnosis not present

## 2019-05-07 DIAGNOSIS — C773 Secondary and unspecified malignant neoplasm of axilla and upper limb lymph nodes: Secondary | ICD-10-CM | POA: Insufficient documentation

## 2019-05-07 DIAGNOSIS — I10 Essential (primary) hypertension: Secondary | ICD-10-CM | POA: Diagnosis not present

## 2019-05-07 DIAGNOSIS — C50412 Malignant neoplasm of upper-outer quadrant of left female breast: Secondary | ICD-10-CM | POA: Diagnosis present

## 2019-05-07 DIAGNOSIS — E860 Dehydration: Secondary | ICD-10-CM

## 2019-05-07 DIAGNOSIS — Z9012 Acquired absence of left breast and nipple: Secondary | ICD-10-CM | POA: Diagnosis not present

## 2019-05-07 DIAGNOSIS — M8580 Other specified disorders of bone density and structure, unspecified site: Secondary | ICD-10-CM | POA: Diagnosis not present

## 2019-05-07 DIAGNOSIS — Z170 Estrogen receptor positive status [ER+]: Secondary | ICD-10-CM | POA: Diagnosis not present

## 2019-05-07 DIAGNOSIS — Z923 Personal history of irradiation: Secondary | ICD-10-CM | POA: Insufficient documentation

## 2019-05-07 DIAGNOSIS — Z79899 Other long term (current) drug therapy: Secondary | ICD-10-CM | POA: Insufficient documentation

## 2019-05-07 DIAGNOSIS — E119 Type 2 diabetes mellitus without complications: Secondary | ICD-10-CM | POA: Insufficient documentation

## 2019-05-07 DIAGNOSIS — Z803 Family history of malignant neoplasm of breast: Secondary | ICD-10-CM

## 2019-05-07 DIAGNOSIS — Z8542 Personal history of malignant neoplasm of other parts of uterus: Secondary | ICD-10-CM | POA: Diagnosis not present

## 2019-05-07 LAB — COMPREHENSIVE METABOLIC PANEL
ALT: 16 U/L (ref 0–44)
AST: 20 U/L (ref 15–41)
Albumin: 3.9 g/dL (ref 3.5–5.0)
Alkaline Phosphatase: 79 U/L (ref 38–126)
Anion gap: 6 (ref 5–15)
BUN: 18 mg/dL (ref 8–23)
CO2: 26 mmol/L (ref 22–32)
Calcium: 9.7 mg/dL (ref 8.9–10.3)
Chloride: 105 mmol/L (ref 98–111)
Creatinine, Ser: 0.66 mg/dL (ref 0.44–1.00)
GFR calc Af Amer: >60 mL/min (ref >60)
GFR calc non Af Amer: >60 mL/min (ref >60)
Glucose, Bld: 170 mg/dL — ABNORMAL HIGH (ref 70–99)
Potassium: 4 mmol/L (ref 3.5–5.1)
Sodium: 137 mmol/L (ref 135–145)
Total Bilirubin: 0.5 mg/dL (ref 0.3–1.2)
Total Protein: 7.3 g/dL (ref 6.5–8.1)

## 2019-05-07 LAB — CBC WITH DIFFERENTIAL/PLATELET
Abs Immature Granulocytes: 0.07 10*3/uL (ref 0.00–0.07)
Basophils Absolute: 0.1 10*3/uL (ref 0.0–0.1)
Basophils Relative: 1 %
Eosinophils Absolute: 0.5 10*3/uL (ref 0.0–0.5)
Eosinophils Relative: 7 %
HCT: 37 % (ref 36.0–46.0)
Hemoglobin: 12.4 g/dL (ref 12.0–15.0)
Immature Granulocytes: 1 %
Lymphocytes Relative: 17 %
Lymphs Abs: 1.2 10*3/uL (ref 0.7–4.0)
MCH: 28.7 pg (ref 26.0–34.0)
MCHC: 33.5 g/dL (ref 30.0–36.0)
MCV: 85.6 fL (ref 80.0–100.0)
Monocytes Absolute: 0.7 10*3/uL (ref 0.1–1.0)
Monocytes Relative: 9 %
Neutro Abs: 4.9 10*3/uL (ref 1.7–7.7)
Neutrophils Relative %: 65 %
Platelets: 287 10*3/uL (ref 150–400)
RBC: 4.32 MIL/uL (ref 3.87–5.11)
RDW: 13.4 % (ref 11.5–15.5)
WBC: 7.5 10*3/uL (ref 4.0–10.5)
nRBC: 0 % (ref 0.0–0.2)

## 2019-05-07 MED ORDER — SODIUM CHLORIDE 0.9 % IV SOLN
INTRAVENOUS | Status: DC
  Administered 2019-05-07: 11:00:00 via INTRAVENOUS
  Filled 2019-05-07: qty 250

## 2019-05-07 MED ORDER — ZOLEDRONIC ACID 4 MG/100ML IV SOLN
4.0000 mg | Freq: Once | INTRAVENOUS | Status: AC
  Administered 2019-05-07: 4 mg via INTRAVENOUS
  Filled 2019-05-07: qty 100

## 2019-05-07 NOTE — Progress Notes (Signed)
Pt in for follow up, denies any difficulties or concerns today. 

## 2019-05-07 NOTE — Progress Notes (Signed)
[facility] Follow Up Visit.  Patient Care Team: Glendon Axe, MD as PCP - General (Internal Medicine) Rico Junker, RN as Oncology Nurse Navigator Earlie Server, MD as Consulting Physician (Oncology) Leonie Green, MD as Referring Physician (Surgery) Noreene Filbert, MD as Referring Physician (Radiation Oncology)  Reason for visit Follow up for management of Stage IIA left breast cancer.   Pertinent Oncology History Dominique Guzman 68 y.o. female with PMH listed as below is referred by Dr.Schermerhorn to [name] for evaluation and management of newly diagnosed breast cancer. Patient had a history a left breast mass biopsy followed by breast excisonal biopsy in 2015 with benign pathology.  She also has a history of Uterine cancer which was treated with RT with Dr.Chrystal.  She underwent routine screening mammogram on 04/17/2017 which recommended a possible mass on the left breast. Left diagnostic mammogram showed a highly suspicious left breast mass at 4 o'clock, 7 cm from the nipple and 1 o'clock,,  About 3 cm from the nipple. Enlarged left axillary lymph node suspicious for metastatic disease.  1 US breast confirmed a left breast mass of 2.1cm, 4 o'clock, and 1.1 cm mass at 1 o'clock.The masses at 1 and 4 o'clock are separated by approximately 4 cm sonographically 2 Biopsy of the two mass and axillary lymph node revealed invasive carcinoma, grade 2. Both are ER,PR positive. HER2 equivocal, FISH pending. 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  3 05/14/2017 MRI breast showed Two sites of biopsy proven malignancy seen in the left breast, which span up to 11.3 cm. There are intervening areas of non mass enhancement and a small suspicious enhancing mass. 4 Case was discussed on breast tumor board on 05/21/2017 and the panel agree with neoadjuvant ddAC to T followed by surgery. Clinically cT3N1M0, Stage IIA  # Genetic testing Testing did not reveal a  pathogenic mutation in any of the genes analyzed. A copy of the genetic test report will be scanned into Epic under the Media tab.The genes analyzed were the 83 genes on [facility]'s Multi-Cancer panel (ALK, APC, ATM, AXIN2, BAP1, BARD1, BLM, BMPR1A, BRCA1, BRCA2, BRIP1, CASR, CDC73, CDH1, CDK4, CDKN1B, CDKN1C, CDKN2A, CEBPA, CHEK2, CTNNA1, DICER1, DIS3L2, EGFR, EPCAM, FH, FLCN, GATA2, GPC3, GREM1, HOXB13, HRAS, KIT, MAX, MEN1, MET, MITF, MLH1, MSH2, MSH3, MSH6, MUTYH, NBN, NF1, NF2, NTHL1, PALB2, PDGFRA, PHOX2B, PMS2, POLD1, POLE, POT1, PRKAR1A, PTCH1, PTEN, RAD50, RAD51C, RAD51D, RB1, RECQL4, RET, RUNX1, SDHA, SDHAF2, SDHB, SDHC, SDHD, SMAD4, SMARCA4, SMARCB1, SMARCE1, STK11, SUFU, TERC, TERT, TMEM127, TP53, TSC1, TSC2, VHL, WRN, WT1).   #Cancer treatment  Neoadjuvant chemotherapy 05/23/2017- 10/03/2017 patient completed s/p 4 cycles ddAC and weekly taxol x 12, on 10/26/2017 patient underwent that surgery.  Left axillary sentinel lymph node was positive, so Dr. Tamala Julian proceeded with left mastectomy and left axillary lymph node dissection. Pathology showed ypT2(m) pN1a, ER/PR positive, HER2 FISH negative, grade 2, negative margin, invasive mammary carcinoma.  S/p adjuvant RT.  Declined NATALEE Trial Clinical trial for pabociclib and letrozole.   # May 2019 Started on Letrozole  # referred for Medi port removal by Dr.Cintron.    INTERVAL HISTORY Patient presents for follow up for history of breast cancer ypT2 yp N1, s/p neoadjuvant chemotherapy and surgery ad adjuvant RT.  Today he presents for 3 months follow up.  She has right unilateral screening mammogram on 05/05/2019.  No new complaints of her breast and left mastectomy sites.  Denies any new bone pain, fever, chills, sore throat,  weight loss or abdominal pain.  She tolerates Letrozol well, manageable vasomotor symptoms.  She is going to retire next month.   Review of Systems  Constitutional: Negative for appetite change, chills, fatigue and  fever.  HENT:   Negative for hearing loss and voice change.   Eyes: Negative for eye problems.  Respiratory: Negative for chest tightness and cough.   Cardiovascular: Negative for chest pain.  Gastrointestinal: Negative for abdominal distention, abdominal pain and blood in stool.  Endocrine: Positive for hot flashes.  Genitourinary: Negative for difficulty urinating and frequency.   Musculoskeletal: Negative for arthralgias.  Skin: Negative for itching and rash.  Neurological: Negative for extremity weakness.  Hematological: Negative for adenopathy.  Psychiatric/Behavioral: Negative for confusion.    MEDICAL HISTORY: Past Medical History:  Diagnosis Date  . Anemia   . Arthritis    HIP-LEFT  . Benign neoplasm of colon   . Breast cancer ([place]) 2018   left breast  . Cancer ([place])   . Diabetes mellitus without complication ([place])   . Genetic testing 01/10/2018   Multi-Cancer panel (83 genes) @ [facility] - No pathogenic mutations detected  . H/O osteopenia   . History of hiatal hernia   . Hypertension   . Personal history of chemotherapy   . Personal history of radiation therapy   . Sleep apnea    USE CPAP  . Uterine cancer [facility])     SURGICAL HISTORY: Past Surgical History:  Procedure Laterality Date  . ABDOMINAL HYSTERECTOMY    . BREAST BIOPSY Left 04/2014   negative  . BREAST EXCISIONAL BIOPSY Left 2015  . CESAREAN SECTION     X2  . COLONOSCOPY WITH PROPOFOL N/A 04/15/2015   Procedure: COLONOSCOPY WITH PROPOFOL;  Surgeon: Hulen Luster, MD;  Location: [facility] ENDOSCOPY;  Service: Gastroenterology;  Laterality: N/A;  . HERNIA REPAIR    . MASTECTOMY Left 10/2017  . MASTECTOMY MODIFIED RADICAL Left 10/26/2017   Procedure: MASTECTOMY MODIFIED RADICAL;  Surgeon: Leonie Green, MD;  Location: [facility] ORS;  Service: General;  Laterality: Left;  [name] MASTECTOMY W/ SENTINEL NODE BIOPSY Left 10/26/2017   Procedure: MASTECTOMY WITH SENTINEL LYMPH NODE BIOPSY;  Surgeon: Leonie Green,  MD;  Location: [facility] ORS;  Service: General;  Laterality: Left;  . PORTACATH PLACEMENT Right 05/22/2017   Procedure: INSERTION PORT-A-CATH;  Surgeon: Leonie Green, MD;  Location: [facility] ORS;  Service: General;  Laterality: Right;    SOCIAL HISTORY: Social History   Socioeconomic History  . Marital status: Married    Spouse name: Not on file  . Number of children: Not on file  . Years of education: Not on file  . Highest education level: Not on file  Occupational History  . Not on file  Social Needs  . Financial resource strain: Not on file  . Food insecurity    Worry: Not on file    Inability: Not on file  . Transportation needs    Medical: Not on file    Non-medical: Not on file  Tobacco Use  . Smoking status: Never Smoker  . Smokeless tobacco: Never Used  Substance and Sexual Activity  . Alcohol use: No  . Drug use: No  . Sexual activity: Not on file  Lifestyle  . Physical activity    Days per week: Not on file    Minutes per session: Not on file  . Stress: Not on file  Relationships  . Social connections    Talks on phone: Not on file  Gets together: Not on file    Attends religious service: Not on file    Active member of club or organization: Not on file    Attends meetings of clubs or organizations: Not on file    Relationship status: Not on file  . Intimate partner violence    Fear of current or ex partner: Not on file    Emotionally abused: Not on file    Physically abused: Not on file    Forced sexual activity: Not on file  Other Topics Concern  . Not on file  Social History Narrative  . Not on file    FAMILY HISTORY Family History  Problem Relation Age of Onset  . Diabetes Mother   . Diabetes Father   . Melanoma Father 45       on finger  . Diabetes Sister   . Pancreatic cancer Paternal Grandfather        deceased 110s  . Breast cancer Other 87       paternal grandmother's mother    ALLERGIES:  has No Known Allergies.  MEDICATIONS:   Current Outpatient Medications  Medication Sig Dispense Refill  . Biotin 10 MG TABS Take by mouth.    . Calcium Carb-Cholecalciferol (CALCIUM 600-D PO) Take 1 tablet by mouth daily.    . Cinnamon 500 MG capsule Take 1,000 mg by mouth daily.     . [employer] B6 100 MG tablet TAKE 1 TABLET BY MOUTH EVERY DAY 100 tablet 3  . cyanocobalamin 1000 MCG tablet Take 1,000 mcg by mouth daily.    [name] FIBER PO Take 2 each by mouth daily.     [name] GLUCOSAMINE-CHONDROITIN PO Take 1 tablet by mouth daily.    [name] letrozole (FEMARA) 2.5 MG tablet Take 1 tablet (2.5 mg total) by mouth daily. 90 tablet 3  . lisinopril-hydrochlorothiazide (PRINZIDE,ZESTORETIC) 20-12.5 MG per tablet Take 1 tablet by mouth daily.    . metFORMIN (GLUCOPHAGE) 500 MG tablet Take 1,000 mg by mouth 2 (two) times daily.     . mirabegron ER (MYRBETRIQ) 25 MG TB24 tablet Take by mouth.    . Multiple Vitamin (MULTIVITAMIN) tablet Take 1 tablet by mouth daily.    . pioglitazone (ACTOS) 15 MG tablet Take 30 mg by mouth daily.   1  . diphenhydrAMINE (BENADRYL) 25 MG tablet Take 25 mg by mouth every 6 (six) hours as needed for allergies.     [name] loperamide (IMODIUM) 2 MG capsule Take 1 capsule (2 mg total) by mouth See admin instructions. With onset of loose stool, take 41m followed by 267mevery 2 hours until 12 hours have passed without loose bowel movement. Maximum: 16 mg/day (Patient not taking: Reported on 08/29/2018) 120 capsule 1   No current facility-administered medications for this visit.     PHYSICAL EXAMINATION:  ECOG PERFORMANCE STATUS: 1 - Symptomatic but completely ambulatory  Vitals:   05/07/19 1032  BP: (!) 164/82  Pulse: 76  Resp: 18  Temp: 99.5 F (37.5 C)    Filed Weights   05/07/19 1032  Weight: 224 lb 9.6 oz (101.9 kg)     Physical Exam  Constitutional: She is oriented to person, place, and time. No distress.  Obese,   HENT:  Head: Normocephalic and atraumatic.  Nose: Nose normal.  Mouth/Throat: Oropharynx is clear  and moist. No oropharyngeal exudate.  Eyes: Pupils are equal, round, and reactive to light. EOM are normal. No scleral icterus.  Neck: Normal range of motion. Neck supple.  Cardiovascular: Normal rate and regular rhythm.  No murmur heard. Pulmonary/Chest: Effort normal. No respiratory distress. She has no rales. She exhibits no tenderness.  Abdominal: Soft. She exhibits no distension. There is no abdominal tenderness.  Musculoskeletal: Normal range of motion.        General: No edema.  Neurological: She is alert and oriented to person, place, and time. No cranial nerve deficit. She exhibits normal muscle tone. Coordination normal.  Skin: Skin is warm and dry. She is not diaphoretic. No erythema.  Psychiatric: Affect normal.  Breast exam is performed in seating position. Patient is status post left mastectomy. There is no evidence of any chest wall recurrence. No evidence of bilateral axillary adenopathy. No palpable mass of right breast.   ECOG 0  LABORATORY DATA: CBC Latest Ref Rng & Units 05/07/2019 01/15/2019 09/13/2018  WBC 4.0 - 10.5 K/uL 7.5 8.0 7.5  Hemoglobin 12.0 - 15.0 g/dL 12.4 12.3 11.8(L)  Hematocrit 36.0 - 46.0 % 37.0 36.8 36.2  Platelets 150 - 400 K/uL 287 267 297   CMP Latest Ref Rng & Units 05/07/2019 01/15/2019 09/13/2018  Glucose 70 - 99 mg/dL 170(H) 200(H) 178(H)  BUN 8 - 23 mg/dL 18 20 24(H)  Creatinine 0.44 - 1.00 mg/dL 0.66 0.81 0.75  Sodium 135 - 145 mmol/L 137 136 139  Potassium 3.5 - 5.1 mmol/L 4.0 4.4 3.9  Chloride 98 - 111 mmol/L 105 101 103  CO2 22 - 32 mmol/L '26 29 28  ' Calcium 8.9 - 10.3 mg/dL 9.7 9.7 9.6  Total Protein 6.5 - 8.1 g/dL 7.3 7.0 7.0  Total Bilirubin 0.3 - 1.2 mg/dL 0.5 0.4 0.4  Alkaline Phos 38 - 126 U/L 79 83 75  AST 15 - 41 U/L '20 18 22  ' ALT 0 - 44 U/L '16 14 14   ' PATHOLOGY 10/27/2017  Surgical Pathology  CASE: [id number]  PATIENT: [facility]  Surgical Pathology Report  SPECIMEN SUBMITTED:  A. Sentinel lymph node #1, left  axilla  B. Sentinel lymph nodes #2 and 3, left axilla  C. Breast, left; stitch outer end of skin ellipse, and axillary lymph  nodes  DIAGNOSIS:  A. SENTINEL LYMPH NODE #1, LEFT AXILLA; EXCISION:  - METASTATIC MAMMARY CARCINOMA, ONE LYMPH NODE, 5 MM METASTASIS (1/1).   B. SENTINEL LYMPH NODES #2 AND #3, LEFT AXILLA; EXCISION:  - NEGATIVE FOR MALIGNANCY, THREE LYMPH NODES (0/3).   C. BREAST, LEFT; MODIFIED RADICAL MASTECTOMY:  - INVASIVE MAMMARY CARCINOMA, TWO SITES, SEE SUMMARY BELOW.  - BIOPSY SITE CHANGES AT TWO SITES; ONE MARKER CLIP FOUND.  - METASTATIC MAMMARY CARCINOMA IN TWO OF FOUR AXILLARY LYMPH NODES, WITH  LARGER METASTASIS (8 MM) LOCATED IN NODE WITH MARKER CLIP (2/4).   Comment:  ER, PR, and HER2 were assessed on the previous core biopsies  ([id number], 04/25/2017) with results summarized as follows:  ER IHC (SP1): Both tumors positive, >90%, strong staining  PR IHC (IE2): Both tumors positive, 1:00 tumor 51-90%, mod to strong,  and 4:00 tumor >90% with strong staining  HER2 IHC (HercepTest): Both tumors equivocal, score 2+  HER2 FISH (DAKO IQFISH PharmDX): Both tumors negative (not amplified)   Surgical Pathology Cancer Case Summary  INVASIVE CARCINOMA OF THE BREAST:  Procedure: Total mastectomy  Specimen Laterality: Left  Histologic Type: Invasive carcinoma of no special type  Histologic Grade (Not[place] Histologic Score)       Glandular (Acinar)/Tubular Differentiation: Score 3       Nuclear Pleomorphism: Score 2  Mitotic Rate: Score 1       Overall Grade: Grade 2  Tumor Size: 23 mm (4-5:00 posterior tumor); 12 mm (1:00 tumor)  Ductal Carcinoma In Situ (DCIS): Present  DCIS Nuclear Grade: Intermediate grade  Extensive intraductal component: Negative for extensive intraductal  component  Margins:       Invasive carcinoma margins: Uninvolved by invasive carcinoma       Distance from closest margin: 0.2 mm, deep        DCIS margins: Uninvolved by DCIS       Distance from closest margin: Cannot be determined; margins  not present in sections containing DCIS  Regional Lymph nodes:    Total # lymph nodes examined: 8    # Sentinel lymph nodes examined: 4    # Lymph nodes with macrometastasis (>2.0 mm): 2    # Lymph nodes with isolated tumor cells (<0.2 mm): 0    # Lymph nodes with micrometastasis (> 0.2 mm and < 2.0 mm): 1    Extranodal extension: Present  Treatment Effect:       In the breast: Definite response to presurgical therapy in the invasive carcinoma       In the lymph nodes: No definite response to presurgical therapy in metastatic carcinoma  Residual Cancer Burden (RCB):    Primary Tumor Bed  Primary tumor bed:23 mm x 11 mm    Overall cancer cellularity: 0.5%    Percentage of cancer that is in situ: 0%     Lymph nodes    # of lymph nodes positive for metastasis: 3    Diameter of largest metastasis: 8 mm    Residual Cancer Burden (RCB): 2.553       Residual Cancer Burden Class: RCB-II  Lymphovascular Invasion: Cannot be determined with extensive retraction  changes  Pathologic Stage Classification (pTNM, AJCC 8th Edition): ypT2(m) pN1a  TNM Descriptors: y (posttreatment); m (multiple foci of invasive  carcinoma)   RADIOGRAPHIC STUDIES: I have personally reviewed the radiological images as listed and agreed with the findings in the report. # 05/02/2018 Unilateral right breast diagnostic mammogram and ultrasound were obtained and was negative.   ASSESSMENT/PLAN 68 y.o. female presents with cT3cN1cMo 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  S.p post left modified radical mastectomy with ALND, s/p radiation.   1. Malignant neoplasm of upper-outer quadrant of left breast in female, estrogen receptor positive  ([place])   2. Osteopenia, unspecified location   3. Aromatase inhibitor use    # ypT2N1a M0 disease, ER+PR+, HER2-. Residual disease after neoadjuvant chemotherapy s/p RT/  Tolerates letrozole 2.5 mg daily.  Manageable hot flash. Plan to continue 10 years of aromatase inhibitor. Unilateral right breast screening mammogram 05/05/2019, images was independently reviewed and discussed with the patient.  No mammographic evidence of right breast.   # Osteopenia, on Zometa Q 68m   Proceed with Zometa today.  Patient asked about rationale of Zometa and this was rediscussed again. She appreciated explanation and willing to proceed. Continue calcium and vitamin D supplementation. Vitamin D level is pending.  Follow up in 3 months. ZEarlie Server MD, PhD [facility]7/29/2020

## 2019-05-08 LAB — VITAMIN D 25 HYDROXY (VIT D DEFICIENCY, FRACTURES): Vit D, 25-Hydroxy: 54.1 ng/mL (ref 30.0–100.0)

## 2019-06-11 ENCOUNTER — Other Ambulatory Visit: Payer: Self-pay | Admitting: *Deleted

## 2019-06-11 MED ORDER — LETROZOLE 2.5 MG PO TABS: 2.5000 mg | ORAL_TABLET | Freq: Every day | ORAL | 0 refills | Status: DC

## 2019-08-04 ENCOUNTER — Other Ambulatory Visit: Payer: Self-pay

## 2019-08-04 DIAGNOSIS — C50412 Malignant neoplasm of upper-outer quadrant of left female breast: Secondary | ICD-10-CM

## 2019-08-04 DIAGNOSIS — Z170 Estrogen receptor positive status [ER+]: Secondary | ICD-10-CM

## 2019-08-04 NOTE — Progress Notes (Signed)
Patient is coming in for follow up she is doing well no complaints  

## 2019-08-05 ENCOUNTER — Inpatient Hospital Stay (HOSPITAL_BASED_OUTPATIENT_CLINIC_OR_DEPARTMENT_OTHER): Payer: Medicare HMO | Admitting: Oncology

## 2019-08-05 ENCOUNTER — Encounter: Payer: Self-pay | Admitting: Oncology

## 2019-08-05 ENCOUNTER — Inpatient Hospital Stay: Payer: Medicare HMO | Attending: Oncology

## 2019-08-05 ENCOUNTER — Other Ambulatory Visit: Payer: Self-pay

## 2019-08-05 VITALS — BP 150/77 | HR 91 | Temp 98.4°F | Wt 231.8 lb

## 2019-08-05 DIAGNOSIS — M8580 Other specified disorders of bone density and structure, unspecified site: Secondary | ICD-10-CM | POA: Insufficient documentation

## 2019-08-05 DIAGNOSIS — G4730 Sleep apnea, unspecified: Secondary | ICD-10-CM | POA: Insufficient documentation

## 2019-08-05 DIAGNOSIS — C773 Secondary and unspecified malignant neoplasm of axilla and upper limb lymph nodes: Secondary | ICD-10-CM | POA: Diagnosis not present

## 2019-08-05 DIAGNOSIS — Z79899 Other long term (current) drug therapy: Secondary | ICD-10-CM | POA: Insufficient documentation

## 2019-08-05 DIAGNOSIS — Z90710 Acquired absence of both cervix and uterus: Secondary | ICD-10-CM | POA: Insufficient documentation

## 2019-08-05 DIAGNOSIS — Z9012 Acquired absence of left breast and nipple: Secondary | ICD-10-CM | POA: Insufficient documentation

## 2019-08-05 DIAGNOSIS — I10 Essential (primary) hypertension: Secondary | ICD-10-CM | POA: Diagnosis not present

## 2019-08-05 DIAGNOSIS — Z9221 Personal history of antineoplastic chemotherapy: Secondary | ICD-10-CM | POA: Diagnosis not present

## 2019-08-05 DIAGNOSIS — Z923 Personal history of irradiation: Secondary | ICD-10-CM | POA: Diagnosis not present

## 2019-08-05 DIAGNOSIS — E119 Type 2 diabetes mellitus without complications: Secondary | ICD-10-CM | POA: Diagnosis not present

## 2019-08-05 DIAGNOSIS — Z79811 Long term (current) use of aromatase inhibitors: Secondary | ICD-10-CM | POA: Insufficient documentation

## 2019-08-05 DIAGNOSIS — Z7984 Long term (current) use of oral hypoglycemic drugs: Secondary | ICD-10-CM | POA: Insufficient documentation

## 2019-08-05 DIAGNOSIS — Z803 Family history of malignant neoplasm of breast: Secondary | ICD-10-CM | POA: Diagnosis not present

## 2019-08-05 DIAGNOSIS — Z800 Family history of malignant neoplasm of digestive organs: Secondary | ICD-10-CM | POA: Diagnosis not present

## 2019-08-05 DIAGNOSIS — Z8542 Personal history of malignant neoplasm of other parts of uterus: Secondary | ICD-10-CM | POA: Diagnosis not present

## 2019-08-05 DIAGNOSIS — C50412 Malignant neoplasm of upper-outer quadrant of left female breast: Secondary | ICD-10-CM | POA: Diagnosis present

## 2019-08-05 DIAGNOSIS — Z170 Estrogen receptor positive status [ER+]: Secondary | ICD-10-CM

## 2019-08-05 DIAGNOSIS — D649 Anemia, unspecified: Secondary | ICD-10-CM | POA: Diagnosis not present

## 2019-08-05 LAB — CBC WITH DIFFERENTIAL/PLATELET
Abs Immature Granulocytes: 0.1 10*3/uL — ABNORMAL HIGH (ref 0.00–0.07)
Basophils Absolute: 0 10*3/uL (ref 0.0–0.1)
Basophils Relative: 1 %
Eosinophils Absolute: 0.3 10*3/uL (ref 0.0–0.5)
Eosinophils Relative: 4 %
HCT: 36.3 % (ref 36.0–46.0)
Hemoglobin: 11.7 g/dL — ABNORMAL LOW (ref 12.0–15.0)
Immature Granulocytes: 1 %
Lymphocytes Relative: 18 %
Lymphs Abs: 1.4 10*3/uL (ref 0.7–4.0)
MCH: 28.4 pg (ref 26.0–34.0)
MCHC: 32.2 g/dL (ref 30.0–36.0)
MCV: 88.1 fL (ref 80.0–100.0)
Monocytes Absolute: 0.7 10*3/uL (ref 0.1–1.0)
Monocytes Relative: 9 %
Neutro Abs: 5.5 10*3/uL (ref 1.7–7.7)
Neutrophils Relative %: 67 %
Platelets: 274 10*3/uL (ref 150–400)
RBC: 4.12 MIL/uL (ref 3.87–5.11)
RDW: 13.6 % (ref 11.5–15.5)
WBC: 8.1 10*3/uL (ref 4.0–10.5)
nRBC: 0 % (ref 0.0–0.2)

## 2019-08-05 LAB — COMPREHENSIVE METABOLIC PANEL
ALT: 13 U/L (ref 0–44)
AST: 16 U/L (ref 15–41)
Albumin: 3.7 g/dL (ref 3.5–5.0)
Alkaline Phosphatase: 77 U/L (ref 38–126)
Anion gap: 9 (ref 5–15)
BUN: 20 mg/dL (ref 8–23)
CO2: 28 mmol/L (ref 22–32)
Calcium: 9.7 mg/dL (ref 8.9–10.3)
Chloride: 101 mmol/L (ref 98–111)
Creatinine, Ser: 0.79 mg/dL (ref 0.44–1.00)
GFR calc Af Amer: >60 mL/min (ref >60)
GFR calc non Af Amer: >60 mL/min (ref >60)
Glucose, Bld: 174 mg/dL — ABNORMAL HIGH (ref 70–99)
Potassium: 4.1 mmol/L (ref 3.5–5.1)
Sodium: 138 mmol/L (ref 135–145)
Total Bilirubin: 0.4 mg/dL (ref 0.3–1.2)
Total Protein: 7.1 g/dL (ref 6.5–8.1)

## 2019-08-05 NOTE — Progress Notes (Signed)
Patient was pre-assessed for MD visit. 

## 2019-08-07 NOTE — Progress Notes (Signed)
[facility] Follow Up Visit.  Patient Care Team: Glendon Axe, MD as PCP - General (Internal Medicine) Rico Junker, RN as Oncology Nurse Navigator Earlie Server, MD as Consulting Physician (Oncology) Leonie Green, MD as Referring Physician (Surgery) Noreene Filbert, MD as Referring Physician (Radiation Oncology)  Reason for visit Follow up for management of Stage IIA left breast cancer.   Pertinent Oncology History Dominique Guzman 68 y.o. female with PMH listed as below is referred by Dr.Schermerhorn to [name] for evaluation and management of newly diagnosed breast cancer. Patient had a history a left breast mass biopsy followed by breast excisonal biopsy in 2015 with benign pathology.  She also has a history of Uterine cancer which was treated with RT with Dr.Chrystal.  She underwent routine screening mammogram on 04/17/2017 which recommended a possible mass on the left breast. Left diagnostic mammogram showed a highly suspicious left breast mass at 4 o'clock, 7 cm from the nipple and 1 o'clock,,  About 3 cm from the nipple. Enlarged left axillary lymph node suspicious for metastatic disease.  1 US breast confirmed a left breast mass of 2.1cm, 4 o'clock, and 1.1 cm mass at 1 o'clock.The masses at 1 and 4 o'clock are separated by approximately 4 cm sonographically 2 Biopsy of the two mass and axillary lymph node revealed invasive carcinoma, grade 2. Both are ER,PR positive. HER2 equivocal, FISH pending. 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  3 05/14/2017 MRI breast showed Two sites of biopsy proven malignancy seen in the left breast, which span up to 11.3 cm. There are intervening areas of non mass enhancement and a small suspicious enhancing mass. 4 Case was discussed on breast tumor board on 05/21/2017 and the panel agree with neoadjuvant ddAC to T followed by surgery. Clinically cT3N1M0, Stage IIA  # Genetic testing Testing did not reveal a  pathogenic mutation in any of the genes analyzed. A copy of the genetic test report will be scanned into Epic under the Media tab.The genes analyzed were the 83 genes on [facility]'s Multi-Cancer panel (ALK, APC, ATM, AXIN2, BAP1, BARD1, BLM, BMPR1A, BRCA1, BRCA2, BRIP1, CASR, CDC73, CDH1, CDK4, CDKN1B, CDKN1C, CDKN2A, CEBPA, CHEK2, CTNNA1, DICER1, DIS3L2, EGFR, EPCAM, FH, FLCN, GATA2, GPC3, GREM1, HOXB13, HRAS, KIT, MAX, MEN1, MET, MITF, MLH1, MSH2, MSH3, MSH6, MUTYH, NBN, NF1, NF2, NTHL1, PALB2, PDGFRA, PHOX2B, PMS2, POLD1, POLE, POT1, PRKAR1A, PTCH1, PTEN, RAD50, RAD51C, RAD51D, RB1, RECQL4, RET, RUNX1, SDHA, SDHAF2, SDHB, SDHC, SDHD, SMAD4, SMARCA4, SMARCB1, SMARCE1, STK11, SUFU, TERC, TERT, TMEM127, TP53, TSC1, TSC2, VHL, WRN, WT1).   #Cancer treatment  Neoadjuvant chemotherapy 05/23/2017- 10/03/2017 patient completed s/p 4 cycles ddAC and weekly taxol x 12, on 10/26/2017 patient underwent that surgery.  Left axillary sentinel lymph node was positive, so Dr. Tamala Julian proceeded with left mastectomy and left axillary lymph node dissection. Pathology showed ypT2(m) pN1a, ER/PR positive, HER2 FISH negative, grade 2, negative margin, invasive mammary carcinoma.  S/p adjuvant RT.  Declined NATALEE Trial Clinical trial for pabociclib and letrozole.   # May 2019 Started on Letrozole  # referred for Medi port removal by Dr.Cintron.    INTERVAL HISTORY Patient presents for follow up for history of breast cancer ypT2 yp N1, s/p neoadjuvant chemotherapy and surgery ad adjuvant RT.  Patient presents for 58-monthfollow-up. Patient had a unilateral mammogram screening on 05/01/2019.  No disease recurrence. Today she is concerned about left mastectomy site nodularity. Otherwise no new complaints. Denies any new bone pain, fever, chills, sore  throat, weight loss or abdominal pain She tolerates letrozole.  Manageable vasomotor symptoms. She recently restarted retired Review of Systems  Constitutional: Negative  for appetite change, chills, fatigue and fever.  HENT:   Negative for hearing loss and voice change.   Eyes: Negative for eye problems.  Respiratory: Negative for chest tightness and cough.   Cardiovascular: Negative for chest pain.  Gastrointestinal: Negative for abdominal distention, abdominal pain and blood in stool.  Endocrine: Positive for hot flashes.  Genitourinary: Negative for difficulty urinating and frequency.   Musculoskeletal: Negative for arthralgias.  Skin: Negative for itching and rash.  Neurological: Negative for extremity weakness.  Hematological: Negative for adenopathy.  Psychiatric/Behavioral: Negative for confusion.  Left mastectomy site "bumps "  MEDICAL HISTORY: Past Medical History:  Diagnosis Date  . Anemia   . Arthritis    HIP-LEFT  . Benign neoplasm of colon   . Breast cancer ([place]) 2018   left breast  . Cancer ([place])   . Diabetes mellitus without complication ([place])   . Genetic testing 01/10/2018   Multi-Cancer panel (83 genes) @ [facility] - No pathogenic mutations detected  . H/O osteopenia   . History of hiatal hernia   . Hypertension   . Personal history of chemotherapy   . Personal history of radiation therapy   . Sleep apnea    USE CPAP  . Uterine cancer [facility])     SURGICAL HISTORY: Past Surgical History:  Procedure Laterality Date  . ABDOMINAL HYSTERECTOMY    . BREAST BIOPSY Left 04/2014   negative  . BREAST EXCISIONAL BIOPSY Left 2015  . CESAREAN SECTION     X2  . COLONOSCOPY WITH PROPOFOL N/A 04/15/2015   Procedure: COLONOSCOPY WITH PROPOFOL;  Surgeon: Hulen Luster, MD;  Location: [facility] ENDOSCOPY;  Service: Gastroenterology;  Laterality: N/A;  . HERNIA REPAIR    . MASTECTOMY Left 10/2017  . MASTECTOMY MODIFIED RADICAL Left 10/26/2017   Procedure: MASTECTOMY MODIFIED RADICAL;  Surgeon: Leonie Green, MD;  Location: [facility] ORS;  Service: General;  Laterality: Left;  [name] MASTECTOMY W/ SENTINEL NODE BIOPSY Left 10/26/2017   Procedure:  MASTECTOMY WITH SENTINEL LYMPH NODE BIOPSY;  Surgeon: Leonie Green, MD;  Location: [facility] ORS;  Service: General;  Laterality: Left;  . PORTACATH PLACEMENT Right 05/22/2017   Procedure: INSERTION PORT-A-CATH;  Surgeon: Leonie Green, MD;  Location: [facility] ORS;  Service: General;  Laterality: Right;    SOCIAL HISTORY: Social History   Socioeconomic History  . Marital status: Married    Spouse name: Not on file  . Number of children: Not on file  . Years of education: Not on file  . Highest education level: Not on file  Occupational History  . Not on file  Social Needs  . Financial resource strain: Not on file  . Food insecurity    Worry: Not on file    Inability: Not on file  . Transportation needs    Medical: Not on file    Non-medical: Not on file  Tobacco Use  . Smoking status: Never Smoker  . Smokeless tobacco: Never Used  Substance and Sexual Activity  . Alcohol use: No  . Drug use: No  . Sexual activity: Not on file  Lifestyle  . Physical activity    Days per week: Not on file    Minutes per session: Not on file  . Stress: Not on file  Relationships  . Social connections    Talks on phone: Not on file  Gets together: Not on file    Attends religious service: Not on file    Active member of club or organization: Not on file    Attends meetings of clubs or organizations: Not on file    Relationship status: Not on file  . Intimate partner violence    Fear of current or ex partner: Not on file    Emotionally abused: Not on file    Physically abused: Not on file    Forced sexual activity: Not on file  Other Topics Concern  . Not on file  Social History Narrative  . Not on file    FAMILY HISTORY Family History  Problem Relation Age of Onset  . Diabetes Mother   . Diabetes Father   . Melanoma Father 49       on finger  . Diabetes Sister   . Pancreatic cancer Paternal Grandfather        deceased 20s  . Breast cancer Other 56       paternal  grandmother's mother    ALLERGIES:  has No Known Allergies.  MEDICATIONS:  Current Outpatient Medications  Medication Sig Dispense Refill  . Biotin 10 MG TABS Take by mouth.    . Calcium Carb-Cholecalciferol (CALCIUM 600-D PO) Take 1 tablet by mouth daily.    . Cinnamon 500 MG capsule Take 1,000 mg by mouth daily.     . [employer] B6 100 MG tablet TAKE 1 TABLET BY MOUTH EVERY DAY 100 tablet 3  . cyanocobalamin 1000 MCG tablet Take 1,000 mcg by mouth daily.    . diphenhydrAMINE (BENADRYL) 25 MG tablet Take 25 mg by mouth every 6 (six) hours as needed for allergies.     [name] FIBER PO Take 2 each by mouth daily.     [name] GLUCOSAMINE-CHONDROITIN PO Take 1 tablet by mouth daily.    [name] letrozole (FEMARA) 2.5 MG tablet Take 1 tablet (2.5 mg total) by mouth daily. 90 tablet 0  . lisinopril-hydrochlorothiazide (PRINZIDE,ZESTORETIC) 20-12.5 MG per tablet Take 1 tablet by mouth daily.    . metFORMIN (GLUCOPHAGE) 500 MG tablet Take 1,000 mg by mouth 2 (two) times daily.     . Multiple Vitamin (MULTIVITAMIN) tablet Take 1 tablet by mouth daily.    . pioglitazone (ACTOS) 15 MG tablet Take 30 mg by mouth daily.   1  . trospium (SANCTURA) 20 MG tablet Take 20 mg by mouth 2 (two) times daily.    [name] loperamide (IMODIUM) 2 MG capsule Take 1 capsule (2 mg total) by mouth See admin instructions. With onset of loose stool, take 84m followed by 241mevery 2 hours until 12 hours have passed without loose bowel movement. Maximum: 16 mg/day (Patient not taking: Reported on 08/29/2018) 120 capsule 1  . mirabegron ER (MYRBETRIQ) 25 MG TB24 tablet Take by mouth.     No current facility-administered medications for this visit.     PHYSICAL EXAMINATION:  ECOG PERFORMANCE STATUS: 1 - Symptomatic but completely ambulatory  Vitals:   08/04/19 1627  BP: (!) 150/77  Pulse: 91  Temp: 98.4 F (36.9 C)    Filed Weights   08/04/19 1627  Weight: 231 lb 12.8 oz (105.1 kg)     Physical Exam  Constitutional: She is oriented to  person, place, and time. No distress.  Obese,   HENT:  Head: Normocephalic and atraumatic.  Nose: Nose normal.  Mouth/Throat: Oropharynx is clear and moist. No oropharyngeal exudate.  Eyes: Pupils are equal, round, and reactive to light.  EOM are normal. No scleral icterus.  Neck: Normal range of motion. Neck supple.  Cardiovascular: Normal rate and regular rhythm.  No murmur heard. Pulmonary/Chest: Effort normal. No respiratory distress. She has no rales. She exhibits no tenderness.  Abdominal: Soft. She exhibits no distension. There is no abdominal tenderness.  Musculoskeletal: Normal range of motion.        General: No edema.  Neurological: She is alert and oriented to person, place, and time. No cranial nerve deficit. She exhibits normal muscle tone. Coordination normal.  Skin: Skin is warm and dry. She is not diaphoretic. No erythema.  Psychiatric: Affect normal.  Breast exam is performed in seating position.  History of left mastectomy.  There are palpable small lumps along mastectomy site.  No evidence of bilateral axillary adenopathy. No palpable mass of right breast.   ECOG 0  LABORATORY DATA: CBC Latest Ref Rng & Units 08/05/2019 05/07/2019 01/15/2019  WBC 4.0 - 10.5 K/uL 8.1 7.5 8.0  Hemoglobin 12.0 - 15.0 g/dL 11.7(L) 12.4 12.3  Hematocrit 36.0 - 46.0 % 36.3 37.0 36.8  Platelets 150 - 400 K/uL 274 287 267   CMP Latest Ref Rng & Units 08/05/2019 05/07/2019 01/15/2019  Glucose 70 - 99 mg/dL 174(H) 170(H) 200(H)  BUN 8 - 23 mg/dL '20 18 20  ' Creatinine 0.44 - 1.00 mg/dL 0.79 0.66 0.81  Sodium 135 - 145 mmol/L 138 137 136  Potassium 3.5 - 5.1 mmol/L 4.1 4.0 4.4  Chloride 98 - 111 mmol/L 101 105 101  CO2 22 - 32 mmol/L '28 26 29  ' Calcium 8.9 - 10.3 mg/dL 9.7 9.7 9.7  Total Protein 6.5 - 8.1 g/dL 7.1 7.3 7.0  Total Bilirubin 0.3 - 1.2 mg/dL 0.4 0.5 0.4  Alkaline Phos 38 - 126 U/L 77 79 83  AST 15 - 41 U/L '16 20 18  ' ALT 0 - 44 U/L '13 16 14   ' PATHOLOGY 10/27/2017  Surgical  Pathology  CASE: [id number]  PATIENT: [facility]  Surgical Pathology Report  SPECIMEN SUBMITTED:  A. Sentinel lymph node #1, left axilla  B. Sentinel lymph nodes #2 and 3, left axilla  C. Breast, left; stitch outer end of skin ellipse, and axillary lymph  nodes  DIAGNOSIS:  A. SENTINEL LYMPH NODE #1, LEFT AXILLA; EXCISION:  - METASTATIC MAMMARY CARCINOMA, ONE LYMPH NODE, 5 MM METASTASIS (1/1).   B. SENTINEL LYMPH NODES #2 AND #3, LEFT AXILLA; EXCISION:  - NEGATIVE FOR MALIGNANCY, THREE LYMPH NODES (0/3).   C. BREAST, LEFT; MODIFIED RADICAL MASTECTOMY:  - INVASIVE MAMMARY CARCINOMA, TWO SITES, SEE SUMMARY BELOW.  - BIOPSY SITE CHANGES AT TWO SITES; ONE MARKER CLIP FOUND.  - METASTATIC MAMMARY CARCINOMA IN TWO OF FOUR AXILLARY LYMPH NODES, WITH  LARGER METASTASIS (8 MM) LOCATED IN NODE WITH MARKER CLIP (2/4).   Comment:  ER, PR, and HER2 were assessed on the previous core biopsies  ([id number], 04/25/2017) with results summarized as follows:  ER IHC (SP1): Both tumors positive, >90%, strong staining  PR IHC (IE2): Both tumors positive, 1:00 tumor 51-90%, mod to strong,  and 4:00 tumor >90% with strong staining  HER2 IHC (HercepTest): Both tumors equivocal, score 2+  HER2 FISH (DAKO IQFISH PharmDX): Both tumors negative (not amplified)   Surgical Pathology Cancer Case Summary  INVASIVE CARCINOMA OF THE BREAST:  Procedure: Total mastectomy  Specimen Laterality: Left  Histologic Type: Invasive carcinoma of no special type  Histologic Grade (Nottingham Histologic Score)       Glandular (Acinar)/Tubular Differentiation: Score 3  Nuclear Pleomorphism: Score 2       Mitotic Rate: Score 1       Overall Grade: Grade 2  Tumor Size: 23 mm (4-5:00 posterior tumor); 12 mm (1:00 tumor)  Ductal Carcinoma In Situ (DCIS): Present  DCIS Nuclear Grade: Intermediate grade  Extensive intraductal component: Negative for extensive intraductal  component   Margins:       Invasive carcinoma margins: Uninvolved by invasive carcinoma       Distance from closest margin: 0.2 mm, deep       DCIS margins: Uninvolved by DCIS       Distance from closest margin: Cannot be determined; margins  not present in sections containing DCIS  Regional Lymph nodes:    Total # lymph nodes examined: 8    # Sentinel lymph nodes examined: 4    # Lymph nodes with macrometastasis (>2.0 mm): 2    # Lymph nodes with isolated tumor cells (<0.2 mm): 0    # Lymph nodes with micrometastasis (> 0.2 mm and < 2.0 mm): 1    Extranodal extension: Present  Treatment Effect:       In the breast: Definite response to presurgical therapy in the invasive carcinoma       In the lymph nodes: No definite response to presurgical therapy in metastatic carcinoma  Residual Cancer Burden (RCB):    Primary Tumor Bed  Primary tumor bed:23 mm x 11 mm    Overall cancer cellularity: 0.5%    Percentage of cancer that is in situ: 0%     Lymph nodes    # of lymph nodes positive for metastasis: 3    Diameter of largest metastasis: 8 mm    Residual Cancer Burden (RCB): 2.553       Residual Cancer Burden Class: RCB-II  Lymphovascular Invasion: Cannot be determined with extensive retraction  changes  Pathologic Stage Classification (pTNM, AJCC 8th Edition): ypT2(m) pN1a  TNM Descriptors: y (posttreatment); m (multiple foci of invasive  carcinoma)   RADIOGRAPHIC STUDIES: I have personally reviewed the radiological images as listed and agreed with the findings in the report. # 05/02/2018 Unilateral right breast diagnostic mammogram and ultrasound were obtained and was negative.   ASSESSMENT/PLAN 68 y.o. female presents with cT3cN1cMo 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  S.p post left modified  radical mastectomy with ALND, s/p radiation.   1. Malignant neoplasm of upper-outer quadrant of left breast in female, estrogen receptor positive (Dominique Guzman)   2. Aromatase inhibitor use   3. Osteopenia, unspecified location   4. Anemia, unspecified type    # ypT2N1a M0 disease, ER+PR+, HER2-. Residual disease after neoadjuvant chemotherapy s/p RT/  Tolerates letrozole 2.5 mg daily.  Manageable hot flash. Continue letrozole.  Plan total of 10 years.  Discussed. Continue annual screening mammogram of right breast.  Mastectomy lumps, physical examination was discussed with patient.  Most likely buildup of scar tissue or seroma. I will refer patient back to Dr. Peyton Najjar for further evaluation and assessment.  Discussed with Dr. Peyton Najjar via secure chat.  Patient will have a follow-up appointment with him.  # Osteopenia, on Zometa Q 72m  Zometa is due January 2021 proceed with Zometa today.   Recommend continue calcium and vitamin D supplementation. #Slight anemia, hemoglobin 11.7 continue to monitor.  Follow up in 3 months. ZEarlie Server MD, PhD [facility]ematology [facility]  [facility] 08/07/2019

## 2019-08-12 ENCOUNTER — Other Ambulatory Visit: Payer: Self-pay | Admitting: General Surgery

## 2019-08-12 DIAGNOSIS — R590 Localized enlarged lymph nodes: Secondary | ICD-10-CM

## 2019-08-15 ENCOUNTER — Ambulatory Visit
Admission: RE | Admit: 2019-08-15 | Discharge: 2019-08-15 | Disposition: A | Discharge status: Home / Self Care | Payer: Medicare HMO | Source: Ambulatory Visit | Attending: General Surgery | Admitting: General Surgery

## 2019-08-15 DIAGNOSIS — R590 Localized enlarged lymph nodes: Secondary | ICD-10-CM | POA: Diagnosis not present

## 2019-09-10 ENCOUNTER — Other Ambulatory Visit: Payer: Self-pay

## 2019-09-11 ENCOUNTER — Ambulatory Visit
Admission: RE | Admit: 2019-09-11 | Discharge: 2019-09-11 | Disposition: A | Discharge status: Home / Self Care | Payer: Medicare HMO | Source: Ambulatory Visit | Attending: Radiation Oncology | Admitting: Radiation Oncology

## 2019-09-11 ENCOUNTER — Other Ambulatory Visit: Payer: Self-pay

## 2019-09-11 ENCOUNTER — Encounter: Payer: Self-pay | Admitting: Radiation Oncology

## 2019-09-11 VITALS — BP 129/81 | HR 99 | Temp 97.0°F | Resp 18 | Wt 230.3 lb

## 2019-09-11 DIAGNOSIS — Z79811 Long term (current) use of aromatase inhibitors: Secondary | ICD-10-CM | POA: Insufficient documentation

## 2019-09-11 DIAGNOSIS — Z923 Personal history of irradiation: Secondary | ICD-10-CM | POA: Diagnosis not present

## 2019-09-11 DIAGNOSIS — C50812 Malignant neoplasm of overlapping sites of left female breast: Secondary | ICD-10-CM | POA: Insufficient documentation

## 2019-09-11 DIAGNOSIS — Z9012 Acquired absence of left breast and nipple: Secondary | ICD-10-CM | POA: Insufficient documentation

## 2019-09-11 DIAGNOSIS — Z170 Estrogen receptor positive status [ER+]: Secondary | ICD-10-CM | POA: Diagnosis not present

## 2019-09-11 NOTE — Progress Notes (Signed)
Radiation Oncology Follow up Note  Name: Dominique Guzman   Date:   09/11/2019 MRN:  [id number] DOB: [birth year]    This 68 y.o. female presents to the clinic today for 74-monthfollow-up status post left chest wall peripheral emphatic radiation therapy for a T2N1 ER/PR positive HER-2 negative invasive mammary carcinoma status post left modified radical mastectomy.  REFERRING PROVIDER: SGlendon Axe MD  HPI: Patient is a 68year old female now at 18 months having completed left chest wall peripheral emphatic radiation for a T2N1 ER/PR positive HER-2 negative invasive mammary carcinoma of the left breast status post left modified radical mastectomy.  Seen today in routine follow-up she is doing well..  Right breast mammogram back in July was BI-RADS 1 negative for malignancy.  She did feel some nodularity in her mastectomy scar ultrasound this month showed no suspicious palpable or sonographic abnormalities.  She specifically denies chest wall pain any swelling of her left upper extremity or any mass or nodularity in her right breast.  She specifically denies bone pain or cough.  Patient is currently on letrozole tolerating that well without side effects.  COMPLICATIONS OF TREATMENT: none  FOLLOW UP COMPLIANCE: keeps appointments   PHYSICAL EXAM:  BP 129/81   Pulse 99   Temp (!) 97 F (36.1 C)   Resp 18   Wt 230 lb 4.8 oz (104.5 kg)   LMP  (LMP Unknown)   BMI 48.13 kg/m  Patient is status post left modified radical mastectomy I detect nothing but scar tissue in her mastectomy site right breast is free of mass or nodularity in 2 positions examined.  No axillary or supraclavicular adenopathy is identified no lymphedema of her left upper extremity is noted.  Well-developed well-nourished patient in NAD. HEENT reveals PERLA, EOMI, discs not visualized.  Oral cavity is clear. No oral mucosal lesions are identified. Neck is clear without evidence of cervical or supraclavicular adenopathy. Lungs  are clear to A&P. Cardiac examination is essentially unremarkable with regular rate and rhythm without murmur rub or thrill. Abdomen is benign with no organomegaly or masses noted. Motor sensory and DTR levels are equal and symmetric in the upper and lower extremities. Cranial nerves II through XII are grossly intact. Proprioception is intact. No peripheral adenopathy or edema is identified. No motor or sensory levels are noted. Crude visual fields are within normal range.  RADIOLOGY RESULTS: Ultrasound of her chest wall as well as mammogram of her right breast both reviewed compatible with above-stated findings  PLAN: Present time patient continues to do well with no evidence of disease.  I am pleased with her overall progress.  I have asked to see her back in 1 year for follow-up.  She continues on letrozole without side effect.  Patient knows to call at anytime with any concerns.  I would like to take this opportunity to thank you for allowing me to participate in the care of your patient..Noreene Filbert MD

## 2019-09-30 ENCOUNTER — Other Ambulatory Visit: Payer: Self-pay | Admitting: Oncology

## 2019-11-03 ENCOUNTER — Other Ambulatory Visit: Payer: Self-pay

## 2019-11-03 ENCOUNTER — Inpatient Hospital Stay: Payer: Medicare HMO

## 2019-11-03 DIAGNOSIS — I10 Essential (primary) hypertension: Secondary | ICD-10-CM | POA: Insufficient documentation

## 2019-11-03 DIAGNOSIS — Z800 Family history of malignant neoplasm of digestive organs: Secondary | ICD-10-CM | POA: Diagnosis not present

## 2019-11-03 DIAGNOSIS — E119 Type 2 diabetes mellitus without complications: Secondary | ICD-10-CM | POA: Insufficient documentation

## 2019-11-03 DIAGNOSIS — G4730 Sleep apnea, unspecified: Secondary | ICD-10-CM | POA: Diagnosis not present

## 2019-11-03 DIAGNOSIS — R232 Flushing: Secondary | ICD-10-CM | POA: Diagnosis not present

## 2019-11-03 DIAGNOSIS — Z79811 Long term (current) use of aromatase inhibitors: Secondary | ICD-10-CM | POA: Diagnosis not present

## 2019-11-03 DIAGNOSIS — C50412 Malignant neoplasm of upper-outer quadrant of left female breast: Secondary | ICD-10-CM

## 2019-11-03 DIAGNOSIS — Z7984 Long term (current) use of oral hypoglycemic drugs: Secondary | ICD-10-CM | POA: Diagnosis not present

## 2019-11-03 DIAGNOSIS — M8580 Other specified disorders of bone density and structure, unspecified site: Secondary | ICD-10-CM | POA: Diagnosis present

## 2019-11-03 DIAGNOSIS — Z79899 Other long term (current) drug therapy: Secondary | ICD-10-CM | POA: Insufficient documentation

## 2019-11-03 DIAGNOSIS — Z833 Family history of diabetes mellitus: Secondary | ICD-10-CM | POA: Insufficient documentation

## 2019-11-03 DIAGNOSIS — Z170 Estrogen receptor positive status [ER+]: Secondary | ICD-10-CM | POA: Insufficient documentation

## 2019-11-03 DIAGNOSIS — Z803 Family history of malignant neoplasm of breast: Secondary | ICD-10-CM | POA: Diagnosis not present

## 2019-11-03 LAB — CBC WITH DIFFERENTIAL/PLATELET
Abs Immature Granulocytes: 0.09 10*3/uL — ABNORMAL HIGH (ref 0.00–0.07)
Basophils Absolute: 0.1 10*3/uL (ref 0.0–0.1)
Basophils Relative: 1 %
Eosinophils Absolute: 0.5 10*3/uL (ref 0.0–0.5)
Eosinophils Relative: 5 %
HCT: 38.4 % (ref 36.0–46.0)
Hemoglobin: 12.2 g/dL (ref 12.0–15.0)
Immature Granulocytes: 1 %
Lymphocytes Relative: 18 %
Lymphs Abs: 1.5 10*3/uL (ref 0.7–4.0)
MCH: 28.3 pg (ref 26.0–34.0)
MCHC: 31.8 g/dL (ref 30.0–36.0)
MCV: 89.1 fL (ref 80.0–100.0)
Monocytes Absolute: 0.8 10*3/uL (ref 0.1–1.0)
Monocytes Relative: 9 %
Neutro Abs: 5.8 10*3/uL (ref 1.7–7.7)
Neutrophils Relative %: 66 %
Platelets: 279 10*3/uL (ref 150–400)
RBC: 4.31 MIL/uL (ref 3.87–5.11)
RDW: 13.2 % (ref 11.5–15.5)
WBC: 8.7 10*3/uL (ref 4.0–10.5)
nRBC: 0 % (ref 0.0–0.2)

## 2019-11-03 LAB — COMPREHENSIVE METABOLIC PANEL
ALT: 15 U/L (ref 0–44)
AST: 19 U/L (ref 15–41)
Albumin: 3.8 g/dL (ref 3.5–5.0)
Alkaline Phosphatase: 84 U/L (ref 38–126)
Anion gap: 9 (ref 5–15)
BUN: 23 mg/dL (ref 8–23)
CO2: 28 mmol/L (ref 22–32)
Calcium: 9.8 mg/dL (ref 8.9–10.3)
Chloride: 99 mmol/L (ref 98–111)
Creatinine, Ser: 0.82 mg/dL (ref 0.44–1.00)
GFR calc Af Amer: >60 mL/min (ref >60)
GFR calc non Af Amer: >60 mL/min (ref >60)
Glucose, Bld: 194 mg/dL — ABNORMAL HIGH (ref 70–99)
Potassium: 4.5 mmol/L (ref 3.5–5.1)
Sodium: 136 mmol/L (ref 135–145)
Total Bilirubin: 0.5 mg/dL (ref 0.3–1.2)
Total Protein: 7.2 g/dL (ref 6.5–8.1)

## 2019-11-04 ENCOUNTER — Other Ambulatory Visit: Payer: Medicare HMO

## 2019-11-04 ENCOUNTER — Encounter: Payer: Self-pay | Admitting: Oncology

## 2019-11-04 ENCOUNTER — Inpatient Hospital Stay: Payer: Medicare HMO | Attending: Oncology | Admitting: Oncology

## 2019-11-04 DIAGNOSIS — Z79811 Long term (current) use of aromatase inhibitors: Secondary | ICD-10-CM

## 2019-11-04 DIAGNOSIS — C50412 Malignant neoplasm of upper-outer quadrant of left female breast: Secondary | ICD-10-CM | POA: Diagnosis not present

## 2019-11-04 DIAGNOSIS — M8580 Other specified disorders of bone density and structure, unspecified site: Secondary | ICD-10-CM | POA: Diagnosis not present

## 2019-11-04 DIAGNOSIS — Z170 Estrogen receptor positive status [ER+]: Secondary | ICD-10-CM

## 2019-11-04 NOTE — Progress Notes (Signed)
Patient verified using two identifiers for virtual visit via telephone today.  Patient does not offer any problems today.  

## 2019-11-05 NOTE — Progress Notes (Signed)
HEMATOLOGY-ONCOLOGY TeleHEALTH VISIT PROGRESS NOTE  I connected with Dominique Guzman on 11/05/19 at  2:45 PM EST by video enabled telemedicine visit and verified that I am speaking with the correct person using two identifiers. I discussed the limitations, risks, security and privacy concerns of performing an evaluation and management service by telemedicine and the availability of in-person appointments. I also discussed with the patient that there may be a patient responsible charge related to this service. The patient expressed understanding and agreed to proceed.   Other persons participating in the visit and their role in the encounter:  None  Patient's location: Home  Provider's location: office Chief Complaint: Follow-up for breast cancer   INTERVAL HISTORY Dominique Guzman is a 69 y.o. female who has above history reviewed by me today presents for follow up visit for management of breast cancer Problems and complaints are listed below:  Patient has been taking letrozole 2.5 mg daily.  She reports tolerating it.  Manageable hot flashes and joint pains.  No new complaints.  She denies any concerns of her mastectomy site and right breast.  Review of Systems  Constitutional: Negative for appetite change, chills, fatigue and fever.  HENT:   Negative for hearing loss and voice change.   Eyes: Negative for eye problems.  Respiratory: Negative for chest tightness and cough.   Cardiovascular: Negative for chest pain.  Gastrointestinal: Negative for abdominal distention, abdominal pain and blood in stool.  Endocrine: Negative for hot flashes.  Genitourinary: Negative for difficulty urinating and frequency.   Musculoskeletal: Negative for arthralgias.  Skin: Negative for itching and rash.  Neurological: Negative for extremity weakness.  Hematological: Negative for adenopathy.  Psychiatric/Behavioral: Negative for confusion.    Past Medical History:  Diagnosis Date  . Anemia   .  Arthritis    HIP-LEFT  . Benign neoplasm of colon   . Breast cancer ([place]) 2018   left breast  . Cancer ([place])   . Diabetes mellitus without complication ([place])   . Genetic testing 01/10/2018   Multi-Cancer panel (83 genes) @ [facility] - No pathogenic mutations detected  . H/O osteopenia   . History of hiatal hernia   . Hypertension   . Personal history of chemotherapy   . Personal history of radiation therapy   . Sleep apnea    USE CPAP  . Uterine cancer [facility])    Past Surgical History:  Procedure Laterality Date  . ABDOMINAL HYSTERECTOMY    . BREAST BIOPSY Left 04/2014   negative  . BREAST EXCISIONAL BIOPSY Left 2015  . CESAREAN SECTION     X2  . COLONOSCOPY WITH PROPOFOL N/A 04/15/2015   Procedure: COLONOSCOPY WITH PROPOFOL;  Surgeon: Hulen Luster, MD;  Location: [facility] ENDOSCOPY;  Service: Gastroenterology;  Laterality: N/A;  . HERNIA REPAIR    . MASTECTOMY Left 10/2017  . MASTECTOMY MODIFIED RADICAL Left 10/26/2017   Procedure: MASTECTOMY MODIFIED RADICAL;  Surgeon: Leonie Green, MD;  Location: [facility] ORS;  Service: General;  Laterality: Left;  [name] MASTECTOMY W/ SENTINEL NODE BIOPSY Left 10/26/2017   Procedure: MASTECTOMY WITH SENTINEL LYMPH NODE BIOPSY;  Surgeon: Leonie Green, MD;  Location: [facility] ORS;  Service: General;  Laterality: Left;  . PORTACATH PLACEMENT Right 05/22/2017   Procedure: INSERTION PORT-A-CATH;  Surgeon: Leonie Green, MD;  Location: [facility] ORS;  Service: General;  Laterality: Right;    Family History  Problem Relation Age of Onset  . Diabetes Mother   . Diabetes Father   .  Melanoma Father 53       on finger  . Diabetes Sister   . Pancreatic cancer Paternal Grandfather        deceased 74s  . Breast cancer Other 26       paternal grandmother's mother    Social History   Socioeconomic History  . Marital status: Married    Spouse name: Not on file  . Number of children: Not on file  . Years of education: Not on file  . Highest education  level: Not on file  Occupational History  . Not on file  Tobacco Use  . Smoking status: Never Smoker  . Smokeless tobacco: Never Used  Substance and Sexual Activity  . Alcohol use: No  . Drug use: No  . Sexual activity: Not on file  Other Topics Concern  . Not on file  Social History Narrative  . Not on file   Social Determinants of Health   Financial Resource Strain:   . Difficulty of Paying Living Expenses: Not on file  Food Insecurity:   . Worried About Charity fundraiser in the Last Year: Not on file  . Ran Out of Food in the Last Year: Not on file  Transportation Needs:   . Lack of Transportation (Medical): Not on file  . Lack of Transportation (Non-Medical): Not on file  Physical Activity:   . Days of Exercise per Week: Not on file  . Minutes of Exercise per Session: Not on file  Stress:   . Feeling of Stress : Not on file  Social Connections:   . Frequency of Communication with Friends and Family: Not on file  . Frequency of Social Gatherings with Friends and Family: Not on file  . Attends Religious Services: Not on file  . Active Member of Clubs or Organizations: Not on file  . Attends Archivist Meetings: Not on file  . Marital Status: Not on file  Intimate Partner Violence:   . Fear of Current or Ex-Partner: Not on file  . Emotionally Abused: Not on file  . Physically Abused: Not on file  . Sexually Abused: Not on file    Current Outpatient Medications on File Prior to Visit  Medication Sig Dispense Refill  . Biotin 10 MG TABS Take by mouth.    . Calcium Carb-Cholecalciferol (CALCIUM 600-D PO) Take 1 tablet by mouth daily.    . Cinnamon 500 MG capsule Take 1,000 mg by mouth daily.     . [employer] B6 100 MG tablet TAKE 1 TABLET BY MOUTH EVERY DAY 100 tablet 3  . cyanocobalamin 1000 MCG tablet Take 1,000 mcg by mouth daily.    . diphenhydrAMINE (BENADRYL) 25 MG tablet Take 25 mg by mouth every 6 (six) hours as needed for allergies.     [name] FIBER PO Take 2  each by mouth daily.     [name] GLUCOSAMINE-CHONDROITIN PO Take 1 tablet by mouth daily.    [name] letrozole (FEMARA) 2.5 MG tablet TAKE 1 TABLET BY MOUTH EVERY DAY 90 tablet 0  . lisinopril-hydrochlorothiazide (PRINZIDE,ZESTORETIC) 20-12.5 MG per tablet Take 1 tablet by mouth daily.    [name] loperamide (IMODIUM) 2 MG capsule Take 1 capsule (2 mg total) by mouth See admin instructions. With onset of loose stool, take 81m followed by 24mevery 2 hours until 12 hours have passed without loose bowel movement. Maximum: 16 mg/day (Patient taking differently: Take 2 mg by mouth See admin instructions. PRN With onset of loose stool, take  75m followed by 269mevery 2 hours until 12 hours have passed without loose bowel movement. Maximum: 16 mg/day) 120 capsule 1  . metFORMIN (GLUCOPHAGE) 500 MG tablet Take 1,000 mg by mouth 2 (two) times daily.     . mirabegron ER (MYRBETRIQ) 25 MG TB24 tablet Take by mouth.    . Multiple Vitamin (MULTIVITAMIN) tablet Take 1 tablet by mouth daily.    . pioglitazone (ACTOS) 30 MG tablet Take 30 mg by mouth daily.    . rosuvastatin (CRESTOR) 5 MG tablet 1 tab three times a week.    . trospium (SANCTURA) 20 MG tablet Take 20 mg by mouth 2 (two) times daily.     No current facility-administered medications on file prior to visit.    No Known Allergies     Observations/Objective: Today's Vitals   11/04/19 1444  PainSc: 0-No pain   There is no height or weight on file to calculate BMI.  Physical Exam  Constitutional: No distress.  Neurological: She is alert.  Psychiatric: Mood normal.    CBC    Component Value Date/Time   WBC 8.7 11/03/2019 1103   RBC 4.31 11/03/2019 1103   HGB 12.2 11/03/2019 1103   HCT 38.4 11/03/2019 1103   PLT 279 11/03/2019 1103   MCV 89.1 11/03/2019 1103   MCH 28.3 11/03/2019 1103   MCHC 31.8 11/03/2019 1103   RDW 13.2 11/03/2019 1103   LYMPHSABS 1.5 11/03/2019 1103   MONOABS 0.8 11/03/2019 1103   EOSABS 0.5 11/03/2019 1103   BASOSABS 0.1  11/03/2019 1103    CMP     Component Value Date/Time   NA 136 11/03/2019 1103   K 4.5 11/03/2019 1103   CL 99 11/03/2019 1103   CO2 28 11/03/2019 1103   GLUCOSE 194 (H) 11/03/2019 1103   BUN 23 11/03/2019 1103   CREATININE 0.82 11/03/2019 1103   CALCIUM 9.8 11/03/2019 1103   PROT 7.2 11/03/2019 1103   ALBUMIN 3.8 11/03/2019 1103   AST 19 11/03/2019 1103   ALT 15 11/03/2019 1103   ALKPHOS 84 11/03/2019 1103   BILITOT 0.5 11/03/2019 1103   GFRNONAA >60 11/03/2019 1103   GFRAA >60 11/03/2019 1103     Assessment and Plan: 1. Malignant neoplasm of upper-outer quadrant of left breast in female, estrogen receptor positive ([place]  2. Osteopenia, unspecified location   3. Aromatase inhibitor use     # ypT2N1a M0 disease, ER+PR+, HER2-. Residual disease after neoadjuvant chemotherapy s/p RT Patient tolerates letrozole 2.5 mg daily.  Manageable hot flash. Continue letrozole.  Plan for total 10 years. Continue annual screening mammogram of the right breast.  She is due in July 2021.  I will obtain.  #Osteopenia in the setting of chronic aromatase inhibitor use., on Zometa every 6 months.  She is due in January 2021 for another dose of Zometa.  I will arrange.  Continue calcium and vitamin D supplementation Obtain bone density in May 2021.  Follow Up Instructions: 6 months   I discussed the assessment and treatment plan with the patient. The patient was provided an opportunity to ask questions and all were answered. The patient agreed with the plan and demonstrated an understanding of the instructions.  The patient was advised to call back or seek an in-person evaluation if the symptoms worsen or if the condition fails to improve as anticipated.   . Earlie ServerMD 11/05/2019 1:00 PM

## 2019-11-06 ENCOUNTER — Other Ambulatory Visit: Payer: Self-pay

## 2019-11-06 ENCOUNTER — Inpatient Hospital Stay: Payer: Medicare HMO

## 2019-11-06 VITALS — BP 139/84 | HR 99 | Temp 99.1°F | Resp 18

## 2019-11-06 DIAGNOSIS — E860 Dehydration: Secondary | ICD-10-CM

## 2019-11-06 DIAGNOSIS — M8580 Other specified disorders of bone density and structure, unspecified site: Secondary | ICD-10-CM | POA: Diagnosis not present

## 2019-11-06 MED ORDER — ZOLEDRONIC ACID 4 MG/100ML IV SOLN
4.0000 mg | Freq: Once | INTRAVENOUS | Status: AC
  Administered 2019-11-06: 4 mg via INTRAVENOUS
  Filled 2019-11-06: qty 100

## 2019-11-06 MED ORDER — SODIUM CHLORIDE 0.9 % IV SOLN
Freq: Once | INTRAVENOUS | Status: AC
  Filled 2019-11-06: qty 250

## 2019-11-08 ENCOUNTER — Encounter: Payer: Self-pay | Admitting: Oncology

## 2019-11-10 ENCOUNTER — Telehealth: Payer: Self-pay

## 2019-11-10 NOTE — Telephone Encounter (Signed)
Contacted patient and she reports having last Dexa done at [facility] clinic(ordered by Dr. Peyton Najjar). Asked her if she had a preference on whether to have it at [place] or [place]. She was ok with having it done at [facility], she wanted to make sure we knew that her previous one was done at [facility].

## 2019-11-29 ENCOUNTER — Ambulatory Visit: Payer: Medicare HMO | Attending: Internal Medicine

## 2019-11-29 DIAGNOSIS — Z23 Encounter for immunization: Secondary | ICD-10-CM | POA: Insufficient documentation

## 2019-11-29 NOTE — Progress Notes (Signed)
   Covid-19 Vaccination Clinic  Name:  Dominique Guzman    MRN: [id number] DOB: May 03, 1951  11/29/2019  [name] was observed post Covid-19 immunization for 15 minutes without incidence. She was provided with Vaccine Information Sheet and instruction to access the V-Safe system.   Ms. Suite was instructed to call 911 with any severe reactions post vaccine: [name] Difficulty breathing  . Swelling of your face and throat  . A fast heartbeat  . A bad rash all over your body  . Dizziness and weakness    Immunizations Administered    Name Date Dose VIS Date Route   Pfizer COVID-19 Vaccine 11/29/2019  9:38 AM 0.3 mL 09/19/2019 Intramuscular   Manufacturer: [place]   Lot: [id number]   [place]: [id number]

## 2019-12-22 ENCOUNTER — Other Ambulatory Visit: Payer: Self-pay | Admitting: Oncology

## 2019-12-23 ENCOUNTER — Ambulatory Visit: Payer: Medicare HMO | Attending: Internal Medicine

## 2019-12-23 DIAGNOSIS — Z23 Encounter for immunization: Secondary | ICD-10-CM

## 2019-12-23 NOTE — Progress Notes (Signed)
   Covid-19 Vaccination Clinic  Name:  UVA BORSETH    MRN: [id number] DOB: 1951/06/16  12/23/2019  Ms. Doolan was observed post Covid-19 immunization for 15 minutes without incident. She was provided with Vaccine Information Sheet and instruction to access the V-Safe system.   Ms. Yearsley was instructed to call 911 with any severe reactions post vaccine: [name] Difficulty breathing  . Swelling of face and throat  . A fast heartbeat  . A bad rash all over body  . Dizziness and weakness   Immunizations Administered    Name Date Dose VIS Date Route   Pfizer COVID-19 Vaccine 12/23/2019  9:40 AM 0.3 mL 09/19/2019 Intramuscular   Manufacturer: [place]   Lot: [id number]   [place]: [id number]

## 2020-03-03 ENCOUNTER — Ambulatory Visit
Admission: RE | Admit: 2020-03-03 | Discharge: 2020-03-03 | Disposition: A | Discharge status: Home / Self Care | Payer: Medicare HMO | Source: Ambulatory Visit | Attending: Oncology | Admitting: Oncology

## 2020-03-03 DIAGNOSIS — Z170 Estrogen receptor positive status [ER+]: Secondary | ICD-10-CM | POA: Diagnosis present

## 2020-03-03 DIAGNOSIS — M85851 Other specified disorders of bone density and structure, right thigh: Secondary | ICD-10-CM | POA: Diagnosis not present

## 2020-03-03 DIAGNOSIS — C50412 Malignant neoplasm of upper-outer quadrant of left female breast: Secondary | ICD-10-CM | POA: Diagnosis not present

## 2020-05-05 ENCOUNTER — Other Ambulatory Visit: Payer: Medicare HMO

## 2020-05-05 ENCOUNTER — Ambulatory Visit
Admission: RE | Admit: 2020-05-05 | Discharge: 2020-05-05 | Disposition: A | Discharge status: Home / Self Care | Payer: Medicare HMO | Source: Ambulatory Visit | Attending: Oncology | Admitting: Oncology

## 2020-05-05 DIAGNOSIS — C50412 Malignant neoplasm of upper-outer quadrant of left female breast: Secondary | ICD-10-CM

## 2020-05-05 DIAGNOSIS — Z1231 Encounter for screening mammogram for malignant neoplasm of breast: Secondary | ICD-10-CM | POA: Insufficient documentation

## 2020-05-10 ENCOUNTER — Encounter: Payer: Self-pay | Admitting: Oncology

## 2020-05-10 ENCOUNTER — Inpatient Hospital Stay: Payer: Medicare HMO | Attending: Oncology

## 2020-05-10 ENCOUNTER — Other Ambulatory Visit: Payer: Self-pay

## 2020-05-10 ENCOUNTER — Inpatient Hospital Stay (HOSPITAL_BASED_OUTPATIENT_CLINIC_OR_DEPARTMENT_OTHER): Payer: Medicare HMO | Admitting: Oncology

## 2020-05-10 VITALS — BP 141/84 | HR 91 | Temp 99.0°F | Resp 18 | Wt 229.9 lb

## 2020-05-10 DIAGNOSIS — Z9221 Personal history of antineoplastic chemotherapy: Secondary | ICD-10-CM | POA: Diagnosis not present

## 2020-05-10 DIAGNOSIS — Z79899 Other long term (current) drug therapy: Secondary | ICD-10-CM | POA: Diagnosis not present

## 2020-05-10 DIAGNOSIS — G4730 Sleep apnea, unspecified: Secondary | ICD-10-CM | POA: Insufficient documentation

## 2020-05-10 DIAGNOSIS — M8580 Other specified disorders of bone density and structure, unspecified site: Secondary | ICD-10-CM | POA: Insufficient documentation

## 2020-05-10 DIAGNOSIS — C50412 Malignant neoplasm of upper-outer quadrant of left female breast: Secondary | ICD-10-CM | POA: Diagnosis present

## 2020-05-10 DIAGNOSIS — Z9012 Acquired absence of left breast and nipple: Secondary | ICD-10-CM | POA: Insufficient documentation

## 2020-05-10 DIAGNOSIS — Z7984 Long term (current) use of oral hypoglycemic drugs: Secondary | ICD-10-CM | POA: Diagnosis not present

## 2020-05-10 DIAGNOSIS — E119 Type 2 diabetes mellitus without complications: Secondary | ICD-10-CM | POA: Insufficient documentation

## 2020-05-10 DIAGNOSIS — Z170 Estrogen receptor positive status [ER+]: Secondary | ICD-10-CM | POA: Diagnosis not present

## 2020-05-10 DIAGNOSIS — Z923 Personal history of irradiation: Secondary | ICD-10-CM | POA: Insufficient documentation

## 2020-05-10 DIAGNOSIS — I10 Essential (primary) hypertension: Secondary | ICD-10-CM | POA: Diagnosis not present

## 2020-05-10 DIAGNOSIS — Z79811 Long term (current) use of aromatase inhibitors: Secondary | ICD-10-CM | POA: Diagnosis not present

## 2020-05-10 LAB — CBC WITH DIFFERENTIAL/PLATELET
Abs Immature Granulocytes: 0.08 10*3/uL — ABNORMAL HIGH (ref 0.00–0.07)
Basophils Absolute: 0 10*3/uL (ref 0.0–0.1)
Basophils Relative: 1 %
Eosinophils Absolute: 0.4 10*3/uL (ref 0.0–0.5)
Eosinophils Relative: 5 %
HCT: 36.6 % (ref 36.0–46.0)
Hemoglobin: 12.2 g/dL (ref 12.0–15.0)
Immature Granulocytes: 1 %
Lymphocytes Relative: 17 %
Lymphs Abs: 1.3 10*3/uL (ref 0.7–4.0)
MCH: 28.3 pg (ref 26.0–34.0)
MCHC: 33.3 g/dL (ref 30.0–36.0)
MCV: 84.9 fL (ref 80.0–100.0)
Monocytes Absolute: 0.7 10*3/uL (ref 0.1–1.0)
Monocytes Relative: 9 %
Neutro Abs: 5.2 10*3/uL (ref 1.7–7.7)
Neutrophils Relative %: 67 %
Platelets: 309 10*3/uL (ref 150–400)
RBC: 4.31 MIL/uL (ref 3.87–5.11)
RDW: 14.2 % (ref 11.5–15.5)
WBC: 7.6 10*3/uL (ref 4.0–10.5)
nRBC: 0 % (ref 0.0–0.2)

## 2020-05-10 LAB — COMPREHENSIVE METABOLIC PANEL
ALT: 17 U/L (ref 0–44)
AST: 20 U/L (ref 15–41)
Albumin: 3.9 g/dL (ref 3.5–5.0)
Alkaline Phosphatase: 60 U/L (ref 38–126)
Anion gap: 10 (ref 5–15)
BUN: 13 mg/dL (ref 8–23)
CO2: 25 mmol/L (ref 22–32)
Calcium: 9.3 mg/dL (ref 8.9–10.3)
Chloride: 104 mmol/L (ref 98–111)
Creatinine, Ser: 0.75 mg/dL (ref 0.44–1.00)
GFR calc Af Amer: >60 mL/min (ref >60)
GFR calc non Af Amer: >60 mL/min (ref >60)
Glucose, Bld: 187 mg/dL — ABNORMAL HIGH (ref 70–99)
Potassium: 3.7 mmol/L (ref 3.5–5.1)
Sodium: 139 mmol/L (ref 135–145)
Total Bilirubin: 0.7 mg/dL (ref 0.3–1.2)
Total Protein: 7.1 g/dL (ref 6.5–8.1)

## 2020-05-10 MED ORDER — LETROZOLE 2.5 MG PO TABS: 2.5000 mg | ORAL_TABLET | Freq: Every day | ORAL | 1 refills | Status: DC

## 2020-05-10 NOTE — Addendum Note (Signed)
Addended by: Earlie Server on: 05/10/2020 08:17 PM   Modules accepted: Orders

## 2020-05-10 NOTE — Progress Notes (Signed)
[facility] Follow Up Visit.  Patient Care Team: Tracie Harrier, MD as PCP - General (Internal Medicine) Rico Junker, RN as Oncology Nurse Navigator Earlie Server, MD as Consulting Physician (Oncology) Leonie Green, MD as Referring Physician (Surgery) Noreene Filbert, MD as Referring Physician (Radiation Oncology)  Reason for visit Follow up for management of Stage IIA left breast cancer.   Pertinent Oncology History OREL HORD 69 y.o. female with PMH listed as below is referred by Dr.Schermerhorn to [name] for evaluation and management of newly diagnosed breast cancer. Patient had a history a left breast mass biopsy followed by breast excisonal biopsy in 2015 with benign pathology.  She also has a history of Uterine cancer which was treated with RT with Dr.Chrystal.  She underwent routine screening mammogram on 04/17/2017 which recommended a possible mass on the left breast. Left diagnostic mammogram showed a highly suspicious left breast mass at 4 o'clock, 7 cm from the nipple and 1 o'clock,,  About 3 cm from the nipple. Enlarged left axillary lymph node suspicious for metastatic disease.  1 US breast confirmed a left breast mass of 2.1cm, 4 o'clock, and 1.1 cm mass at 1 o'clock.The masses at 1 and 4 o'clock are separated by approximately 4 cm sonographically 2 Biopsy of the two mass and axillary lymph node revealed invasive carcinoma, grade 2. Both are ER,PR positive. HER2 equivocal, FISH pending. 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  3 05/14/2017 MRI breast showed Two sites of biopsy proven malignancy seen in the left breast, which span up to 11.3 cm. There are intervening areas of non mass enhancement and a small suspicious enhancing mass. 4 Case was discussed on breast tumor board on 05/21/2017 and the panel agree with neoadjuvant ddAC to T followed by surgery. Clinically cT3N1M0, Stage IIA  # Genetic testing Testing did not reveal a  pathogenic mutation in any of the genes analyzed. A copy of the genetic test report will be scanned into Epic under the Media tab.The genes analyzed were the 83 genes on [facility]'s Multi-Cancer panel (ALK, APC, ATM, AXIN2, BAP1, BARD1, BLM, BMPR1A, BRCA1, BRCA2, BRIP1, CASR, CDC73, CDH1, CDK4, CDKN1B, CDKN1C, CDKN2A, CEBPA, CHEK2, CTNNA1, DICER1, DIS3L2, EGFR, EPCAM, FH, FLCN, GATA2, GPC3, GREM1, HOXB13, HRAS, KIT, MAX, MEN1, MET, MITF, MLH1, MSH2, MSH3, MSH6, MUTYH, NBN, NF1, NF2, NTHL1, PALB2, PDGFRA, PHOX2B, PMS2, POLD1, POLE, POT1, PRKAR1A, PTCH1, PTEN, RAD50, RAD51C, RAD51D, RB1, RECQL4, RET, RUNX1, SDHA, SDHAF2, SDHB, SDHC, SDHD, SMAD4, SMARCA4, SMARCB1, SMARCE1, STK11, SUFU, TERC, TERT, TMEM127, TP53, TSC1, TSC2, VHL, WRN, WT1).   #Cancer treatment  Neoadjuvant chemotherapy 05/23/2017- 10/03/2017 patient completed s/p 4 cycles ddAC and weekly taxol x 12, on 10/26/2017 patient underwent that surgery.  Left axillary sentinel lymph node was positive, so Dr. Tamala Julian proceeded with left mastectomy and left axillary lymph node dissection. Pathology showed ypT2(m) pN1a, ER/PR positive, HER2 FISH negative, grade 2, negative margin, invasive mammary carcinoma.  S/p adjuvant RT.  Declined NATALEE Trial Clinical trial for pabociclib and letrozole.   # May 2019 Started on Letrozole  # referred for Medi port removal by Dr.Cintron.   History of uterine cancer.  INTERVAL HISTORY Patient presents for follow up for history of breast cancer ypT2 yp N1, s/p neoadjuvant chemotherapy and surgery ad adjuvant RT.  Patient presents for 7-monthfollow-up. Patient had a unilateral mammogram screening on 05/05/2020. Patient has no new concerns about her left mastectomy site and right breast. Denies any new bone pain, fever, chills, sore  throat, weight loss or abdominal pain .  Patient tolerates letrozole Review of Systems  Constitutional: Negative for appetite change, chills, fatigue and fever.  HENT:   Negative for  hearing loss and voice change.   Eyes: Negative for eye problems.  Respiratory: Negative for chest tightness and cough.   Cardiovascular: Negative for chest pain.  Gastrointestinal: Negative for abdominal distention, abdominal pain and blood in stool.  Endocrine: Positive for hot flashes.  Genitourinary: Negative for difficulty urinating and frequency.   Musculoskeletal: Negative for arthralgias.  Skin: Negative for itching and rash.  Neurological: Negative for extremity weakness.  Hematological: Negative for adenopathy.  Psychiatric/Behavioral: Negative for confusion.    MEDICAL HISTORY: Past Medical History:  Diagnosis Date  . Anemia   . Arthritis    HIP-LEFT  . Benign neoplasm of colon   . Breast cancer ([place]) 2018   left breast  . Cancer ([place])   . Diabetes mellitus without complication ([place])   . Genetic testing 01/10/2018   Multi-Cancer panel (83 genes) @ [facility] - No pathogenic mutations detected  . H/O osteopenia   . History of hiatal hernia   . Hypertension   . Personal history of chemotherapy   . Personal history of radiation therapy   . Sleep apnea    USE CPAP  . Uterine cancer [facility])     SURGICAL HISTORY: Past Surgical History:  Procedure Laterality Date  . ABDOMINAL HYSTERECTOMY    . BREAST BIOPSY Left 04/2014   negative  . BREAST EXCISIONAL BIOPSY Left 2015  . CESAREAN SECTION     X2  . COLONOSCOPY WITH PROPOFOL N/A 04/15/2015   Procedure: COLONOSCOPY WITH PROPOFOL;  Surgeon: Hulen Luster, MD;  Location: [facility] ENDOSCOPY;  Service: Gastroenterology;  Laterality: N/A;  . HERNIA REPAIR    . MASTECTOMY Left 10/2017  . MASTECTOMY MODIFIED RADICAL Left 10/26/2017   Procedure: MASTECTOMY MODIFIED RADICAL;  Surgeon: Leonie Green, MD;  Location: [facility] ORS;  Service: General;  Laterality: Left;  [name] MASTECTOMY W/ SENTINEL NODE BIOPSY Left 10/26/2017   Procedure: MASTECTOMY WITH SENTINEL LYMPH NODE BIOPSY;  Surgeon: Leonie Green, MD;  Location: [facility] ORS;   Service: General;  Laterality: Left;  . PORTACATH PLACEMENT Right 05/22/2017   Procedure: INSERTION PORT-A-CATH;  Surgeon: Leonie Green, MD;  Location: [facility] ORS;  Service: General;  Laterality: Right;    SOCIAL HISTORY: Social History   Socioeconomic History  . Marital status: Married    Spouse name: Not on file  . Number of children: Not on file  . Years of education: Not on file  . Highest education level: Not on file  Occupational History  . Not on file  Tobacco Use  . Smoking status: Never Smoker  . Smokeless tobacco: Never Used  Vaping Use  . Vaping Use: Never used  Substance and Sexual Activity  . Alcohol use: No  . Drug use: No  . Sexual activity: Not on file  Other Topics Concern  . Not on file  Social History Narrative  . Not on file   Social Determinants of Health   Financial Resource Strain:   . Difficulty of Paying Living Expenses:   Food Insecurity:   . Worried About Charity fundraiser in the Last Year:   . Arboriculturist in the Last Year:   Transportation Needs:   . Film/video editor (Medical):   [name] Lack of Transportation (Non-Medical):   Physical Activity:   . Days of Exercise per Week:   .  Minutes of Exercise per Session:   Stress:   . Feeling of Stress :   Social Connections:   . Frequency of Communication with Friends and Family:   . Frequency of Social Gatherings with Friends and Family:   . Attends Religious Services:   . Active Member of Clubs or Organizations:   . Attends Archivist Meetings:   [name] Marital Status:   Intimate Partner Violence:   . Fear of Current or Ex-Partner:   . Emotionally Abused:   [name] Physically Abused:   . Sexually Abused:     FAMILY HISTORY Family History  Problem Relation Age of Onset  . Diabetes Mother   . Diabetes Father   . Melanoma Father 43       on finger  . Diabetes Sister   . Pancreatic cancer Paternal Grandfather        deceased 10s  . Breast cancer Other 74       paternal  grandmother's mother    ALLERGIES:  has No Known Allergies.  MEDICATIONS:  Current Outpatient Medications  Medication Sig Dispense Refill  . Biotin 10 MG TABS Take by mouth.    . Calcium Carb-Cholecalciferol (CALCIUM 600-D PO) Take 1 tablet by mouth daily.    . Cinnamon 500 MG capsule Take 1,000 mg by mouth daily.     . [employer] B6 100 MG tablet TAKE 1 TABLET BY MOUTH EVERY DAY 100 tablet 3  . cyanocobalamin 1000 MCG tablet Take 1,000 mcg by mouth daily.    [name] FIBER PO Take 2 each by mouth daily.     [name] GLUCOSAMINE-CHONDROITIN PO Take 1 tablet by mouth daily.    [name] letrozole (FEMARA) 2.5 MG tablet TAKE 1 TABLET BY MOUTH EVERY DAY 90 tablet 2  . lisinopril-hydrochlorothiazide (PRINZIDE,ZESTORETIC) 20-12.5 MG per tablet Take 1 tablet by mouth daily.    . metFORMIN (GLUCOPHAGE) 500 MG tablet Take 1,000 mg by mouth 2 (two) times daily.     . Multiple Vitamin (MULTIVITAMIN) tablet Take 1 tablet by mouth daily.    . pioglitazone (ACTOS) 30 MG tablet Take 30 mg by mouth daily.    . trospium (SANCTURA) 20 MG tablet Take 20 mg by mouth 2 (two) times daily.    . diphenhydrAMINE (BENADRYL) 25 MG tablet Take 25 mg by mouth every 6 (six) hours as needed for allergies.  (Patient not taking: Reported on 05/10/2020)    . loperamide (IMODIUM) 2 MG capsule Take 1 capsule (2 mg total) by mouth See admin instructions. With onset of loose stool, take 23m followed by 289mevery 2 hours until 12 hours have passed without loose bowel movement. Maximum: 16 mg/day (Patient not taking: Reported on 05/10/2020) 120 capsule 1   No current facility-administered medications for this visit.    PHYSICAL EXAMINATION:  ECOG PERFORMANCE STATUS: 1 - Symptomatic but completely ambulatory  Vitals:   05/10/20 1006  BP: (!) 141/84  Pulse: 91  Resp: 18  Temp: 99 F (37.2 C)    Filed Weights   05/10/20 1006  Weight: (!) 229 lb 14.4 oz (104.3 kg)     Physical Exam Constitutional:      General: She is not in acute distress.     Appearance: She is not diaphoretic.     Comments: Obese,   HENT:     Head: Normocephalic and atraumatic.     Nose: Nose normal.     Mouth/Throat:     Pharynx: No oropharyngeal exudate.  Eyes:  General: No scleral icterus.    Pupils: Pupils are equal, round, and reactive to light.  Cardiovascular:     Rate and Rhythm: Normal rate and regular rhythm.     Heart sounds: No murmur heard.   Pulmonary:     Effort: Pulmonary effort is normal. No respiratory distress.     Breath sounds: No rales.  Chest:     Chest wall: No tenderness.  Abdominal:     General: There is no distension.     Palpations: Abdomen is soft.     Tenderness: There is no abdominal tenderness.  Musculoskeletal:        General: Normal range of motion.     Cervical back: Normal range of motion and neck supple.  Skin:    General: Skin is warm and dry.     Findings: No erythema.  Neurological:     Mental Status: She is alert and oriented to person, place, and time.     Cranial Nerves: No cranial nerve deficit.     Motor: No abnormal muscle tone.     Coordination: Coordination normal.  Psychiatric:        Mood and Affect: Affect normal.   Breast exam is performed in seating position.  History of left mastectomy.  No evidence of bilateral axillary adenopathy. No palpable mass of right breast.    LABORATORY DATA: CBC Latest Ref Rng & Units 05/10/2020 11/03/2019 08/05/2019  WBC 4.0 - 10.5 K/uL 7.6 8.7 8.1  Hemoglobin 12.0 - 15.0 g/dL 12.2 12.2 11.7(L)  Hematocrit 36 - 46 % 36.6 38.4 36.3  Platelets 150 - 400 K/uL 309 279 274   CMP Latest Ref Rng & Units 05/10/2020 11/03/2019 08/05/2019  Glucose 70 - 99 mg/dL 187(H) 194(H) 174(H)  BUN 8 - 23 mg/dL '13 23 20  ' Creatinine 0.44 - 1.00 mg/dL 0.75 0.82 0.79  Sodium 135 - 145 mmol/L 139 136 138  Potassium 3.5 - 5.1 mmol/L 3.7 4.5 4.1  Chloride 98 - 111 mmol/L 104 99 101  CO2 22 - 32 mmol/L '25 28 28  ' Calcium 8.9 - 10.3 mg/dL 9.3 9.8 9.7  Total Protein 6.5 - 8.1 g/dL  7.1 7.2 7.1  Total Bilirubin 0.3 - 1.2 mg/dL 0.7 0.5 0.4  Alkaline Phos 38 - 126 U/L 60 84 77  AST 15 - 41 U/L '20 19 16  ' ALT 0 - 44 U/L '17 15 13       ' RADIOGRAPHIC STUDIES: I have personally reviewed the radiological images as listed and agreed with the findings in the report. DG Bone Density  Result Date: 03/03/2020 EXAM: DUAL X-RAY ABSORPTIOMETRY (DXA) FOR BONE MINERAL DENSITY IMPRESSION: Your patient Marcine Gadway completed a BMD test on 03/03/2020 using the [place] (software version: 14.10) manufactured by [employer]. The following summarizes the results of our evaluation. Technologist:VLM PATIENT BIOGRAPHICAL: Name: Ruie, Sendejo Patient ID:  [id number] Birth Date: 08/18/1951 Height:     58.0 in. Gender:      Female Exam Date:  03/03/2020 Weight:     234.0 lbs. Indications: Diabetic, History of Breast Cancer, History of Uterine Cancer, Hysterectomy, Postmenopausal Fractures: Treatments: Calcium, Femara DENSITOMETRY RESULTS: Site      Region     Measured Date Measured Age WHO Classification Young Adult T-score BMD         %Change vs. Previous Significant Change (*) AP Spine L1-L4 03/03/2020 66.8 Normal -0.5 1.135 g/cm2 - - DualFemur Neck Right 03/03/2020 66.8 Osteopenia -1.2 0.868 g/cm2 - -  ASSESSMENT: The BMD measured at Femur Neck Right is 0.868 g/cm2 with a T-score of -1.2. This patient is considered OSTEOPENIC according to [facility]) criteria. The scan quality is good. World Pharmacologist [facility]) criteria for post-menopausal, Caucasian Women: Normal:                   T-score at or above -1 SD Osteopenia/low bone mass: T-score between -1 and -2.5 SD Osteoporosis:             T-score at or below -2.5 SD RECOMMENDATIONS: 1. All patients should optimize calcium and vitamin D intake. 2. Consider FDA-approved medical therapies in postmenopausal women and men aged 71 years and older, based on the following: a. A hip or vertebral(clinical  or morphometric) fracture b. T-score < -2.5 at the femoral neck or spine after appropriate evaluation to exclude secondary causes c. Low bone mass (T-score between -1.0 and -2.5 at the femoral neck or spine) and a 10-year probability of a hip fracture > 3% or a 10-year probability of a major osteoporosis-related fracture > 20% based on the [place]-adapted WHO algorithm 3. Clinician judgment and/or patient preferences may indicate treatment for people with 10-year fracture probabilities above or below these levels FOLLOW-UP: People with diagnosed cases of osteoporosis or at high risk for fracture should have regular bone mineral density tests. For patients eligible for Medicare, routine testing is allowed once every 2 years. The testing frequency can be increased to one year for patients who have rapidly progressing disease, those who are receiving or discontinuing medical therapy to restore bone mass, or have additional risk factors. I have reviewed this report, and agree with the above findings. Mark A. Thornton Papas, M.D. [facility] Dear Earlie Server, Your patient STORM DULSKI completed a FRAX assessment on 03/03/2020 using the [place] (analysis version: 14.10) manufactured by [employer]. The following summarizes the results of our evaluation. PATIENT BIOGRAPHICAL: Name: Veleda, Mun Patient ID: [id number] Birth Date: 1950/11/28 Height:    58.0 in. Gender:     Female    [age]       Weight:    234.0 lbs. Ethnicity:  White                            Exam Date: 03/03/2020 FRAX* RESULTS:  (version: 3.5) 10-year Probability of Fracture1 Major Osteoporotic Fracture2 Hip Fracture 7.2% 0.6% Population: [place] (Caucasian) Risk Factors: None Based on Femur (Right) Neck BMD 1 -The 10-year probability of fracture may be lower than reported if the patient has received treatment. 2 -Major Osteoporotic Fracture: Clinical Spine, Forearm, Hip or Shoulder *FRAX is a Materials engineer of the [employer]  of [employer] for Metabolic Bone Disease, a [place] (WHO) [facility]. ASSESSMENT: The probability of a major osteoporotic fracture is 7.2% within the next ten years. The probability of a hip fracture is 0.6% within the next ten years. I have reviewed this report and agree with the above findings. Mark A. Thornton Papas, M.D. [facility] Electronically Signed   By: Lavonia Dana M.D.   On: 03/03/2020 11:28   MM 3D SCREEN BREAST UNI RIGHT  Result Date: 05/06/2020 CLINICAL DATA:  Screening. EXAM: DIGITAL SCREENING UNILATERAL RIGHT MAMMOGRAM WITH CAD AND TOMO COMPARISON:  Previous exam(s). ACR Breast Density Category b: There are scattered areas of fibroglandular density. FINDINGS: There are no findings suspicious for  malignancy. Images were processed with CAD. IMPRESSION: No mammographic evidence of malignancy. A result letter of this screening mammogram will be mailed directly to the patient. RECOMMENDATION: Screening mammogram in one year. (Code:SM-B-01Y) BI-RADS CATEGORY  1: Negative. Electronically Signed   By: Lajean Manes M.D.   On: 05/06/2020 12:03   .   ASSESSMENT/PLAN 69 y.o. female presents with cT3cN1cMo 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  S.p post left modified radical mastectomy with ALND, s/p radiation.   1. Malignant neoplasm of upper-outer quadrant of left breast in female, estrogen receptor positive ([place])   2. Osteopenia, unspecified location   3. Aromatase inhibitor use    # ypT2N1a M0 disease, ER+PR+, HER2-. Residual disease after neoadjuvant chemotherapy s/p RT/  Clinically she is doing well.  No evidence of disease recurrence. Right screening mammogram was independently reviewed and discussed with the patient.  No mammographic findings suspicious for malignancy.  Patient will remain on annual screening  mammogram of the right breast. Continue letrozole, plan for total 10 years.  #Osteopenia, on Zometa every 6 months. Proceed with Zometa this week. Recent DEXA was reviewed and discussed with patient.  Continue calcium and vitamin D supplementation . #Diabetes, blood glucose level is 187.  Discussed with patient about diet, exercise and weight loss.  Continue follow-up with primary care provider for glycemic control. Follow up in 6 months. Earlie Server, MD, PhD Hematology Oncology [place] [facility] 05/10/2020

## 2020-05-10 NOTE — Progress Notes (Signed)
Pt here for follow up. No new concerns voiced.   

## 2020-05-12 ENCOUNTER — Inpatient Hospital Stay: Payer: Medicare HMO

## 2020-05-12 ENCOUNTER — Other Ambulatory Visit: Payer: Self-pay

## 2020-05-12 VITALS — BP 154/84 | HR 98 | Temp 97.0°F | Resp 19

## 2020-05-12 DIAGNOSIS — E860 Dehydration: Secondary | ICD-10-CM

## 2020-05-12 DIAGNOSIS — C50412 Malignant neoplasm of upper-outer quadrant of left female breast: Secondary | ICD-10-CM | POA: Diagnosis not present

## 2020-05-12 MED ORDER — SODIUM CHLORIDE 0.9 % IV SOLN
Freq: Once | INTRAVENOUS | Status: AC
  Filled 2020-05-12: qty 250

## 2020-05-12 MED ORDER — ZOLEDRONIC ACID 4 MG/100ML IV SOLN
4.0000 mg | Freq: Once | INTRAVENOUS | Status: AC
  Administered 2020-05-12: 4 mg via INTRAVENOUS
  Filled 2020-05-12: qty 100

## 2020-08-19 ENCOUNTER — Other Ambulatory Visit
Admission: RE | Admit: 2020-08-19 | Discharge: 2020-08-19 | Disposition: A | Discharge status: Home / Self Care | Payer: Medicare HMO | Source: Ambulatory Visit | Attending: Internal Medicine | Admitting: Internal Medicine

## 2020-08-19 ENCOUNTER — Other Ambulatory Visit: Payer: Self-pay

## 2020-08-19 DIAGNOSIS — Z01812 Encounter for preprocedural laboratory examination: Secondary | ICD-10-CM | POA: Insufficient documentation

## 2020-08-19 DIAGNOSIS — Z20822 Contact with and (suspected) exposure to covid-19: Secondary | ICD-10-CM | POA: Diagnosis not present

## 2020-08-19 LAB — SARS CORONAVIRUS 2 (TAT 6-24 HRS): SARS Coronavirus 2: NEGATIVE

## 2020-08-20 ENCOUNTER — Encounter: Payer: Self-pay | Admitting: Internal Medicine

## 2020-08-23 ENCOUNTER — Ambulatory Visit: Payer: Medicare HMO | Admitting: Certified Registered Nurse Anesthetist

## 2020-08-23 ENCOUNTER — Encounter
Admission: RE | Disposition: A | Discharge status: Home / Self Care | Payer: Self-pay | Source: Home / Self Care | Attending: Internal Medicine

## 2020-08-23 ENCOUNTER — Encounter: Payer: Self-pay | Admitting: Internal Medicine

## 2020-08-23 ENCOUNTER — Ambulatory Visit
Admission: RE | Admit: 2020-08-23 | Discharge: 2020-08-23 | Disposition: A | Discharge status: Home / Self Care | Payer: Medicare HMO | Attending: Internal Medicine | Admitting: Internal Medicine

## 2020-08-23 DIAGNOSIS — Z791 Long term (current) use of non-steroidal anti-inflammatories (NSAID): Secondary | ICD-10-CM | POA: Insufficient documentation

## 2020-08-23 DIAGNOSIS — Z7984 Long term (current) use of oral hypoglycemic drugs: Secondary | ICD-10-CM | POA: Diagnosis not present

## 2020-08-23 DIAGNOSIS — Z9221 Personal history of antineoplastic chemotherapy: Secondary | ICD-10-CM | POA: Diagnosis not present

## 2020-08-23 DIAGNOSIS — G4730 Sleep apnea, unspecified: Secondary | ICD-10-CM | POA: Diagnosis not present

## 2020-08-23 DIAGNOSIS — Z853 Personal history of malignant neoplasm of breast: Secondary | ICD-10-CM | POA: Diagnosis not present

## 2020-08-23 DIAGNOSIS — E119 Type 2 diabetes mellitus without complications: Secondary | ICD-10-CM | POA: Diagnosis not present

## 2020-08-23 DIAGNOSIS — Z1211 Encounter for screening for malignant neoplasm of colon: Secondary | ICD-10-CM | POA: Diagnosis not present

## 2020-08-23 DIAGNOSIS — Z86010 Personal history of colonic polyps: Secondary | ICD-10-CM | POA: Diagnosis not present

## 2020-08-23 DIAGNOSIS — Z8542 Personal history of malignant neoplasm of other parts of uterus: Secondary | ICD-10-CM | POA: Insufficient documentation

## 2020-08-23 DIAGNOSIS — Z923 Personal history of irradiation: Secondary | ICD-10-CM | POA: Diagnosis not present

## 2020-08-23 DIAGNOSIS — I10 Essential (primary) hypertension: Secondary | ICD-10-CM | POA: Insufficient documentation

## 2020-08-23 DIAGNOSIS — K640 First degree hemorrhoids: Secondary | ICD-10-CM | POA: Diagnosis not present

## 2020-08-23 HISTORY — DX: Trigger finger, unspecified finger: M65.30

## 2020-08-23 HISTORY — PX: COLONOSCOPY WITH PROPOFOL: SHX5780

## 2020-08-23 LAB — GLUCOSE, CAPILLARY: Glucose-Capillary: 194 mg/dL — ABNORMAL HIGH (ref 70–99)

## 2020-08-23 SURGERY — COLONOSCOPY WITH PROPOFOL
Anesthesia: General

## 2020-08-23 MED ORDER — PROPOFOL 10 MG/ML IV BOLUS
INTRAVENOUS | Status: DC | PRN
  Administered 2020-08-23: 60 mg via INTRAVENOUS

## 2020-08-23 MED ORDER — SODIUM CHLORIDE 0.9 % IV SOLN
INTRAVENOUS | Status: DC
  Administered 2020-08-23: 20 mL/h via INTRAVENOUS

## 2020-08-23 MED ORDER — PROPOFOL 500 MG/50ML IV EMUL
INTRAVENOUS | Status: AC
  Filled 2020-08-23: qty 50

## 2020-08-23 MED ORDER — LIDOCAINE HCL (CARDIAC) PF 100 MG/5ML IV SOSY
PREFILLED_SYRINGE | INTRAVENOUS | Status: DC | PRN
  Administered 2020-08-23: 50 mg via INTRAVENOUS

## 2020-08-23 MED ORDER — PROPOFOL 500 MG/50ML IV EMUL
INTRAVENOUS | Status: DC | PRN
  Administered 2020-08-23: 150 ug/kg/min via INTRAVENOUS

## 2020-08-23 MED ORDER — LIDOCAINE HCL (PF) 2 % IJ SOLN
INTRAMUSCULAR | Status: AC
  Filled 2020-08-23: qty 5

## 2020-08-23 NOTE — Transfer of Care (Signed)
Immediate Anesthesia Transfer of Care Note  Patient: Dominique Guzman  Procedure(s) Performed: COLONOSCOPY WITH PROPOFOL (N/A )  Patient Location: PACU  Anesthesia Type:General  Level of Consciousness: sedated and drowsy  Airway & Oxygen Therapy: Patient Spontanous Breathing and Patient connected to nasal cannula oxygen  Post-op Assessment: Report given to RN and Post -op Vital signs reviewed and stable  Post vital signs: Reviewed and stable  Last Vitals:  Vitals Value Taken Time  BP 125/63 08/23/20 1053  Temp 36.6 C 08/23/20 1052  Pulse 84 08/23/20 1054  Resp 17 08/23/20 1054  SpO2 97 % 08/23/20 1054  Vitals shown include unvalidated device data.  Last Pain:  Vitals:   08/23/20 1052  TempSrc: Temporal  PainSc: Asleep         Complications: No complications documented.

## 2020-08-23 NOTE — Anesthesia Preprocedure Evaluation (Addendum)
Anesthesia Evaluation  Patient identified by MRN, date of birth, ID band Patient awake    Reviewed: Allergy & Precautions, H&P , NPO status , Patient's Chart, lab work & pertinent test results  History of Anesthesia Complications Negative for: history of anesthetic complications  Airway Mallampati: III  TM Distance: >3 FB Neck ROM: full    Dental  (+) Teeth Intact   Pulmonary sleep apnea , neg COPD,    breath sounds clear to auscultation       Cardiovascular hypertension, (-) angina(-) Past MI and (-) Cardiac Stents (-) dysrhythmias  Rhythm:regular Rate:Normal     Neuro/Psych negative neurological ROS  negative psych ROS   GI/Hepatic Neg liver ROS, hiatal hernia,   Endo/Other  diabetesMorbid obesity  Renal/GU negative Renal ROS  negative genitourinary   Musculoskeletal   Abdominal   Peds  Hematology negative hematology ROS (+)   Anesthesia Other Findings Past Medical History: No date: Anemia No date: Arthritis     Comment:  HIP-LEFT No date: Benign neoplasm of colon 2018: Breast cancer ([place])     Comment:  left breast No date: Cancer ([place]) No date: Diabetes mellitus without complication ([place]) 09/81/1914: Genetic testing     Comment:  Multi-Cancer panel (83 genes) @ [facility] - No pathogenic               mutations detected No date: H/O osteopenia No date: History of hiatal hernia No date: Hypertension No date: Personal history of chemotherapy No date: Personal history of radiation therapy No date: Sleep apnea     Comment:  USE CPAP No date: Trigger finger, acquired No date: Uterine cancer ([place]) No date: Uterine cancer [facility])  Past Surgical History: No date: ABDOMINAL HYSTERECTOMY 04/2014: BREAST BIOPSY; Left     Comment:  negative 2015: BREAST EXCISIONAL BIOPSY; Left No date: CESAREAN SECTION     Comment:  X2 04/15/2015: COLONOSCOPY WITH PROPOFOL; N/A     Comment:  Procedure: COLONOSCOPY WITH PROPOFOL;   Surgeon: Hulen Luster, MD;  Location: [facility] ENDOSCOPY;  Service:               Gastroenterology;  Laterality: N/A; No date: HERNIA REPAIR 10/2017: MASTECTOMY; Left 10/26/2017: MASTECTOMY MODIFIED RADICAL; Left     Comment:  Procedure: MASTECTOMY MODIFIED RADICAL;  Surgeon: Leonie Green, MD;  Location: [facility] ORS;  Service:               General;  Laterality: Left; 10/26/2017: MASTECTOMY W/ SENTINEL NODE BIOPSY; Left     Comment:  Procedure: MASTECTOMY WITH SENTINEL LYMPH NODE BIOPSY;                Surgeon: Leonie Green, MD;  Location: [facility] ORS;                Service: General;  Laterality: Left; 05/22/2017: PORTACATH PLACEMENT; Right     Comment:  Procedure: INSERTION PORT-A-CATH;  Surgeon: Leonie Green, MD;  Location: [facility] ORS;  Service:               General;  Laterality: Right;  BMI    Body Mass Index: 49.77 kg/m      Reproductive/Obstetrics negative OB ROS  Anesthesia Physical Anesthesia Plan  ASA: III  Anesthesia Plan: General   Post-op Pain Management:    Induction:   PONV Risk Score and Plan: Propofol infusion and TIVA  Airway Management Planned: Nasal Cannula  Additional Equipment:   Intra-op Plan:   Post-operative Plan:   Informed Consent: I have reviewed the patients History and Physical, chart, labs and discussed the procedure including the risks, benefits and alternatives for the proposed anesthesia with the patient or authorized representative who has indicated his/her understanding and acceptance.     Dental Advisory Given  Plan Discussed with: Anesthesiologist, CRNA and Surgeon  Anesthesia Plan Comments:         Anesthesia Quick Evaluation

## 2020-08-23 NOTE — Interval H&P Note (Signed)
History and Physical Interval Note:  08/23/2020 9:49 AM  Dominique Guzman  has presented today for surgery, with the diagnosis of ANEMIA HX OF ADENTAMOUS COLON POLYPS.  The various methods of treatment have been discussed with the patient and family. After consideration of risks, benefits and other options for treatment, the patient has consented to  Procedure(s): COLONOSCOPY WITH PROPOFOL (N/A) as a surgical intervention.  The patient's history has been reviewed, patient examined, no change in status, stable for surgery.  I have reviewed the patient's chart and labs.  Questions were answered to the patient's satisfaction.     [place], [place]

## 2020-08-23 NOTE — H&P (Signed)
Outpatient short stay form Pre-procedure 08/23/2020 9:48 AM Dominique Guzman, M.D.  Primary Physician: Tracie Harrier, M.D.  Reason for visit: Remote history of adenomatous colon polyp (2006). Last two colonoscopies negative for adenoma (2011, 2016).  History of present illness:                           Patient presents for colonoscopy for a personal hx of colon polyps. The patient denies abdominal pain, abnormal weight loss or rectal bleeding.     Current Facility-Administered Medications:  .  0.9 %  sodium chloride infusion, , Intravenous, Continuous, [place], Benay Pike, MD, Last Rate: 20 mL/hr at 08/23/20 0930, 20 mL/hr at 08/23/20 0930  Medications Prior to Admission  Medication Sig Dispense Refill Last Dose  . Biotin 10 MG TABS Take by mouth.   Past Week at Unknown time  . Calcium Carb-Cholecalciferol (CALCIUM 600-D PO) Take 1 tablet by mouth daily.   Past Week at Unknown time  . Cinnamon 500 MG capsule Take 1,000 mg by mouth daily.    Past Week at Unknown time  . [employer] B6 100 MG tablet TAKE 1 TABLET BY MOUTH EVERY DAY 100 tablet 3 Past Week at Unknown time  . cyanocobalamin 1000 MCG tablet Take 1,000 mcg by mouth daily.   Past Week at Unknown time  . diphenhydrAMINE (BENADRYL) 25 MG tablet Take 25 mg by mouth every 6 (six) hours as needed for allergies.    Past Week at Unknown time  . FIBER PO Take 2 each by mouth daily.    Past Week at Unknown time  . GLUCOSAMINE-CHONDROITIN PO Take 1 tablet by mouth daily.   Past Week at Unknown time  . letrozole (FEMARA) 2.5 MG tablet Take 1 tablet (2.5 mg total) by mouth daily. 90 tablet 1 08/23/2020 at 0620  . lisinopril-hydrochlorothiazide (PRINZIDE,ZESTORETIC) 20-12.5 MG per tablet Take 1 tablet by mouth daily.   08/23/2020 at 0620  . meloxicam (MOBIC) 15 MG tablet Take 15 mg by mouth daily.   Past Week at Unknown time  . metFORMIN (GLUCOPHAGE) 500 MG tablet Take 1,000 mg by mouth 2 (two) times daily.    Past Week at Unknown time  .  Multiple Vitamin (MULTIVITAMIN) tablet Take 1 tablet by mouth daily.   Past Week at Unknown time  . pioglitazone (ACTOS) 30 MG tablet Take 30 mg by mouth daily.   Past Week at Unknown time  . trospium (SANCTURA) 20 MG tablet Take 20 mg by mouth 2 (two) times daily.   08/23/2020 at 0620  . loperamide (IMODIUM) 2 MG capsule Take 1 capsule (2 mg total) by mouth See admin instructions. With onset of loose stool, take 4mg  followed by 2mg  every 2 hours until 12 hours have passed without loose bowel movement. Maximum: 16 mg/day (Patient not taking: Reported on 05/10/2020) 120 capsule 1 Not Taking at Unknown time     Allergies  Allergen Reactions  . Crestor [Rosuvastatin] Other (See Comments)    muscle pain     Past Medical History:  Diagnosis Date  . Anemia   . Arthritis    HIP-LEFT  . Benign neoplasm of colon   . Breast cancer ([place]) 2018   left breast  . Cancer ([place])   . Diabetes mellitus without complication ([place])   . Genetic testing 01/10/2018   Multi-Cancer panel (83 genes) @ [facility] - No pathogenic mutations detected  . H/O osteopenia   . History of hiatal hernia   .  Hypertension   . Personal history of chemotherapy   . Personal history of radiation therapy   . Sleep apnea    USE CPAP  . Trigger finger, acquired   . Uterine cancer ([place])   . Uterine cancer ([place])     Review of systems:  Otherwise negative.    Physical Exam  Gen: Alert, oriented. Appears stated age.  HEENT: [place]/AT. PERRLA. Lungs: CTA, no wheezes. CV: RR nl S1, S2. Abd: soft, benign, no masses. BS+ Ext: No edema. Pulses 2+    Planned procedures: Proceed with colonoscopy. The patient understands the nature of the planned procedure, indications, risks, alternatives and potential complications including but not limited to bleeding, infection, perforation, damage to internal organs and possible oversedation/side effects from anesthesia. The patient agrees and gives consent to proceed.  Please refer to  procedure notes for findings, recommendations and patient disposition/instructions.     Dominique Guzman, M.D. Gastroenterology 08/23/2020  9:48 AM

## 2020-08-23 NOTE — Anesthesia Postprocedure Evaluation (Signed)
Anesthesia Post Note  Patient: Dominique Guzman  Procedure(s) Performed: COLONOSCOPY WITH PROPOFOL (N/A )  Patient location during evaluation: PACU Anesthesia Type: General Level of consciousness: awake and alert Pain management: pain level controlled Vital Signs Assessment: post-procedure vital signs reviewed and stable Respiratory status: spontaneous breathing, nonlabored ventilation and respiratory function stable Cardiovascular status: blood pressure returned to baseline and stable Postop Assessment: no apparent nausea or vomiting Anesthetic complications: no   No complications documented.   Last Vitals:  Vitals:   08/23/20 1102 08/23/20 1112  BP: (!) 137/93 (!) 151/113  Pulse:    Resp:    Temp:    SpO2:      Last Pain:  Vitals:   08/23/20 1112  TempSrc:   PainSc: 0-No pain                 Brett Canales Rae Plotner

## 2020-08-23 NOTE — Op Note (Signed)
[facility] Gastroenterology Patient Name: Dominique Guzman Procedure Date: 08/23/2020 10:13 AM MRN: [id number] Account #: [id number] Date of Birth: 02/13/1951 Admit Type: Outpatient Age: 69 Room: [facility] ENDO ROOM 2 Gender: Female Note Status: Finalized Procedure:             Colonoscopy Indications:           Surveillance: Personal history of adenomatous polyps                         on last colonoscopy > 5 years ago Providers:             Lorie Apley K. Alice Reichert MD, MD Referring MD:          Tracie Harrier, MD (Referring MD) Medicines:             Propofol per Anesthesia Complications:         No immediate complications. Procedure:             Pre-Anesthesia Assessment:                        - The risks and benefits of the procedure and the                         sedation options and risks were discussed with the                         patient. All questions were answered and informed                         consent was obtained.                        - Patient identification and proposed procedure were                         verified prior to the procedure by the nurse. The                         procedure was verified in the procedure room.                        - ASA Grade Assessment: III - A patient with severe                         systemic disease.                        - After reviewing the risks and benefits, the patient                         was deemed in satisfactory condition to undergo the                         procedure.                        After obtaining informed consent, the colonoscope was                         passed under direct vision.  Throughout the procedure,                         the patient's blood pressure, pulse, and oxygen                         saturations were monitored continuously. The                         Colonoscope was introduced through the anus and                         advanced to the the cecum,  identified by appendiceal                         orifice and ileocecal valve. The colonoscopy was                         performed without difficulty. The patient tolerated                         the procedure well. The quality of the bowel                         preparation was good. The ileocecal valve, appendiceal                         orifice, and rectum were photographed. Findings:      The perianal and digital rectal examinations were normal. Pertinent       negatives include normal sphincter tone and no palpable rectal lesions.      The colon (entire examined portion) appeared normal.      Non-bleeding internal hemorrhoids were found during retroflexion. The       hemorrhoids were Grade I (internal hemorrhoids that do not prolapse).      The exam was otherwise without abnormality. Impression:            - The entire examined colon is normal.                        - Non-bleeding internal hemorrhoids.                        - The examination was otherwise normal.                        - No specimens collected. Recommendation:        - Patient has a contact number available for                         emergencies. The signs and symptoms of potential                         delayed complications were discussed with the patient.                         Return to normal activities tomorrow. Written                         discharge instructions were  provided to the patient.                        - Resume previous diet.                        - Continue present medications.                        - Repeat colonoscopy in 10 years for screening                         purposes.                        - Return to GI office PRN.                        - The findings and recommendations were discussed with                         the patient. Procedure Code(s):     --- Professional ---                        G5364, Colorectal cancer screening; colonoscopy on                          individual at high risk Diagnosis Code(s):     --- Professional ---                        K64.0, First degree hemorrhoids                        Z86.010, Personal history of colonic polyps CPT copyright 2019 [employer]. All rights reserved. The codes documented in this report are preliminary and upon coder review may  be revised to meet current compliance requirements. Efrain Sella MD, MD 08/23/2020 10:50:01 AM This report has been signed electronically. Number of Addenda: 0 Note Initiated On: 08/23/2020 10:13 AM Scope Withdrawal Time: 0 hours 4 minutes 45 seconds  Total Procedure Duration: 0 hours 15 minutes 11 seconds  Estimated Blood Loss:  Estimated blood loss: none.      [facility]

## 2020-09-16 ENCOUNTER — Encounter: Payer: Self-pay | Admitting: Radiation Oncology

## 2020-09-16 ENCOUNTER — Other Ambulatory Visit: Payer: Self-pay

## 2020-09-16 ENCOUNTER — Ambulatory Visit
Admission: RE | Admit: 2020-09-16 | Discharge: 2020-09-16 | Disposition: A | Discharge status: Home / Self Care | Payer: Medicare HMO | Source: Ambulatory Visit | Attending: Radiation Oncology | Admitting: Radiation Oncology

## 2020-09-16 VITALS — BP 140/63 | HR 91 | Temp 96.1°F | Resp 16 | Wt 226.5 lb

## 2020-09-16 DIAGNOSIS — C50812 Malignant neoplasm of overlapping sites of left female breast: Secondary | ICD-10-CM | POA: Diagnosis not present

## 2020-09-16 DIAGNOSIS — Z170 Estrogen receptor positive status [ER+]: Secondary | ICD-10-CM | POA: Diagnosis not present

## 2020-09-16 DIAGNOSIS — Z79811 Long term (current) use of aromatase inhibitors: Secondary | ICD-10-CM | POA: Diagnosis not present

## 2020-09-16 DIAGNOSIS — Z923 Personal history of irradiation: Secondary | ICD-10-CM | POA: Diagnosis not present

## 2020-09-16 NOTE — Progress Notes (Signed)
Radiation Oncology Follow up Note  Name: Dominique Guzman   Date:   09/16/2020 MRN:  [id number] DOB: July 07, 1951    This 69 y.o. 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  For a T2N1 ER/PR positive invasive mammary carcinoma status post modified radical mastectomy seen today in routine follow-up she is doing well.  She specifically denies any chest wall tenderness bone pain or cough.  She is having no swelling in her left upper extremity.  She is currently on letrozole tolerating it well without side effect.  She had a mammogram of her right breast back in July which was BI-RADS 1 negative.  COMPLICATIONS OF TREATMENT: none  FOLLOW UP COMPLIANCE: keeps appointments   PHYSICAL EXAM:  BP 140/63 (BP Location: Right Arm, Patient Position: Sitting)   Pulse 91   Temp (!) 96.1 F (35.6 C) (Tympanic)   Resp 16   Wt 226 lb 8 oz (102.7 kg)   LMP  (LMP Unknown)   BMI 49.01 kg/m  Patient status post left modified radical mastectomy.  No evidence of chest wall mass or nodularity.  Right breast is free of dominant mass.  No axillary or supraclavicular adenopathy is identified.  No evidence of lymphedema of her left upper extremity is noted.  Well-developed well-nourished patient in NAD. HEENT reveals PERLA, EOMI, discs not visualized.  Oral cavity is clear. No oral mucosal lesions are identified. Neck is clear without evidence of cervical or supraclavicular adenopathy. Lungs are clear to A&P. Cardiac examination is essentially unremarkable with regular rate and rhythm without murmur rub or thrill. Abdomen is  benign with no organomegaly or masses noted. Motor sensory and DTR levels are equal and symmetric in the upper and lower extremities. Cranial nerves II through XII are grossly intact. Proprioception is intact. No peripheral adenopathy or edema is identified. No motor or sensory levels are noted. Crude visual fields are within normal range.  RADIOLOGY RESULTS: Mammograms reviewed compatible with above-stated findings  PLAN: Present time patient is now 2 and half years out with no evidence of disease.  I am pleased with her overall progress.  I have asked to see her back in 1 year for follow-up.  Patient knows to call with any concerns.  She continues on letrozole without side effect.  I would like to take this opportunity to thank you for allowing me to participate in the care of your patient.Noreene Filbert, MD

## 2020-11-15 ENCOUNTER — Inpatient Hospital Stay: Payer: Medicare HMO

## 2020-11-15 ENCOUNTER — Inpatient Hospital Stay: Payer: Medicare HMO | Attending: Oncology

## 2020-11-15 ENCOUNTER — Inpatient Hospital Stay: Payer: Medicare HMO | Admitting: Oncology

## 2020-11-15 ENCOUNTER — Encounter: Payer: Self-pay | Admitting: Oncology

## 2020-11-15 VITALS — BP 122/76 | HR 91 | Temp 99.2°F | Resp 18 | Wt 227.0 lb

## 2020-11-15 DIAGNOSIS — C50412 Malignant neoplasm of upper-outer quadrant of left female breast: Secondary | ICD-10-CM | POA: Diagnosis present

## 2020-11-15 DIAGNOSIS — Z7984 Long term (current) use of oral hypoglycemic drugs: Secondary | ICD-10-CM | POA: Insufficient documentation

## 2020-11-15 DIAGNOSIS — Z9012 Acquired absence of left breast and nipple: Secondary | ICD-10-CM | POA: Diagnosis not present

## 2020-11-15 DIAGNOSIS — Z90710 Acquired absence of both cervix and uterus: Secondary | ICD-10-CM | POA: Diagnosis not present

## 2020-11-15 DIAGNOSIS — Z170 Estrogen receptor positive status [ER+]: Secondary | ICD-10-CM | POA: Diagnosis not present

## 2020-11-15 DIAGNOSIS — Z803 Family history of malignant neoplasm of breast: Secondary | ICD-10-CM | POA: Diagnosis not present

## 2020-11-15 DIAGNOSIS — Z8542 Personal history of malignant neoplasm of other parts of uterus: Secondary | ICD-10-CM | POA: Insufficient documentation

## 2020-11-15 DIAGNOSIS — Z923 Personal history of irradiation: Secondary | ICD-10-CM | POA: Diagnosis not present

## 2020-11-15 DIAGNOSIS — Z79811 Long term (current) use of aromatase inhibitors: Secondary | ICD-10-CM

## 2020-11-15 DIAGNOSIS — I10 Essential (primary) hypertension: Secondary | ICD-10-CM | POA: Insufficient documentation

## 2020-11-15 DIAGNOSIS — E860 Dehydration: Secondary | ICD-10-CM

## 2020-11-15 DIAGNOSIS — Z833 Family history of diabetes mellitus: Secondary | ICD-10-CM | POA: Diagnosis not present

## 2020-11-15 DIAGNOSIS — Z79899 Other long term (current) drug therapy: Secondary | ICD-10-CM | POA: Insufficient documentation

## 2020-11-15 DIAGNOSIS — M8580 Other specified disorders of bone density and structure, unspecified site: Secondary | ICD-10-CM | POA: Insufficient documentation

## 2020-11-15 DIAGNOSIS — E119 Type 2 diabetes mellitus without complications: Secondary | ICD-10-CM | POA: Insufficient documentation

## 2020-11-15 DIAGNOSIS — G4730 Sleep apnea, unspecified: Secondary | ICD-10-CM | POA: Diagnosis not present

## 2020-11-15 DIAGNOSIS — Z9221 Personal history of antineoplastic chemotherapy: Secondary | ICD-10-CM | POA: Insufficient documentation

## 2020-11-15 DIAGNOSIS — Z800 Family history of malignant neoplasm of digestive organs: Secondary | ICD-10-CM | POA: Insufficient documentation

## 2020-11-15 LAB — COMPREHENSIVE METABOLIC PANEL
ALT: 12 U/L (ref 0–44)
AST: 20 U/L (ref 15–41)
Albumin: 3.7 g/dL (ref 3.5–5.0)
Alkaline Phosphatase: 65 U/L (ref 38–126)
Anion gap: 9 (ref 5–15)
BUN: 21 mg/dL (ref 8–23)
CO2: 27 mmol/L (ref 22–32)
Calcium: 9.5 mg/dL (ref 8.9–10.3)
Chloride: 99 mmol/L (ref 98–111)
Creatinine, Ser: 0.77 mg/dL (ref 0.44–1.00)
GFR, Estimated: >60 mL/min (ref >60)
Glucose, Bld: 164 mg/dL — ABNORMAL HIGH (ref 70–99)
Potassium: 4.2 mmol/L (ref 3.5–5.1)
Sodium: 135 mmol/L (ref 135–145)
Total Bilirubin: 0.4 mg/dL (ref 0.3–1.2)
Total Protein: 7.2 g/dL (ref 6.5–8.1)

## 2020-11-15 LAB — CBC WITH DIFFERENTIAL/PLATELET
Abs Immature Granulocytes: 0.05 10*3/uL (ref 0.00–0.07)
Basophils Absolute: 0.1 10*3/uL (ref 0.0–0.1)
Basophils Relative: 1 %
Eosinophils Absolute: 0.4 10*3/uL (ref 0.0–0.5)
Eosinophils Relative: 5 %
HCT: 37.4 % (ref 36.0–46.0)
Hemoglobin: 12.3 g/dL (ref 12.0–15.0)
Immature Granulocytes: 1 %
Lymphocytes Relative: 21 %
Lymphs Abs: 1.9 10*3/uL (ref 0.7–4.0)
MCH: 28.3 pg (ref 26.0–34.0)
MCHC: 32.9 g/dL (ref 30.0–36.0)
MCV: 86 fL (ref 80.0–100.0)
Monocytes Absolute: 0.8 10*3/uL (ref 0.1–1.0)
Monocytes Relative: 9 %
Neutro Abs: 5.8 10*3/uL (ref 1.7–7.7)
Neutrophils Relative %: 63 %
Platelets: 312 10*3/uL (ref 150–400)
RBC: 4.35 MIL/uL (ref 3.87–5.11)
RDW: 13.5 % (ref 11.5–15.5)
WBC: 8.9 10*3/uL (ref 4.0–10.5)
nRBC: 0 % (ref 0.0–0.2)

## 2020-11-15 MED ORDER — SODIUM CHLORIDE 0.9 % IV SOLN
Freq: Once | INTRAVENOUS | Status: AC
  Filled 2020-11-15: qty 250

## 2020-11-15 MED ORDER — ZOLEDRONIC ACID 4 MG/100ML IV SOLN
4.0000 mg | Freq: Once | INTRAVENOUS | Status: AC
  Administered 2020-11-15: 4 mg via INTRAVENOUS
  Filled 2020-11-15: qty 100

## 2020-11-15 NOTE — Progress Notes (Signed)
[facility] Follow Up Visit.  Patient Care Team: Tracie Harrier, MD as PCP - General (Internal Medicine) Rico Junker, RN as Oncology Nurse Navigator Earlie Server, MD as Consulting Physician (Oncology) Leonie Green, MD as Referring Physician (Surgery) Noreene Filbert, MD as Referring Physician (Radiation Oncology)  Reason for visit Follow up for management of Stage IIA left breast cancer.   Pertinent Oncology History Dominique Guzman 70 y.o. female with PMH listed as below is referred by Dr.Schermerhorn to [name] for evaluation and management of newly diagnosed breast cancer. Patient had a history a left breast mass biopsy followed by breast excisonal biopsy in 2015 with benign pathology.  She also has a history of Uterine cancer which was treated with RT with Dr.Chrystal.  She underwent routine screening mammogram on 04/17/2017 which recommended a possible mass on the left breast. Left diagnostic mammogram showed a highly suspicious left breast mass at 4 o'clock, 7 cm from the nipple and 1 o'clock,,  About 3 cm from the nipple. Enlarged left axillary lymph node suspicious for metastatic disease.  1 US breast confirmed a left breast mass of 2.1cm, 4 o'clock, and 1.1 cm mass at 1 o'clock.The masses at 1 and 4 o'clock are separated by approximately 4 cm sonographically 2 Biopsy of the two mass and axillary lymph node revealed invasive carcinoma, grade 2. Both are ER,PR positive. HER2 equivocal, FISH pending. 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  3 05/14/2017 MRI breast showed Two sites of biopsy proven malignancy seen in the left breast, which span up to 11.3 cm. There are intervening areas of non mass enhancement and a small suspicious enhancing mass. 4 Case was discussed on breast tumor board on 05/21/2017 and the panel agree with neoadjuvant ddAC to T followed by surgery. Clinically cT3N1M0, Stage IIA  # Genetic testing Testing did not reveal a  pathogenic mutation in any of the genes analyzed. A copy of the genetic test report will be scanned into Epic under the Media tab.The genes analyzed were the 83 genes on [facility]'s Multi-Cancer panel (ALK, APC, ATM, AXIN2, BAP1, BARD1, BLM, BMPR1A, BRCA1, BRCA2, BRIP1, CASR, CDC73, CDH1, CDK4, CDKN1B, CDKN1C, CDKN2A, CEBPA, CHEK2, CTNNA1, DICER1, DIS3L2, EGFR, EPCAM, FH, FLCN, GATA2, GPC3, GREM1, HOXB13, HRAS, KIT, MAX, MEN1, MET, MITF, MLH1, MSH2, MSH3, MSH6, MUTYH, NBN, NF1, NF2, NTHL1, PALB2, PDGFRA, PHOX2B, PMS2, POLD1, POLE, POT1, PRKAR1A, PTCH1, PTEN, RAD50, RAD51C, RAD51D, RB1, RECQL4, RET, RUNX1, SDHA, SDHAF2, SDHB, SDHC, SDHD, SMAD4, SMARCA4, SMARCB1, SMARCE1, STK11, SUFU, TERC, TERT, TMEM127, TP53, TSC1, TSC2, VHL, WRN, WT1).   #Cancer treatment  Neoadjuvant chemotherapy 05/23/2017- 10/03/2017 patient completed s/p 4 cycles ddAC and weekly taxol x 12, on 10/26/2017 patient underwent that surgery.  Left axillary sentinel lymph node was positive, so Dr. Tamala Julian proceeded with left mastectomy and left axillary lymph node dissection. Pathology showed ypT2(m) pN1a, ER/PR positive, HER2 FISH negative, grade 2, negative margin, invasive mammary carcinoma.  S/p adjuvant RT.  Declined NATALEE Trial Clinical trial for pabociclib and letrozole.   # May 2019 Started on Letrozole  # Mediport removed 09/18/2018  History of uterine cancer.  INTERVAL HISTORY Patient presents for follow up for history of breast cancer ypT2 yp N1, s/p neoadjuvant chemotherapy and surgery ad adjuvant RT.  Patient presents for 82-monthfollow-up. Patient denies any new bone pain, concerns of her mastectomy site or right breast. She takes letrozole.  Manageable hot flashes.  Some increase of hair loss. Review of Systems  Constitutional: Negative for  appetite change, chills, fatigue and fever.  HENT:   Negative for hearing loss and voice change.   Eyes: Negative for eye problems.  Respiratory: Negative for chest tightness  and cough.   Cardiovascular: Negative for chest pain.  Gastrointestinal: Negative for abdominal distention, abdominal pain and blood in stool.  Endocrine: Positive for hot flashes.  Genitourinary: Negative for difficulty urinating and frequency.   Musculoskeletal: Negative for arthralgias.  Skin: Negative for itching and rash.  Neurological: Negative for extremity weakness.  Hematological: Negative for adenopathy.  Psychiatric/Behavioral: Negative for confusion.    MEDICAL HISTORY: Past Medical History:  Diagnosis Date  . Anemia   . Arthritis    HIP-LEFT  . Benign neoplasm of colon   . Breast cancer ([place]) 2018   left breast  . Cancer ([place])   . Diabetes mellitus without complication ([place])   . Genetic testing 01/10/2018   Multi-Cancer panel (83 genes) @ [facility] - No pathogenic mutations detected  . H/O osteopenia   . History of hiatal hernia   . Hypertension   . Personal history of chemotherapy   . Personal history of radiation therapy   . Sleep apnea    USE CPAP  . Trigger finger, acquired   . Uterine cancer ([place])   . Uterine cancer [facility])     SURGICAL HISTORY: Past Surgical History:  Procedure Laterality Date  . ABDOMINAL HYSTERECTOMY    . BREAST BIOPSY Left 04/2014   negative  . BREAST EXCISIONAL BIOPSY Left 2015  . CESAREAN SECTION     X2  . COLONOSCOPY WITH PROPOFOL N/A 04/15/2015   Procedure: COLONOSCOPY WITH PROPOFOL;  Surgeon: Hulen Luster, MD;  Location[facility] ENDOSCOPY;  Service: Gastroenterology;  Laterality: N/A;  . COLONOSCOPY WITH PROPOFOL N/A 08/23/2020   Procedure: COLONOSCOPY WITH PROPOFOL;  Surgeon: Toledo, Benay Pike, MD;  Location: [facility] ENDOSCOPY;  Service: Gastroenterology;  Laterality: N/A;  . HERNIA REPAIR    . MASTECTOMY Left 10/2017  . MASTECTOMY MODIFIED RADICAL Left 10/26/2017   Procedure: MASTECTOMY MODIFIED RADICAL;  Surgeon: Leonie Green, MD;  Location: [facility] ORS;  Service: General;  Laterality: Left;  [name] MASTECTOMY W/ SENTINEL NODE BIOPSY  Left 10/26/2017   Procedure: MASTECTOMY WITH SENTINEL LYMPH NODE BIOPSY;  Surgeon: Leonie Green, MD;  Location: [facility] ORS;  Service: General;  Laterality: Left;  . PORTACATH PLACEMENT Right 05/22/2017   Procedure: INSERTION PORT-A-CATH;  Surgeon: Leonie Green, MD;  Location: [facility] ORS;  Service: General;  Laterality: Right;    SOCIAL HISTORY: Social History   Socioeconomic History  . Marital status: Married    Spouse name: Not on file  . Number of children: Not on file  . Years of education: Not on file  . Highest education level: Not on file  Occupational History  . Not on file  Tobacco Use  . Smoking status: Never Smoker  . Smokeless tobacco: Never Used  Vaping Use  . Vaping Use: Never used  Substance and Sexual Activity  . Alcohol use: No  . Drug use: No  . Sexual activity: Not on file  Other Topics Concern  . Not on file  Social History Narrative  . Not on file   Social Determinants of Health   Financial Resource Strain: Not on file  Food Insecurity: Not on file  Transportation Needs: Not on file  Physical Activity: Not on file  Stress: Not on file  Social Connections: Not on file  Intimate Partner Violence: Not on file    FAMILY  HISTORY Family History  Problem Relation Age of Onset  . Diabetes Mother   . Diabetes Father   . Melanoma Father 46       on finger  . Diabetes Sister   . Pancreatic cancer Paternal Grandfather        deceased 68s  . Breast cancer Other 1       paternal grandmother's mother    ALLERGIES:  is allergic to crestor [rosuvastatin].  MEDICATIONS:  Current Outpatient Medications  Medication Sig Dispense Refill  . Biotin 10 MG TABS Take by mouth.    . Calcium Carb-Cholecalciferol (CALCIUM 600-D PO) Take 1 tablet by mouth daily.    . Cinnamon 500 MG capsule Take 1,000 mg by mouth daily.     . [employer] B6 100 MG tablet TAKE 1 TABLET BY MOUTH EVERY DAY 100 tablet 3  . cyanocobalamin 1000 MCG tablet Take 1,000 mcg by mouth  daily.    . diphenhydrAMINE (BENADRYL) 25 MG tablet Take 25 mg by mouth every 6 (six) hours as needed for allergies.     [name] FIBER PO Take 2 each by mouth daily.     [name] GLUCOSAMINE-CHONDROITIN PO Take 1 tablet by mouth daily.    [name] letrozole (FEMARA) 2.5 MG tablet Take 1 tablet (2.5 mg total) by mouth daily. 90 tablet 1  . lisinopril-hydrochlorothiazide (PRINZIDE,ZESTORETIC) 20-12.5 MG per tablet Take 1 tablet by mouth daily.    [name] loperamide (IMODIUM) 2 MG capsule Take 1 capsule (2 mg total) by mouth See admin instructions. With onset of loose stool, take 79m followed by 253mevery 2 hours until 12 hours have passed without loose bowel movement. Maximum: 16 mg/day (Patient not taking: No sig reported) 120 capsule 1  . meloxicam (MOBIC) 15 MG tablet Take 15 mg by mouth daily.    . metFORMIN (GLUCOPHAGE) 500 MG tablet Take 1,000 mg by mouth 2 (two) times daily.     . Multiple Vitamin (MULTIVITAMIN) tablet Take 1 tablet by mouth daily.    . pioglitazone (ACTOS) 30 MG tablet Take 30 mg by mouth daily.    . trospium (SANCTURA) 20 MG tablet Take 20 mg by mouth 2 (two) times daily.     No current facility-administered medications for this visit.    PHYSICAL EXAMINATION:  ECOG PERFORMANCE STATUS: 1 - Symptomatic but completely ambulatory  Vitals:   11/15/20 1314  BP: 122/76  Pulse: 91  Resp: 18  Temp: 99.2 F (37.3 C)    Filed Weights   11/15/20 1314  Weight: 227 lb (103 kg)     Physical Exam Constitutional:      General: She is not in acute distress.    Appearance: She is not diaphoretic.     Comments: Obese,   HENT:     Head: Normocephalic and atraumatic.     Nose: Nose normal.     Mouth/Throat:     Pharynx: No oropharyngeal exudate.  Eyes:     General: No scleral icterus.    Pupils: Pupils are equal, round, and reactive to light.  Cardiovascular:     Rate and Rhythm: Normal rate and regular rhythm.     Heart sounds: No murmur heard.   Pulmonary:     Effort: Pulmonary  effort is normal. No respiratory distress.     Breath sounds: No rales.  Chest:     Chest wall: No tenderness.  Abdominal:     General: There is no distension.     Palpations: Abdomen is soft.  Tenderness: There is no abdominal tenderness.  Musculoskeletal:        General: Normal range of motion.     Cervical back: Normal range of motion and neck supple.  Skin:    General: Skin is warm and dry.     Findings: No erythema.  Neurological:     Mental Status: She is alert and oriented to person, place, and time.     Cranial Nerves: No cranial nerve deficit.     Motor: No abnormal muscle tone.     Coordination: Coordination normal.  Psychiatric:        Mood and Affect: Affect normal.   Breast exam is performed in seating position.  History of left mastectomy.  No evidence of bilateral axillary adenopathy. No palpable mass of right breast.    LABORATORY DATA: CBC Latest Ref Rng & Units 05/10/2020 11/03/2019 08/05/2019  WBC 4.0 - 10.5 K/uL 7.6 8.7 8.1  Hemoglobin 12.0 - 15.0 g/dL 12.2 12.2 11.7(L)  Hematocrit 36.0 - 46.0 % 36.6 38.4 36.3  Platelets 150 - 400 K/uL 309 279 274   CMP Latest Ref Rng & Units 05/10/2020 11/03/2019 08/05/2019  Glucose 70 - 99 mg/dL 187(H) 194(H) 174(H)  BUN 8 - 23 mg/dL '13 23 20  ' Creatinine 0.44 - 1.00 mg/dL 0.75 0.82 0.79  Sodium 135 - 145 mmol/L 139 136 138  Potassium 3.5 - 5.1 mmol/L 3.7 4.5 4.1  Chloride 98 - 111 mmol/L 104 99 101  CO2 22 - 32 mmol/L '25 28 28  ' Calcium 8.9 - 10.3 mg/dL 9.3 9.8 9.7  Total Protein 6.5 - 8.1 g/dL 7.1 7.2 7.1  Total Bilirubin 0.3 - 1.2 mg/dL 0.7 0.5 0.4  Alkaline Phos 38 - 126 U/L 60 84 77  AST 15 - 41 U/L '20 19 16  ' ALT 0 - 44 U/L '17 15 13       ' RADIOGRAPHIC STUDIES: I have personally reviewed the radiological images as listed and agreed with the findings in the report. No results found. .   ASSESSMENT/PLAN 70 y.o. female presents with cT3cN1cMo left breast multifocal breast invasive mammary carcinoma, ER+, PR+  HER2 negative by FISH, s/p neoadjuvant chemotherapy with 4 cycles of ddAC,and 12 weekly taxol.  S.p post left modified radical mastectomy with ALND, s/p radiation.   1. Malignant neoplasm of upper-outer quadrant of left breast in female, estrogen receptor positive ([place])   2. Aromatase inhibitor use   3. Osteopenia, unspecified location    # ypT2N1a M0 disease, ER+PR+, HER2-. Residual disease after neoadjuvant chemotherapy s/p RT/  Clinically she is doing well.  No evidence of disease recurrence. Labs reviewed and discussed with patient. Continue letrozole. Obtain annual screening mammogram unilateral left July 2022  #Osteopenia, on Zometa every 6 months. Proceed with Zometa today. Continue calcium and vitamin D supplementation.  . # Discussed with patient about diet, exercise and weight loss.  Continue follow-up with primary care provider for glycemic control, cholesterol monitoring.  Follow up in 6 months. Earlie Server, MD, PhD Hematology Oncology [place] at [facility] 11/15/2020

## 2020-11-15 NOTE — Progress Notes (Signed)
Patient denies new problems/concerns today.   °

## 2020-11-15 NOTE — Progress Notes (Signed)
Patient tolerated infusion well. Discharged home.  

## 2020-11-15 NOTE — Addendum Note (Signed)
Addended by: Earlie Server on: 11/15/2020 01:49 PM   Modules accepted: Orders

## 2021-01-02 ENCOUNTER — Other Ambulatory Visit: Payer: Self-pay | Admitting: Oncology

## 2021-05-09 ENCOUNTER — Ambulatory Visit
Admission: RE | Admit: 2021-05-09 | Discharge: 2021-05-09 | Disposition: A | Discharge status: Home / Self Care | Payer: Medicare HMO | Source: Ambulatory Visit | Attending: Oncology | Admitting: Oncology

## 2021-05-09 ENCOUNTER — Other Ambulatory Visit: Payer: Self-pay

## 2021-05-09 DIAGNOSIS — C50412 Malignant neoplasm of upper-outer quadrant of left female breast: Secondary | ICD-10-CM | POA: Insufficient documentation

## 2021-05-09 DIAGNOSIS — Z170 Estrogen receptor positive status [ER+]: Secondary | ICD-10-CM

## 2021-05-09 DIAGNOSIS — Z1231 Encounter for screening mammogram for malignant neoplasm of breast: Secondary | ICD-10-CM | POA: Diagnosis not present

## 2021-05-13 ENCOUNTER — Other Ambulatory Visit: Payer: Self-pay

## 2021-05-13 DIAGNOSIS — C50412 Malignant neoplasm of upper-outer quadrant of left female breast: Secondary | ICD-10-CM

## 2021-05-17 ENCOUNTER — Inpatient Hospital Stay: Payer: Medicare HMO

## 2021-05-20 ENCOUNTER — Inpatient Hospital Stay: Payer: Medicare HMO | Attending: Oncology

## 2021-05-20 ENCOUNTER — Inpatient Hospital Stay: Payer: Medicare HMO | Admitting: Oncology

## 2021-05-20 ENCOUNTER — Inpatient Hospital Stay: Payer: Medicare HMO

## 2021-05-20 DIAGNOSIS — Z803 Family history of malignant neoplasm of breast: Secondary | ICD-10-CM | POA: Diagnosis not present

## 2021-05-20 DIAGNOSIS — M8580 Other specified disorders of bone density and structure, unspecified site: Secondary | ICD-10-CM | POA: Diagnosis not present

## 2021-05-20 DIAGNOSIS — Z170 Estrogen receptor positive status [ER+]: Secondary | ICD-10-CM | POA: Insufficient documentation

## 2021-05-20 DIAGNOSIS — Z7984 Long term (current) use of oral hypoglycemic drugs: Secondary | ICD-10-CM | POA: Diagnosis not present

## 2021-05-20 DIAGNOSIS — Z8542 Personal history of malignant neoplasm of other parts of uterus: Secondary | ICD-10-CM | POA: Diagnosis not present

## 2021-05-20 DIAGNOSIS — Z90710 Acquired absence of both cervix and uterus: Secondary | ICD-10-CM | POA: Insufficient documentation

## 2021-05-20 DIAGNOSIS — Z9221 Personal history of antineoplastic chemotherapy: Secondary | ICD-10-CM | POA: Diagnosis not present

## 2021-05-20 DIAGNOSIS — C50412 Malignant neoplasm of upper-outer quadrant of left female breast: Secondary | ICD-10-CM | POA: Diagnosis present

## 2021-05-20 DIAGNOSIS — E119 Type 2 diabetes mellitus without complications: Secondary | ICD-10-CM | POA: Diagnosis not present

## 2021-05-20 DIAGNOSIS — Z800 Family history of malignant neoplasm of digestive organs: Secondary | ICD-10-CM | POA: Diagnosis not present

## 2021-05-20 DIAGNOSIS — Z79811 Long term (current) use of aromatase inhibitors: Secondary | ICD-10-CM | POA: Diagnosis not present

## 2021-05-20 DIAGNOSIS — Z79899 Other long term (current) drug therapy: Secondary | ICD-10-CM | POA: Diagnosis not present

## 2021-05-20 DIAGNOSIS — Z923 Personal history of irradiation: Secondary | ICD-10-CM | POA: Insufficient documentation

## 2021-05-20 DIAGNOSIS — I781 Nevus, non-neoplastic: Secondary | ICD-10-CM | POA: Diagnosis not present

## 2021-05-20 LAB — COMPREHENSIVE METABOLIC PANEL
ALT: 11 U/L (ref 0–44)
AST: 20 U/L (ref 15–41)
Albumin: 3.8 g/dL (ref 3.5–5.0)
Alkaline Phosphatase: 69 U/L (ref 38–126)
Anion gap: 10 (ref 5–15)
BUN: 18 mg/dL (ref 8–23)
CO2: 27 mmol/L (ref 22–32)
Calcium: 9.6 mg/dL (ref 8.9–10.3)
Chloride: 98 mmol/L (ref 98–111)
Creatinine, Ser: 0.85 mg/dL (ref 0.44–1.00)
GFR, Estimated: >60 mL/min (ref >60)
Glucose, Bld: 187 mg/dL — ABNORMAL HIGH (ref 70–99)
Potassium: 3.9 mmol/L (ref 3.5–5.1)
Sodium: 135 mmol/L (ref 135–145)
Total Bilirubin: 0.3 mg/dL (ref 0.3–1.2)
Total Protein: 7.3 g/dL (ref 6.5–8.1)

## 2021-05-20 LAB — CBC WITH DIFFERENTIAL/PLATELET
Abs Immature Granulocytes: 0.09 10*3/uL — ABNORMAL HIGH (ref 0.00–0.07)
Basophils Absolute: 0.1 10*3/uL (ref 0.0–0.1)
Basophils Relative: 1 %
Eosinophils Absolute: 0.3 10*3/uL (ref 0.0–0.5)
Eosinophils Relative: 3 %
HCT: 36.9 % (ref 36.0–46.0)
Hemoglobin: 12.4 g/dL (ref 12.0–15.0)
Immature Granulocytes: 1 %
Lymphocytes Relative: 18 %
Lymphs Abs: 1.5 10*3/uL (ref 0.7–4.0)
MCH: 29 pg (ref 26.0–34.0)
MCHC: 33.6 g/dL (ref 30.0–36.0)
MCV: 86.4 fL (ref 80.0–100.0)
Monocytes Absolute: 0.8 10*3/uL (ref 0.1–1.0)
Monocytes Relative: 9 %
Neutro Abs: 6 10*3/uL (ref 1.7–7.7)
Neutrophils Relative %: 68 %
Platelets: 310 10*3/uL (ref 150–400)
RBC: 4.27 MIL/uL (ref 3.87–5.11)
RDW: 13.5 % (ref 11.5–15.5)
WBC: 8.7 10*3/uL (ref 4.0–10.5)
nRBC: 0 % (ref 0.0–0.2)

## 2021-05-21 LAB — CANCER ANTIGEN 15-3: CA 15-3: 25.1 U/mL — ABNORMAL HIGH (ref 0.0–25.0)

## 2021-05-21 LAB — CANCER ANTIGEN 27.29: CA 27.29: 25.5 U/mL (ref 0.0–38.6)

## 2021-05-24 ENCOUNTER — Encounter: Payer: Self-pay | Admitting: Oncology

## 2021-05-24 ENCOUNTER — Inpatient Hospital Stay (HOSPITAL_BASED_OUTPATIENT_CLINIC_OR_DEPARTMENT_OTHER): Payer: Medicare HMO | Admitting: Oncology

## 2021-05-24 ENCOUNTER — Inpatient Hospital Stay: Payer: Medicare HMO

## 2021-05-24 VITALS — BP 129/62 | HR 93 | Temp 98.3°F | Resp 18 | Ht <= 58 in | Wt 228.0 lb

## 2021-05-24 DIAGNOSIS — M8580 Other specified disorders of bone density and structure, unspecified site: Secondary | ICD-10-CM

## 2021-05-24 DIAGNOSIS — Z79811 Long term (current) use of aromatase inhibitors: Secondary | ICD-10-CM

## 2021-05-24 DIAGNOSIS — E860 Dehydration: Secondary | ICD-10-CM

## 2021-05-24 DIAGNOSIS — I781 Nevus, non-neoplastic: Secondary | ICD-10-CM | POA: Diagnosis not present

## 2021-05-24 DIAGNOSIS — C50412 Malignant neoplasm of upper-outer quadrant of left female breast: Secondary | ICD-10-CM | POA: Diagnosis not present

## 2021-05-24 DIAGNOSIS — Z170 Estrogen receptor positive status [ER+]: Secondary | ICD-10-CM

## 2021-05-24 MED ORDER — SODIUM CHLORIDE 0.9 % IV SOLN
Freq: Once | INTRAVENOUS | Status: AC
  Filled 2021-05-24: qty 250

## 2021-05-24 MED ORDER — ZOLEDRONIC ACID 4 MG/100ML IV SOLN
4.0000 mg | Freq: Once | INTRAVENOUS | Status: AC
  Administered 2021-05-24: 4 mg via INTRAVENOUS
  Filled 2021-05-24: qty 100

## 2021-05-24 NOTE — Patient Instructions (Signed)
[facility] CENTER [place] [facility]  Discharge Instructions: Thank you for choosing [facility] to provide your oncology and hematology care.  If you have a lab appointment with the Cancer Center, please go directly to the Cancer Center and check in at the registration area.  Wear comfortable clothing and clothing appropriate for easy access to any Portacath or PICC line.   We strive to give you quality time with your provider. You may need to reschedule your appointment if you arrive late (15 or more minutes).  Arriving late affects you and other patients whose appointments are after yours.  Also, if you miss three or more appointments without notifying the office, you may be dismissed from the clinic at the provider's discretion.      For prescription refill requests, have your pharmacy contact our office and allow 72 hours for refills to be completed.    Today you received the following : Zometa    To help prevent nausea and vomiting after your treatment, we encourage you to take your nausea medication as directed.  BELOW ARE SYMPTOMS THAT SHOULD BE REPORTED IMMEDIATELY: . *FEVER GREATER THAN 100.4 F (38 C) OR HIGHER . *CHILLS OR SWEATING . *NAUSEA AND VOMITING THAT IS NOT CONTROLLED WITH YOUR NAUSEA MEDICATION . *UNUSUAL SHORTNESS OF BREATH . *UNUSUAL BRUISING OR BLEEDING . *URINARY PROBLEMS (pain or burning when urinating, or frequent urination) . *BOWEL PROBLEMS (unusual diarrhea, constipation, pain near the anus) . TENDERNESS IN MOUTH AND THROAT WITH OR WITHOUT PRESENCE OF ULCERS (sore throat, sores in mouth, or a toothache) . UNUSUAL RASH, SWELLING OR PAIN  . UNUSUAL VAGINAL DISCHARGE OR ITCHING   Items with * indicate a potential emergency and should be followed up as soon as possible or go to the Emergency Department if any problems should occur.  Please show the CHEMOTHERAPY ALERT CARD or IMMUNOTHERAPY ALERT CARD at check-in to the Emergency  Department and triage nurse.  Should you have questions after your visit or need to cancel or reschedule your appointment, please contact [facility]CER CENTER [place] [facility]  [phone] and follow the prompts.  Office hours are 8:00 a.m. to 4:30 p.m. Monday - Friday. Please note that voicemails left after 4:00 p.m. may not be returned until the following business day.  We are closed weekends and major holidays. You have access to a nurse at all times for urgent questions. Please call the main number to the clinic [phone] and follow the prompts.  For any non-urgent questions, you may also contact your provider using MyChart. We now offer e-Visits for anyone 18 and older to request care online for non-urgent symptoms. For details visit [email or web address].   Also download the MyChart app! Go to the app store, search "MyChart", open the app, select [facility], and log in with your MyChart username and password.  Due to Covid, a mask is required upon entering the hospital/clinic. If you do not have a mask, one will be given to you upon arrival. For doctor visits, patients may have 1 support person aged 18 or older with them. For treatment visits, patients cannot have anyone with them due to current Covid guidelines and our immunocompromised population.  

## 2021-05-24 NOTE — Progress Notes (Signed)
[facility] Follow Up Visit.  Patient Care Team: Tracie Harrier, MD as PCP - General (Internal Medicine) Rico Junker, RN as Oncology Nurse Navigator Earlie Server, MD as Consulting Physician (Oncology) Leonie Green, MD as Referring Physician (Surgery) Noreene Filbert, MD as Referring Physician (Radiation Oncology)  Reason for visit Follow up for management of Stage IIA left breast cancer.   Pertinent Oncology History DEADRA DIGGINS 70 y.o. female with PMH listed as below is referred by Dr.Schermerhorn to [name] for evaluation and management of newly diagnosed breast cancer. Patient had a history a left breast mass biopsy followed by breast excisonal biopsy in 2015 with benign pathology.  She also has a history of Uterine cancer which was treated with RT with Dr.Chrystal.  She underwent routine screening mammogram on 04/17/2017 which recommended a possible mass on the left breast. Left diagnostic mammogram showed a highly suspicious left breast mass at 4 o'clock, 7 cm from the nipple and 1 o'clock,,  About 3 cm from the nipple. Enlarged left axillary lymph node suspicious for metastatic disease.  1 US breast confirmed a left breast mass of 2.1cm, 4 o'clock, and 1.1 cm mass at 1 o'clock.The masses at 1 and 4 o'clock are separated by approximately 4 cm sonographically 2 Biopsy of the two mass and axillary lymph node revealed invasive carcinoma, grade 2. Both are ER,PR positive. HER2 equivocal, FISH pending. 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  3 05/14/2017 MRI breast showed Two sites of biopsy proven malignancy seen in the left breast, which span up to 11.3 cm. There are intervening areas of non mass enhancement and a small suspicious enhancing mass. 4 Case was discussed on breast tumor board on 05/21/2017 and the panel agree with neoadjuvant ddAC to T followed by surgery. Clinically cT3N1M0, Stage IIA  # Genetic testing Testing did not reveal a  pathogenic mutation in any of the genes analyzed.  A copy of the genetic test report will be scanned into Epic under the Media tab.The genes analyzed were the 83 genes on [facility]'s Multi-Cancer panel (ALK, APC, ATM, AXIN2, BAP1, BARD1, BLM, BMPR1A, BRCA1, BRCA2, BRIP1, CASR, CDC73, CDH1, CDK4, CDKN1B, CDKN1C, CDKN2A, CEBPA, CHEK2, CTNNA1, DICER1, DIS3L2, EGFR, EPCAM, FH, FLCN, GATA2, GPC3, GREM1, HOXB13, HRAS, KIT, MAX, MEN1, MET, MITF, MLH1, MSH2, MSH3, MSH6, MUTYH, NBN, NF1, NF2, NTHL1, PALB2, PDGFRA, PHOX2B, PMS2, POLD1, POLE, POT1, PRKAR1A, PTCH1, PTEN, RAD50, RAD51C, RAD51D, RB1, RECQL4, RET, RUNX1, SDHA, SDHAF2, SDHB, SDHC, SDHD, SMAD4, SMARCA4, SMARCB1, SMARCE1, STK11, SUFU, TERC, TERT, TMEM127, TP53, TSC1, TSC2, VHL, WRN, WT1).   #Cancer treatment  Neoadjuvant chemotherapy 05/23/2017- 10/03/2017 patient completed s/p 4 cycles ddAC and weekly taxol x 12, on 10/26/2017 patient underwent that surgery.  Left axillary sentinel lymph node was positive, so Dr. Tamala Julian proceeded with left mastectomy and left axillary lymph node dissection. Pathology showed ypT2(m)  pN1a, ER/PR positive, HER2 FISH negative, grade 2, negative margin, invasive mammary carcinoma.  S/p adjuvant RT.  Declined NATALEE Trial Clinical trial for pabociclib and letrozole.   # May 2019 Started on Letrozole  # Mediport removed 09/18/2018  History of uterine cancer.  INTERVAL HISTORY Patient presents for follow up for history of breast cancer ypT2 yp N1, s/p neoadjuvant chemotherapy and surgery ad adjuvant RT.  Patient presents for 67-month follow-up. She feels well. Chronic intermittent pain at the site of left mastectomy. She takes letrozole.  Manageable hot flshes.  + Hair loss.   Review of Systems  Constitutional:  Negative  for appetite change, chills, fatigue and fever.  HENT:   Negative for hearing loss and voice change.   Eyes:  Negative for eye problems.  Respiratory:  Negative for chest tightness and cough.    Cardiovascular:  Negative for chest pain.  Gastrointestinal:  Negative for abdominal distention, abdominal pain and blood in stool.  Endocrine: Positive for hot flashes.  Genitourinary:  Negative for difficulty urinating and frequency.   Musculoskeletal:  Negative for arthralgias.  Skin:  Negative for itching and rash.  Neurological:  Negative for extremity weakness.  Hematological:  Negative for adenopathy.  Psychiatric/Behavioral:  Negative for confusion.    MEDICAL HISTORY: Past Medical History:  Diagnosis Date   Anemia    Arthritis    HIP-LEFT   Benign neoplasm of colon    Breast cancer ([place]) 2018   left breast   Cancer ([place])    Diabetes mellitus without complication ([place])    Genetic testing 01/10/2018   Multi-Cancer panel (83 genes) @ [facility] - No pathogenic mutations detected   H/O osteopenia    History of hiatal hernia    Hypertension    Personal history of chemotherapy    Personal history of radiation therapy    Sleep apnea    USE CPAP   Trigger finger, acquired    Uterine cancer ([place])    Uterine cancer ([place])     SURGICAL HISTORY: Past Surgical History:  Procedure Laterality Date   ABDOMINAL HYSTERECTOMY     BREAST BIOPSY Left 04/2014   negative   BREAST EXCISIONAL BIOPSY Left 2015   CESAREAN SECTION     X2   COLONOSCOPY WITH PROPOFOL N/A 04/15/2015   Procedure: COLONOSCOPY WITH PROPOFOL;  Surgeon: Hulen Luster, MD;  Location: [facility] ENDOSCOPY;  Service: Gastroenterology;  Laterality: N/A;   COLONOSCOPY WITH PROPOFOL N/A 08/23/2020   Procedure: COLONOSCOPY WITH PROPOFOL;  Surgeon: Toledo, Benay Pike, MD;  Location: [facility] ENDOSCOPY;  Service: Gastroenterology;  Laterality: N/A;   HERNIA REPAIR     MASTECTOMY Left 10/2017   MASTECTOMY MODIFIED RADICAL Left 10/26/2017   Procedure: MASTECTOMY MODIFIED RADICAL;  Surgeon: Leonie Green, MD;  Location: [facility] ORS;  Service: General;  Laterality: Left;   MASTECTOMY W/ SENTINEL NODE BIOPSY Left 10/26/2017   Procedure:  MASTECTOMY WITH SENTINEL LYMPH NODE BIOPSY;  Surgeon: Leonie Green, MD;  Location: [facility] ORS;  Service: General;  Laterality: Left;   PORTACATH PLACEMENT Right 05/22/2017   Procedure: INSERTION PORT-A-CATH;  Surgeon: Leonie Green, MD;  Location: [facility] ORS;  Service: General;  Laterality: Right;    SOCIAL HISTORY: Social History   Socioeconomic History   Marital status: Married    Spouse name: Not on file   Number of children: Not on file   Years of education: Not on file   Highest education level: Not on file  Occupational History   Not on file  Tobacco Use   Smoking status: Never   Smokeless tobacco: Never  Vaping Use   Vaping Use: Never used  Substance and Sexual Activity   Alcohol use: No   Drug use: No   Sexual activity: Not on file  Other Topics Concern   Not on file  Social History Narrative   Not on file   Social Determinants of Health   Financial Resource Strain: Not on file  Food Insecurity: Not on file  Transportation Needs: Not on file  Physical Activity: Not on file  Stress: Not on file  Social Connections: Not on file  Intimate  Partner Violence: Not on file    FAMILY HISTORY Family History  Problem Relation Age of Onset   Diabetes Mother    Diabetes Father    Melanoma Father 56       on finger   Diabetes Sister    Pancreatic cancer Paternal Grandfather        deceased 78s   Breast cancer Other 74       paternal grandmother's mother    ALLERGIES:  is allergic to crestor [rosuvastatin].  MEDICATIONS:  Current Outpatient Medications  Medication Sig Dispense Refill   Ascorbic Acid (VITAMIN C) 100 MG tablet Take 100 mg by mouth daily.     Biotin 10 MG TABS Take by mouth.     Calcium Carb-Cholecalciferol (CALCIUM 600-D PO) Take 1 tablet by mouth daily.     Cinnamon 500 MG capsule Take 1,000 mg by mouth daily.      [employer] B6 100 MG tablet TAKE 1 TABLET BY MOUTH EVERY DAY 100 tablet 3   cyanocobalamin 1000 MCG tablet Take 1,000 mcg by  mouth daily.     diphenhydrAMINE (BENADRYL) 25 MG tablet Take 25 mg by mouth every 6 (six) hours as needed for allergies.      ferrous sulfate 325 (65 FE) MG tablet Take 325 mg by mouth daily with breakfast. OTC     GLUCOSAMINE-CHONDROITIN PO Take 1 tablet by mouth daily.     letrozole (FEMARA) 2.5 MG tablet TAKE 1 TABLET BY MOUTH EVERY DAY 90 tablet 1   lisinopril-hydrochlorothiazide (PRINZIDE,ZESTORETIC) 20-12.5 MG per tablet Take 1 tablet by mouth daily.     loperamide (IMODIUM) 2 MG capsule Take 1 capsule (2 mg total) by mouth See admin instructions. With onset of loose stool, take 4mg  followed by 2mg  every 2 hours until 12 hours have passed without loose bowel movement. Maximum: 16 mg/day 120 capsule 1   meloxicam (MOBIC) 15 MG tablet Take 15 mg by mouth daily as needed.     metFORMIN (GLUCOPHAGE) 500 MG tablet Take 1,000 mg by mouth 2 (two) times daily.      Multiple Vitamin (MULTIVITAMIN) tablet Take 1 tablet by mouth daily.     pioglitazone (ACTOS) 30 MG tablet Take 30 mg by mouth daily.     Semaglutide,0.25 or 0.5MG /DOS, 2 MG/1.5ML SOPN Inject into the skin.     trospium (SANCTURA) 20 MG tablet Take 20 mg by mouth 2 (two) times daily.     No current facility-administered medications for this visit.    PHYSICAL EXAMINATION:  ECOG PERFORMANCE STATUS: 1 - Symptomatic but completely ambulatory  Vitals:   05/24/21 1008  BP: 129/62  Pulse: 93  Resp: 18  Temp: 98.3 F (36.8 C)  SpO2: 99%    Filed Weights   05/24/21 1008  Weight: 228 lb (103.4 kg)     Physical Exam Constitutional:      General: She is not in acute distress.    Appearance: She is not diaphoretic.     Comments: Obese,   HENT:     Head: Normocephalic and atraumatic.     Nose: Nose normal.     Mouth/Throat:     Pharynx: No oropharyngeal exudate.  Eyes:     General: No scleral icterus.    Pupils: Pupils are equal, round, and reactive to light.  Cardiovascular:     Rate and Rhythm: Normal rate and  regular rhythm.     Heart sounds: No murmur heard. Pulmonary:     Effort: Pulmonary effort is  normal. No respiratory distress.     Breath sounds: No rales.  Chest:     Chest wall: No tenderness.  Abdominal:     General: There is no distension.     Palpations: Abdomen is soft.     Tenderness: There is no abdominal tenderness.  Musculoskeletal:        General: Normal range of motion.     Cervical back: Normal range of motion and neck supple.  Skin:    General: Skin is warm and dry.     Findings: No erythema.  Neurological:     Mental Status: She is alert and oriented to person, place, and time.     Cranial Nerves: No cranial nerve deficit.     Motor: No abnormal muscle tone.     Coordination: Coordination normal.  Psychiatric:        Mood and Affect: Affect normal.  History of left mastectomy.  No palpable chest wall mass. Radiation-induced telangiectasias.  Mild erythematous  desquamation  of skin fold.       LABORATORY DATA: CBC Latest Ref Rng & Units 05/20/2021 11/15/2020 05/10/2020  WBC 4.0 - 10.5 K/uL 8.7 8.9 7.6  Hemoglobin 12.0 - 15.0 g/dL 12.4 12.3 12.2  Hematocrit 36.0 - 46.0 % 36.9 37.4 36.6  Platelets 150 - 400 K/uL 310 312 309   CMP Latest Ref Rng & Units 05/20/2021 11/15/2020 05/10/2020  Glucose 70 - 99 mg/dL 187(H) 164(H) 187(H)  BUN 8 - 23 mg/dL _0 Creatinine 0.44 - 1.00 mg/dL 0.85 0.77 0.75  Sodium 135 - 145 mmol/L 135 135 139  Potassium 3.5 - 5.1 mmol/L 3.9 4.2 3.7  Chloride 98 - 111 mmol/L 98 99 104  CO2 22 - 32 mmol/L _1 Calcium 8.9 - 10.3 mg/dL 9.6 9.5 9.3  Total Protein 6.5 - 8.1 g/dL 7.3 7.2 7.1  Total Bilirubin 0.3 - 1.2 mg/dL 0.3 0.4 0.7  Alkaline Phos 38 - 126 U/L 69 65 60  AST 15 - 41 U/L _2 ALT 0 - 44 U/L _3 RADIOGRAPHIC STUDIES: I have personally reviewed the radiological images as listed and agreed with the findings in the report. MM 3D SCREEN BREAST UNI RIGHT  Result Date: 05/10/2021 CLINICAL DATA:   Screening. EXAM: DIGITAL SCREENING UNILATERAL RIGHT MAMMOGRAM WITH CAD AND TOMOSYNTHESIS TECHNIQUE: Right screening digital craniocaudal and mediolateral oblique mammograms were obtained. Right screening digital breast tomosynthesis was performed. The images were evaluated with computer-aided detection. COMPARISON:  Previous exam(s). ACR Breast Density Category b: There are scattered areas of fibroglandular density. FINDINGS: The patient has had a left mastectomy. There are no findings suspicious for malignancy. IMPRESSION: No mammographic evidence of malignancy. A result letter of this screening mammogram will be mailed directly to the patient. RECOMMENDATION: Screening mammogram in one year.  (Code:SM-R-30M) BI-RADS CATEGORY  1: Negative. Electronically Signed   By: Ammie Ferrier M.D.   On: 05/10/2021 16:42  .   ASSESSMENT/PLAN 70 y.o. female presents with cT3cN1cMo 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  S.p post left modified radical mastectomy with ALND, s/p radiation.   1. Malignant neoplasm of upper-outer quadrant of left breast in female, estrogen receptor positive ([place])   2. Aromatase inhibitor use   3. Osteopenia, unspecified location   4. Telangiectasia of skin    #  ypT2N1a M0 disease, ER+PR+, HER2-. Residual disease after neoadjuvant chemotherapy s/p RT/  Clinically she is doing well.  No evidence of disease recurrence. Labs are reviewed and discussed with patient. Continue Letrozole Annual screening mammogram of right breast reviewed and discussed with patient.  CA 15.3 25.1 , repeat in 3 months  #Osteopenia, on Zometa every 6 months. Proceed with Zometa today.  Continue calcium and vitamin D supplementation.  # Radiation dermatitis with telangiectasias.  Monitor clinically. Follow up with Radonc   Repeat CA 15.3 in 3 months.  Follow up in 6 months.  Lab md   Earlie Server, MD, PhD Hematology Oncology [place] at [facility] 05/24/2021

## 2021-06-15 ENCOUNTER — Encounter: Payer: Self-pay | Admitting: *Deleted

## 2021-06-27 ENCOUNTER — Other Ambulatory Visit: Payer: Self-pay | Admitting: Oncology

## 2021-08-24 ENCOUNTER — Other Ambulatory Visit: Payer: Self-pay

## 2021-08-24 ENCOUNTER — Inpatient Hospital Stay: Payer: Medicare HMO | Attending: Oncology

## 2021-08-24 DIAGNOSIS — C50412 Malignant neoplasm of upper-outer quadrant of left female breast: Secondary | ICD-10-CM | POA: Insufficient documentation

## 2021-08-24 DIAGNOSIS — Z79811 Long term (current) use of aromatase inhibitors: Secondary | ICD-10-CM | POA: Diagnosis not present

## 2021-08-24 DIAGNOSIS — M8580 Other specified disorders of bone density and structure, unspecified site: Secondary | ICD-10-CM | POA: Insufficient documentation

## 2021-08-24 DIAGNOSIS — Z170 Estrogen receptor positive status [ER+]: Secondary | ICD-10-CM | POA: Diagnosis not present

## 2021-08-24 DIAGNOSIS — Z9012 Acquired absence of left breast and nipple: Secondary | ICD-10-CM | POA: Diagnosis not present

## 2021-08-24 LAB — CBC WITH DIFFERENTIAL/PLATELET
Abs Immature Granulocytes: 0.07 10*3/uL (ref 0.00–0.07)
Basophils Absolute: 0.1 10*3/uL (ref 0.0–0.1)
Basophils Relative: 1 %
Eosinophils Absolute: 0.2 10*3/uL (ref 0.0–0.5)
Eosinophils Relative: 2 %
HCT: 36.4 % (ref 36.0–46.0)
Hemoglobin: 12.2 g/dL (ref 12.0–15.0)
Immature Granulocytes: 1 %
Lymphocytes Relative: 15 %
Lymphs Abs: 1.5 10*3/uL (ref 0.7–4.0)
MCH: 29.5 pg (ref 26.0–34.0)
MCHC: 33.5 g/dL (ref 30.0–36.0)
MCV: 87.9 fL (ref 80.0–100.0)
Monocytes Absolute: 0.8 10*3/uL (ref 0.1–1.0)
Monocytes Relative: 8 %
Neutro Abs: 7.3 10*3/uL (ref 1.7–7.7)
Neutrophils Relative %: 73 %
Platelets: 316 10*3/uL (ref 150–400)
RBC: 4.14 MIL/uL (ref 3.87–5.11)
RDW: 13.6 % (ref 11.5–15.5)
WBC: 9.8 10*3/uL (ref 4.0–10.5)
nRBC: 0 % (ref 0.0–0.2)

## 2021-08-24 LAB — COMPREHENSIVE METABOLIC PANEL
ALT: 12 U/L (ref 0–44)
AST: 18 U/L (ref 15–41)
Albumin: 3.9 g/dL (ref 3.5–5.0)
Alkaline Phosphatase: 65 U/L (ref 38–126)
Anion gap: 12 (ref 5–15)
BUN: 16 mg/dL (ref 8–23)
CO2: 24 mmol/L (ref 22–32)
Calcium: 9 mg/dL (ref 8.9–10.3)
Chloride: 89 mmol/L — ABNORMAL LOW (ref 98–111)
Creatinine, Ser: 0.91 mg/dL (ref 0.44–1.00)
GFR, Estimated: >60 mL/min (ref >60)
Glucose, Bld: 138 mg/dL — ABNORMAL HIGH (ref 70–99)
Potassium: 3.7 mmol/L (ref 3.5–5.1)
Sodium: 125 mmol/L — ABNORMAL LOW (ref 135–145)
Total Bilirubin: 0.3 mg/dL (ref 0.3–1.2)
Total Protein: 7.5 g/dL (ref 6.5–8.1)

## 2021-08-25 LAB — CANCER ANTIGEN 15-3: CA 15-3: 24.1 U/mL (ref 0.0–25.0)

## 2021-08-29 ENCOUNTER — Other Ambulatory Visit: Payer: Self-pay

## 2021-08-29 ENCOUNTER — Telehealth: Payer: Self-pay

## 2021-08-29 DIAGNOSIS — C50412 Malignant neoplasm of upper-outer quadrant of left female breast: Secondary | ICD-10-CM

## 2021-08-29 DIAGNOSIS — Z170 Estrogen receptor positive status [ER+]: Secondary | ICD-10-CM

## 2021-08-29 NOTE — Telephone Encounter (Signed)
-----   Message from Earlie Server, MD sent at 08/27/2021  3:13 PM EST ----- Let her know that tumor mark is decreased, within normal range.  Looks like her other lab orders were also pulled and please check if she needs new lab orders placed.  Let her know that her sodium is low, maybe related to her BP meds. Ask her to check with her PCP

## 2021-08-29 NOTE — Telephone Encounter (Signed)
Unable to reach pt by phone. Mychart message sent to inform pt of results. Lab orders for future appt re entered.

## 2021-09-19 ENCOUNTER — Ambulatory Visit: Payer: Medicare HMO | Admitting: Radiation Oncology

## 2021-09-26 ENCOUNTER — Ambulatory Visit
Admission: RE | Admit: 2021-09-26 | Discharge: 2021-09-26 | Disposition: A | Discharge status: Home / Self Care | Payer: Medicare HMO | Source: Ambulatory Visit | Attending: Radiation Oncology | Admitting: Radiation Oncology

## 2021-09-26 ENCOUNTER — Encounter: Payer: Self-pay | Admitting: Radiation Oncology

## 2021-09-26 ENCOUNTER — Other Ambulatory Visit: Payer: Self-pay

## 2021-09-26 VITALS — BP 144/73 | Temp 98.6°F | Wt 227.3 lb

## 2021-09-26 DIAGNOSIS — Z79811 Long term (current) use of aromatase inhibitors: Secondary | ICD-10-CM | POA: Insufficient documentation

## 2021-09-26 DIAGNOSIS — Z923 Personal history of irradiation: Secondary | ICD-10-CM | POA: Insufficient documentation

## 2021-09-26 DIAGNOSIS — C50812 Malignant neoplasm of overlapping sites of left female breast: Secondary | ICD-10-CM | POA: Insufficient documentation

## 2021-09-26 DIAGNOSIS — Z170 Estrogen receptor positive status [ER+]: Secondary | ICD-10-CM | POA: Diagnosis not present

## 2021-09-26 NOTE — Progress Notes (Signed)
Radiation Oncology Follow up Note  Name: Dominique Guzman   Date:   09/26/2021 MRN:  [id number] DOB: [birth year]    This 70 y.o. female presents to the clinic today for over 3 and half year follow-up status post left chest wall and peripheral lymphatic radiation therapy for a T2N1 ER/PR positive HER2 negative invasive mammary carcinoma of the left breast status post left modified radical mastectomy.  REFERRING PROVIDER: Tracie Harrier, MD  HPI: Patient is a 70 year old female now out over 3 and half years having completed radiation therapy to her left chest wall and peripheral lymphatics for a T2N1 ER/PR positive invasive mammary carcinoma seen today in routine follow-up she is doing well specifically denies any new nodularity in the left chest wall any pain or tenderness.  She is having no swelling in her left upper extremity..  She had a mammogram of her right breast which I have reviewed back in August which was BI-RADS 1 negative.  She is currently on letrozole tolerating that well without side effect.  COMPLICATIONS OF TREATMENT: none  FOLLOW UP COMPLIANCE: keeps appointments   PHYSICAL EXAM:  BP (!) 144/73 (BP Location: Right Arm, Patient Position: Sitting)    Temp 98.6 F (37 C)    Wt 227 lb 4.8 oz (103.1 kg)    LMP  (LMP Unknown)    BMI 49.19 kg/m  Patient is status post left modified radical mastectomy.  There is scarring of the left chest wall and changes consistent with radiation although not no nodularity or masses identified.  Right breast is free of dominant mass.  No axillary or supraclavicular adenopathy is appreciated.  There is no left upper extremity lymphedema.  Well-developed well-nourished patient in NAD. HEENT reveals PERLA, EOMI, discs not visualized.  Oral cavity is clear. No oral mucosal lesions are identified. Neck is clear without evidence of cervical or supraclavicular adenopathy. Lungs are clear to A&P. Cardiac examination is essentially unremarkable with  regular rate and rhythm without murmur rub or thrill. Abdomen is benign with no organomegaly or masses noted. Motor sensory and DTR levels are equal and symmetric in the upper and lower extremities. Cranial nerves II through XII are grossly intact. Proprioception is intact. No peripheral adenopathy or edema is identified. No motor or sensory levels are noted. Crude visual fields are within normal range.  RADIOLOGY RESULTS: Mammogram of right breast reviewed  PLAN: Present time patient is now out close to 4 years with no evidence of disease.  Going to discontinue follow-up care.  Patient continues follow-up care with medical oncology.  Patient knows to call at anytime with any concerns.  I would like to take this opportunity to thank you for allowing me to participate in the care of your patient.Noreene Filbert, MD

## 2021-11-25 ENCOUNTER — Encounter: Payer: Self-pay | Admitting: Oncology

## 2021-11-25 ENCOUNTER — Inpatient Hospital Stay: Payer: Medicare HMO

## 2021-11-25 ENCOUNTER — Other Ambulatory Visit: Payer: Self-pay

## 2021-11-25 ENCOUNTER — Inpatient Hospital Stay (HOSPITAL_BASED_OUTPATIENT_CLINIC_OR_DEPARTMENT_OTHER): Payer: Medicare HMO | Admitting: Oncology

## 2021-11-25 ENCOUNTER — Inpatient Hospital Stay: Payer: Medicare HMO | Attending: Oncology

## 2021-11-25 VITALS — BP 173/95 | HR 102 | Temp 99.0°F | Wt 235.0 lb

## 2021-11-25 DIAGNOSIS — M8580 Other specified disorders of bone density and structure, unspecified site: Secondary | ICD-10-CM | POA: Insufficient documentation

## 2021-11-25 DIAGNOSIS — Z8542 Personal history of malignant neoplasm of other parts of uterus: Secondary | ICD-10-CM | POA: Diagnosis not present

## 2021-11-25 DIAGNOSIS — Z79899 Other long term (current) drug therapy: Secondary | ICD-10-CM | POA: Diagnosis not present

## 2021-11-25 DIAGNOSIS — Z7984 Long term (current) use of oral hypoglycemic drugs: Secondary | ICD-10-CM | POA: Insufficient documentation

## 2021-11-25 DIAGNOSIS — Z79811 Long term (current) use of aromatase inhibitors: Secondary | ICD-10-CM | POA: Insufficient documentation

## 2021-11-25 DIAGNOSIS — E860 Dehydration: Secondary | ICD-10-CM

## 2021-11-25 DIAGNOSIS — L598 Other specified disorders of the skin and subcutaneous tissue related to radiation: Secondary | ICD-10-CM | POA: Diagnosis not present

## 2021-11-25 DIAGNOSIS — Z170 Estrogen receptor positive status [ER+]: Secondary | ICD-10-CM

## 2021-11-25 DIAGNOSIS — Z923 Personal history of irradiation: Secondary | ICD-10-CM | POA: Insufficient documentation

## 2021-11-25 DIAGNOSIS — C50412 Malignant neoplasm of upper-outer quadrant of left female breast: Secondary | ICD-10-CM | POA: Insufficient documentation

## 2021-11-25 DIAGNOSIS — Z9012 Acquired absence of left breast and nipple: Secondary | ICD-10-CM | POA: Insufficient documentation

## 2021-11-25 DIAGNOSIS — Z6841 Body Mass Index (BMI) 40.0 and over, adult: Secondary | ICD-10-CM

## 2021-11-25 DIAGNOSIS — Z9221 Personal history of antineoplastic chemotherapy: Secondary | ICD-10-CM | POA: Diagnosis not present

## 2021-11-25 LAB — CBC WITH DIFFERENTIAL/PLATELET
Abs Immature Granulocytes: 0.08 10*3/uL — ABNORMAL HIGH (ref 0.00–0.07)
Basophils Absolute: 0.1 10*3/uL (ref 0.0–0.1)
Basophils Relative: 1 %
Eosinophils Absolute: 0.5 10*3/uL (ref 0.0–0.5)
Eosinophils Relative: 5 %
HCT: 37.7 % (ref 36.0–46.0)
Hemoglobin: 12.3 g/dL (ref 12.0–15.0)
Immature Granulocytes: 1 %
Lymphocytes Relative: 18 %
Lymphs Abs: 1.8 10*3/uL (ref 0.7–4.0)
MCH: 28.4 pg (ref 26.0–34.0)
MCHC: 32.6 g/dL (ref 30.0–36.0)
MCV: 87.1 fL (ref 80.0–100.0)
Monocytes Absolute: 0.8 10*3/uL (ref 0.1–1.0)
Monocytes Relative: 8 %
Neutro Abs: 6.6 10*3/uL (ref 1.7–7.7)
Neutrophils Relative %: 67 %
Platelets: 332 10*3/uL (ref 150–400)
RBC: 4.33 MIL/uL (ref 3.87–5.11)
RDW: 13.6 % (ref 11.5–15.5)
WBC: 9.8 10*3/uL (ref 4.0–10.5)
nRBC: 0 % (ref 0.0–0.2)

## 2021-11-25 LAB — COMPREHENSIVE METABOLIC PANEL
ALT: 13 U/L (ref 0–44)
AST: 20 U/L (ref 15–41)
Albumin: 3.8 g/dL (ref 3.5–5.0)
Alkaline Phosphatase: 81 U/L (ref 38–126)
Anion gap: 11 (ref 5–15)
BUN: 22 mg/dL (ref 8–23)
CO2: 28 mmol/L (ref 22–32)
Calcium: 9.9 mg/dL (ref 8.9–10.3)
Chloride: 94 mmol/L — ABNORMAL LOW (ref 98–111)
Creatinine, Ser: 0.78 mg/dL (ref 0.44–1.00)
GFR, Estimated: >60 mL/min (ref >60)
Glucose, Bld: 181 mg/dL — ABNORMAL HIGH (ref 70–99)
Potassium: 4.1 mmol/L (ref 3.5–5.1)
Sodium: 133 mmol/L — ABNORMAL LOW (ref 135–145)
Total Bilirubin: 0.1 mg/dL — ABNORMAL LOW (ref 0.3–1.2)
Total Protein: 7.6 g/dL (ref 6.5–8.1)

## 2021-11-25 MED ORDER — ZOLEDRONIC ACID 4 MG/100ML IV SOLN
4.0000 mg | Freq: Once | INTRAVENOUS | Status: AC
  Administered 2021-11-25: 4 mg via INTRAVENOUS
  Filled 2021-11-25: qty 100

## 2021-11-25 MED ORDER — SODIUM CHLORIDE 0.9 % IV SOLN
Freq: Once | INTRAVENOUS | Status: AC
  Filled 2021-11-25: qty 250

## 2021-11-25 NOTE — Progress Notes (Signed)
[facility] Follow Up Visit.  Patient Care Team: Tracie Harrier, MD as PCP - General (Internal Medicine) Rico Junker, RN as Oncology Nurse Navigator Earlie Server, MD as Consulting Physician (Oncology) Leonie Green, MD as Referring Physician (Surgery) Noreene Filbert, MD as Referring Physician (Radiation Oncology)  Reason for visit Follow up for management of Stage IIA left breast cancer.   Pertinent Oncology History Dominique Guzman 71 y.o. female presents for follow up of breast cancer. . Patient had a history a left breast mass biopsy followed by breast excisonal biopsy in 2015 with benign pathology.  She also has a history of Uterine cancer which was treated with RT with Dr.Chrystal.  She underwent routine screening mammogram on 04/17/2017 which recommended a possible mass on the left breast. Left diagnostic mammogram showed a highly suspicious left breast mass at 4 o'clock, 7 cm from the nipple and 1 o'clock,,  About 3 cm from the nipple. Enlarged left axillary lymph node suspicious for metastatic disease.  1 US breast confirmed a left breast mass of 2.1cm, 4 o'clock, and 1.1 cm mass at 1 o'clock.The masses at 1 and 4 o'clock are separated by approximately 4 cm sonographically 2 Biopsy of the two mass and axillary lymph node revealed invasive carcinoma, grade 2. Both are ER,PR positive. HER2 equivocal, FISH pending. 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  3 05/14/2017 MRI breast showed Two sites of biopsy proven malignancy seen in the left breast, which span up to 11.3 cm. There are intervening areas of non mass enhancement and a small suspicious enhancing mass. 4 Case was discussed on breast tumor board on 05/21/2017 and the panel agree with neoadjuvant ddAC to T followed by surgery. Clinically cT3N1M0, Stage IIA  # Genetic testing Testing did not reveal a pathogenic mutation in any of the genes analyzed.  A copy of the genetic test report will  be scanned into Epic under the Media tab.The genes analyzed were the 83 genes on [facility]'s Multi-Cancer panel (ALK, APC, ATM, AXIN2, BAP1, BARD1, BLM, BMPR1A, BRCA1, BRCA2, BRIP1, CASR, CDC73, CDH1, CDK4, CDKN1B, CDKN1C, CDKN2A, CEBPA, CHEK2, CTNNA1, DICER1, DIS3L2, EGFR, EPCAM, FH, FLCN, GATA2, GPC3, GREM1, HOXB13, HRAS, KIT, MAX, MEN1, MET, MITF, MLH1, MSH2, MSH3, MSH6, MUTYH, NBN, NF1, NF2, NTHL1, PALB2, PDGFRA, PHOX2B, PMS2, POLD1, POLE, POT1, PRKAR1A, PTCH1, PTEN, RAD50, RAD51C, RAD51D, RB1, RECQL4, RET, RUNX1, SDHA, SDHAF2, SDHB, SDHC, SDHD, SMAD4, SMARCA4, SMARCB1, SMARCE1, STK11, SUFU, TERC, TERT, TMEM127, TP53, TSC1, TSC2, VHL, WRN, WT1).   #Cancer treatment  Neoadjuvant chemotherapy 05/23/2017- 10/03/2017 patient completed s/p 4 cycles ddAC and weekly taxol x 12, on 10/26/2017 patient underwent that surgery.  Left axillary sentinel lymph node was positive, so Dr. Tamala Julian proceeded with left mastectomy and left axillary lymph node dissection. Pathology showed ypT2(m)  pN1a, ER/PR positive, HER2 FISH negative, grade 2, negative margin, invasive mammary carcinoma.  S/p adjuvant RT.  Declined NATAL[facility] Trial Clinical trial for pabociclib and letrozole.   # May 2019 Started on Letrozole  # Mediport removed 09/18/2018  History of uterine cancer.  INTERVAL HISTORY Patient presents for follow up for history of breast cancer ypT2 yp N1, s/p neoadjuvant chemotherapy and surgery ad adjuvant RT.  Patient presents for 64-monthfollow-up. She feels well. No new breast concersn.  She takes letrozole, tolerates well.  + Hair loss.   Review of Systems  Constitutional:  Negative for appetite change, chills, fatigue and fever.  HENT:   Negative for hearing loss and voice change.  Eyes:  Negative for eye problems.  Respiratory:  Negative for chest tightness and cough.   Cardiovascular:  Negative for chest pain.  Gastrointestinal:  Negative for abdominal distention, abdominal pain and blood in stool.   Endocrine: Positive for hot flashes.  Genitourinary:  Negative for difficulty urinating and frequency.   Musculoskeletal:  Negative for arthralgias.  Skin:  Negative for itching and rash.  Neurological:  Negative for extremity weakness.  Hematological:  Negative for adenopathy.  Psychiatric/Behavioral:  Negative for confusion.    MEDICAL HISTORY: Past Medical History:  Diagnosis Date   Anemia    Arthritis    HIP-LEFT   Benign neoplasm of colon    Breast cancer ([place]) 2018   left breast   Cancer ([place])    Diabetes mellitus without complication ([place])    Genetic testing 01/10/2018   Multi-Cancer panel (83 genes) @ [facility] - No pathogenic mutations detected   H/O osteopenia    History of hiatal hernia    Hypertension    Personal history of chemotherapy    Personal history of radiation therapy    Sleep apnea    USE CPAP   Trigger finger, acquired    Uterine cancer ([place])    Uterine cancer ([place])     SURGICAL HISTORY: Past Surgical History:  Procedure Laterality Date   ABDOMINAL HYSTERECTOMY     BREAST BIOPSY Left 04/2014   negative   BREAST EXCISIONAL BIOPSY Left 2015   CESAREAN SECTION     X2   COLONOSCOPY WITH PROPOFOL N/A 04/15/2015   Procedure: COLONOSCOPY WITH PROPOFOL;  Surgeon: Hulen Luster, MD;  Location: [facility] ENDOSCOPY;  Service: Gastroenterology;  Laterality: N/A;   COLONOSCOPY WITH PROPOFOL N/A 08/23/2020   Procedure: COLONOSCOPY WITH PROPOFOL;  Surgeon: Toledo, Benay Pike, MD;  Location: [facility] ENDOSCOPY;  Service: Gastroenterology;  Laterality: N/A;   HERNIA REPAIR     MASTECTOMY Left 10/2017   MASTECTOMY MODIFIED RADICAL Left 10/26/2017   Procedure: MASTECTOMY MODIFIED RADICAL;  Surgeon: Leonie Green, MD;  Location: [facility] ORS;  Service: General;  Laterality: Left;   MASTECTOMY W/ SENTINEL NODE BIOPSY Left 10/26/2017   Procedure: MASTECTOMY WITH SENTINEL LYMPH NODE BIOPSY;  Surgeon: Leonie Green, MD;  Location: [facility] ORS;  Service: General;  Laterality:  Left;   PORTACATH PLACEMENT Right 05/22/2017   Procedure: INSERTION PORT-A-CATH;  Surgeon: Leonie Green, MD;  Location: [facility] ORS;  Service: General;  Laterality: Right;    SOCIAL HISTORY: Social History   Socioeconomic History   Marital status: Married    Spouse name: Not on file   Number of children: Not on file   Years of education: Not on file   Highest education level: Not on file  Occupational History   Not on file  Tobacco Use   Smoking status: Never   Smokeless tobacco: Never  Vaping Use   Vaping Use: Never used  Substance and Sexual Activity   Alcohol use: No   Drug use: No   Sexual activity: Not on file  Other Topics Concern   Not on file  Social History Narrative   Not on file   Social Determinants of Health   Financial Resource Strain: Not on file  Food Insecurity: Not on file  Transportation Needs: Not on file  Physical Activity: Not on file  Stress: Not on file  Social Connections: Not on file  Intimate Partner Violence: Not on file    FAMILY HISTORY Family History  Problem Relation Age of Onset  Diabetes Mother    Diabetes Father    Melanoma Father 43       on finger   Diabetes Sister    Pancreatic cancer Paternal Grandfather        deceased 21s   Breast cancer Other 51       paternal grandmother's mother    ALLERGIES:  is allergic to crestor [rosuvastatin].  MEDICATIONS:  Current Outpatient Medications  Medication Sig Dispense Refill   Ascorbic Acid (VITAMIN C) 100 MG tablet Take 100 mg by mouth daily.     Biotin 10 MG TABS Take by mouth.     Calcium Carb-Cholecalciferol (CALCIUM 600-D PO) Take 1 tablet by mouth daily.     Cinnamon 500 MG capsule Take 1,000 mg by mouth daily.      [employer] B6 100 MG tablet TAKE 1 TABLET BY MOUTH EVERY DAY 100 tablet 3   cyanocobalamin 1000 MCG tablet Take 1,000 mcg by mouth daily.     diphenhydrAMINE (BENADRYL) 25 MG tablet Take 25 mg by mouth every 6 (six) hours as needed for allergies.       ferrous sulfate 325 (65 FE) MG tablet Take 325 mg by mouth daily with breakfast. OTC     GLUCOSAMINE-CHONDROITIN PO Take 1 tablet by mouth daily.     letrozole (FEMARA) 2.5 MG tablet TAKE 1 TABLET BY MOUTH EVERY DAY 90 tablet 1   lisinopril-hydrochlorothiazide (PRINZIDE,ZESTORETIC) 20-12.5 MG per tablet Take 1 tablet by mouth daily.     loperamide (IMODIUM) 2 MG capsule Take 1 capsule (2 mg total) by mouth See admin instructions. With onset of loose stool, take 26m followed by 275mevery 2 hours until 12 hours have passed without loose bowel movement. Maximum: 16 mg/day 120 capsule 1   meloxicam (MOBIC) 15 MG tablet Take 15 mg by mouth daily as needed.     metFORMIN (GLUCOPHAGE) 500 MG tablet Take 1,000 mg by mouth 2 (two) times daily.      Multiple Vitamin (MULTIVITAMIN) tablet Take 1 tablet by mouth daily.     pioglitazone (ACTOS) 30 MG tablet Take 30 mg by mouth daily.     trospium (SANCTURA) 20 MG tablet Take 20 mg by mouth 2 (two) times daily.     Semaglutide,0.25 or 0.5MG/DOS, 2 MG/1.5ML SOPN Inject into the skin. (Patient not taking: Reported on 09/26/2021)     No current facility-administered medications for this visit.    PHYSICAL EXAMINATION:  ECOG PERFORMANCE STATUS: 1 - Symptomatic but completely ambulatory  Vitals:   11/25/21 1017  BP: (!) 173/95  Pulse: (!) 102  Temp: 99 F (37.2 C)    Filed Weights   11/25/21 1017  Weight: 235 lb (106.6 kg)     Physical Exam Constitutional:      General: She is not in acute distress.    Appearance: She is not diaphoretic.     Comments: Obese,   HENT:     Head: Normocephalic and atraumatic.     Nose: Nose normal.     Mouth/Throat:     Pharynx: No oropharyngeal exudate.  Eyes:     General: No scleral icterus.    Pupils: Pupils are equal, round, and reactive to light.  Cardiovascular:     Rate and Rhythm: Normal rate and regular rhythm.     Heart sounds: No murmur heard. Pulmonary:     Effort: Pulmonary effort is normal.  No respiratory distress.     Breath sounds: No rales.  Chest:  Chest wall: No tenderness.  Abdominal:     General: There is no distension.     Palpations: Abdomen is soft.     Tenderness: There is no abdominal tenderness.  Musculoskeletal:        General: Normal range of motion.     Cervical back: Normal range of motion and neck supple.  Skin:    General: Skin is warm and dry.     Findings: No erythema.  Neurological:     Mental Status: She is alert and oriented to person, place, and time.     Cranial Nerves: No cranial nerve deficit.     Motor: No abnormal muscle tone.     Coordination: Coordination normal.  Psychiatric:        Mood and Affect: Affect normal.  History of left mastectomy.  No palpable chest wall mass. Radiation-induced telangiectasias.    LABORATORY DATA: CBC Latest Ref Rng & Units 11/25/2021 08/24/2021 05/20/2021  WBC 4.0 - 10.5 K/uL 9.8 9.8 8.7  Hemoglobin 12.0 - 15.0 g/dL 12.3 12.2 12.4  Hematocrit 36.0 - 46.0 % 37.7 36.4 36.9  Platelets 150 - 400 K/uL 332 316 310   CMP Latest Ref Rng & Units 11/25/2021 08/24/2021 05/20/2021  Glucose 70 - 99 mg/dL 181(H) 138(H) 187(H)  BUN 8 - 23 mg/dL '22 16 18  ' Creatinine 0.44 - 1.00 mg/dL 0.78 0.91 0.85  Sodium 135 - 145 mmol/L 133(L) 125(L) 135  Potassium 3.5 - 5.1 mmol/L 4.1 3.7 3.9  Chloride 98 - 111 mmol/L 94(L) 89(L) 98  CO2 22 - 32 mmol/L '28 24 27  ' Calcium 8.9 - 10.3 mg/dL 9.9 9.0 9.6  Total Protein 6.5 - 8.1 g/dL 7.6 7.5 7.3  Total Bilirubin 0.3 - 1.2 mg/dL 0.1(L) 0.3 0.3  Alkaline Phos 38 - 126 U/L 81 65 69  AST 15 - 41 U/L '20 18 20  ' ALT 0 - 44 U/L '13 12 11           ' RADIOGRAPHIC STUDIES: I have personally reviewed the radiological images as listed and agreed with the findings in the report. No results found. .   ASSESSMENT/PLAN 71 y.o. female presents with cT3cN1cMo left breast multifocal breast invasive mammary carcinoma, ER+, PR+ HER2 negative by FISH, s/p neoadjuvant chemotherapy with 4 cycles of  ddAC,and 12 weekly taxol.  S.p post left modified radical mastectomy with ALND, s/p radiation.   1. Malignant neoplasm of upper-outer quadrant of left breast in female, estrogen receptor positive ([place])   2. Aromatase inhibitor use   3. Osteopenia, unspecified location   4. Morbid obesity with BMI of 50.0-59.9, adult ([place])    # ypT2N1a M0 disease, ER+PR+, HER2-. Residual disease after neoadjuvant chemotherapy s/p RT/  Clinically she is doing well.  No evidence of disease recurrence. Labs are reviewed and discussed with patient. Continue letrozole 2.15m daily.  Obtain right screening mammogram in Aug 2023  #Osteopenia, on Zometa every 6 months. Proceed with zometa today. Continue calcium and vitamin D supplementation. Repeat bone density  # Radiation dermatitis with telangiectasias.  Monitor clinically. Follow up with Radonc  # obesity, discussed about life style modification.   Follow up in 6 months. Lab md   ZEarlie Server MD, PhD Hematology Oncology  11/25/2021

## 2021-12-20 ENCOUNTER — Other Ambulatory Visit: Payer: Self-pay | Admitting: Oncology

## 2022-03-29 ENCOUNTER — Encounter: Payer: Self-pay | Admitting: Oncology

## 2022-04-18 ENCOUNTER — Ambulatory Visit
Admission: RE | Admit: 2022-04-18 | Discharge: 2022-04-18 | Disposition: A | Discharge status: Home / Self Care | Payer: Medicare HMO | Source: Ambulatory Visit | Attending: Oncology | Admitting: Oncology

## 2022-04-18 DIAGNOSIS — E119 Type 2 diabetes mellitus without complications: Secondary | ICD-10-CM | POA: Diagnosis not present

## 2022-04-18 DIAGNOSIS — M8589 Other specified disorders of bone density and structure, multiple sites: Secondary | ICD-10-CM | POA: Insufficient documentation

## 2022-04-18 DIAGNOSIS — Z79811 Long term (current) use of aromatase inhibitors: Secondary | ICD-10-CM | POA: Insufficient documentation

## 2022-04-18 DIAGNOSIS — Z13820 Encounter for screening for osteoporosis: Secondary | ICD-10-CM | POA: Diagnosis not present

## 2022-04-18 DIAGNOSIS — Z8542 Personal history of malignant neoplasm of other parts of uterus: Secondary | ICD-10-CM | POA: Insufficient documentation

## 2022-04-18 DIAGNOSIS — Z780 Asymptomatic menopausal state: Secondary | ICD-10-CM | POA: Insufficient documentation

## 2022-04-18 DIAGNOSIS — Z853 Personal history of malignant neoplasm of breast: Secondary | ICD-10-CM | POA: Insufficient documentation

## 2022-05-12 ENCOUNTER — Ambulatory Visit
Admission: RE | Admit: 2022-05-12 | Discharge: 2022-05-12 | Disposition: A | Discharge status: Home / Self Care | Payer: Medicare HMO | Source: Ambulatory Visit | Attending: Oncology | Admitting: Oncology

## 2022-05-12 DIAGNOSIS — Z79811 Long term (current) use of aromatase inhibitors: Secondary | ICD-10-CM

## 2022-05-12 DIAGNOSIS — Z1231 Encounter for screening mammogram for malignant neoplasm of breast: Secondary | ICD-10-CM | POA: Diagnosis not present

## 2022-05-23 ENCOUNTER — Inpatient Hospital Stay: Payer: Medicare HMO | Attending: Oncology

## 2022-05-23 DIAGNOSIS — Z9012 Acquired absence of left breast and nipple: Secondary | ICD-10-CM | POA: Diagnosis not present

## 2022-05-23 DIAGNOSIS — Z79811 Long term (current) use of aromatase inhibitors: Secondary | ICD-10-CM | POA: Insufficient documentation

## 2022-05-23 DIAGNOSIS — Z79899 Other long term (current) drug therapy: Secondary | ICD-10-CM | POA: Insufficient documentation

## 2022-05-23 DIAGNOSIS — Z7984 Long term (current) use of oral hypoglycemic drugs: Secondary | ICD-10-CM | POA: Insufficient documentation

## 2022-05-23 DIAGNOSIS — M8580 Other specified disorders of bone density and structure, unspecified site: Secondary | ICD-10-CM | POA: Diagnosis not present

## 2022-05-23 DIAGNOSIS — C50412 Malignant neoplasm of upper-outer quadrant of left female breast: Secondary | ICD-10-CM | POA: Diagnosis present

## 2022-05-23 DIAGNOSIS — Z923 Personal history of irradiation: Secondary | ICD-10-CM | POA: Diagnosis not present

## 2022-05-23 DIAGNOSIS — Z170 Estrogen receptor positive status [ER+]: Secondary | ICD-10-CM | POA: Diagnosis not present

## 2022-05-23 LAB — CBC WITH DIFFERENTIAL/PLATELET
Abs Immature Granulocytes: 0.07 10*3/uL (ref 0.00–0.07)
Basophils Absolute: 0.1 10*3/uL (ref 0.0–0.1)
Basophils Relative: 1 %
Eosinophils Absolute: 0.5 10*3/uL (ref 0.0–0.5)
Eosinophils Relative: 6 %
HCT: 37.6 % (ref 36.0–46.0)
Hemoglobin: 12.6 g/dL (ref 12.0–15.0)
Immature Granulocytes: 1 %
Lymphocytes Relative: 17 %
Lymphs Abs: 1.4 10*3/uL (ref 0.7–4.0)
MCH: 28.8 pg (ref 26.0–34.0)
MCHC: 33.5 g/dL (ref 30.0–36.0)
MCV: 85.8 fL (ref 80.0–100.0)
Monocytes Absolute: 0.7 10*3/uL (ref 0.1–1.0)
Monocytes Relative: 9 %
Neutro Abs: 5.6 10*3/uL (ref 1.7–7.7)
Neutrophils Relative %: 66 %
Platelets: 322 10*3/uL (ref 150–400)
RBC: 4.38 MIL/uL (ref 3.87–5.11)
RDW: 13.4 % (ref 11.5–15.5)
WBC: 8.3 10*3/uL (ref 4.0–10.5)
nRBC: 0 % (ref 0.0–0.2)

## 2022-05-23 LAB — COMPREHENSIVE METABOLIC PANEL
ALT: 15 U/L (ref 0–44)
AST: 19 U/L (ref 15–41)
Albumin: 3.9 g/dL (ref 3.5–5.0)
Alkaline Phosphatase: 67 U/L (ref 38–126)
Anion gap: 9 (ref 5–15)
BUN: 24 mg/dL — ABNORMAL HIGH (ref 8–23)
CO2: 25 mmol/L (ref 22–32)
Calcium: 10.3 mg/dL (ref 8.9–10.3)
Chloride: 102 mmol/L (ref 98–111)
Creatinine, Ser: 0.74 mg/dL (ref 0.44–1.00)
GFR, Estimated: >60 mL/min (ref >60)
Glucose, Bld: 177 mg/dL — ABNORMAL HIGH (ref 70–99)
Potassium: 4 mmol/L (ref 3.5–5.1)
Sodium: 136 mmol/L (ref 135–145)
Total Bilirubin: 0.5 mg/dL (ref 0.3–1.2)
Total Protein: 7.6 g/dL (ref 6.5–8.1)

## 2022-05-24 LAB — CANCER ANTIGEN 15-3: CA 15-3: 24.2 U/mL (ref 0.0–25.0)

## 2022-05-24 LAB — CANCER ANTIGEN 27.29: CA 27.29: 15.7 U/mL (ref 0.0–38.6)

## 2022-05-25 ENCOUNTER — Ambulatory Visit: Payer: Medicare HMO | Admitting: Oncology

## 2022-06-01 ENCOUNTER — Encounter: Payer: Self-pay | Admitting: Oncology

## 2022-06-01 ENCOUNTER — Inpatient Hospital Stay: Payer: Medicare HMO | Admitting: Oncology

## 2022-06-01 VITALS — BP 147/72 | HR 92 | Temp 98.6°F | Resp 18 | Wt 238.4 lb

## 2022-06-01 DIAGNOSIS — M8589 Other specified disorders of bone density and structure, multiple sites: Secondary | ICD-10-CM

## 2022-06-01 DIAGNOSIS — C50512 Malignant neoplasm of lower-outer quadrant of left female breast: Secondary | ICD-10-CM

## 2022-06-01 DIAGNOSIS — Z170 Estrogen receptor positive status [ER+]: Secondary | ICD-10-CM | POA: Diagnosis not present

## 2022-06-01 DIAGNOSIS — C50412 Malignant neoplasm of upper-outer quadrant of left female breast: Secondary | ICD-10-CM | POA: Diagnosis not present

## 2022-06-01 MED ORDER — LETROZOLE 2.5 MG PO TABS: 2.5000 mg | ORAL_TABLET | Freq: Every day | ORAL | 1 refills | Status: DC

## 2022-06-02 ENCOUNTER — Inpatient Hospital Stay: Payer: Medicare HMO

## 2022-06-02 ENCOUNTER — Encounter: Payer: Self-pay | Admitting: Oncology

## 2022-06-02 VITALS — BP 155/67 | HR 80 | Temp 98.3°F | Resp 18

## 2022-06-02 DIAGNOSIS — C50412 Malignant neoplasm of upper-outer quadrant of left female breast: Secondary | ICD-10-CM | POA: Diagnosis not present

## 2022-06-02 DIAGNOSIS — E860 Dehydration: Secondary | ICD-10-CM

## 2022-06-02 MED ORDER — SODIUM CHLORIDE 0.9 % IV SOLN
Freq: Once | INTRAVENOUS | Status: AC
  Filled 2022-06-02: qty 250

## 2022-06-02 MED ORDER — ZOLEDRONIC ACID 4 MG/100ML IV SOLN
4.0000 mg | Freq: Once | INTRAVENOUS | Status: AC
  Administered 2022-06-02: 4 mg via INTRAVENOUS
  Filled 2022-06-02: qty 100

## 2022-06-02 NOTE — Assessment & Plan Note (Signed)
Proceed with zometa today. Continue calcium and vitamin D supplementation. Repeat bone density 04/18/22 shows slight progression of osteopenia 

## 2022-06-02 NOTE — Assessment & Plan Note (Addendum)
#  cT3cN1cMo 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 S/p left modified radical mastectomy with ALND, s/p radiation.  ypT2N1a M0, declined adjuvant clinical trial Pabociclib + letrozole, currently on adjuvant endocrinology treatment with AI  Clinically she is doing well.  No evidence of disease recurrence. Labs reviewed and discussed with patient. Continue letrozole 2.5mg daily.  Continue annual right breast mammogram  #Skin telangiectasias due to previous RT.  Monitor clinically.  

## 2022-06-02 NOTE — Progress Notes (Signed)
[facility] Follow Up Visit.  Patient Care Team: Tracie Harrier, MD as PCP - General (Internal Medicine) Rico Junker, RN as Oncology Nurse Navigator Earlie Server, MD as Consulting Physician (Oncology) Leonie Green, MD (Inactive) as Referring Physician (Surgery) Noreene Filbert, MD as Referring Physician (Radiation Oncology)   ASSESSMENT & PLAN:   Cancer Staging  Breast cancer [facility]) Staging form: Breast, AJCC 8th Edition - Pathologic stage from 11/09/2017: No Stage Recommended (ypT2, pN1a, cM0, G2, ER+, PR+, HER2-) - Signed by Earlie Server, MD on 11/09/2017  Malignant neoplasm of lower-outer quadrant of left breast of female, estrogen receptor positive ([place]) Staging form: Breast, AJCC 8th Edition - Clinical stage from 04/25/2017: Stage IIA (cT3(m), cN1(f), cM0, G2, ER+, PR+, HER2: Equivocal) - Signed by Earlie Server, MD on 05/23/2017  Malignant neoplasm of upper-outer quadrant of left breast in female, estrogen receptor positive ([place]) Staging form: Breast, AJCC 8th Edition - Clinical: No stage assigned - Unsigned   Malignant neoplasm of lower-outer quadrant of left breast of female, estrogen receptor positive ([place]) # cT3cN1cMo 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 S/p left modified radical mastectomy with ALND, s/p radiation.  ypT2N1a M0   Clinically she is doing well.  No evidence of disease recurrence. Labs reviewed and discussed with patient. Continue letrozole 2.80m daily.  Continue annual right breast mammogram  #Skin telangiectasias due to previous RT.  Monitor clinically.   Osteopenia Proceed with zometa today. Continue calcium and vitamin D supplementation. Repeat bone density 04/18/22 shows slight progression of osteopenia  Orders Placed This Encounter  Procedures   CBC with Differential/Platelet    Standing Status:   Future     Standing Expiration Date:   06/02/2023   Comprehensive metabolic panel    Standing Status:   Future    Standing Expiration Date:   06/01/2023   Cancer antigen 15-3    Standing Status:   Future    Standing Expiration Date:   06/02/2023   Cancer antigen 27.29    Standing Status:   Future    Standing Expiration Date:   06/02/2023   Follow up 69monthLab prior to MD + Zometa All questions were answered. The patient knows to call the clinic with any problems, questions or concerns.  ZhEarlie ServerMD, PhD [facility] 06/01/2022      Reason for visit Follow up for management of Stage IIA left breast cancer.   Pertinent Oncology History SaSARIYAH CORCINO89.o. female presents for follow up of breast cancer. . Patient had a history a left breast mass biopsy followed by breast excisonal biopsy in 2015 with benign pathology.  She also has a history of Uterine cancer which was treated with RT with Dr.Chrystal.  She underwent routine screening mammogram on 04/17/2017 which recommended a possible mass on the left breast. Left diagnostic mammogram showed a highly suspicious left breast mass at 4 o'clock, 7 cm from the nipple and 1 o'clock,,  About 3 cm from the nipple. Enlarged left axillary lymph node suspicious for metastatic disease.  1 [place] confirmed a left breast mass of 2.1cm, 4 o'clock, and 1.1 cm mass at 1 o'clock.The masses at 1 and 4 o'clock are separated by approximately 4 cm sonographically 2 Biopsy of the two mass and axillary lymph node revealed invasive carcinoma, grade 2. Both are ER,PR positive. HER2 equivocal, FISH pending. 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  3 05/14/2017 MRI breast showed Two sites of biopsy proven malignancy seen in the left breast, which span up to 11.3 cm. There are intervening areas of non mass enhancement and a small suspicious enhancing mass. 4 Case was discussed on breast tumor board on 05/21/2017 and the panel agree  with neoadjuvant ddAC to T followed by surgery. Clinically cT3N1M0, Stage IIA  # Genetic testing Testing did not reveal a pathogenic mutation in any of the genes analyzed.  A copy of the genetic test report will be scanned into Epic under the Media tab.The genes analyzed were the 83 genes on [facility]'s Multi-Cancer panel (ALK, APC, ATM, AXIN2, BAP1, BARD1, BLM, BMPR1A, BRCA1, BRCA2, BRIP1, CASR, CDC73, CDH1, CDK4, CDKN1B, CDKN1C, CDKN2A, CEBPA, CHEK2, CTNNA1, DICER1, DIS3L2, EGFR, EPCAM, FH, FLCN, GATA2, GPC3, GREM1, HOXB13, HRAS, KIT, MAX, MEN1, MET, MITF, MLH1, MSH2, MSH3, MSH6, MUTYH, NBN, NF1, NF2, NTHL1, PALB2, PDGFRA, PHOX2B, PMS2, POLD1, POLE, POT1, PRKAR1A, PTCH1, PTEN, RAD50, RAD51C, RAD51D, RB1, RECQL4, RET, RUNX1, SDHA, SDHAF2, SDHB, SDHC, SDHD, SMAD4, SMARCA4, SMARCB1, SMARCE1, STK11, SUFU, TERC, TERT, TMEM127, TP53, TSC1, TSC2, VHL, WRN, WT1).   #Cancer treatment  Neoadjuvant chemotherapy 05/23/2017- 10/03/2017 patient completed s/p 4 cycles ddAC and weekly taxol x 12, on 10/26/2017 patient underwent that surgery.  Left axillary sentinel lymph node was positive, so Dr. Tamala Julian proceeded with left mastectomy and left axillary lymph node dissection. Pathology showed ypT2(m)  pN1a, ER/PR positive, HER2 FISH negative, grade 2, negative margin, invasive mammary carcinoma.  S/p adjuvant RT.  Declined NATALEE Trial Clinical trial for pabociclib and letrozole.   # May 2019 Started on Letrozole  # Mediport removed 09/18/2018  History of uterine cancer.  INTERVAL HISTORY Patient presents for follow up for history of breast cancer ypT2 yp N1, s/p neoadjuvant chemotherapy and surgery ad adjuvant RT.  Patient presents for 69-monthfollow-up. She feels well.  Patient has no new breast concerns. Tolerates letrozole well.   Review of Systems  Constitutional:  Negative for appetite change, chills, fatigue and fever.  HENT:   Negative for hearing loss and voice change.   Eyes:  Negative for eye  problems.  Respiratory:  Negative for chest tightness and cough.   Cardiovascular:  Negative for chest pain.  Gastrointestinal:  Negative for abdominal distention, abdominal pain and blood in stool.  Endocrine: Positive for hot flashes.  Genitourinary:  Negative for difficulty urinating and frequency.   Musculoskeletal:  Negative for arthralgias.  Skin:  Negative for itching and rash.  Neurological:  Negative for extremity weakness.  Hematological:  Negative for adenopathy.  Psychiatric/Behavioral:  Negative for confusion.     MEDICAL HISTORY: Past Medical History:  Diagnosis Date   Anemia    Arthritis    HIP-LEFT   Benign neoplasm of colon    Breast cancer ([facility] 2018   left breast   Cancer ([facility]    Diabetes mellitus without complication ([facility]    Genetic testing 01/10/2018   Multi-Cancer panel (83 genes) @ [facility] - No pathogenic mutations detected   H/O osteopenia    History of hiatal hernia    Hypertension    Personal history of chemotherapy    Personal history of radiation therapy    Sleep apnea    USE CPAP   Trigger finger, acquired    Uterine cancer ([facility]    Uterine cancer ([facility] [place]     SURGICAL HISTORY: Past Surgical History:  Procedure Laterality Date   ABDOMINAL HYSTERECTOMY  BREAST BIOPSY Left 04/2014   negative   BREAST EXCISIONAL BIOPSY Left 2015   CESAREAN SECTION     X2   COLONOSCOPY WITH PROPOFOL N/A 04/15/2015   Procedure: COLONOSCOPY WITH PROPOFOL;  Surgeon: Hulen Luster, MD;  Location: [facility] ENDOSCOPY;  Service: Gastroenterology;  Laterality: N/A;   COLONOSCOPY WITH PROPOFOL N/A 08/23/2020   Procedure: COLONOSCOPY WITH PROPOFOL;  Surgeon: Toledo, Benay Pike, MD;  Location: [facility] ENDOSCOPY;  Service: Gastroenterology;  Laterality: N/A;   HERNIA REPAIR     MASTECTOMY Left 10/2017   MASTECTOMY MODIFIED RADICAL Left 10/26/2017   Procedure: MASTECTOMY MODIFIED RADICAL;  Surgeon: Leonie Green, MD;  Location: [facility] ORS;  Service: General;  Laterality: Left;    MASTECTOMY W/ SENTINEL NODE BIOPSY Left 10/26/2017   Procedure: MASTECTOMY WITH SENTINEL LYMPH NODE BIOPSY;  Surgeon: Leonie Green, MD;  Location: [facility] ORS;  Service: General;  Laterality: Left;   PORTACATH PLACEMENT Right 05/22/2017   Procedure: INSERTION PORT-A-CATH;  Surgeon: Leonie Green, MD;  Location: [facility] ORS;  Service: General;  Laterality: Right;    SOCIAL HISTORY: Social History   Socioeconomic History   Marital status: Married    Spouse name: Not on file   Number of children: Not on file   Years of education: Not on file   Highest education level: Not on file  Occupational History   Not on file  Tobacco Use   Smoking status: Never   Smokeless tobacco: Never  Vaping Use   Vaping Use: Never used  Substance and Sexual Activity   Alcohol use: No   Drug use: No   Sexual activity: Not on file  Other Topics Concern   Not on file  Social History Narrative   Not on file   Social Determinants of Health   Financial Resource Strain: Not on file  Food Insecurity: Not on file  Transportation Needs: Not on file  Physical Activity: Not on file  Stress: Not on file  Social Connections: Not on file  Intimate Partner Violence: Not on file    FAMILY HISTORY Family History  Problem Relation Age of Onset   Diabetes Mother    Diabetes Father    Melanoma Father 69       on finger   Diabetes Sister    Pancreatic cancer Paternal Grandfather        deceased 81s   Breast cancer Other 55       paternal grandmother's mother    ALLERGIES:  is allergic to crestor [rosuvastatin].  MEDICATIONS:  Current Outpatient Medications  Medication Sig Dispense Refill   Ascorbic Acid (VITAMIN C) 100 MG tablet Take 100 mg by mouth daily.     Biotin 10 MG TABS Take by mouth.     Calcium Carb-Cholecalciferol (CALCIUM 600-D PO) Take 1 tablet by mouth daily.     Cinnamon 500 MG capsule Take 1,000 mg by mouth daily.      [employer] B6 100 MG tablet TAKE 1 TABLET BY MOUTH EVERY DAY  100 tablet 3   cyanocobalamin 1000 MCG tablet Take 1,000 mcg by mouth daily.     diphenhydrAMINE (BENADRYL) 25 MG tablet Take 25 mg by mouth every 6 (six) hours as needed for allergies.      ferrous sulfate 325 (65 FE) MG tablet Take 325 mg by mouth daily with breakfast. OTC     GLUCOSAMINE-CHONDROITIN PO Take 1 tablet by mouth daily.     lisinopril-hydrochlorothiazide (PRINZIDE,ZESTORETIC) 20-12.5 MG per tablet Take 1 tablet by  mouth daily.     meloxicam (MOBIC) 15 MG tablet Take 15 mg by mouth daily as needed.     metFORMIN (GLUCOPHAGE) 500 MG tablet Take 1,000 mg by mouth 2 (two) times daily.      Multiple Vitamin (MULTIVITAMIN) tablet Take 1 tablet by mouth daily.     pioglitazone (ACTOS) 30 MG tablet Take 30 mg by mouth daily.     letrozole (FEMARA) 2.5 MG tablet Take 1 tablet (2.5 mg total) by mouth daily. 90 tablet 1   loperamide (IMODIUM) 2 MG capsule Take 1 capsule (2 mg total) by mouth See admin instructions. With onset of loose stool, take 15m followed by 226mevery 2 hours until 12 hours have passed without loose bowel movement. Maximum: 16 mg/day (Patient not taking: Reported on 06/01/2022) 120 capsule 1   No current facility-administered medications for this visit.    PHYSICAL EXAMINATION:  ECOG PERFORMANCE STATUS: 1 - Symptomatic but completely ambulatory  Vitals:   06/01/22 1122  BP: (!) 147/72  Pulse: 92  Resp: 18  Temp: 98.6 F (37 C)    Filed Weights   06/01/22 1122  Weight: 238 lb 6.4 oz (108.1 kg)     Physical Exam Constitutional:      General: She is not in acute distress.    Appearance: She is not diaphoretic.     Comments: Obese,   HENT:     Head: Normocephalic and atraumatic.     Nose: Nose normal.     Mouth/Throat:     Pharynx: No oropharyngeal exudate.  Eyes:     General: No scleral icterus.    Pupils: Pupils are equal, round, and reactive to light.  Cardiovascular:     Rate and Rhythm: Normal rate and regular rhythm.     Heart sounds: No  murmur heard. Pulmonary:     Effort: Pulmonary effort is normal. No respiratory distress.     Breath sounds: No rales.  Chest:     Chest wall: No tenderness.  Abdominal:     General: There is no distension.     Palpations: Abdomen is soft.     Tenderness: There is no abdominal tenderness.  Musculoskeletal:        General: Normal range of motion.     Cervical back: Normal range of motion and neck supple.  Skin:    General: Skin is warm and dry.     Findings: No erythema.  Neurological:     Mental Status: She is alert and oriented to person, place, and time.     Cranial Nerves: No cranial nerve deficit.     Motor: No abnormal muscle tone.     Coordination: Coordination normal.  Psychiatric:        Mood and Affect: Affect normal.   History of left mastectomy.  No palpable chest wall mass. Radiation-induced telangiectasias.    LABORATORY DATA:    Latest Ref Rng & Units 05/23/2022    9:11 AM 11/25/2021    9:57 AM 08/24/2021   11:18 AM  CBC  WBC 4.0 - 10.5 K/uL 8.3  9.8  9.8   Hemoglobin 12.0 - 15.0 g/dL 12.6  12.3  12.2   Hematocrit 36.0 - 46.0 % 37.6  37.7  36.4   Platelets 150 - 400 K/uL 322  332  316       Latest Ref Rng & Units 05/23/2022    9:11 AM 11/25/2021    9:57 AM 08/24/2021   11:18 AM  CMP  Glucose 70 - 99 mg/dL 177  181  138   BUN 8 - 23 mg/dL _0 Creatinine 0.44 - 1.00 mg/dL 0.74  0.78  0.91   Sodium 135 - 145 mmol/L 136  133  125   Potassium 3.5 - 5.1 mmol/L 4.0  4.1  3.7   Chloride 98 - 111 mmol/L 102  94  89   CO2 22 - 32 mmol/L _1 Calcium 8.9 - 10.3 mg/dL 10.3  9.9  9.0   Total Protein 6.5 - 8.1 g/dL 7.6  7.6  7.5   Total Bilirubin 0.3 - 1.2 mg/dL 0.5  0.1  0.3   Alkaline Phos 38 - 126 U/L 67  81  65   AST 15 - 41 U/L _2 ALT 0 - 44 U/L _3 RADIOGRAPHIC STUDIES: I have personally reviewed the radiological images as listed and agreed with the findings in the report. MM 3D SCREEN BREAST UNI  RIGHT  Result Date: 05/15/2022 CLINICAL DATA:  Screening. EXAM: DIGITAL SCREENING UNILATERAL RIGHT MAMMOGRAM WITH CAD AND TOMOSYNTHESIS TECHNIQUE: Right screening digital craniocaudal and mediolateral oblique mammograms were obtained. Right screening digital breast tomosynthesis was performed. The images were evaluated with computer-aided detection. COMPARISON:  Previous exam(s). ACR Breast Density Category b: There are scattered areas of fibroglandular density. FINDINGS: The patient has had a left mastectomy. There are no findings suspicious for malignancy. IMPRESSION: No mammographic evidence of malignancy. A result letter of this screening mammogram will be mailed directly to the patient. RECOMMENDATION: Screening mammogram in one year.  (Code:SM-R-57M) BI-RADS CATEGORY  1: Negative. Electronically Signed   By: Dorise Bullion III M.D.   On: 05/15/2022 14:53   DG Bone Density  Result Date: 04/18/2022 EXAM: DUAL X-RAY ABSORPTIOMETRY (DXA) FOR BONE MINERAL DENSITY IMPRESSION: Dear Dr. Tasia Catchings, Your patient Dominique Guzman completed a FRAX assessment on 04/18/2022 using the [place] (analysis version: 14.10) manufactured by [employer]. The following summarizes the results of our evaluation. PATIENT BIOGRAPHICAL: Name: Dominique Guzman, Dominique Guzman Patient ID: [id number] Birth Date: [birth year] Height:    57.0 in. Gender:     [age]    [age]       Weight:    241.9 lbs. Ethnicity:  White                            Exam Date: 04/18/2022 FRAX* RESULTS:  (version: 3.5) 10-year Probability of Fracture1 Major Osteoporotic Fracture2 Hip Fracture 8.0% 1.0% Population: [place] (Caucasian) Risk Factors: None Based on Femur (Right) Neck BMD 1 -The 10-year probability of fracture may be lower than reported if the patient has received treatment. 2 -Major Osteoporotic Fracture: Clinical Spine, Forearm, Hip or Shoulder *FRAX is a Materials engineer of the [employer] for Metabolic Bone  Disease, a [place] (WHO) [facility]. ASSESSMENT: The probability of a major osteoporotic fracture is 8.0% within the next ten years. The probability of a hip fracture is 1.0% within the next ten years. . Your patient Dominique Guzman completed a BMD test on 04/18/2022 using the [place] (software version: 14.10) manufactured by [employer]. The following summarizes the results of our evaluation. Technologist: [facility] PATIENT BIOGRAPHICAL: Name: Dominique Guzman, Dominique Guzman  Patient ID: [id number] Birth Date: Aug 06, 1951 Height: 57.0 in. Gender: Female Exam Date: 04/18/2022 Weight: 241.9 lbs. Indications: Caucasian, Diabetic, History of Breast Cancer, History of Uterine Cancer, Hysterectomy, Oophorectomy Bilateral, Postmenopausal Fractures: Treatments: Calcium, Letrozole, Metformin, Vitamin D DENSITOMETRY RESULTS: Site         Region     Measured Date Measured Age WHO Classification Young Adult T-score BMD         %Change vs. Previous Significant Change (*) AP Spine L1-L3 04/18/2022 69.0 Osteopenia -1.1 1.053 g/cm2 -6.6% Yes AP Spine L1-L3 03/03/2020 66.8 Normal -0.4 1.127 g/cm2 - - DualFemur Neck Right 04/18/2022 69.0 Osteopenia -1.6 0.811 g/cm2 -6.6% Yes DualFemur Neck Right 03/03/2020 66.8 Osteopenia -1.2 0.868 g/cm2 - - DualFemur Total Mean 04/18/2022 69.0 Normal 0.1 1.021 g/cm2 -2.9% Yes DualFemur Total Mean 03/03/2020 66.8 Normal 0.4 1.052 g/cm2 - - Left Forearm Radius 33% 04/18/2022 69.0 Normal 0.1 0.888 g/cm2 - - ASSESSMENT: The BMD measured at Femur Neck Right is 0.811 g/cm2 with a T-score of -1.6. This patient is considered osteopenic according to [facility]) criteria. The scan quality is good. L-4 was excluded due to degenerative changes. Compared with prior study, there has been significant decrease in the spine. Compared with prior study, there has been significant decrease/no significant change in the total hip. World Pharmacologist [facility])  criteria for post-menopausal, Caucasian Women: Normal:                   T-score at or above -1 SD Osteopenia/low bone mass: T-score between -1 and -2.5 SD Osteoporosis:             T-score at or below -2.5 SD RECOMMENDATIONS: 1. All patients should optimize calcium and vitamin D intake. 2. Consider FDA-approved medical therapies in postmenopausal women and men aged 48 years and older, based on the following: a. A hip or vertebral(clinical or morphometric) fracture b. T-score < -2.5 at the femoral neck or spine after appropriate evaluation to exclude secondary causes c. Low bone mass (T-score between -1.0 and -2.5 at the femoral neck or spine) and a 10-year probability of a hip fracture > 3% or a 10-year probability of a major osteoporosis-related fracture > 20% based on the [place]-adapted WHO algorithm 3. Clinician judgment and/or patient preferences may indicate treatment for people with 10-year fracture probabilities above or below these levels FOLLOW-UP: People with diagnosed cases of osteoporosis or at high risk for fracture should have regular bone mineral density tests. For patients eligible for Medicare, routine testing is allowed once every 2 years. The testing frequency can be increased to one year for patients who have rapidly progressing disease, those who are receiving or discontinuing medical therapy to restore bone mass, or have additional risk factors. I have reviewed this report, and agree with the above findings. [facility] Electronically Signed   By: Franki Cabot M.D.   On: 04/18/2022 10:52   .

## 2022-06-02 NOTE — Assessment & Plan Note (Deleted)
#  ypT2N1a M0 disease, ER+PR+, HER2-. Residual disease after neoadjuvant chemotherapy s/p RT/  Clinically she is doing well.  No evidence of disease recurrence. Labs reviewed and discussed with patient. Continue letrozole 2.5mg daily.  Continue annual right breast mammogram 

## 2022-10-05 ENCOUNTER — Other Ambulatory Visit
Admission: RE | Admit: 2022-10-05 | Discharge: 2022-10-05 | Disposition: A | Discharge status: Home / Self Care | Payer: Medicare HMO | Source: Ambulatory Visit | Attending: Family Medicine | Admitting: Family Medicine

## 2022-10-05 DIAGNOSIS — M25561 Pain in right knee: Secondary | ICD-10-CM | POA: Diagnosis present

## 2022-10-05 LAB — D-DIMER, QUANTITATIVE: D-Dimer, Quant: 0.34 ug/mL-FEU (ref 0.00–0.50)

## 2022-11-24 ENCOUNTER — Other Ambulatory Visit: Payer: Self-pay | Admitting: Oncology

## 2022-12-04 ENCOUNTER — Inpatient Hospital Stay: Payer: Medicare HMO | Attending: Oncology

## 2022-12-04 DIAGNOSIS — C50412 Malignant neoplasm of upper-outer quadrant of left female breast: Secondary | ICD-10-CM | POA: Diagnosis present

## 2022-12-04 DIAGNOSIS — Z79811 Long term (current) use of aromatase inhibitors: Secondary | ICD-10-CM | POA: Insufficient documentation

## 2022-12-04 DIAGNOSIS — Z170 Estrogen receptor positive status [ER+]: Secondary | ICD-10-CM | POA: Diagnosis not present

## 2022-12-04 LAB — CBC WITH DIFFERENTIAL/PLATELET
Abs Immature Granulocytes: 0.14 10*3/uL — ABNORMAL HIGH (ref 0.00–0.07)
Basophils Absolute: 0.1 10*3/uL (ref 0.0–0.1)
Basophils Relative: 1 %
Eosinophils Absolute: 0.4 10*3/uL (ref 0.0–0.5)
Eosinophils Relative: 4 %
HCT: 38.1 % (ref 36.0–46.0)
Hemoglobin: 12.7 g/dL (ref 12.0–15.0)
Immature Granulocytes: 2 %
Lymphocytes Relative: 15 %
Lymphs Abs: 1.3 10*3/uL (ref 0.7–4.0)
MCH: 29 pg (ref 26.0–34.0)
MCHC: 33.3 g/dL (ref 30.0–36.0)
MCV: 87 fL (ref 80.0–100.0)
Monocytes Absolute: 0.9 10*3/uL (ref 0.1–1.0)
Monocytes Relative: 10 %
Neutro Abs: 5.9 10*3/uL (ref 1.7–7.7)
Neutrophils Relative %: 68 %
Platelets: 366 10*3/uL (ref 150–400)
RBC: 4.38 MIL/uL (ref 3.87–5.11)
RDW: 14.2 % (ref 11.5–15.5)
WBC: 8.6 10*3/uL (ref 4.0–10.5)
nRBC: 0 % (ref 0.0–0.2)

## 2022-12-04 LAB — COMPREHENSIVE METABOLIC PANEL
ALT: 17 U/L (ref 0–44)
AST: 22 U/L (ref 15–41)
Albumin: 3.8 g/dL (ref 3.5–5.0)
Alkaline Phosphatase: 82 U/L (ref 38–126)
Anion gap: 12 (ref 5–15)
BUN: 26 mg/dL — ABNORMAL HIGH (ref 8–23)
CO2: 25 mmol/L (ref 22–32)
Calcium: 9.7 mg/dL (ref 8.9–10.3)
Chloride: 95 mmol/L — ABNORMAL LOW (ref 98–111)
Creatinine, Ser: 0.84 mg/dL (ref 0.44–1.00)
GFR, Estimated: >60 mL/min (ref >60)
Glucose, Bld: 260 mg/dL — ABNORMAL HIGH (ref 70–99)
Potassium: 3.8 mmol/L (ref 3.5–5.1)
Sodium: 132 mmol/L — ABNORMAL LOW (ref 135–145)
Total Bilirubin: 0.4 mg/dL (ref 0.3–1.2)
Total Protein: 7.4 g/dL (ref 6.5–8.1)

## 2022-12-05 LAB — CANCER ANTIGEN 15-3: CA 15-3: 32.8 U/mL — ABNORMAL HIGH (ref 0.0–25.0)

## 2022-12-05 LAB — CANCER ANTIGEN 27.29: CA 27.29: 27.5 U/mL (ref 0.0–38.6)

## 2022-12-14 ENCOUNTER — Encounter: Payer: Self-pay | Admitting: Oncology

## 2022-12-14 ENCOUNTER — Inpatient Hospital Stay: Payer: Medicare HMO | Attending: Oncology | Admitting: Oncology

## 2022-12-14 ENCOUNTER — Inpatient Hospital Stay: Payer: Medicare HMO

## 2022-12-14 VITALS — BP 127/60 | HR 101 | Temp 97.6°F | Resp 18 | Wt 229.1 lb

## 2022-12-14 DIAGNOSIS — M8589 Other specified disorders of bone density and structure, multiple sites: Secondary | ICD-10-CM

## 2022-12-14 DIAGNOSIS — Z8542 Personal history of malignant neoplasm of other parts of uterus: Secondary | ICD-10-CM | POA: Diagnosis not present

## 2022-12-14 DIAGNOSIS — Z79811 Long term (current) use of aromatase inhibitors: Secondary | ICD-10-CM | POA: Diagnosis not present

## 2022-12-14 DIAGNOSIS — Z170 Estrogen receptor positive status [ER+]: Secondary | ICD-10-CM | POA: Diagnosis not present

## 2022-12-14 DIAGNOSIS — Z923 Personal history of irradiation: Secondary | ICD-10-CM | POA: Insufficient documentation

## 2022-12-14 DIAGNOSIS — E860 Dehydration: Secondary | ICD-10-CM

## 2022-12-14 DIAGNOSIS — C50412 Malignant neoplasm of upper-outer quadrant of left female breast: Secondary | ICD-10-CM | POA: Diagnosis not present

## 2022-12-14 DIAGNOSIS — Z9221 Personal history of antineoplastic chemotherapy: Secondary | ICD-10-CM | POA: Insufficient documentation

## 2022-12-14 DIAGNOSIS — R978 Other abnormal tumor markers: Secondary | ICD-10-CM | POA: Diagnosis not present

## 2022-12-14 DIAGNOSIS — Z9012 Acquired absence of left breast and nipple: Secondary | ICD-10-CM | POA: Insufficient documentation

## 2022-12-14 DIAGNOSIS — C50512 Malignant neoplasm of lower-outer quadrant of left female breast: Secondary | ICD-10-CM | POA: Diagnosis not present

## 2022-12-14 DIAGNOSIS — M8580 Other specified disorders of bone density and structure, unspecified site: Secondary | ICD-10-CM | POA: Insufficient documentation

## 2022-12-14 MED ORDER — SODIUM CHLORIDE 0.9 % IV SOLN
INTRAVENOUS | Status: DC
  Filled 2022-12-14: qty 250

## 2022-12-14 MED ORDER — ZOLEDRONIC ACID 4 MG/100ML IV SOLN
4.0000 mg | Freq: Once | INTRAVENOUS | Status: AC
  Administered 2022-12-14: 4 mg via INTRAVENOUS
  Filled 2022-12-14: qty 100

## 2022-12-14 NOTE — Patient Instructions (Signed)

## 2022-12-14 NOTE — Assessment & Plan Note (Addendum)
#   cT3cN1cMo 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 S/p left modified radical mastectomy with ALND, s/p radiation.  ypT2N1a M0, declined adjuvant clinical trial Pabociclib + letrozole, currently on adjuvant endocrinology treatment with AI  Clinically she is doing well.  No evidence of disease recurrence. Labs reviewed and discussed with patient., tumor marker CA15-3 is elevated. I will obtain CT scan for evaluation. .  Continue letrozole 2.'5mg'$  daily.  Continue annual right breast mammogram - August 2024  #Skin telangiectasias due to previous RT.  Monitor clinically.

## 2022-12-14 NOTE — Assessment & Plan Note (Signed)
Proceed with zometa today. Continue calcium and vitamin D supplementation. Repeat bone density 04/18/22 shows slight progression of osteopenia

## 2022-12-14 NOTE — Progress Notes (Signed)
Hematology/Oncology Progress note [phone] [phone]     Reason for visit Follow up for management of Stage IIA left breast cancer.   ASSESSMENT & PLAN:   Cancer Staging  Breast cancer [facility]) Staging form: Breast, AJCC 8th Edition - Pathologic stage from 11/09/2017: No Stage Recommended (ypT2, pN1a, cM0, G2, ER+, PR+, HER2-) - Signed by Earlie Server, MD on 11/09/2017  Malignant neoplasm of lower-outer quadrant of left breast of female, estrogen receptor positive ([place]) Staging form: Breast, AJCC 8th Edition - Clinical stage from 04/25/2017: Stage IIA (cT3(m), cN1(f), cM0, G2, ER+, PR+, HER2: Equivocal) - Signed by Earlie Server, MD on 05/23/2017   Malignant neoplasm of lower-outer quadrant of left breast of female, estrogen receptor positive ([place]) # cT3cN1cMo 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 S/p left modified radical mastectomy with ALND, s/p radiation.  ypT2N1a M0, declined adjuvant clinical trial Pabociclib + letrozole, currently on adjuvant endocrinology treatment with AI  Clinically she is doing well.  No evidence of disease recurrence. Labs reviewed and discussed with patient., tumor marker CA15-3 is elevated. I will obtain CT scan for evaluation. .  Continue letrozole 2.'5mg'$  daily.  Continue annual right breast mammogram - August 2024  #Skin telangiectasias due to previous RT.  Monitor clinically.   Osteopenia Proceed with zometa today. Continue calcium and vitamin D supplementation. Repeat bone density 04/18/22 shows slight progression of osteopenia  Orders Placed This Encounter  Procedures   CT CHEST ABDOMEN PELVIS W CONTRAST    Standing Status:   Future    Standing Expiration Date:   12/14/2023    Scheduling Instructions:     To be scheduled at next available slot    Order Specific Question:   If indicated for the ordered procedure, I  authorize the administration of contrast media per Radiology protocol    Answer:   Yes    Order Specific Question:   Preferred imaging location?    Answer:   [facility]    Order Specific Question:   Is Oral Contrast requested for this exam?    Answer:   Yes, Per Radiology protocol   MM 3D SCREENING MAMMOGRAM UNILATERAL RIGHT BREAST    Standing Status:   Future    Standing Expiration Date:   12/14/2023    Order Specific Question:   Reason for Exam (SYMPTOM  OR DIAGNOSIS REQUIRED)    Answer:   hx breast cancer    Order Specific Question:   Preferred imaging location?    Answer:   [facility]   CMP ([place] only)    Standing Status:   Future    Standing Expiration Date:   12/14/2023   Cancer antigen 27.29    Standing Status:   Future    Standing Expiration Date:   12/14/2023   Cancer antigen 15-3    Standing Status:   Future    Standing Expiration Date:   12/14/2023   CBC with Differential (Cancer Center Only)    Standing Status:   Future    Standing Expiration Date:   12/14/2023   Follow up 21month Lab prior to MD + Zometa All questions were answered. The patient knows to call the clinic with any problems, questions or concerns.  ZEarlie Server MD, PhD [facility] 12/14/2022       Pertinent Oncology History SRaliegh [id number]. female presents for follow up of breast  cancer. . Patient had a history a left breast mass biopsy followed by breast excisonal biopsy in 2015 with benign pathology.  She also has a history of Uterine cancer which was treated with RT with Dr.Chrystal.  She underwent routine screening mammogram on 04/17/2017 which recommended a possible mass on the left breast. Left diagnostic mammogram showed a highly suspicious left breast mass at 4 o'clock, 7 cm from the nipple and 1 o'clock,,  About 3 cm from the nipple. Enlarged left axillary lymph node suspicious for metastatic disease.  1 US breast confirmed a left breast mass of 2.1cm,  4 o'clock, and 1.1 cm mass at 1 o'clock.The masses at 1 and 4 o'clock are separated by approximately 4 cm sonographically 2 Biopsy of the two mass and axillary lymph node revealed invasive carcinoma, grade 2. Both are ER,PR positive. HER2 equivocal, FISH pending. 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  3 05/14/2017 MRI breast showed Two sites of biopsy proven malignancy seen in the left breast, which span up to 11.3 cm. There are intervening areas of non mass enhancement and a small suspicious enhancing mass. 4 Case was discussed on breast tumor board on 05/21/2017 and the panel agree with neoadjuvant ddAC to T followed by surgery. Clinically cT3N1M0, Stage IIA  # Genetic testing Testing did not reveal a pathogenic mutation in any of the genes analyzed.  A copy of the genetic test report will be scanned into Epic under the Media tab.The genes analyzed were the 83 genes on [facility]'s Multi-Cancer panel (ALK, APC, ATM, AXIN2, BAP1, BARD1, BLM, BMPR1A, BRCA1, BRCA2, BRIP1, CASR, CDC73, CDH1, CDK4, CDKN1B, CDKN1C, CDKN2A, CEBPA, CHEK2, CTNNA1, DICER1, DIS3L2, EGFR, EPCAM, FH, FLCN, GATA2, GPC3, GREM1, HOXB13, HRAS, KIT, MAX, MEN1, MET, MITF, MLH1, MSH2, MSH3, MSH6, MUTYH, NBN, NF1, NF2, NTHL1, PALB2, PDGFRA, PHOX2B, PMS2, POLD1, POLE, POT1, PRKAR1A, PTCH1, PTEN, RAD50, RAD51C, RAD51D, RB1, RECQL4, RET, RUNX1, SDHA, SDHAF2, SDHB, SDHC, SDHD, SMAD4, SMARCA4, SMARCB1, SMARCE1, STK11, SUFU, TERC, TERT, TMEM127, TP53, TSC1, TSC2, VHL, WRN, WT1).   #Cancer treatment  Neoadjuvant chemotherapy 05/23/2017- 10/03/2017 patient completed s/p 4 cycles ddAC and weekly taxol x 12, on 10/26/2017 patient underwent that surgery.  Left axillary sentinel lymph node was positive, so Dr. Tamala Julian proceeded with left mastectomy and left axillary lymph node dissection. Pathology showed ypT2(m)  pN1a, ER/PR positive, HER2 FISH negative, grade 2, negative margin, invasive mammary carcinoma.  S/p adjuvant RT.  Declined NATALEE  Trial Clinical trial for pabociclib and letrozole.   # May 2019 Started on Letrozole  # Mediport removed 09/18/2018  History of uterine cancer.  INTERVAL HISTORY Patient presents for follow up for history of breast cancer ypT2 yp N1, s/p neoadjuvant chemotherapy and surgery ad adjuvant RT.  Patient presents for 34-monthfollow-up. She feels well.  Patient has no new breast concerns. Tolerates letrozole well.   Review of Systems  Constitutional:  Negative for appetite change, chills, fatigue and fever.  HENT:   Negative for hearing loss and voice change.   Eyes:  Negative for eye problems.  Respiratory:  Negative for chest tightness and cough.   Cardiovascular:  Negative for chest pain.  Gastrointestinal:  Negative for abdominal distention, abdominal pain and blood in stool.  Endocrine: Positive for hot flashes.  Genitourinary:  Negative for difficulty urinating and frequency.   Musculoskeletal:  Negative for arthralgias.  Skin:  Negative for itching and rash.  Neurological:  Negative for extremity weakness.  Hematological:  Negative for adenopathy.  Psychiatric/Behavioral:  Negative for  confusion.     MEDICAL HISTORY: Past Medical History:  Diagnosis Date   Anemia    Arthritis    HIP-LEFT   Benign neoplasm of colon    Breast cancer ([place]) 2018   left breast   Cancer ([place])    Diabetes mellitus without complication ([place])    Genetic testing 01/10/2018   Multi-Cancer panel (83 genes) @ [facility] - No pathogenic mutations detected   H/O osteopenia    History of hiatal hernia    Hypertension    Personal history of chemotherapy    Personal history of radiation therapy    Sleep apnea    USE CPAP   Trigger finger, acquired    Uterine cancer ([place])    Uterine cancer ([place])     SURGICAL HISTORY: Past Surgical History:  Procedure Laterality Date   ABDOMINAL HYSTERECTOMY     BREAST BIOPSY Left 04/2014   negative   BREAST EXCISIONAL BIOPSY Left 2015   CESAREAN SECTION     X2    COLONOSCOPY WITH PROPOFOL N/A 04/15/2015   Procedure: COLONOSCOPY WITH PROPOFOL;  Surgeon: Hulen Luster, MD;  Location: [facility] ENDOSCOPY;  Service: Gastroenterology;  Laterality: N/A;   COLONOSCOPY WITH PROPOFOL N/A 08/23/2020   Procedure: COLONOSCOPY WITH PROPOFOL;  Surgeon: Toledo, Benay Pike, MD;  Location: [facility] ENDOSCOPY;  Service: Gastroenterology;  Laterality: N/A;   HERNIA REPAIR     MASTECTOMY Left 10/2017   MASTECTOMY MODIFIED RADICAL Left 10/26/2017   Procedure: MASTECTOMY MODIFIED RADICAL;  Surgeon: Leonie Green, MD;  Location: [facility] ORS;  Service: General;  Laterality: Left;   MASTECTOMY W/ SENTINEL NODE BIOPSY Left 10/26/2017   Procedure: MASTECTOMY WITH SENTINEL LYMPH NODE BIOPSY;  Surgeon: Leonie Green, MD;  Location: [facility] ORS;  Service: General;  Laterality: Left;   PORTACATH PLACEMENT Right 05/22/2017   Procedure: INSERTION PORT-A-CATH;  Surgeon: Leonie Green, MD;  Location: [facility] ORS;  Service: General;  Laterality: Right;    SOCIAL HISTORY: Social History   Socioeconomic History   Marital status: Married    Spouse name: Not on file   Number of children: Not on file   Years of education: Not on file   Highest education level: Not on file  Occupational History   Not on file  Tobacco Use   Smoking status: Never   Smokeless tobacco: Never  Vaping Use   Vaping Use: Never used  Substance and Sexual Activity   Alcohol use: No   Drug use: No   Sexual activity: Not on file  Other Topics Concern   Not on file  Social History Narrative   Not on file   Social Determinants of Health   Financial Resource Strain: Not on file  Food Insecurity: Not on file  Transportation Needs: Not on file  Physical Activity: Not on file  Stress: Not on file  Social Connections: Not on file  Intimate Partner Violence: Not on file    FAMILY HISTORY Family History  Problem Relation Age of Onset   Diabetes Mother    Diabetes Father    Melanoma Father 47       on  finger   Diabetes Sister    Pancreatic cancer Paternal Grandfather        deceased 64s   Breast cancer Other 90       paternal grandmother's mother    ALLERGIES:  is allergic to crestor [rosuvastatin].  MEDICATIONS:  Current Outpatient Medications  Medication Sig Dispense Refill   Ascorbic Acid (VITAMIN C)  100 MG tablet Take 100 mg by mouth daily.     Biotin 10 MG TABS Take by mouth.     Calcium Carb-Cholecalciferol (CALCIUM 600-D PO) Take 1 tablet by mouth daily.     Cinnamon 500 MG capsule Take 1,000 mg by mouth daily.      [employer] B6 100 MG tablet TAKE 1 TABLET BY MOUTH EVERY DAY 100 tablet 3   cyanocobalamin 1000 MCG tablet Take 1,000 mcg by mouth daily.     diphenhydrAMINE (BENADRYL) 25 MG tablet Take 25 mg by mouth every 6 (six) hours as needed for allergies.      ferrous sulfate 325 (65 FE) MG tablet Take 325 mg by mouth daily with breakfast. OTC     glimepiride (AMARYL) 2 MG tablet Take by mouth.     GLUCOSAMINE-CHONDROITIN PO Take 1 tablet by mouth daily.     letrozole (FEMARA) 2.5 MG tablet TAKE 1 TABLET BY MOUTH EVERY DAY 90 tablet 1   lisinopril-hydrochlorothiazide (PRINZIDE,ZESTORETIC) 20-12.5 MG per tablet Take 1 tablet by mouth daily.     meloxicam (MOBIC) 15 MG tablet Take 15 mg by mouth daily as needed.     metFORMIN (GLUCOPHAGE) 500 MG tablet Take 1,000 mg by mouth 2 (two) times daily.      Multiple Vitamin (MULTIVITAMIN) tablet Take 1 tablet by mouth daily.     pioglitazone (ACTOS) 30 MG tablet Take 30 mg by mouth daily.     loperamide (IMODIUM) 2 MG capsule Take 1 capsule (2 mg total) by mouth See admin instructions. With onset of loose stool, take '4mg'$  followed by '2mg'$  every 2 hours until 12 hours have passed without loose bowel movement. Maximum: 16 mg/day (Patient not taking: Reported on 06/01/2022) 120 capsule 1   trospium (SANCTURA) 20 MG tablet Take 20 mg by mouth 2 (two) times daily.     No current facility-administered medications for this visit.    PHYSICAL  EXAMINATION:  ECOG PERFORMANCE STATUS: 1 - Symptomatic but completely ambulatory  Vitals:   12/14/22 1333  BP: 127/60  Pulse: (!) 101  Resp: 18  Temp: 97.6 F (36.4 C)  SpO2: 99%    Filed Weights   12/14/22 1333  Weight: 229 lb 1.6 oz (103.9 kg)     Physical Exam Constitutional:      General: She is not in acute distress.    Appearance: She is not diaphoretic.     Comments: Obese,   HENT:     Head: Normocephalic and atraumatic.  Eyes:     General: No scleral icterus.    Pupils: Pupils are equal, round, and reactive to light.  Cardiovascular:     Rate and Rhythm: Normal rate and regular rhythm.     Heart sounds: No murmur heard. Pulmonary:     Effort: Pulmonary effort is normal. No respiratory distress.     Breath sounds: No rales.  Chest:     Chest wall: No tenderness.  Abdominal:     General: There is no distension.     Palpations: Abdomen is soft.     Tenderness: There is no abdominal tenderness.  Musculoskeletal:        General: Normal range of motion.     Cervical back: Normal range of motion and neck supple.  Skin:    General: Skin is warm and dry.     Findings: No erythema.  Neurological:     Mental Status: She is alert and oriented to person, place, and time.  Cranial Nerves: No cranial nerve deficit.     Motor: No abnormal muscle tone.     Coordination: Coordination normal.  Psychiatric:        Mood and Affect: Affect normal.   History of left mastectomy.  No palpable chest wall mass. Radiation-induced telangiectasias.    LABORATORY DATA:    Latest Ref Rng & Units 12/04/2022    9:12 AM 05/23/2022    9:11 AM 11/25/2021    9:57 AM  CBC  WBC 4.0 - 10.5 K/uL 8.6  8.3  9.8   Hemoglobin 12.0 - 15.0 g/dL 12.7  12.6  12.3   Hematocrit 36.0 - 46.0 % 38.1  37.6  37.7   Platelets 150 - 400 K/uL 366  322  332       Latest Ref Rng & Units 12/04/2022    9:12 AM 05/23/2022    9:11 AM 11/25/2021    9:57 AM  CMP  Glucose 70 - 99 mg/dL 260  177  181    BUN 8 - 23 mg/dL '26  24  22   '$ Creatinine 0.44 - 1.00 mg/dL 0.84  0.74  0.78   Sodium 135 - 145 mmol/L 132  136  133   Potassium 3.5 - 5.1 mmol/L 3.8  4.0  4.1   Chloride 98 - 111 mmol/L 95  102  94   CO2 22 - 32 mmol/L '25  25  28   '$ Calcium 8.9 - 10.3 mg/dL 9.7  10.3  9.9   Total Protein 6.5 - 8.1 g/dL 7.4  7.6  7.6   Total Bilirubin 0.3 - 1.2 mg/dL 0.4  0.5  0.1   Alkaline Phos 38 - 126 U/L 82  67  81   AST 15 - 41 U/L '22  19  20   '$ ALT 0 - 44 U/L '17  15  13    '$ RADIOGRAPHIC STUDIES: I have personally reviewed the radiological images as listed and agreed with the findings in the report. No results found. [name]

## 2022-12-15 MED ORDER — LETROZOLE 2.5 MG PO TABS: 2.5000 mg | ORAL_TABLET | Freq: Every day | ORAL | 1 refills | Status: DC

## 2022-12-15 NOTE — Addendum Note (Signed)
Addended by: Earlie Server on: 12/15/2022 08:37 AM   Modules accepted: Orders

## 2022-12-21 ENCOUNTER — Ambulatory Visit
Admission: RE | Admit: 2022-12-21 | Discharge: 2022-12-21 | Disposition: A | Discharge status: Home / Self Care | Payer: Medicare HMO | Source: Ambulatory Visit | Attending: Oncology | Admitting: Oncology

## 2022-12-21 DIAGNOSIS — C50412 Malignant neoplasm of upper-outer quadrant of left female breast: Secondary | ICD-10-CM | POA: Diagnosis present

## 2022-12-21 DIAGNOSIS — Z170 Estrogen receptor positive status [ER+]: Secondary | ICD-10-CM | POA: Diagnosis present

## 2022-12-21 DIAGNOSIS — R978 Other abnormal tumor markers: Secondary | ICD-10-CM | POA: Insufficient documentation

## 2022-12-21 MED ORDER — IOHEXOL 300 MG/ML  SOLN
100.0000 mL | Freq: Once | INTRAMUSCULAR | Status: AC | PRN
  Administered 2022-12-21: 100 mL via INTRAVENOUS

## 2023-05-13 ENCOUNTER — Ambulatory Visit
Admission: EM | Admit: 2023-05-13 | Discharge: 2023-05-13 | Disposition: A | Discharge status: Home / Self Care | Payer: Medicare HMO | Attending: Emergency Medicine | Admitting: Emergency Medicine

## 2023-05-13 DIAGNOSIS — H9201 Otalgia, right ear: Secondary | ICD-10-CM

## 2023-05-13 MED ORDER — CIPROFLOXACIN-DEXAMETHASONE 0.3-0.1 % OT SUSP: 4.0000 [drp] | Freq: Two times a day (BID) | OTIC | 0 refills | Status: DC

## 2023-05-13 MED ORDER — IPRATROPIUM BROMIDE 0.03 % NA SOLN: 2.0000 | Freq: Two times a day (BID) | NASAL | 12 refills | Status: DC

## 2023-05-13 NOTE — Discharge Instructions (Signed)
Today you are evaluated for your ear pain  On exam there are no abnormalities to the eardrum or the ear canal that would indicate infection or cause of your symptoms  As your symptoms have been persistent for 7 days we will prophylactically provide eardrops to see if this will give you any form of relief  Place 4 drops of Ciprodex every morning and every evening into the right ear, this is a mixture of an antibiotic and a steroid, complete this for 7 days  Use Atrovent nasal spray every morning and every evening for at least 7 days, this medicine helps to clear out the sinuses to ensure this is not contributing to your ear discomfort  At your upcoming appointment with your primary doctor please have them recheck your ears  You may use Tylenol as needed for any discomfort  You may hold warm compresses to the external ear for any discomfort

## 2023-05-13 NOTE — ED Provider Notes (Signed)
[facility]    CSN: [id number] Arrival date & time: 05/13/23  1110      History   Chief Complaint Chief Complaint  Patient presents with   Ear Pain    HPI Dominique Guzman is a 72 y.o. female.   Patient presents for evaluation of constant right-sided ear pain beginning 7 days ago.  Fluctuating intensity with intermittent sharp pains.  Has attempted use of alcohol which has been ineffective.  Had family member attempt to look inside the ear but did not see anything of concern.  Denies drainage, pruritus, decreased hearing, congestion or fever.   Past Medical History:  Diagnosis Date   Anemia    Arthritis    HIP-LEFT   Benign neoplasm of colon    Breast cancer ([facility]) 2018   left breast   Cancer ([facility])    Diabetes mellitus without complication ([facility])    Genetic testing 01/10/2018   Multi-Cancer panel (83 genes) @ [facility] - No pathogenic mutations detected   H/O osteopenia    History of hiatal hernia    Hypertension    Personal history of chemotherapy    Personal history of radiation therapy    Sleep apnea    USE CPAP   Trigger finger, acquired    Uterine cancer ([facility])    Uterine cancer [facility])     Patient Active Problem List   Diagnosis Date Noted   Genetic testing 01/10/2018   Breast cancer ([facility]) 10/26/2017   Dehydration 09/05/2017   Osteopenia 05/04/2017   Malignant neoplasm of lower-outer quadrant of left breast of female, estrogen receptor positive ([facility]) 05/04/2017   History of uterine cancer 04/04/2017   Chronic midline low back pain without sciatica 11/18/2015   Controlled type 2 diabetes mellitus without complication, without long-term current use of insulin ([facility]) 11/18/2015   Essential hypertension 11/18/2015   Diabetes type 2, controlled ([facility]) 05/19/2015   HTN (hypertension) 05/25/2014   Allergic rhinitis 02/11/2014    Past Surgical History:  Procedure Laterality Date   ABDOMINAL HYSTERECTOMY     BREAST BIOPSY Left 04/2014   negative    BREAST EXCISIONAL BIOPSY Left 2015   CESAREAN SECTION     X2   COLONOSCOPY WITH PROPOFOL N/A 04/15/2015   Procedure: COLONOSCOPY WITH PROPOFOL;  Surgeon: Wallace Cullens, MD;  Location: [facility] ENDOSCOPY;  Service: Gastroenterology;  Laterality: N/A;   COLONOSCOPY WITH PROPOFOL N/A 08/23/2020   Procedure: COLONOSCOPY WITH PROPOFOL;  Surgeon: Toledo, Boykin Nearing, MD;  Location: [facility] ENDOSCOPY;  Service: Gastroenterology;  Laterality: N/A;   HERNIA REPAIR     MASTECTOMY Left 10/2017   MASTECTOMY MODIFIED RADICAL Left 10/26/2017   Procedure: MASTECTOMY MODIFIED RADICAL;  Surgeon: Nadeen Landau, MD;  Location: [facility] ORS;  Service: General;  Laterality: Left;   MASTECTOMY W/ SENTINEL NODE BIOPSY Left 10/26/2017   Procedure: MASTECTOMY WITH SENTINEL LYMPH NODE BIOPSY;  Surgeon: Nadeen Landau, MD;  Location: [facility] ORS;  Service: General;  Laterality: Left;   PORTACATH PLACEMENT Right 05/22/2017   Procedure: INSERTION PORT-A-CATH;  Surgeon: Nadeen Landau, MD;  Location: [facility] ORS;  Service: General;  Laterality: Right;    OB History   No obstetric history on file.      Home Medications    Prior to Admission medications   Medication Sig Start Date End Date Taking? Authorizing Provider  diphenhydrAMINE (BENADRYL) 25 MG tablet Take 25 mg by mouth every 6 (six) hours as needed for allergies.    Yes [provider]  letrozole (FEMARA) 2.5 MG tablet Take 1 tablet (2.5 mg total) by mouth daily. 12/15/22  Yes Rickard Patience, MD  lisinopril-hydrochlorothiazide (PRINZIDE,ZESTORETIC) 20-12.5 MG per tablet Take 1 tablet by mouth daily.   Yes [provider]  loperamide (IMODIUM) 2 MG capsule Take 1 capsule (2 mg total) by mouth See admin instructions. With onset of loose stool, take 4mg  followed by 2mg  every 2 hours until 12 hours have passed without loose bowel movement. Maximum: 16 mg/day 05/16/17  Yes Rickard Patience, MD  meloxicam (MOBIC) 15 MG tablet Take 15 mg by mouth daily as needed.   Yes  [provider]  metFORMIN (GLUCOPHAGE) 500 MG tablet Take 1,000 mg by mouth 2 (two) times daily.    Yes [provider]  Ascorbic Acid (VITAMIN C) 100 MG tablet Take 100 mg by mouth daily.    [provider]  Biotin 10 MG TABS Take by mouth.    [provider]  Calcium Carb-Cholecalciferol (CALCIUM 600-D PO) Take 1 tablet by mouth daily.    [provider]  Cinnamon 500 MG capsule Take 1,000 mg by mouth daily.     [provider]  CVS B6 100 MG tablet TAKE 1 TABLET BY MOUTH EVERY DAY 01/17/19   Rickard Patience, MD  cyanocobalamin 1000 MCG tablet Take 1,000 mcg by mouth daily.    [provider]  ferrous sulfate 325 (65 FE) MG tablet Take 325 mg by mouth daily with breakfast. OTC    [provider]  glimepiride (AMARYL) 2 MG tablet Take by mouth. 11/30/22 11/30/23  [provider]  GLUCOSAMINE-CHONDROITIN PO Take 1 tablet by mouth daily.    [provider]  Multiple Vitamin (MULTIVITAMIN) tablet Take 1 tablet by mouth daily.    [provider]  pioglitazone (ACTOS) 30 MG tablet Take 30 mg by mouth daily. 08/16/19   [provider]  trospium (SANCTURA) 20 MG tablet Take 20 mg by mouth 2 (two) times daily.    [provider]    Family History Family History  Problem Relation Age of Onset   Diabetes Mother    Diabetes Father    Melanoma Father 86       on finger   Diabetes Sister    Pancreatic cancer Paternal Grandfather        deceased 59s   Breast cancer Other 41       paternal grandmother's mother    Social History Social History   Tobacco Use   Smoking status: Never   Smokeless tobacco: Never  Vaping Use   Vaping status: Never Used  Substance Use Topics   Alcohol use: No   Drug use: No     Allergies   Crestor [rosuvastatin]   Review of Systems Review of Systems   Physical Exam Triage Vital Signs ED Triage Vitals  Encounter Vitals Group     BP 05/13/23 1143  126/72     Systolic BP Percentile --      Diastolic BP Percentile --      Pulse Rate 05/13/23 1143 97     Resp --      Temp 05/13/23 1143 98.1 F (36.7 C)     Temp Source 05/13/23 1143 Oral     SpO2 05/13/23 1143 96 %     Weight 05/13/23 1140 230 lb (104.3 kg)     Height 05/13/23 1140 4\' 10"  (1.473 m)     Head Circumference --      Peak Flow --  Pain Score 05/13/23 1140 7     Pain Loc --      Pain Education --      Exclude from Growth Chart --    No data found.  Updated Vital Signs BP 126/72 (BP Location: Left Arm)   Pulse 97   Temp 98.1 F (36.7 C) (Oral)   Ht 4\' 10"  (1.473 m)   Wt 230 lb (104.3 kg)   LMP  (LMP Unknown)   SpO2 96%   BMI 48.07 kg/m   Visual Acuity Right Eye Distance:   Left Eye Distance:   Bilateral Distance:    Right Eye Near:   Left Eye Near:    Bilateral Near:     Physical Exam Constitutional:      Appearance: Normal appearance.  HENT:     Right Ear: Tympanic membrane, ear canal and external ear normal.     Left Ear: Tympanic membrane, ear canal and external ear normal.  Eyes:     Extraocular Movements: Extraocular movements intact.  Pulmonary:     Effort: Pulmonary effort is normal.  Neurological:     Mental Status: She is alert and oriented to person, place, and time.      UC Treatments / Results  Labs (all labs ordered are listed, but only abnormal results are displayed) Labs Reviewed - No data to display  EKG   Radiology No results found.  Procedures Procedures (including critical care time)  Medications Ordered in UC Medications - No data to display  Initial Impression / Assessment and Plan / UC Course  I have reviewed the triage vital signs and the nursing notes.  Pertinent labs & imaging results that were available during my care of the patient were reviewed by me and considered in my medical decision making (see chart for details).  Otalgia of the right ear  No abnormality noted on exam, discussed this  with patient, symptoms have persisted for 7 days prophylactically providing eardrop, Ciprodex sent to pharmacy as well as after today to provide coverage for sinus involvement, discussed administration, recommended over-the-counter Tylenol and warm compresses to the external ear for support, has upcoming PCP appointment within the next 1 to 2 weeks, may have ears rechecked at that time, may follow-up in urgent care or any point if symptoms worsen Final Clinical Impressions(s) / UC Diagnoses   Final diagnoses:  None   Discharge Instructions   None    ED Prescriptions   None    PDMP not reviewed this encounter.   Valinda Hoar, NP 05/13/23 [phone]

## 2023-05-13 NOTE — ED Triage Notes (Signed)
Pt c/o possible right ear infection x1week  Pt states that she has been treating it at home by dropping alcohol in her ear.

## 2023-05-16 ENCOUNTER — Ambulatory Visit
Admission: RE | Admit: 2023-05-16 | Discharge: 2023-05-16 | Disposition: A | Discharge status: Home / Self Care | Payer: Medicare HMO | Source: Ambulatory Visit | Attending: Oncology | Admitting: Oncology

## 2023-05-16 DIAGNOSIS — Z1231 Encounter for screening mammogram for malignant neoplasm of breast: Secondary | ICD-10-CM | POA: Diagnosis not present

## 2023-05-16 DIAGNOSIS — Z170 Estrogen receptor positive status [ER+]: Secondary | ICD-10-CM | POA: Insufficient documentation

## 2023-05-16 DIAGNOSIS — C50512 Malignant neoplasm of lower-outer quadrant of left female breast: Secondary | ICD-10-CM | POA: Insufficient documentation

## 2023-06-13 ENCOUNTER — Inpatient Hospital Stay: Payer: Medicare HMO | Attending: Oncology

## 2023-06-13 DIAGNOSIS — Z170 Estrogen receptor positive status [ER+]: Secondary | ICD-10-CM | POA: Insufficient documentation

## 2023-06-13 DIAGNOSIS — Z923 Personal history of irradiation: Secondary | ICD-10-CM | POA: Diagnosis not present

## 2023-06-13 DIAGNOSIS — Z9012 Acquired absence of left breast and nipple: Secondary | ICD-10-CM | POA: Insufficient documentation

## 2023-06-13 DIAGNOSIS — C50512 Malignant neoplasm of lower-outer quadrant of left female breast: Secondary | ICD-10-CM | POA: Insufficient documentation

## 2023-06-13 DIAGNOSIS — Z79811 Long term (current) use of aromatase inhibitors: Secondary | ICD-10-CM | POA: Diagnosis not present

## 2023-06-13 DIAGNOSIS — M8580 Other specified disorders of bone density and structure, unspecified site: Secondary | ICD-10-CM | POA: Diagnosis present

## 2023-06-13 LAB — CBC WITH DIFFERENTIAL (CANCER CENTER ONLY)
Abs Immature Granulocytes: 0.24 10*3/uL — ABNORMAL HIGH (ref 0.00–0.07)
Basophils Absolute: 0.1 10*3/uL (ref 0.0–0.1)
Basophils Relative: 1 %
Eosinophils Absolute: 0.3 10*3/uL (ref 0.0–0.5)
Eosinophils Relative: 3 %
HCT: 37.7 % (ref 36.0–46.0)
Hemoglobin: 12.6 g/dL (ref 12.0–15.0)
Immature Granulocytes: 2 %
Lymphocytes Relative: 17 %
Lymphs Abs: 1.8 10*3/uL (ref 0.7–4.0)
MCH: 28.4 pg (ref 26.0–34.0)
MCHC: 33.4 g/dL (ref 30.0–36.0)
MCV: 85.1 fL (ref 80.0–100.0)
Monocytes Absolute: 0.9 10*3/uL (ref 0.1–1.0)
Monocytes Relative: 8 %
Neutro Abs: 7.4 10*3/uL (ref 1.7–7.7)
Neutrophils Relative %: 69 %
Platelet Count: 378 10*3/uL (ref 150–400)
RBC: 4.43 MIL/uL (ref 3.87–5.11)
RDW: 13.4 % (ref 11.5–15.5)
WBC Count: 10.7 10*3/uL — ABNORMAL HIGH (ref 4.0–10.5)
nRBC: 0 % (ref 0.0–0.2)

## 2023-06-13 LAB — CMP (CANCER CENTER ONLY)
ALT: 12 U/L (ref 0–44)
AST: 17 U/L (ref 15–41)
Albumin: 3.8 g/dL (ref 3.5–5.0)
Alkaline Phosphatase: 74 U/L (ref 38–126)
Anion gap: 10 (ref 5–15)
BUN: 29 mg/dL — ABNORMAL HIGH (ref 8–23)
CO2: 23 mmol/L (ref 22–32)
Calcium: 9.7 mg/dL (ref 8.9–10.3)
Chloride: 98 mmol/L (ref 98–111)
Creatinine: 0.94 mg/dL (ref 0.44–1.00)
GFR, Estimated: >60 mL/min (ref >60)
Glucose, Bld: 194 mg/dL — ABNORMAL HIGH (ref 70–99)
Potassium: 4.4 mmol/L (ref 3.5–5.1)
Sodium: 131 mmol/L — ABNORMAL LOW (ref 135–145)
Total Bilirubin: 0.4 mg/dL (ref 0.3–1.2)
Total Protein: 7.1 g/dL (ref 6.5–8.1)

## 2023-06-14 LAB — CANCER ANTIGEN 15-3: CA 15-3: 24 U/mL (ref 0.0–25.0)

## 2023-06-14 LAB — CANCER ANTIGEN 27.29: CA 27.29: 24 U/mL (ref 0.0–38.6)

## 2023-06-15 ENCOUNTER — Other Ambulatory Visit: Payer: Medicare HMO

## 2023-06-18 ENCOUNTER — Inpatient Hospital Stay: Payer: Medicare HMO

## 2023-06-18 ENCOUNTER — Encounter: Payer: Self-pay | Admitting: Oncology

## 2023-06-18 ENCOUNTER — Inpatient Hospital Stay: Payer: Medicare HMO | Admitting: Oncology

## 2023-06-18 VITALS — BP 106/56 | HR 96 | Temp 97.6°F | Resp 19 | Wt 220.5 lb

## 2023-06-18 VITALS — BP 97/66 | HR 93

## 2023-06-18 DIAGNOSIS — E860 Dehydration: Secondary | ICD-10-CM

## 2023-06-18 DIAGNOSIS — M8589 Other specified disorders of bone density and structure, multiple sites: Secondary | ICD-10-CM

## 2023-06-18 DIAGNOSIS — C50512 Malignant neoplasm of lower-outer quadrant of left female breast: Secondary | ICD-10-CM | POA: Diagnosis not present

## 2023-06-18 DIAGNOSIS — Z170 Estrogen receptor positive status [ER+]: Secondary | ICD-10-CM

## 2023-06-18 DIAGNOSIS — M8580 Other specified disorders of bone density and structure, unspecified site: Secondary | ICD-10-CM | POA: Diagnosis not present

## 2023-06-18 MED ORDER — SODIUM CHLORIDE 0.9 % IV SOLN
INTRAVENOUS | Status: DC
  Filled 2023-06-18: qty 250

## 2023-06-18 MED ORDER — LETROZOLE 2.5 MG PO TABS: 2.5000 mg | ORAL_TABLET | Freq: Every day | ORAL | 1 refills | Status: DC

## 2023-06-18 MED ORDER — ZOLEDRONIC ACID 4 MG/100ML IV SOLN
4.0000 mg | Freq: Once | INTRAVENOUS | Status: AC
  Administered 2023-06-18: 4 mg via INTRAVENOUS
  Filled 2023-06-18: qty 100

## 2023-06-18 NOTE — Assessment & Plan Note (Signed)
bone density 04/18/22 shows slight progression of osteopenia Proceed with zometa today. Continue calcium and vitamin D supplementation.

## 2023-06-18 NOTE — Patient Instructions (Signed)

## 2023-06-18 NOTE — Progress Notes (Signed)
Hematology/Oncology Progress note [phone] [phone]     Reason for visit Follow up for management of Stage IIA left breast cancer.   ASSESSMENT & PLAN:   Cancer Staging  Breast cancer [facility]) Staging form: Breast, AJCC 8th Edition - Pathologic stage from 11/09/2017: No Stage Recommended (ypT2, pN1a, cM0, G2, ER+, PR+, HER2-) - Signed by Rickard Patience, MD on 11/09/2017  Malignant neoplasm of lower-outer quadrant of left breast of female, estrogen receptor positive (HCC) Staging form: Breast, AJCC 8th Edition - Clinical stage from 04/25/2017: Stage IIA (cT3(m), cN1(f), cM0, G2, ER+, PR+, HER2: Equivocal) - Signed by Rickard Patience, MD on 05/23/2017   Malignant neoplasm of lower-outer quadrant of left breast of female, estrogen receptor positive (HCC) # cT3cN1cMo 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 S/p left modified radical mastectomy with ALND, s/p radiation.  ypT2N1a M0, declined adjuvant clinical trial Pabociclib + letrozole, currently on adjuvant endocrinology treatment with AI  Clinically she is doing well.  No evidence of disease recurrence.  Labs reviewed and discussed with patient., tumor marker CA15-3 is elevated-no CT evidence of recurrence.  Tumor marker has normalized.. .  Continue letrozole 2.5mg  daily. Refills sent.  Recommend to extend endocrine therapy beyond 5 years. Continue annual right breast mammogram -next due August 2025    Osteopenia bone density 04/18/22 shows slight progression of osteopenia Proceed with zometa today. Continue calcium and vitamin D supplementation.   Orders Placed This Encounter  Procedures   CBC with Differential (Cancer Center Only)    Standing Status:   Future    Standing Expiration Date:   06/17/2024   CMP (Cancer Center only)    Standing Status:   Future    Standing Expiration Date:   06/17/2024   Cancer  antigen 27.29    Standing Status:   Future    Standing Expiration Date:   06/17/2024   Cancer antigen 15-3    Standing Status:   Future    Standing Expiration Date:   06/17/2024   Follow up 6months Lab prior to MD + Zometa All questions were answered. The patient knows to call the clinic with any problems, questions or concerns.  Rickard Patience, MD, PhD [facility] Hematology Oncology 06/18/2023       Pertinent Oncology History Clovis Fredrickson 72 y.o. female presents for follow up of breast cancer. . Patient had a history a left breast mass biopsy followed by breast excisonal biopsy in 2015 with benign pathology.  She also has a history of Uterine cancer which was treated with RT with Dr.Chrystal.  She underwent routine screening mammogram on 04/17/2017 which recommended a possible mass on the left breast. Left diagnostic mammogram showed a highly suspicious left breast mass at 4 o'clock, 7 cm from the nipple and 1 o'clock,,  About 3 cm from the nipple. Enlarged left axillary lymph node suspicious for metastatic disease.  1 US breast confirmed a left breast mass of 2.1cm, 4 o'clock, and 1.1 cm mass at 1 o'clock.The masses at 1 and 4 o'clock are separated by approximately 4 cm sonographically 2 Biopsy of the two mass and axillary lymph node revealed invasive carcinoma, grade 2. Both are ER,PR positive. HER2 equivocal, FISH pending. 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  3 05/14/2017 MRI breast showed Two sites of biopsy proven malignancy seen in the left breast, which span up to 11.3 cm. There are  intervening areas of non mass enhancement and a small suspicious enhancing mass. 4 Case was discussed on breast tumor board on 05/21/2017 and the panel agree with neoadjuvant ddAC to T followed by surgery. Clinically cT3N1M0, Stage IIA  # Genetic testing Testing did not reveal a pathogenic mutation in any of the genes analyzed.  A copy of the genetic test report will be scanned into Epic  under the Media tab.The genes analyzed were the 83 genes on [facility]'s Multi-Cancer panel (ALK, APC, ATM, AXIN2, BAP1, BARD1, BLM, BMPR1A, BRCA1, BRCA2, BRIP1, CASR, CDC73, CDH1, CDK4, CDKN1B, CDKN1C, CDKN2A, CEBPA, CHEK2, CTNNA1, DICER1, DIS3L2, EGFR, EPCAM, FH, FLCN, GATA2, GPC3, GREM1, HOXB13, HRAS, KIT, MAX, MEN1, MET, MITF, MLH1, MSH2, MSH3, MSH6, MUTYH, NBN, NF1, NF2, NTHL1, PALB2, PDGFRA, PHOX2B, PMS2, POLD1, POLE, POT1, PRKAR1A, PTCH1, PTEN, RAD50, RAD51C, RAD51D, RB1, RECQL4, RET, RUNX1, SDHA, SDHAF2, SDHB, SDHC, SDHD, SMAD4, SMARCA4, SMARCB1, SMARCE1, STK11, SUFU, TERC, TERT, TMEM127, TP53, TSC1, TSC2, VHL, WRN, WT1).   #Cancer treatment  Neoadjuvant chemotherapy 05/23/2017- 10/03/2017 patient completed s/p 4 cycles ddAC and weekly taxol x 12, on 10/26/2017 patient underwent that surgery.  Left axillary sentinel lymph node was positive, so Dr. Katrinka Blazing proceeded with left mastectomy and left axillary lymph node dissection. Pathology showed ypT2(m)  pN1a, ER/PR positive, HER2 FISH negative, grade 2, negative margin, invasive mammary carcinoma.  S/p adjuvant RT.  Declined [facility] Trial Clinical trial for pabociclib and letrozole.   # May 2019 Started on Letrozole  # Mediport removed 09/18/2018  History of uterine cancer.  INTERVAL HISTORY Patient presents for follow up for history of breast cancer ypT2 yp N1, s/p neoadjuvant chemotherapy and surgery ad adjuvant RT.  Patient presents for 43-month follow-up. She feels well.  Patient has no new breast concerns. Tolerates letrozole well. + Knee pain she is going to see orthopedic surgeon.   Review of Systems  Constitutional:  Negative for appetite change, chills, fatigue and fever.  HENT:   Negative for hearing loss and voice change.   Eyes:  Negative for eye problems.  Respiratory:  Negative for chest tightness and cough.   Cardiovascular:  Negative for chest pain.  Gastrointestinal:  Negative for abdominal distention, abdominal pain and  blood in stool.  Endocrine: Positive for hot flashes.  Genitourinary:  Negative for difficulty urinating and frequency.   Musculoskeletal:  Positive for arthralgias.  Skin:  Negative for itching and rash.  Neurological:  Negative for extremity weakness.  Hematological:  Negative for adenopathy.  Psychiatric/Behavioral:  Negative for confusion.     MEDICAL HISTORY: Past Medical History:  Diagnosis Date   Anemia    Arthritis    HIP-LEFT   Benign neoplasm of colon    Breast cancer (HCC) 2018   left breast   Cancer (HCC)    Diabetes mellitus without complication (HCC)    Genetic testing 01/10/2018   Multi-Cancer panel (83 genes) @ [facility] - No pathogenic mutations detected   H/O osteopenia    History of hiatal hernia    Hypertension    Personal history of chemotherapy    Personal history of radiation therapy    Sleep apnea    USE CPAP   Trigger finger, acquired    Uterine cancer (HCC)    Uterine cancer (HCC)     SURGICAL HISTORY: Past Surgical History:  Procedure Laterality Date   ABDOMINAL HYSTERECTOMY     BREAST BIOPSY Left 04/2014   negative   BREAST EXCISIONAL BIOPSY Left 2015   CESAREAN SECTION  X2   COLONOSCOPY WITH PROPOFOL N/A 04/15/2015   Procedure: COLONOSCOPY WITH PROPOFOL;  Surgeon: Wallace Cullens, MD;  Location: [facility] ENDOSCOPY;  Service: Gastroenterology;  Laterality: N/A;   COLONOSCOPY WITH PROPOFOL N/A 08/23/2020   Procedure: COLONOSCOPY WITH PROPOFOL;  Surgeon: Toledo, Boykin Nearing, MD;  Location: [facility] ENDOSCOPY;  Service: Gastroenterology;  Laterality: N/A;   HERNIA REPAIR     MASTECTOMY Left 10/2017   MASTECTOMY MODIFIED RADICAL Left 10/26/2017   Procedure: MASTECTOMY MODIFIED RADICAL;  Surgeon: Nadeen Landau, MD;  Location: [facility] ORS;  Service: General;  Laterality: Left;   MASTECTOMY W/ SENTINEL NODE BIOPSY Left 10/26/2017   Procedure: MASTECTOMY WITH SENTINEL LYMPH NODE BIOPSY;  Surgeon: Nadeen Landau, MD;  Location: [facility] ORS;  Service:  General;  Laterality: Left;   PORTACATH PLACEMENT Right 05/22/2017   Procedure: INSERTION PORT-A-CATH;  Surgeon: Nadeen Landau, MD;  Location: [facility] ORS;  Service: General;  Laterality: Right;    SOCIAL HISTORY: Social History   Socioeconomic History   Marital status: Married    Spouse name: Not on file   Number of children: Not on file   Years of education: Not on file   Highest education level: Not on file  Occupational History   Not on file  Tobacco Use   Smoking status: Never   Smokeless tobacco: Never  Vaping Use   Vaping status: Never Used  Substance and Sexual Activity   Alcohol use: No   Drug use: No   Sexual activity: Not on file  Other Topics Concern   Not on file  Social History Narrative   Not on file   Social Determinants of Health   Financial Resource Strain: Not on file  Food Insecurity: Not on file  Transportation Needs: Not on file  Physical Activity: Not on file  Stress: Not on file  Social Connections: Not on file  Intimate Partner Violence: Not on file    FAMILY HISTORY Family History  Problem Relation Age of Onset   Diabetes Mother    Diabetes Father    Melanoma Father 59       on finger   Diabetes Sister    Pancreatic cancer Paternal Grandfather        deceased 10s   Breast cancer Other 60       paternal grandmother's mother    ALLERGIES:  is allergic to crestor [rosuvastatin].  MEDICATIONS:  Current Outpatient Medications  Medication Sig Dispense Refill   diphenhydrAMINE (BENADRYL) 25 MG tablet Take 25 mg by mouth every 6 (six) hours as needed for allergies.      glimepiride (AMARYL) 2 MG tablet Take by mouth.     GLUCOSAMINE-CHONDROITIN PO Take 1 tablet by mouth daily.     lisinopril-hydrochlorothiazide (PRINZIDE,ZESTORETIC) 20-12.5 MG per tablet Take 1 tablet by mouth daily.     loperamide (IMODIUM) 2 MG capsule Take 1 capsule (2 mg total) by mouth See admin instructions. With onset of loose stool, take 4mg  followed by 2mg   every 2 hours until 12 hours have passed without loose bowel movement. Maximum: 16 mg/day 120 capsule 1   meloxicam (MOBIC) 15 MG tablet Take 15 mg by mouth daily as needed.     metFORMIN (GLUCOPHAGE) 500 MG tablet Take 1,000 mg by mouth 2 (two) times daily.      Multiple Vitamin (MULTIVITAMIN) tablet Take 1 tablet by mouth daily.     pioglitazone (ACTOS) 30 MG tablet Take 30 mg by mouth daily.     trospium (SANCTURA)  20 MG tablet Take 20 mg by mouth 2 (two) times daily.     Ascorbic Acid (VITAMIN C) 100 MG tablet Take 100 mg by mouth daily. (Patient not taking: Reported on 06/18/2023)     Calcium Carb-Cholecalciferol (CALCIUM 600-D PO) Take 1 tablet by mouth daily. (Patient not taking: Reported on 06/18/2023)     Cinnamon 500 MG capsule Take 1,000 mg by mouth daily.  (Patient not taking: Reported on 06/18/2023)     ciprofloxacin-dexamethasone (CIPRODEX) OTIC suspension Place 4 drops into the right ear 2 (two) times daily. (Patient not taking: Reported on 06/18/2023) 7.5 mL 0   ipratropium (ATROVENT) 0.03 % nasal spray Place 2 sprays into both nostrils every 12 (twelve) hours. (Patient not taking: Reported on 06/18/2023) 30 mL 12   letrozole (FEMARA) 2.5 MG tablet Take 1 tablet (2.5 mg total) by mouth daily. 90 tablet 1   No current facility-administered medications for this visit.    PHYSICAL EXAMINATION:  ECOG PERFORMANCE STATUS: 1 - Symptomatic but completely ambulatory  Vitals:   06/18/23 1335  BP: (!) 106/56  Pulse: 96  Resp: 19  Temp: 97.6 F (36.4 C)  SpO2: 94%    Filed Weights   06/18/23 1335  Weight: 220 lb 8 oz (100 kg)     Physical Exam Constitutional:      General: She is not in acute distress.    Appearance: She is obese. She is not diaphoretic.     Comments: Patient sits in wheelchair.     HENT:     Head: Normocephalic and atraumatic.  Eyes:     General: No scleral icterus. Cardiovascular:     Rate and Rhythm: Normal rate and regular rhythm.     Heart sounds: No  murmur heard. Pulmonary:     Effort: Pulmonary effort is normal. No respiratory distress.     Breath sounds: No wheezing.  Abdominal:     General: There is no distension.     Palpations: Abdomen is soft.     Tenderness: There is no abdominal tenderness.  Musculoskeletal:        General: Normal range of motion.     Cervical back: Normal range of motion and neck supple.  Skin:    General: Skin is warm and dry.     Findings: No erythema.  Neurological:     Mental Status: She is alert and oriented to person, place, and time. Mental status is at baseline.     Motor: No abnormal muscle tone.  Psychiatric:        Mood and Affect: Mood and affect normal.   History of left mastectomy.  No palpable chest wall mass. Radiation-induced telangiectasias.    LABORATORY DATA:    Latest Ref Rng & Units 06/13/2023   11:39 AM 12/04/2022    9:12 AM 05/23/2022    9:11 AM  CBC  WBC 4.0 - 10.5 K/uL 10.7  8.6  8.3   Hemoglobin 12.0 - 15.0 g/dL 16.1  09.6  04.5   Hematocrit 36.0 - 46.0 % 37.7  38.1  37.6   Platelets 150 - 400 K/uL 378  366  322       Latest Ref Rng & Units 06/13/2023   11:39 AM 12/04/2022    9:12 AM 05/23/2022    9:11 AM  CMP  Glucose 70 - 99 mg/dL 409  811  914   BUN 8 - 23 mg/dL 29  26  24    Creatinine 0.44 - 1.00 mg/dL 7.82  0.84  0.74   Sodium 135 - 145 mmol/L 131  132  136   Potassium 3.5 - 5.1 mmol/L 4.4  3.8  4.0   Chloride 98 - 111 mmol/L 98  95  102   CO2 22 - 32 mmol/L 23  25  25    Calcium 8.9 - 10.3 mg/dL 9.7  9.7  96.0   Total Protein 6.5 - 8.1 g/dL 7.1  7.4  7.6   Total Bilirubin 0.3 - 1.2 mg/dL 0.4  0.4  0.5   Alkaline Phos 38 - 126 U/L 74  82  67   AST 15 - 41 U/L 17  22  19    ALT 0 - 44 U/L 12  17  15     RADIOGRAPHIC STUDIES: I have personally reviewed the radiological images as listed and agreed with the findings in the report. MM 3D SCREENING MAMMOGRAM UNILATERAL RIGHT BREAST  Result Date: 05/18/2023 CLINICAL DATA:  Screening. EXAM: DIGITAL SCREENING  UNILATERAL RIGHT MAMMOGRAM WITH CAD AND TOMOSYNTHESIS TECHNIQUE: Right screening digital craniocaudal and mediolateral oblique mammograms were obtained. Right screening digital breast tomosynthesis was performed. The images were evaluated with computer-aided detection. COMPARISON:  Previous exam(s). ACR Breast Density Category b: There are scattered areas of fibroglandular density. FINDINGS: There are no findings suspicious for malignancy. IMPRESSION: No mammographic evidence of malignancy. A result letter of this screening mammogram will be mailed directly to the patient. RECOMMENDATION: Screening mammogram in one year. (Code:SM-B-01Y) BI-RADS CATEGORY  1: Negative. Electronically Signed   By: Sherian Rein M.D.   On: 05/18/2023 13:10   .

## 2023-06-18 NOTE — Assessment & Plan Note (Addendum)
#   cT3cN1cMo 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 S/p left modified radical mastectomy with ALND, s/p radiation.  ypT2N1a M0, declined adjuvant clinical trial Pabociclib + letrozole, currently on adjuvant endocrinology treatment with AI  Clinically she is doing well.  No evidence of disease recurrence.  Labs reviewed and discussed with patient., tumor marker CA15-3 is elevated-no CT evidence of recurrence.  Tumor marker has normalized.. .  Continue letrozole 2.5mg  daily. Refills sent.  Recommend to extend endocrine therapy beyond 5 years. Continue annual right breast mammogram -next due August 2025

## 2023-12-19 ENCOUNTER — Other Ambulatory Visit: Payer: Medicare HMO

## 2023-12-21 ENCOUNTER — Inpatient Hospital Stay: Payer: Medicare HMO | Attending: Oncology

## 2023-12-21 DIAGNOSIS — C50512 Malignant neoplasm of lower-outer quadrant of left female breast: Secondary | ICD-10-CM | POA: Diagnosis present

## 2023-12-21 DIAGNOSIS — Z79899 Other long term (current) drug therapy: Secondary | ICD-10-CM | POA: Diagnosis not present

## 2023-12-21 DIAGNOSIS — R7989 Other specified abnormal findings of blood chemistry: Secondary | ICD-10-CM | POA: Diagnosis not present

## 2023-12-21 DIAGNOSIS — Z9221 Personal history of antineoplastic chemotherapy: Secondary | ICD-10-CM | POA: Diagnosis not present

## 2023-12-21 DIAGNOSIS — Z1721 Progesterone receptor positive status: Secondary | ICD-10-CM | POA: Insufficient documentation

## 2023-12-21 DIAGNOSIS — Z1732 Human epidermal growth factor receptor 2 negative status: Secondary | ICD-10-CM | POA: Insufficient documentation

## 2023-12-21 DIAGNOSIS — M8580 Other specified disorders of bone density and structure, unspecified site: Secondary | ICD-10-CM | POA: Diagnosis present

## 2023-12-21 DIAGNOSIS — Z79811 Long term (current) use of aromatase inhibitors: Secondary | ICD-10-CM | POA: Insufficient documentation

## 2023-12-21 DIAGNOSIS — Z923 Personal history of irradiation: Secondary | ICD-10-CM | POA: Diagnosis not present

## 2023-12-21 DIAGNOSIS — Z8542 Personal history of malignant neoplasm of other parts of uterus: Secondary | ICD-10-CM | POA: Diagnosis not present

## 2023-12-21 DIAGNOSIS — Z170 Estrogen receptor positive status [ER+]: Secondary | ICD-10-CM | POA: Diagnosis not present

## 2023-12-21 LAB — CMP (CANCER CENTER ONLY)
ALT: 20 U/L (ref 0–44)
AST: 22 U/L (ref 15–41)
Albumin: 3.8 g/dL (ref 3.5–5.0)
Alkaline Phosphatase: 59 U/L (ref 38–126)
Anion gap: 12 (ref 5–15)
BUN: 34 mg/dL — ABNORMAL HIGH (ref 8–23)
CO2: 23 mmol/L (ref 22–32)
Calcium: 10.6 mg/dL — ABNORMAL HIGH (ref 8.9–10.3)
Chloride: 97 mmol/L — ABNORMAL LOW (ref 98–111)
Creatinine: 1.1 mg/dL — ABNORMAL HIGH (ref 0.44–1.00)
GFR, Estimated: 54 mL/min — ABNORMAL LOW (ref >60)
Glucose, Bld: 151 mg/dL — ABNORMAL HIGH (ref 70–99)
Potassium: 4 mmol/L (ref 3.5–5.1)
Sodium: 132 mmol/L — ABNORMAL LOW (ref 135–145)
Total Bilirubin: 0.6 mg/dL (ref 0.0–1.2)
Total Protein: 7.1 g/dL (ref 6.5–8.1)

## 2023-12-21 LAB — CBC WITH DIFFERENTIAL (CANCER CENTER ONLY)
Abs Immature Granulocytes: 0.11 10*3/uL — ABNORMAL HIGH (ref 0.00–0.07)
Basophils Absolute: 0.1 10*3/uL (ref 0.0–0.1)
Basophils Relative: 1 %
Eosinophils Absolute: 0.2 10*3/uL (ref 0.0–0.5)
Eosinophils Relative: 2 %
HCT: 34.9 % — ABNORMAL LOW (ref 36.0–46.0)
Hemoglobin: 11.8 g/dL — ABNORMAL LOW (ref 12.0–15.0)
Immature Granulocytes: 1 %
Lymphocytes Relative: 16 %
Lymphs Abs: 1.7 10*3/uL (ref 0.7–4.0)
MCH: 29 pg (ref 26.0–34.0)
MCHC: 33.8 g/dL (ref 30.0–36.0)
MCV: 85.7 fL (ref 80.0–100.0)
Monocytes Absolute: 0.8 10*3/uL (ref 0.1–1.0)
Monocytes Relative: 7 %
Neutro Abs: 7.9 10*3/uL — ABNORMAL HIGH (ref 1.7–7.7)
Neutrophils Relative %: 73 %
Platelet Count: 400 10*3/uL (ref 150–400)
RBC: 4.07 MIL/uL (ref 3.87–5.11)
RDW: 13.2 % (ref 11.5–15.5)
WBC Count: 10.8 10*3/uL — ABNORMAL HIGH (ref 4.0–10.5)
nRBC: 0 % (ref 0.0–0.2)

## 2023-12-22 LAB — CANCER ANTIGEN 15-3: CA 15-3: 23.9 U/mL (ref 0.0–25.0)

## 2023-12-22 LAB — CANCER ANTIGEN 27.29: CA 27.29: 22.8 U/mL (ref 0.0–38.6)

## 2023-12-24 ENCOUNTER — Encounter: Payer: Self-pay | Admitting: Oncology

## 2023-12-24 ENCOUNTER — Inpatient Hospital Stay: Payer: Medicare HMO

## 2023-12-24 ENCOUNTER — Inpatient Hospital Stay: Payer: Medicare HMO | Admitting: Oncology

## 2023-12-24 VITALS — BP 119/74 | HR 99 | Temp 98.7°F | Resp 18 | Wt 195.5 lb

## 2023-12-24 DIAGNOSIS — R7989 Other specified abnormal findings of blood chemistry: Secondary | ICD-10-CM

## 2023-12-24 DIAGNOSIS — C50512 Malignant neoplasm of lower-outer quadrant of left female breast: Secondary | ICD-10-CM | POA: Diagnosis not present

## 2023-12-24 DIAGNOSIS — Z170 Estrogen receptor positive status [ER+]: Secondary | ICD-10-CM | POA: Diagnosis not present

## 2023-12-24 DIAGNOSIS — M8580 Other specified disorders of bone density and structure, unspecified site: Secondary | ICD-10-CM | POA: Diagnosis not present

## 2023-12-24 DIAGNOSIS — M8589 Other specified disorders of bone density and structure, multiple sites: Secondary | ICD-10-CM

## 2023-12-24 DIAGNOSIS — E860 Dehydration: Secondary | ICD-10-CM

## 2023-12-24 MED ORDER — ZOLEDRONIC ACID 4 MG/5ML IV CONC
3.3000 mg | Freq: Once | INTRAVENOUS | Status: AC
  Administered 2023-12-24: 3.3 mg via INTRAVENOUS
  Filled 2023-12-24: qty 4.13

## 2023-12-24 MED ORDER — LETROZOLE 2.5 MG PO TABS: 2.5000 mg | ORAL_TABLET | Freq: Every day | ORAL | 1 refills | Status: DC

## 2023-12-24 NOTE — Progress Notes (Signed)
 Hematology/Oncology Progress note [phone] [phone]) [phone]     Reason for visit Follow up for management of Stage IIA left breast cancer.   ASSESSMENT & PLAN:   Cancer Staging  Breast cancer [facility]) Staging form: Breast, AJCC 8th Edition - Pathologic stage from 11/09/2017: No Stage Recommended (ypT2, pN1a, cM0, G2, ER+, PR+, HER2-) - Signed by Rickard Patience, MD on 11/09/2017  Malignant neoplasm of lower-outer quadrant of left breast of female, estrogen receptor positive (HCC) Staging form: Breast, AJCC 8th Edition - Clinical stage from 04/25/2017: Stage IIA (cT3(m), cN1(f), cM0, G2, ER+, PR+, HER2: Equivocal) - Signed by Rickard Patience, MD on 05/23/2017   Malignant neoplasm of lower-outer quadrant of left breast of female, estrogen receptor positive (HCC) # cT3cN1cMo 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 S/p left modified radical mastectomy with ALND, s/p radiation.  ypT2N1a M0, declined adjuvant clinical trial Pabociclib + letrozole, currently on adjuvant endocrinology treatment with AI  Clinically she is doing well.  No evidence of disease recurrence.  Patient is on extended endocrine therapy beyond 5 years. Labs reviewed and discussed with patient. Continue letrozole 2.5mg  daily. Refills sent. Continue annual right breast mammogram -next due August 2025    Osteopenia bone density 04/18/22 shows slight progression of osteopenia Proceed with zometa today. Continue calcium and vitamin D supplementation.   Elevated serum creatinine Likely due to dehydration.  Hypercalcemia, also likely due to dehydration. Encourage oral hydration   Orders Placed This Encounter  Procedures   MM 3D SCREENING MAMMOGRAM UNILATERAL RIGHT BREAST    Standing Status:   Future    Expected Date:   05/21/2024    Expiration Date:   12/23/2024    Reason for Exam (SYMPTOM  OR  DIAGNOSIS REQUIRED):   Breast cancer    Preferred imaging location?:   [facility]   DG Bone Density    Standing Status:   Future    Expected Date:   06/11/2024    Expiration Date:   12/23/2024    Reason for Exam (SYMPTOM  OR DIAGNOSIS REQUIRED):   Breast cancer    Preferred imaging location?:   [facility]   CBC with Differential (Cancer Center Only)    Standing Status:   Future    Expected Date:   06/25/2024    Expiration Date:   12/23/2024   CMP (Cancer Center only)    Standing Status:   Future    Expected Date:   06/25/2024    Expiration Date:   12/23/2024   Cancer antigen 27.29    Standing Status:   Future    Expected Date:   06/25/2024    Expiration Date:   12/23/2024   Cancer antigen 15-3    Standing Status:   Future    Expected Date:   06/25/2024    Expiration Date:   12/23/2024   Follow up 6months Lab prior to MD + Zometa All questions were answered. The patient knows to call the clinic with any problems, questions or concerns.  Rickard Patience, MD, PhD [facility] Hematology Oncology 12/24/2023       Pertinent Oncology History Dominique Guzman 73 y.o. female presents for follow up of breast cancer. . Patient had a history a left breast mass biopsy followed by breast excisonal biopsy in 2015 with benign pathology.  She also has a history of Uterine cancer which was treated with RT with Dr.Chrystal.  She underwent routine screening mammogram on 04/17/2017 which recommended a possible mass on the left breast. Left diagnostic mammogram showed a highly suspicious left breast mass at 4 o'clock, 7 cm from the nipple and 1 o'clock,,  About 3 cm from the nipple. Enlarged left axillary lymph node suspicious for metastatic disease.  1 US breast confirmed a left breast mass of 2.1cm, 4 o'clock, and 1.1 cm mass at 1 o'clock.The masses at 1 and 4 o'clock are separated by approximately 4 cm sonographically 2 Biopsy of the two mass and axillary lymph node revealed invasive carcinoma,  grade 2. Both are ER,PR positive. HER2 equivocal, FISH pending. 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  3 05/14/2017 MRI breast showed Two sites of biopsy proven malignancy seen in the left breast, which span up to 11.3 cm. There are intervening areas of non mass enhancement and a small suspicious enhancing mass. 4 Case was discussed on breast tumor board on 05/21/2017 and the panel agree with neoadjuvant ddAC to T followed by surgery. Clinically cT3N1M0, Stage IIA  # Genetic testing Testing did not reveal a pathogenic mutation in any of the genes analyzed.  A copy of the genetic test report will be scanned into Epic under the Media tab.The genes analyzed were the 83 genes on [facility]'s Multi-Cancer panel (ALK, APC, ATM, AXIN2, BAP1, BARD1, BLM, BMPR1A, BRCA1, BRCA2, BRIP1, CASR, CDC73, CDH1, CDK4, CDKN1B, CDKN1C, CDKN2A, CEBPA, CHEK2, CTNNA1, DICER1, DIS3L2, EGFR, EPCAM, FH, FLCN, GATA2, GPC3, GREM1, HOXB13, HRAS, KIT, MAX, MEN1, MET, MITF, MLH1, MSH2, MSH3, MSH6, MUTYH, NBN, NF1, NF2, NTHL1, PALB2, PDGFRA, PHOX2B, PMS2, POLD1, POLE, POT1, PRKAR1A, PTCH1, PTEN, RAD50, RAD51C, RAD51D, RB1, RECQL4, RET, RUNX1, SDHA, SDHAF2, SDHB, SDHC, SDHD, SMAD4, SMARCA4, SMARCB1, SMARCE1, STK11, SUFU, TERC, TERT, TMEM127, TP53, TSC1, TSC2, VHL, WRN, WT1).   #Cancer treatment  Neoadjuvant chemotherapy 05/23/2017- 10/03/2017 patient completed s/p 4 cycles ddAC and weekly taxol x 12, on 10/26/2017 patient underwent that surgery.  Left axillary sentinel lymph node was positive, so Dr. Katrinka Blazing proceeded with left mastectomy and left axillary lymph node dissection. Pathology showed ypT2(m)  pN1a, ER/PR positive, HER2 FISH negative, grade 2, negative margin, invasive mammary carcinoma.  S/p adjuvant RT.  Declined NATALEE Trial Clinical trial for pabociclib and letrozole.   # May 2019 Started on Letrozole  # Mediport removed 09/18/2018  History of uterine cancer.  INTERVAL HISTORY Patient presents for follow up  for history of breast cancer ypT2 yp N1, s/p neoadjuvant chemotherapy and surgery ad adjuvant RT.  Patient presents for 78-month follow-up. She feels well.  Patient has no new breast concerns.  She is accompanied by her husband. Tolerates letrozole well.   Review of Systems  Constitutional:  Negative for appetite change, chills, fatigue and fever.  HENT:   Negative for hearing loss and voice change.   Eyes:  Negative for eye problems.  Respiratory:  Negative for chest tightness and cough.   Cardiovascular:  Negative for chest pain.  Gastrointestinal:  Negative for abdominal distention, abdominal pain and blood in stool.  Endocrine: Positive for hot flashes.  Genitourinary:  Negative for difficulty urinating and frequency.   Musculoskeletal:  Positive for arthralgias.  Skin:  Negative for itching and rash.  Neurological:  Negative for extremity weakness.  Hematological:  Negative for adenopathy.  Psychiatric/Behavioral:  Negative for confusion.     MEDICAL HISTORY: Past Medical History:  Diagnosis Date   Anemia    Arthritis    HIP-LEFT   Benign neoplasm of colon  Breast cancer (HCC) 2018   left breast   Cancer (HCC)    Diabetes mellitus without complication (HCC)    Genetic testing 01/10/2018   Multi-Cancer panel (83 genes) @ [facility] - No pathogenic mutations detected   H/O osteopenia    History of hiatal hernia    Hypertension    Personal history of chemotherapy    Personal history of radiation therapy    Sleep apnea    USE CPAP   Trigger finger, acquired    Uterine cancer (HCC)    Uterine cancer (HCC)     SURGICAL HISTORY: Past Surgical History:  Procedure Laterality Date   ABDOMINAL HYSTERECTOMY     BREAST BIOPSY Left 04/2014   negative   BREAST EXCISIONAL BIOPSY Left 2015   CESAREAN SECTION     X2   COLONOSCOPY WITH PROPOFOL N/A 04/15/2015   Procedure: COLONOSCOPY WITH PROPOFOL;  Surgeon: Wallace Cullens, MD;  Location: [facility] ENDOSCOPY;  Service: Gastroenterology;   Laterality: N/A;   COLONOSCOPY WITH PROPOFOL N/A 08/23/2020   Procedure: COLONOSCOPY WITH PROPOFOL;  Surgeon: Toledo, Boykin Nearing, MD;  Location: [facility] ENDOSCOPY;  Service: Gastroenterology;  Laterality: N/A;   HERNIA REPAIR     MASTECTOMY Left 10/2017   MASTECTOMY MODIFIED RADICAL Left 10/26/2017   Procedure: MASTECTOMY MODIFIED RADICAL;  Surgeon: Nadeen Landau, MD;  Location: [facility] ORS;  Service: General;  Laterality: Left;   MASTECTOMY W/ SENTINEL NODE BIOPSY Left 10/26/2017   Procedure: MASTECTOMY WITH SENTINEL LYMPH NODE BIOPSY;  Surgeon: Nadeen Landau, MD;  Location: [facility] ORS;  Service: General;  Laterality: Left;   PORTACATH PLACEMENT Right 05/22/2017   Procedure: INSERTION PORT-A-CATH;  Surgeon: Nadeen Landau, MD;  Location: [facility] ORS;  Service: General;  Laterality: Right;    SOCIAL HISTORY: Social History   Socioeconomic History   Marital status: Married    Spouse name: Not on file   Number of children: Not on file   Years of education: Not on file   Highest education level: Not on file  Occupational History   Not on file  Tobacco Use   Smoking status: Never   Smokeless tobacco: Never  Vaping Use   Vaping status: Never Used  Substance and Sexual Activity   Alcohol use: No   Drug use: No   Sexual activity: Not on file  Other Topics Concern   Not on file  Social History Narrative   Not on file   Social Drivers of Health   Financial Resource Strain: Low Risk  (09/26/2023)   Received from [facility]   Overall Financial Resource Strain (CARDIA)    Difficulty of Paying Living Expenses: Not hard at all  Food Insecurity: No Food Insecurity (09/26/2023)   Received from [facility]   Hunger Vital Sign    Worried About Running Out of Food in the Last Year: Never true    Ran Out of Food in the Last Year: Never true  Transportation Needs: No Transportation Needs (09/26/2023)   Received from [facility] - Transportation    In the past 12 months, has lack of transportation kept you from medical appointments or from getting medications?: No    Lack of Transportation (Non-Medical): No  Physical Activity: Not on file  Stress: Not on file  Social Connections: Not on file  Intimate Partner Violence: Not on file    FAMILY HISTORY Family History  Problem Relation Age of Onset   Diabetes Mother  Diabetes Father    Melanoma Father 8       on finger   Diabetes Sister    Pancreatic cancer Paternal Grandfather        deceased 26s   Breast cancer Other 75       paternal grandmother's mother    ALLERGIES:  is allergic to crestor [rosuvastatin].  MEDICATIONS:  Current Outpatient Medications  Medication Sig Dispense Refill   Calcium Carb-Cholecalciferol (CALCIUM 600-D PO) Take 1 tablet by mouth daily.     Cinnamon 500 MG capsule Take 1,000 mg by mouth daily.     glipiZIDE (GLUCOTROL XL) 10 MG 24 hr tablet Take 1 tablet by mouth daily.     Insulin Glargine (BASAGLAR KWIKPEN) 100 UNIT/ML Inject 10 Units into the skin at bedtime.     lisinopril-hydrochlorothiazide (PRINZIDE,ZESTORETIC) 20-12.5 MG per tablet Take 1 tablet by mouth daily.     metFORMIN (GLUCOPHAGE) 500 MG tablet Take 1,000 mg by mouth 2 (two) times daily.      Multiple Vitamin (MULTIVITAMIN) tablet Take 1 tablet by mouth daily.     pioglitazone (ACTOS) 30 MG tablet Take 30 mg by mouth daily.     trospium (SANCTURA) 20 MG tablet Take 20 mg by mouth 2 (two) times daily.     diphenhydrAMINE (BENADRYL) 25 MG tablet Take 25 mg by mouth every 6 (six) hours as needed for allergies.  (Patient not taking: Reported on 12/24/2023)     letrozole (FEMARA) 2.5 MG tablet Take 1 tablet (2.5 mg total) by mouth daily. 90 tablet 1   No current facility-administered medications for this visit.    PHYSICAL EXAMINATION:  ECOG PERFORMANCE STATUS: 1 - Symptomatic but completely ambulatory  Vitals:   12/24/23 1323  BP: 119/74   Pulse: 99  Resp: 18  Temp: 98.7 F (37.1 C)  SpO2: 98%    Filed Weights   12/24/23 1323  Weight: 195 lb 8 oz (88.7 kg)     Physical Exam Constitutional:      General: She is not in acute distress.    Appearance: She is obese. She is not diaphoretic.     Comments: Patient sits in wheelchair.     HENT:     Head: Normocephalic and atraumatic.  Eyes:     General: No scleral icterus. Cardiovascular:     Rate and Rhythm: Normal rate and regular rhythm.     Heart sounds: No murmur heard. Pulmonary:     Effort: Pulmonary effort is normal. No respiratory distress.     Breath sounds: No wheezing.  Abdominal:     General: There is no distension.     Palpations: Abdomen is soft.  Musculoskeletal:        General: Normal range of motion.     Cervical back: Normal range of motion and neck supple.  Skin:    General: Skin is warm and dry.     Findings: No erythema.  Neurological:     Mental Status: She is alert and oriented to person, place, and time. Mental status is at baseline.     Motor: No abnormal muscle tone.  Psychiatric:        Mood and Affect: Mood and affect normal.   History of left mastectomy.  No palpable chest wall mass. Radiation-induced telangiectasias.    LABORATORY DATA:    Latest Ref Rng & Units 12/21/2023   11:43 AM 06/13/2023   11:39 AM 12/04/2022    9:12 AM  CBC  WBC 4.0 - 10.5  K/uL 10.8  10.7  8.6   Hemoglobin 12.0 - 15.0 g/dL 65.7  84.6  96.2   Hematocrit 36.0 - 46.0 % 34.9  37.7  38.1   Platelets 150 - 400 K/uL 400  378  366       Latest Ref Rng & Units 12/21/2023   11:43 AM 06/13/2023   11:39 AM 12/04/2022    9:12 AM  CMP  Glucose 70 - 99 mg/dL 952  841  324   BUN 8 - 23 mg/dL 34  29  26   Creatinine 0.44 - 1.00 mg/dL 4.01  0.27  2.53   Sodium 135 - 145 mmol/L 132  131  132   Potassium 3.5 - 5.1 mmol/L 4.0  4.4  3.8   Chloride 98 - 111 mmol/L 97  98  95   CO2 22 - 32 mmol/L 23  23  25    Calcium 8.9 - 10.3 mg/dL 66.4  9.7  9.7   Total Protein  6.5 - 8.1 g/dL 7.1  7.1  7.4   Total Bilirubin 0.0 - 1.2 mg/dL 0.6  0.4  0.4   Alkaline Phos 38 - 126 U/L 59  74  82   AST 15 - 41 U/L 22  17  22    ALT 0 - 44 U/L 20  12  17     RADIOGRAPHIC STUDIES: I have personally reviewed the radiological images as listed and agreed with the findings in the report. No results found. [name]

## 2023-12-24 NOTE — Assessment & Plan Note (Signed)
 Likely due to dehydration.  Hypercalcemia, also likely due to dehydration. Encourage oral hydration

## 2023-12-24 NOTE — Assessment & Plan Note (Signed)
bone density 04/18/22 shows slight progression of osteopenia Proceed with zometa today. Continue calcium and vitamin D supplementation.

## 2023-12-24 NOTE — Patient Instructions (Signed)

## 2023-12-24 NOTE — Assessment & Plan Note (Addendum)
#   cT3cN1cMo 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 S/p left modified radical mastectomy with ALND, s/p radiation.  ypT2N1a M0, declined adjuvant clinical trial Pabociclib + letrozole, currently on adjuvant endocrinology treatment with AI  Clinically she is doing well.  No evidence of disease recurrence.  Patient is on extended endocrine therapy beyond 5 years. Labs reviewed and discussed with patient. Continue letrozole 2.5mg  daily. Refills sent. Continue annual right breast mammogram -next due August 2025

## 2024-02-06 ENCOUNTER — Other Ambulatory Visit: Payer: Self-pay | Admitting: Orthopedic Surgery

## 2024-02-14 ENCOUNTER — Other Ambulatory Visit: Payer: Self-pay

## 2024-02-14 ENCOUNTER — Encounter
Admission: RE | Admit: 2024-02-14 | Discharge: 2024-02-14 | Disposition: A | Discharge status: Home / Self Care | Source: Ambulatory Visit | Attending: Orthopedic Surgery | Admitting: Orthopedic Surgery

## 2024-02-14 VITALS — BP 115/61 | HR 94 | Resp 16 | Ht <= 58 in | Wt 183.1 lb

## 2024-02-14 DIAGNOSIS — Z01812 Encounter for preprocedural laboratory examination: Secondary | ICD-10-CM

## 2024-02-14 DIAGNOSIS — R8290 Unspecified abnormal findings in urine: Secondary | ICD-10-CM | POA: Diagnosis not present

## 2024-02-14 DIAGNOSIS — R8271 Bacteriuria: Secondary | ICD-10-CM

## 2024-02-14 DIAGNOSIS — Z01818 Encounter for other preprocedural examination: Secondary | ICD-10-CM | POA: Diagnosis present

## 2024-02-14 DIAGNOSIS — R8281 Pyuria: Secondary | ICD-10-CM | POA: Insufficient documentation

## 2024-02-14 HISTORY — DX: Type 2 diabetes mellitus without complications: E11.9

## 2024-02-14 HISTORY — DX: Gastro-esophageal reflux disease without esophagitis: K21.9

## 2024-02-14 HISTORY — DX: Dizziness and giddiness: R42

## 2024-02-14 HISTORY — DX: Overactive bladder: N32.81

## 2024-02-14 LAB — COMPREHENSIVE METABOLIC PANEL WITH GFR
ALT: 16 U/L (ref 0–44)
AST: 20 U/L (ref 15–41)
Albumin: 3.9 g/dL (ref 3.5–5.0)
Alkaline Phosphatase: 62 U/L (ref 38–126)
Anion gap: 11 (ref 5–15)
BUN: 25 mg/dL — ABNORMAL HIGH (ref 8–23)
CO2: 24 mmol/L (ref 22–32)
Calcium: 10.2 mg/dL (ref 8.9–10.3)
Chloride: 100 mmol/L (ref 98–111)
Creatinine, Ser: 1.1 mg/dL — ABNORMAL HIGH (ref 0.44–1.00)
GFR, Estimated: 54 mL/min — ABNORMAL LOW (ref >60)
Glucose, Bld: 121 mg/dL — ABNORMAL HIGH (ref 70–99)
Potassium: 3.6 mmol/L (ref 3.5–5.1)
Sodium: 135 mmol/L (ref 135–145)
Total Bilirubin: 0.7 mg/dL (ref 0.0–1.2)
Total Protein: 7.7 g/dL (ref 6.5–8.1)

## 2024-02-14 LAB — URINALYSIS, ROUTINE W REFLEX MICROSCOPIC
Bilirubin Urine: NEGATIVE
Glucose, UA: NEGATIVE mg/dL
Hgb urine dipstick: NEGATIVE
Ketones, ur: NEGATIVE mg/dL
Nitrite: NEGATIVE
Protein, ur: NEGATIVE mg/dL
Specific Gravity, Urine: 1.016 (ref 1.005–1.030)
pH: 5 (ref 5.0–8.0)

## 2024-02-14 LAB — SURGICAL PCR SCREEN
MRSA, PCR: NEGATIVE
Staphylococcus aureus: NEGATIVE

## 2024-02-14 NOTE — Patient Instructions (Addendum)
 Your procedure is scheduled on:02-21-24 Thursday Report to the Registration Desk on the 1st floor of the [facility].Then proceed to the 2nd floor Surgery Desk To find out your arrival time, please call [phone] between 1PM - 3PM on:02-20-24 Wednesday If your arrival time is 6:00 am, do not arrive before that time as the [facility] entrance doors do not open until 6:00 am.  REMEMBER: Instructions that are not followed completely may result in serious medical risk, up to and including death; or upon the discretion of your surgeon and anesthesiologist your surgery may need to be rescheduled.  Do not eat food after midnight the night before surgery.  No gum chewing or hard candies.  You may however, drink Water up to 2 hours before you are scheduled to arrive for your surgery. Do not drink anything within 2 hours of your scheduled arrival time.  In addition, your doctor has ordered for you to drink the provided:  Gatorade G2 Drinking this carbohydrate drink up to two hours before surgery helps to reduce insulin  resistance and improve patient outcomes. Please complete drinking 2 hours before scheduled arrival time.  One week prior to surgery:Stop NOW (02-14-24) Stop Anti-inflammatories (NSAIDS) such as meloxicam (MOBIC), Advil, Aleve, Ibuprofen, Motrin, Naproxen, Naprosyn and Aspirin based products such as Excedrin, Goody's Powder, BC Powder. Stop ANY OVER THE COUNTER supplements until after surgery (Vitamin D , Multivitamin)  You may however, continue to take Tylenol  if needed for pain up until the day of surgery.  Stop metFORMIN  (GLUCOPHAGE ) 2 days prior to surgery-Last dose will be on 02-18-24 Monday  Continue taking all of your other prescription medications up until the day of surgery.  ON THE DAY OF SURGERY ONLY TAKE THESE MEDICATIONS WITH SIPS OF WATER: -letrozole  (FEMARA )   Take half of your Basaglar Insulin  (6 units) the night prior to surgery-Do NOT take any Insulin  the morning  of surgery  No Alcohol for 24 hours before or after surgery.  No Smoking including e-cigarettes for 24 hours before surgery.  No chewable tobacco products for at least 6 hours before surgery.  No nicotine patches on the day of surgery.  Do not use any "recreational" drugs for at least a week (preferably 2 weeks) before your surgery.  Please be advised that the combination of cocaine and anesthesia may have negative outcomes, up to and including death. If you test positive for cocaine, your surgery will be cancelled.  On the morning of surgery brush your teeth with toothpaste and water, you may rinse your mouth with mouthwash if you wish. Do not swallow any toothpaste or mouthwash.  Use CHG Soap as directed on instruction sheet.  Do not wear jewelry, make-up, hairpins, clips or nail polish.  For welded (permanent) jewelry: bracelets, anklets, waist bands, etc.  Please have this removed prior to surgery.  If it is not removed, there is a chance that hospital personnel will need to cut it off on the day of surgery.  Do not wear lotions, powders, or perfumes.   Do not shave body hair from the neck down 48 hours before surgery.  Contact lenses, hearing aids and dentures may not be worn into surgery.  Do not bring valuables to the hospital. [facility] is not responsible for any missing/lost belongings or valuables.   Notify your doctor if there is any change in your medical condition (cold, fever, infection).  Wear comfortable clothing (specific to your surgery type) to the hospital.  After surgery, you can help prevent  lung complications by doing breathing exercises.  Take deep breaths and cough every 1-2 hours. Your doctor may order a device called an Incentive Spirometer to help you take deep breaths. When coughing or sneezing, hold a pillow firmly against your incision with both hands. This is called "splinting." Doing this helps protect your incision. It also decreases belly  discomfort.  If you are being admitted to the hospital overnight, leave your suitcase in the car. After surgery it may be brought to your room.  In case of increased patient census, it may be necessary for you, the patient, to continue your postoperative care in the Same Day Surgery department.  If you are being discharged the day of surgery, you will not be allowed to drive home. You will need a responsible individual to drive you home and stay with you for 24 hours after surgery.   If you are taking public transportation, you will need to have a responsible individual with you.  Please call the Pre-admissions Testing Dept. at [phone] if you have any questions about these instructions.  Surgery Visitation Policy:  Patients having surgery or a procedure may have two visitors.  Children under the age of 48 must have an adult with them who is not the patient.  Inpatient Visitation:    Visiting hours are 7 a.m. to 8 p.m. Up to four visitors are allowed at one time in a patient room. The visitors may rotate out with other people during the day.  One visitor age 59 or older may stay with the patient overnight and must be in the room by 8 p.m.    Pre-operative 5 CHG Bath Instructions   You can play a key role in reducing the risk of infection after surgery. Your skin needs to be as free of germs as possible. You can reduce the number of germs on your skin by washing with CHG (chlorhexidine  gluconate) soap before surgery. CHG is an antiseptic soap that kills germs and continues to kill germs even after washing.   DO NOT use if you have an allergy to chlorhexidine /CHG or antibacterial soaps. If your skin becomes reddened or irritated, stop using the CHG and notify one of our RNs at [phone].   Please shower with the CHG soap starting 4 days before surgery using the following schedule:     Please keep in mind the following:  DO NOT shave, including legs and underarms, starting  the day of your first shower.   You may shave your face at any point before/day of surgery.  Place clean sheets on your bed the day you start using CHG soap. Use a clean washcloth (not used since being washed) for each shower. DO NOT sleep with pets once you start using the CHG.   CHG Shower Instructions:  If you choose to wash your hair and private area, wash first with your normal shampoo/soap.  After you use shampoo/soap, rinse your hair and body thoroughly to remove shampoo/soap residue.  Turn the water OFF and apply about 3 tablespoons (45 ml) of CHG soap to a CLEAN washcloth.  Apply CHG soap ONLY FROM YOUR NECK DOWN TO YOUR TOES (washing for 3-5 minutes)  DO NOT use CHG soap on face, private areas, open wounds, or sores.  Pay special attention to the area where your surgery is being performed.  If you are having back surgery, having someone wash your back for you may be helpful. Wait 2 minutes after CHG soap is applied, then  you may rinse off the CHG soap.  Pat dry with a clean towel  Put on clean clothes/pajamas   If you choose to wear lotion, please use ONLY the CHG-compatible lotions on the back of this paper.     Additional instructions for the day of surgery: DO NOT APPLY any lotions, deodorants, cologne, or perfumes.   Put on clean/comfortable clothes.  Brush your teeth.  Ask your nurse before applying any prescription medications to the skin.      CHG Compatible Lotions   Aveeno Moisturizing lotion  Cetaphil Moisturizing Cream  Cetaphil Moisturizing Lotion  Clairol Herbal Essence Moisturizing Lotion, Dry Skin  Clairol Herbal Essence Moisturizing Lotion, Extra Dry Skin  Clairol Herbal Essence Moisturizing Lotion, Normal Skin  Curel Age Defying Therapeutic Moisturizing Lotion with Alpha Hydroxy  Curel Extreme Care Body Lotion  Curel Soothing Hands Moisturizing Hand Lotion  Curel Therapeutic Moisturizing Cream, Fragrance-Free  Curel Therapeutic Moisturizing Lotion,  Fragrance-Free  Curel Therapeutic Moisturizing Lotion, Original Formula  Eucerin Daily Replenishing Lotion  Eucerin Dry Skin Therapy Plus Alpha Hydroxy Crme  Eucerin Dry Skin Therapy Plus Alpha Hydroxy Lotion  Eucerin Original Crme  Eucerin Original Lotion  Eucerin Plus Crme Eucerin Plus Lotion  Eucerin TriLipid Replenishing Lotion  Keri Anti-Bacterial Hand Lotion  Keri Deep Conditioning Original Lotion Dry Skin Formula Softly Scented  Keri Deep Conditioning Original Lotion, Fragrance Free Sensitive Skin Formula  Keri Lotion Fast Absorbing Fragrance Free Sensitive Skin Formula  Keri Lotion Fast Absorbing Softly Scented Dry Skin Formula  Keri Original Lotion  Keri Skin Renewal Lotion Keri Silky Smooth Lotion  Keri Silky Smooth Sensitive Skin Lotion  Nivea Body Creamy Conditioning Oil  Nivea Body Extra Enriched Lotion  Nivea Body Original Lotion  Nivea Body Sheer Moisturizing Lotion Nivea Crme  Nivea Skin Firming Lotion  NutraDerm 30 Skin Lotion  NutraDerm Skin Lotion  NutraDerm Therapeutic Skin Cream  NutraDerm Therapeutic Skin Lotion  ProShield Protective Hand Cream  Provon moisturizing lotion  How to Use an Incentive Spirometer An incentive spirometer is a tool that measures how well you are filling your lungs with each breath. Learning to take long, deep breaths using this tool can help you keep your lungs clear and active. This may help to reverse or lessen your chance of developing breathing (pulmonary) problems, especially infection. You may be asked to use a spirometer: After a surgery. If you have a lung problem or a history of smoking. After a long period of time when you have been unable to move or be active. If the spirometer includes an indicator to show the highest number that you have reached, your health care provider or respiratory therapist will help you set a goal. Keep a log of your progress as told by your health care provider. What are the risks? Breathing  too quickly may cause dizziness or cause you to pass out. Take your time so you do not get dizzy or light-headed. If you are in pain, you may need to take pain medicine before doing incentive spirometry. It is harder to take a deep breath if you are having pain. How to use your incentive spirometer  Sit up on the edge of your bed or on a chair. Hold the incentive spirometer so that it is in an upright position. Before you use the spirometer, breathe out normally. Place the mouthpiece in your mouth. Make sure your lips are closed tightly around it. Breathe in slowly and as deeply as you can through your mouth, causing  the piston or the ball to rise toward the top of the chamber. Hold your breath for 3-5 seconds, or for as long as possible. If the spirometer includes a coach indicator, use this to guide you in breathing. Slow down your breathing if the indicator goes above the marked areas. Remove the mouthpiece from your mouth and breathe out normally. The piston or ball will return to the bottom of the chamber. Rest for a few seconds, then repeat the steps 10 or more times. Take your time and take a few normal breaths between deep breaths so that you do not get dizzy or light-headed. Do this every 1-2 hours when you are awake. If the spirometer includes a goal marker to show the highest number you have reached (best effort), use this as a goal to work toward during each repetition. After each set of 10 deep breaths, cough a few times. This will help to make sure that your lungs are clear. If you have an incision on your chest or abdomen from surgery, place a pillow or a rolled-up towel firmly against the incision when you cough. This can help to reduce pain while taking deep breaths and coughing. General tips When you are able to get out of bed: Walk around often. Continue to take deep breaths and cough in order to clear your lungs. Keep using the incentive spirometer until your health care  provider says it is okay to stop using it. If you have been in the hospital, you may be told to keep using the spirometer at home. Contact a health care provider if: You are having difficulty using the spirometer. You have trouble using the spirometer as often as instructed. Your pain medicine is not giving enough relief for you to use the spirometer as told. You have a fever. Get help right away if: You develop shortness of breath. You develop a cough with bloody mucus from the lungs. You have fluid or blood coming from an incision site after you cough. Summary An incentive spirometer is a tool that can help you learn to take long, deep breaths to keep your lungs clear and active. You may be asked to use a spirometer after a surgery, if you have a lung problem or a history of smoking, or if you have been inactive for a long period of time. Use your incentive spirometer as instructed every 1-2 hours while you are awake. If you have an incision on your chest or abdomen, place a pillow or a rolled-up towel firmly against your incision when you cough. This will help to reduce pain. Get help right away if you have shortness of breath, you cough up bloody mucus, or blood comes from your incision when you cough. This information is not intended to replace advice given to you by your health care provider. Make sure you discuss any questions you have with your health care provider. Document Revised: 08/03/2023 Document Reviewed: 08/03/2023 Elsevier Patient Education  2024 [employer]  Preoperative Educational Videos for Total Hip, Knee and Shoulder Replacements  To better prepare for surgery, please view our videos that explain the physical activity and discharge planning required to have the best surgical recovery at [facility].  [email or web address]  Questions? Call [phone] or email  [email or web address]

## 2024-02-15 LAB — URINE CULTURE: Culture: <10000 — AB

## 2024-02-21 ENCOUNTER — Other Ambulatory Visit: Payer: Self-pay

## 2024-02-21 ENCOUNTER — Ambulatory Visit: Admitting: Anesthesiology

## 2024-02-21 ENCOUNTER — Ambulatory Visit: Payer: Self-pay | Admitting: Urgent Care

## 2024-02-21 ENCOUNTER — Ambulatory Visit
Admission: RE | Admit: 2024-02-21 | Discharge: 2024-02-22 | Disposition: A | Discharge status: Home Health | Source: Ambulatory Visit | Attending: Orthopedic Surgery | Admitting: Orthopedic Surgery

## 2024-02-21 ENCOUNTER — Encounter
Admission: RE | Disposition: A | Discharge status: Home Health | Payer: Self-pay | Source: Ambulatory Visit | Attending: Orthopedic Surgery

## 2024-02-21 ENCOUNTER — Ambulatory Visit

## 2024-02-21 ENCOUNTER — Encounter: Payer: Self-pay | Admitting: Orthopedic Surgery

## 2024-02-21 DIAGNOSIS — M25761 Osteophyte, right knee: Secondary | ICD-10-CM | POA: Diagnosis not present

## 2024-02-21 DIAGNOSIS — E669 Obesity, unspecified: Secondary | ICD-10-CM | POA: Diagnosis not present

## 2024-02-21 DIAGNOSIS — I10 Essential (primary) hypertension: Secondary | ICD-10-CM | POA: Diagnosis not present

## 2024-02-21 DIAGNOSIS — M174 Other bilateral secondary osteoarthritis of knee: Secondary | ICD-10-CM | POA: Insufficient documentation

## 2024-02-21 DIAGNOSIS — Z7984 Long term (current) use of oral hypoglycemic drugs: Secondary | ICD-10-CM | POA: Insufficient documentation

## 2024-02-21 DIAGNOSIS — Z794 Long term (current) use of insulin: Secondary | ICD-10-CM | POA: Diagnosis not present

## 2024-02-21 DIAGNOSIS — Z6838 Body mass index (BMI) 38.0-38.9, adult: Secondary | ICD-10-CM | POA: Insufficient documentation

## 2024-02-21 DIAGNOSIS — E871 Hypo-osmolality and hyponatremia: Secondary | ICD-10-CM | POA: Diagnosis not present

## 2024-02-21 DIAGNOSIS — Z96651 Presence of right artificial knee joint: Secondary | ICD-10-CM

## 2024-02-21 DIAGNOSIS — E119 Type 2 diabetes mellitus without complications: Secondary | ICD-10-CM | POA: Diagnosis not present

## 2024-02-21 DIAGNOSIS — Z01818 Encounter for other preprocedural examination: Secondary | ICD-10-CM

## 2024-02-21 HISTORY — PX: TOTAL KNEE ARTHROPLASTY: SHX125

## 2024-02-21 LAB — GLUCOSE, CAPILLARY
Glucose-Capillary: 210 mg/dL — ABNORMAL HIGH (ref 70–99)
Glucose-Capillary: 140 mg/dL — ABNORMAL HIGH (ref 70–99)
Glucose-Capillary: 134 mg/dL — ABNORMAL HIGH (ref 70–99)
Glucose-Capillary: 267 mg/dL — ABNORMAL HIGH (ref 70–99)
Glucose-Capillary: 304 mg/dL — ABNORMAL HIGH (ref 70–99)

## 2024-02-21 SURGERY — ARTHROPLASTY, KNEE, TOTAL
Anesthesia: Spinal | Site: Knee | Laterality: Right

## 2024-02-21 MED ORDER — ACETAMINOPHEN 500 MG PO TABS
1000.0000 mg | ORAL_TABLET | Freq: Three times a day (TID) | ORAL | Status: DC
  Administered 2024-02-21 – 2024-02-22 (×2): 1000 mg via ORAL
  Filled 2024-02-21 (×2): qty 2

## 2024-02-21 MED ORDER — EPHEDRINE 5 MG/ML INJ
INTRAVENOUS | Status: AC
  Filled 2024-02-21: qty 5

## 2024-02-21 MED ORDER — CEFAZOLIN SODIUM-DEXTROSE 2-4 GM/100ML-% IV SOLN
INTRAVENOUS | Status: AC
  Filled 2024-02-21: qty 100

## 2024-02-21 MED ORDER — OXYCODONE HCL 5 MG PO TABS
ORAL_TABLET | ORAL | Status: AC
  Filled 2024-02-21: qty 1

## 2024-02-21 MED ORDER — OXYCODONE HCL 5 MG PO TABS
5.0000 mg | ORAL_TABLET | Freq: Once | ORAL | Status: AC | PRN
  Administered 2024-02-21: 5 mg via ORAL

## 2024-02-21 MED ORDER — SODIUM CHLORIDE (PF) 0.9 % IJ SOLN
INTRAMUSCULAR | Status: DC | PRN
  Administered 2024-02-21: 71 mL via INTRAMUSCULAR

## 2024-02-21 MED ORDER — SODIUM CHLORIDE (PF) 0.9 % IJ SOLN
INTRAMUSCULAR | Status: AC
  Filled 2024-02-21: qty 20

## 2024-02-21 MED ORDER — ACETAMINOPHEN 500 MG PO TABS: 1000.0000 mg | ORAL_TABLET | Freq: Three times a day (TID) | ORAL | 0 refills | Status: AC

## 2024-02-21 MED ORDER — PROPOFOL 10 MG/ML IV BOLUS
INTRAVENOUS | Status: AC
  Filled 2024-02-21: qty 20

## 2024-02-21 MED ORDER — PHENYLEPHRINE 80 MCG/ML (10ML) SYRINGE FOR IV PUSH (FOR BLOOD PRESSURE SUPPORT)
PREFILLED_SYRINGE | INTRAVENOUS | Status: DC | PRN
  Administered 2024-02-21: 80 ug via INTRAVENOUS

## 2024-02-21 MED ORDER — DEXAMETHASONE SODIUM PHOSPHATE 10 MG/ML IJ SOLN
INTRAMUSCULAR | Status: AC
  Filled 2024-02-21: qty 1

## 2024-02-21 MED ORDER — SODIUM CHLORIDE 0.9 % IR SOLN
Status: DC | PRN
Start: 2024-02-21 — End: 2024-02-21
  Administered 2024-02-21: 3000 mL

## 2024-02-21 MED ORDER — TRAMADOL HCL 50 MG PO TABS: 50.0000 mg | ORAL_TABLET | Freq: Four times a day (QID) | ORAL | 0 refills | Status: AC | PRN

## 2024-02-21 MED ORDER — ENOXAPARIN SODIUM 30 MG/0.3ML IJ SOSY
30.0000 mg | PREFILLED_SYRINGE | Freq: Two times a day (BID) | INTRAMUSCULAR | Status: DC
  Administered 2024-02-22: 30 mg via SUBCUTANEOUS
  Filled 2024-02-21: qty 0.3

## 2024-02-21 MED ORDER — MIDAZOLAM HCL 5 MG/5ML IJ SOLN
INTRAMUSCULAR | Status: DC | PRN
  Administered 2024-02-21: 2 mg via INTRAVENOUS

## 2024-02-21 MED ORDER — PHENYLEPHRINE HCL-NACL 20-0.9 MG/250ML-% IV SOLN
INTRAVENOUS | Status: DC | PRN
  Administered 2024-02-21: 30 ug/min via INTRAVENOUS

## 2024-02-21 MED ORDER — BUPIVACAINE HCL (PF) 0.5 % IJ SOLN
INTRAMUSCULAR | Status: AC
  Filled 2024-02-21: qty 10

## 2024-02-21 MED ORDER — PHENOL 1.4 % MT LIQD: 1.0000 | OROMUCOSAL | Status: DC | PRN

## 2024-02-21 MED ORDER — TRANEXAMIC ACID-NACL 1000-0.7 MG/100ML-% IV SOLN
1000.0000 mg | INTRAVENOUS | Status: AC
  Administered 2024-02-21 (×2): 1000 mg via INTRAVENOUS

## 2024-02-21 MED ORDER — PROPOFOL 1000 MG/100ML IV EMUL
INTRAVENOUS | Status: AC
  Filled 2024-02-21: qty 100

## 2024-02-21 MED ORDER — FENTANYL CITRATE (PF) 100 MCG/2ML IJ SOLN
25.0000 ug | INTRAMUSCULAR | Status: DC | PRN
  Administered 2024-02-21 (×2): 25 ug via INTRAVENOUS
  Administered 2024-02-21: 50 ug via INTRAVENOUS

## 2024-02-21 MED ORDER — MORPHINE SULFATE (PF) 2 MG/ML IV SOLN: 0.5000 mg | INTRAVENOUS | Status: DC | PRN

## 2024-02-21 MED ORDER — OXYCODONE HCL 5 MG/5ML PO SOLN: 5.0000 mg | Freq: Once | ORAL | Status: AC | PRN

## 2024-02-21 MED ORDER — OXYCODONE HCL 5 MG PO TABS: 2.5000 mg | ORAL_TABLET | Freq: Three times a day (TID) | ORAL | 0 refills | Status: AC | PRN

## 2024-02-21 MED ORDER — TRANEXAMIC ACID-NACL 1000-0.7 MG/100ML-% IV SOLN
INTRAVENOUS | Status: AC
  Filled 2024-02-21: qty 100

## 2024-02-21 MED ORDER — ONDANSETRON HCL 4 MG/2ML IJ SOLN: 4.0000 mg | Freq: Four times a day (QID) | INTRAMUSCULAR | Status: DC | PRN

## 2024-02-21 MED ORDER — INSULIN ASPART 100 UNIT/ML IJ SOLN
0.0000 [IU] | Freq: Every day | INTRAMUSCULAR | Status: DC
  Administered 2024-02-21: 4 [IU] via SUBCUTANEOUS
  Filled 2024-02-21: qty 1

## 2024-02-21 MED ORDER — INSULIN ASPART 100 UNIT/ML IJ SOLN
INTRAMUSCULAR | Status: AC
  Filled 2024-02-21: qty 1

## 2024-02-21 MED ORDER — KETOROLAC TROMETHAMINE 15 MG/ML IJ SOLN
7.5000 mg | Freq: Four times a day (QID) | INTRAMUSCULAR | Status: DC
  Administered 2024-02-21 – 2024-02-22 (×3): 7.5 mg via INTRAVENOUS
  Filled 2024-02-21 (×2): qty 1

## 2024-02-21 MED ORDER — ENOXAPARIN SODIUM 40 MG/0.4ML IJ SOSY: 40.0000 mg | PREFILLED_SYRINGE | INTRAMUSCULAR | 0 refills | Status: AC

## 2024-02-21 MED ORDER — LISINOPRIL 20 MG PO TABS
20.0000 mg | ORAL_TABLET | Freq: Every day | ORAL | Status: DC
  Administered 2024-02-22: 20 mg via ORAL

## 2024-02-21 MED ORDER — LETROZOLE 2.5 MG PO TABS
2.5000 mg | ORAL_TABLET | ORAL | Status: DC
  Administered 2024-02-22: 2.5 mg via ORAL
  Filled 2024-02-21: qty 1

## 2024-02-21 MED ORDER — DOCUSATE SODIUM 100 MG PO CAPS
100.0000 mg | ORAL_CAPSULE | Freq: Two times a day (BID) | ORAL | Status: DC
  Administered 2024-02-21 (×2): 100 mg via ORAL
  Filled 2024-02-21 (×2): qty 1

## 2024-02-21 MED ORDER — DEXAMETHASONE SODIUM PHOSPHATE 10 MG/ML IJ SOLN
8.0000 mg | Freq: Once | INTRAMUSCULAR | Status: AC
Start: 2024-02-21 — End: 2024-02-21
  Administered 2024-02-21: 8 mg via INTRAVENOUS

## 2024-02-21 MED ORDER — ACETAMINOPHEN 325 MG PO TABS: 325.0000 mg | ORAL_TABLET | Freq: Four times a day (QID) | ORAL | Status: DC | PRN

## 2024-02-21 MED ORDER — INSULIN ASPART 100 UNIT/ML IJ SOLN
0.0000 [IU] | Freq: Three times a day (TID) | INTRAMUSCULAR | Status: DC
  Administered 2024-02-21 – 2024-02-22 (×2): 5 [IU] via SUBCUTANEOUS
  Filled 2024-02-21 (×2): qty 1

## 2024-02-21 MED ORDER — LISINOPRIL-HYDROCHLOROTHIAZIDE 20-12.5 MG PO TABS: 1.0000 | ORAL_TABLET | ORAL | Status: DC

## 2024-02-21 MED ORDER — ORAL CARE MOUTH RINSE: 15.0000 mL | Freq: Once | OROMUCOSAL | Status: AC

## 2024-02-21 MED ORDER — ACETAMINOPHEN 10 MG/ML IV SOLN: 1000.0000 mg | Freq: Once | INTRAVENOUS | Status: DC | PRN

## 2024-02-21 MED ORDER — CHLORHEXIDINE GLUCONATE 0.12 % MT SOLN
15.0000 mL | Freq: Once | OROMUCOSAL | Status: AC
  Administered 2024-02-21: 15 mL via OROMUCOSAL

## 2024-02-21 MED ORDER — HYDROCHLOROTHIAZIDE 12.5 MG PO TABS
12.5000 mg | ORAL_TABLET | Freq: Every day | ORAL | Status: DC
  Administered 2024-02-22: 12.5 mg via ORAL
  Filled 2024-02-21: qty 1

## 2024-02-21 MED ORDER — BUPIVACAINE HCL (PF) 0.5 % IJ SOLN
INTRAMUSCULAR | Status: DC | PRN
  Administered 2024-02-21: 2.5 mL

## 2024-02-21 MED ORDER — CEFAZOLIN SODIUM-DEXTROSE 2-4 GM/100ML-% IV SOLN
2.0000 g | INTRAVENOUS | Status: AC
  Administered 2024-02-21: 2 g via INTRAVENOUS

## 2024-02-21 MED ORDER — METOCLOPRAMIDE HCL 10 MG PO TABS: 5.0000 mg | ORAL_TABLET | Freq: Three times a day (TID) | ORAL | Status: DC | PRN

## 2024-02-21 MED ORDER — BUPIVACAINE LIPOSOME 1.3 % IJ SUSP
INTRAMUSCULAR | Status: AC
  Filled 2024-02-21: qty 20

## 2024-02-21 MED ORDER — FENTANYL CITRATE (PF) 100 MCG/2ML IJ SOLN
INTRAMUSCULAR | Status: DC | PRN
  Administered 2024-02-21 (×2): 25 ug via INTRAVENOUS

## 2024-02-21 MED ORDER — METOCLOPRAMIDE HCL 5 MG/ML IJ SOLN: 5.0000 mg | Freq: Three times a day (TID) | INTRAMUSCULAR | Status: DC | PRN

## 2024-02-21 MED ORDER — PROPOFOL 500 MG/50ML IV EMUL
INTRAVENOUS | Status: DC | PRN
  Administered 2024-02-21: 75 ug/kg/min via INTRAVENOUS

## 2024-02-21 MED ORDER — PROPOFOL 10 MG/ML IV BOLUS
INTRAVENOUS | Status: DC | PRN
  Administered 2024-02-21: 40 mg via INTRAVENOUS

## 2024-02-21 MED ORDER — PHENYLEPHRINE HCL-NACL 20-0.9 MG/250ML-% IV SOLN
INTRAVENOUS | Status: AC
  Filled 2024-02-21: qty 250

## 2024-02-21 MED ORDER — INSULIN ASPART 100 UNIT/ML IJ SOLN
5.0000 [IU] | Freq: Once | INTRAMUSCULAR | Status: AC
  Administered 2024-02-21: 5 [IU] via SUBCUTANEOUS

## 2024-02-21 MED ORDER — MENTHOL 3 MG MT LOZG: 1.0000 | LOZENGE | OROMUCOSAL | Status: DC | PRN

## 2024-02-21 MED ORDER — FENTANYL CITRATE (PF) 100 MCG/2ML IJ SOLN
INTRAMUSCULAR | Status: AC
  Filled 2024-02-21: qty 2

## 2024-02-21 MED ORDER — SURGIPHOR WOUND IRRIGATION SYSTEM - OPTIME: TOPICAL | Status: DC | PRN

## 2024-02-21 MED ORDER — BUPIVACAINE-EPINEPHRINE (PF) 0.25% -1:200000 IJ SOLN
INTRAMUSCULAR | Status: AC
  Filled 2024-02-21: qty 30

## 2024-02-21 MED ORDER — ONDANSETRON HCL 4 MG/2ML IJ SOLN: 4.0000 mg | Freq: Once | INTRAMUSCULAR | Status: DC | PRN

## 2024-02-21 MED ORDER — KETOROLAC TROMETHAMINE 15 MG/ML IJ SOLN
INTRAMUSCULAR | Status: AC
  Filled 2024-02-21: qty 1

## 2024-02-21 MED ORDER — KETOROLAC TROMETHAMINE 30 MG/ML IJ SOLN
INTRAMUSCULAR | Status: AC
  Filled 2024-02-21: qty 1

## 2024-02-21 MED ORDER — LIDOCAINE HCL (CARDIAC) PF 100 MG/5ML IV SOSY
PREFILLED_SYRINGE | INTRAVENOUS | Status: DC | PRN
  Administered 2024-02-21: 30 mg via INTRAVENOUS

## 2024-02-21 MED ORDER — CHLORHEXIDINE GLUCONATE 0.12 % MT SOLN
OROMUCOSAL | Status: AC
  Filled 2024-02-21: qty 15

## 2024-02-21 MED ORDER — LACTATED RINGERS IV SOLN: INTRAVENOUS | Status: DC

## 2024-02-21 MED ORDER — ONDANSETRON HCL 4 MG PO TABS: 4.0000 mg | ORAL_TABLET | Freq: Four times a day (QID) | ORAL | Status: DC | PRN

## 2024-02-21 MED ORDER — HYDROCODONE-ACETAMINOPHEN 5-325 MG PO TABS
1.0000 | ORAL_TABLET | ORAL | Status: DC | PRN
  Administered 2024-02-21 – 2024-02-22 (×2): 1 via ORAL
  Filled 2024-02-21 (×2): qty 1

## 2024-02-21 MED ORDER — DOCUSATE SODIUM 100 MG PO CAPS: 100.0000 mg | ORAL_CAPSULE | Freq: Two times a day (BID) | ORAL | 0 refills | Status: AC

## 2024-02-21 MED ORDER — PANTOPRAZOLE SODIUM 40 MG PO TBEC
40.0000 mg | DELAYED_RELEASE_TABLET | Freq: Every day | ORAL | Status: DC
  Administered 2024-02-21 – 2024-02-22 (×2): 40 mg via ORAL
  Filled 2024-02-21 (×2): qty 1

## 2024-02-21 MED ORDER — EPHEDRINE SULFATE-NACL 50-0.9 MG/10ML-% IV SOSY
PREFILLED_SYRINGE | INTRAVENOUS | Status: DC | PRN
  Administered 2024-02-21: 10 mg via INTRAVENOUS
  Administered 2024-02-21: 5 mg via INTRAVENOUS

## 2024-02-21 MED ORDER — SODIUM CHLORIDE 0.9 % IV SOLN: INTRAVENOUS | Status: DC

## 2024-02-21 MED ORDER — ONDANSETRON HCL 4 MG PO TABS: 4.0000 mg | ORAL_TABLET | Freq: Four times a day (QID) | ORAL | 0 refills | Status: AC | PRN

## 2024-02-21 MED ORDER — MIDAZOLAM HCL 2 MG/2ML IJ SOLN
INTRAMUSCULAR | Status: AC
  Filled 2024-02-21: qty 2

## 2024-02-21 MED ORDER — TRAMADOL HCL 50 MG PO TABS: 50.0000 mg | ORAL_TABLET | Freq: Four times a day (QID) | ORAL | Status: DC | PRN

## 2024-02-21 MED ORDER — FESOTERODINE FUMARATE ER 4 MG PO TB24
4.0000 mg | ORAL_TABLET | Freq: Every day | ORAL | Status: DC
  Administered 2024-02-21 – 2024-02-22 (×2): 4 mg via ORAL
  Filled 2024-02-21 (×2): qty 1

## 2024-02-21 MED ORDER — CEFAZOLIN SODIUM-DEXTROSE 2-4 GM/100ML-% IV SOLN
2.0000 g | Freq: Four times a day (QID) | INTRAVENOUS | Status: AC
  Administered 2024-02-21 (×2): 2 g via INTRAVENOUS
  Filled 2024-02-21 (×2): qty 100

## 2024-02-21 MED ORDER — CELECOXIB 100 MG PO CAPS: 100.0000 mg | ORAL_CAPSULE | Freq: Two times a day (BID) | ORAL | 0 refills | Status: AC

## 2024-02-21 SURGICAL SUPPLY — 63 items
BLADE PATELLA REAM PILOT HOLE (MISCELLANEOUS) IMPLANT
BLADE SAW 90X13X1.19 OSCILLAT (BLADE) IMPLANT
BLADE SAW SAG 25X90X1.19 (BLADE) ×1 IMPLANT
BLADE SAW SAG 29X58X.64 (BLADE) ×1 IMPLANT
BNDG ELASTIC 6INX 5YD STR LF (GAUZE/BANDAGES/DRESSINGS) ×1 IMPLANT
BOWL CEMENT MIX W/ADAPTER (MISCELLANEOUS) IMPLANT
BRUSH SCRUB EZ PLAIN DRY (MISCELLANEOUS) ×1 IMPLANT
CEMENT BONE R 1X40 (Cement) IMPLANT
CHLORAPREP W/TINT 26 (MISCELLANEOUS) ×2 IMPLANT
COMPONENT FEM CR CEMT STD SZ5 (Joint) IMPLANT
COOLER POLAR GLACIER W/PUMP (MISCELLANEOUS) ×1 IMPLANT
CUFF TRNQT CYL 24X4X16.5-23 (TOURNIQUET CUFF) IMPLANT
CUFF TRNQT CYL 30X4X21-28X (TOURNIQUET CUFF) IMPLANT
DERMABOND ADVANCED .7 DNX12 (GAUZE/BANDAGES/DRESSINGS) ×1 IMPLANT
DRAPE SHEET LG 3/4 BI-LAMINATE (DRAPES) ×2 IMPLANT
DRSG MEPILEX SACRM 8.7X9.8 (GAUZE/BANDAGES/DRESSINGS) ×1 IMPLANT
DRSG OPSITE POSTOP 4X10 (GAUZE/BANDAGES/DRESSINGS) IMPLANT
DRSG OPSITE POSTOP 4X8 (GAUZE/BANDAGES/DRESSINGS) IMPLANT
ELECTRODE REM PT RTRN 9FT ADLT (ELECTROSURGICAL) ×1 IMPLANT
GLOVE BIO SURGEON STRL SZ8 (GLOVE) ×1 IMPLANT
GLOVE BIOGEL PI IND STRL 8 (GLOVE) ×1 IMPLANT
GLOVE PI ORTHO PRO STRL 7.5 (GLOVE) ×2 IMPLANT
GLOVE PI ORTHO PRO STRL SZ8 (GLOVE) ×2 IMPLANT
GLOVE SURG SYN 7.5 E (GLOVE) ×1 IMPLANT
GLOVE SURG SYN 7.5 PF PI (GLOVE) ×1 IMPLANT
GOWN SRG XL LVL 3 NONREINFORCE (GOWNS) ×1 IMPLANT
GOWN STRL REUS W/ TWL LRG LVL3 (GOWN DISPOSABLE) ×1 IMPLANT
GOWN STRL REUS W/ TWL XL LVL3 (GOWN DISPOSABLE) ×1 IMPLANT
HOOD PEEL AWAY T7 (MISCELLANEOUS) ×2 IMPLANT
IMPL ASF RT PSN 4-5/CD 10 (Joint) IMPLANT
KIT TURNOVER KIT A (KITS) ×1 IMPLANT
MANIFOLD NEPTUNE II (INSTRUMENTS) ×1 IMPLANT
MARKER SKIN DUAL TIP RULER LAB (MISCELLANEOUS) ×1 IMPLANT
MAT ABSORB FLUID 56X50 GRAY (MISCELLANEOUS) ×1 IMPLANT
NDL HYPO 21X1.5 SAFETY (NEEDLE) ×1 IMPLANT
NEEDLE HYPO 21X1.5 SAFETY (NEEDLE) ×1 IMPLANT
PACK TOTAL KNEE (MISCELLANEOUS) ×1 IMPLANT
PAD ARMBOARD POSITIONER FOAM (MISCELLANEOUS) ×3 IMPLANT
PAD WRAPON POLAR KNEE (MISCELLANEOUS) ×1 IMPLANT
PENCIL SMOKE EVACUATOR (MISCELLANEOUS) ×1 IMPLANT
PIN DRILL HDLS TROCAR 75 4PK (PIN) IMPLANT
SCREW FEMALE HEX FIX 25X2.5 (ORTHOPEDIC DISPOSABLE SUPPLIES) IMPLANT
SCREW HEX HEADED 3.5X27 DISP (ORTHOPEDIC DISPOSABLE SUPPLIES) IMPLANT
SLEEVE SCD COMPRESS KNEE MED (STOCKING) ×1 IMPLANT
SOL .9 NS 3000ML IRR UROMATIC (IV SOLUTION) ×1 IMPLANT
SOLUTION IRRIG SURGIPHOR (IV SOLUTION) ×1 IMPLANT
STEM POLY PAT PLY 29M KNEE (Knees) IMPLANT
STEM TIB ST PERS 14+30 (Stem) IMPLANT
STEM TIBIA 5 DEG SZ C R KNEE (Knees) IMPLANT
STOCKINETTE IMPERV 14X48 (MISCELLANEOUS) ×1 IMPLANT
SUT STRATAFIX 14 PDO 36 VLT (SUTURE) ×1 IMPLANT
SUT STRATAFIX PDO 1 14 VIOLET (SUTURE) ×1 IMPLANT
SUT VIC AB 0 CT1 36 (SUTURE) ×1 IMPLANT
SUT VIC AB 2-0 CT2 27 (SUTURE) ×2 IMPLANT
SUTURE STRATA 1 CT-1 DLB (SUTURE) ×1 IMPLANT
SUTURE STRATA SPIR 4-0 18 (SUTURE) ×1 IMPLANT
SUTURE VICRYL 1-0 27IN ABS (SUTURE) ×1 IMPLANT
SYR 20ML LL LF (SYRINGE) ×2 IMPLANT
TAPE CLOTH 3X10 WHT NS LF (GAUZE/BANDAGES/DRESSINGS) ×1 IMPLANT
TIP FAN IRRIG PULSAVAC PLUS (DISPOSABLE) ×1 IMPLANT
TOWEL OR 17X26 4PK STRL BLUE (TOWEL DISPOSABLE) IMPLANT
TRAP FLUID SMOKE EVACUATOR (MISCELLANEOUS) ×1 IMPLANT
WATER STERILE IRR 1000ML POUR (IV SOLUTION) ×1 IMPLANT

## 2024-02-21 NOTE — Progress Notes (Cosign Needed)
 Patient is not able to walk the distance required to go the bathroom, or he/she is unable to safely negotiate stairs required to access the bathroom.  A 3in1 BSC will alleviate this problem

## 2024-02-21 NOTE — Anesthesia Preprocedure Evaluation (Addendum)
 Anesthesia Evaluation  Patient identified by MRN, date of birth, ID band Patient awake    Reviewed: Allergy & Precautions, NPO status , Patient's Chart, lab work & pertinent test results  History of Anesthesia Complications Negative for: history of anesthetic complications  Airway Mallampati: III   Neck ROM: Full    Dental no notable dental hx.    Pulmonary sleep apnea and Continuous Positive Airway Pressure Ventilation    Pulmonary exam normal breath sounds clear to auscultation       Cardiovascular hypertension, Normal cardiovascular exam Rhythm:Regular Rate:Normal  ECG 02/14/24:  Normal sinus rhythm Normal ECG   Neuro/Psych negative neurological ROS     GI/Hepatic hiatal hernia,GERD  ,,  Endo/Other  diabetes, Type 2  Obesity   Renal/GU negative Renal ROS     Musculoskeletal  (+) Arthritis ,    Abdominal   Peds  Hematology Colon, uterine, breast CA   Anesthesia Other Findings   Reproductive/Obstetrics                             Anesthesia Physical Anesthesia Plan  ASA: 2  Anesthesia Plan: Spinal   Post-op Pain Management:    Induction: Intravenous  PONV Risk Score and Plan: 3 and Propofol  infusion, TIVA, Treatment may vary due to age or medical condition and Ondansetron   Airway Management Planned: Natural Airway and Nasal Cannula  Additional Equipment:   Intra-op Plan:   Post-operative Plan:   Informed Consent: I have reviewed the patients History and Physical, chart, labs and discussed the procedure including the risks, benefits and alternatives for the proposed anesthesia with the patient or authorized representative who has indicated his/her understanding and acceptance.       Plan Discussed with: CRNA  Anesthesia Plan Comments: (Plan for spinal and GA with natural airway, LMA/GETA backup.  Patient consented for risks of anesthesia including but not limited to:   - adverse reactions to medications - damage to eyes, teeth, lips or other oral mucosa - nerve damage due to positioning  - sore throat or hoarseness - headache, bleeding, infection, nerve damage 2/2 spinal - damage to heart, brain, nerves, lungs, other parts of body or loss of life  Informed patient about role of CRNA in peri- and intra-operative care.  Patient voiced understanding.)        Anesthesia Quick Evaluation

## 2024-02-21 NOTE — H&P (Signed)
 History of Present Illness: The patient is an 73 y.o. female seen in clinic today for follow-up evaluation of her right knee. Patient reports ongoing severe pain in her right knee up to a 7 out of 10 which limits her ability to walk and participate in activities on daily basis. She has been taking meloxicam with some relief but reports that her last injection gave her very short-term relief and she is interested in more permanent solution for her knee. She denies any new trauma to the knee. Reports sharp popping aching pain on a daily basis limiting her ability to walk. The patient denies fevers, chills, numbness, tingling, shortness of breath, chest pain, recent illness, or any trauma.   Patient is a non-smoker with hemoglobin A1c of 6.3 and a BMI of 38.5  Past Medical History: Past Medical History:  Diagnosis Date  Abnormal cytology 03/2014?  Monitored by Dr. Madelene Schanz  Anemia  Anemia, unspecified  Benign neoplasm of colon  Breast cancer in female (CMS/HHS-HCC) 2018  left, mastectomy  Cancer (CMS/HHS-HCC) 08/26/2004  Adenocarcinoma of the endometrium  Chicken pox  Chronic midline low back pain without sciatica 11/18/2015  Diabetes mellitus type 2, uncomplicated (CMS/HHS-HCC)  Essential hypertension, benign  History of cervical dysplasia  Hypertension  Inflammatory polyps of colon (CMS/HHS-HCC) 04/19/2010  Hyperplastic mucosa  Osteopenia  Pure hypercholesterolemia  Sleep apnea  Trigger finger (acquired)  Type II or unspecified type diabetes mellitus without mention of complication, not stated as uncontrolled (CMS/HHS-HCC)  Uterine cancer (CMS/HHS-HCC)   Past Surgical History: Past Surgical History:  Procedure Laterality Date  HYSTERECTOMY 08/26/2004  TAH with bilateral salpingo-oophorectomy  HERNIA REPAIR 01/2005  COLONOSCOPY 03/02/2005  COLONOSCOPY 04/19/2010  Inflamed and hyperplastic polyp  COLONOSCOPY 04/15/2015  Entire examined colon is normal/PHx of polyps/Repeat  73yrs/PYO  MASTECTOMY Left 10/26/2017  Dr Hortensia Ma  COLONOSCOPY 08/23/2020  Normal colon/PHx CP/Repeat 81yrs/TKT  CESAREAN SECTION  TAH-BSO  umbical hernia   Past Family History: Family History  Problem Relation Age of Onset  High blood pressure (Hypertension) Father  Diabetes Father  Diabetes type II Father  Lung disease Mother  Thyroid  disease Mother  Alcohol abuse Brother  Myocardial Infarction (Heart attack) Paternal Grandfather  Alcohol abuse Brother  Diabetes type II Sister  Obesity Sister  Diabetes Sister  Diabetes type II Sister  Obesity Sister  Diabetes Sister   Medications: Current Outpatient Medications  Medication Sig Dispense Refill  biotin 10 mg Tab Take by mouth  calcium  carbonate-vitamin D3 (CALTRATE 600+D) 600 mg(1,500mg ) -200 unit tablet Take 1 tablet by mouth once daily  cyanocobalamin (VITAMIN B12) 1000 MCG tablet Take 1,000 mcg by mouth once daily  glipiZIDE (GLUCOTROL XL) 10 MG XL tablet Take 1 tablet (10 mg total) by mouth once daily 30 tablet 11  glucosamine sulfate 500 mg Tab Take 1,500 mg by mouth once daily  insulin  GLARGINE (BASAGLAR KWIKPEN U-100 INSULIN ) pen injector (concentration 100 units/mL) Inject 12 Units subcutaneously at bedtime 3.6 mL 11  letrozole  (FEMARA ) 2.5 mg tablet Take 2.5 mg by mouth once daily  lisinopriL -hydroCHLOROthiazide  (ZESTORETIC ) 20-12.5 mg tablet TAKE 1 TABLET BY MOUTH EVERY DAY 90 tablet 1  meloxicam (MOBIC) 15 MG tablet TAKE 1 TABLET (15 MG TOTAL) BY MOUTH ONCE DAILY AS NEEDED FOR PAIN 30 tablet 1  metFORMIN  (GLUCOPHAGE ) 500 MG tablet TAKE 2 TABLETS BY MOUTH TWICE A DAY WITH MEALS 90 tablet 3  multivitamin tablet Take 1 tablet by mouth once daily.  ONETOUCH VERIO TEST STRIPS test strip Use to test blood sugars  twice daily 200 each 3  pen needle, diabetic 31 gauge x 5/16" needle Use as directed (Patient not taking: Reported on 10/22/2023) 100 each 12  pioglitazone (ACTOS) 15 MG tablet Take 15 mg by mouth once daily   trospium (SANCTURA) 20 mg tablet take 1 tablet by mouth twice a day 180 tablet 3   No current facility-administered medications for this visit.   Allergies: Allergies  Allergen Reactions  Crestor [Rosuvastatin] Muscle Pain    Visit Vitals: Vitals:  02/05/24 1025  BP: 128/80    Review of Systems:  A comprehensive 14 point ROS was performed, reviewed, and the pertinent orthopaedic findings are documented in the HPI.  Physical Exam: General/Constitutional: No apparent distress: well-nourished and well developed. Eyes: Pupils equal, round with synchronous movement. Pulmonary exam: Lungs clear to auscultation bilaterally no wheezing rales or rhonchi Cardiac exam: Regular rate and rhythm no obvious murmurs rubs or gallops. Integumentary: No impressive skin lesions present, except as noted in detailed exam. Neuro/Psych: Normal mood and affect, oriented to person, place and time.  Comprehensive Knee Exam: Gait Antalgic and slow  Alignment Neutral   Inspection Right Left  Skin Normal appearance with no obvious deformity. No ecchymosis or erythema. Normal appearance with no obvious deformity. No ecchymosis or erythema.  Soft Tissue No focal soft tissue swelling No focal soft tissue swelling  Quad Atrophy None None   Palpation  Right Left  Tenderness Medial and lateral joint line tenderness to palpation Medial joint line tenderness to palpation  Crepitus + patellofemoral and tibiofemoral crepitus + patellofemoral and tibiofemoral crepitus  Effusion None None   Range of Motion Right Left  Flexion 0-100 0-100  Extension Full knee extension without hyperextension Full knee extension without hyperextension   Ligamentous Exam Right Left  Lachman Normal Normal  Valgus 0 Normal Normal  Valgus 30 Normal Normal  Varus 0 Normal Normal  Varus 30 Normal Normal  Anterior Drawer Normal Normal  Posterior Drawer Normal Normal   Meniscal Exam Right Left  Hyperflexion Test Positive  Positive  Hyperextension Test Negative Negative  McMurray's Positive Negative   Neurovascular Right Left  Quadriceps Strength 5/5 5/5  Hamstring Strength 5/5 5/5  Hip Abductor Strength 4/5 4/5  Distal Motor Normal Normal  Distal Sensory Normal light touch sensation Normal light touch sensation  Distal Pulses Normal Normal     Imaging Studies: I have reviewed AP, lateral,sunrise, weight bearing knee X-rays (3 views) of the right knee taken at the last office visit show moderate to severe degenerative changes with medial joint space narrowing with bone-on-bone articulation, osteophyte formation, sclerosis, subchondral cyst formation. There is lateral subluxation of the tibia relative to the femur. Similar appearance on the AP and sunrise of the left knee with medial bone-on-bone articulation and severe bilateral patellofemoral degeneration. No fractures or dislocations noted in either knee.   Assessment:  Right knee osteoarthritis  Plan: Sherlie is a 73 year old female presents with right knee bone on bone arthritis. Based upon the patient's continued symptoms and failure to respond to conservative treatment, I have recommended a right total knee replacement for this patient. A long discussion took place with the patient describing what a total joint replacement is and what the procedure would entail. A knee model, similar to the implants that will be used during the operation, was utilized to demonstrate the implants. Choices of implant manufactures were discussed and reviewed. The ability to secure the implant utilizing cement or cementless (press fit) fixation was discussed. The approach and exposure  was discussed.   The hospitalization and post-operative care and rehabilitation were also discussed. The use of perioperative antibiotics and DVT prophylaxis were discussed. The risk, benefits and alternatives to a surgical intervention were discussed at length with the patient. The patient was  also advised of risks related to the medical comorbidities and elevated body mass index (BMI). A lengthy discussion took place to review the most common complications including but not limited to: stiffness, loss of function, complex regional pain syndrome, deep vein thrombosis, pulmonary embolus, heart attack, stroke, infection, wound breakdown, numbness, intraoperative fracture, damage to nerves, tendon,muscles, arteries or other blood vessels, death and other possible complications from anesthesia. The patient was told that we will take steps to minimize these risks by using sterile technique, antibiotics and DVT prophylaxis when appropriate and follow the patient postoperatively in the office setting to monitor progress. The possibility of recurrent pain, no improvement in pain and actual worsening of pain were also discussed with the patient.   Patient asked about and confirms no history of any reactions to metal or metal allergy in the past.  The discharge plan of care focused on the patient going home following surgery. The patient was encouraged to make the necessary arrangements to have someone stay with them when they are discharged home.   The benefits of surgery were discussed with the patient including the potential for improving the patient's current clinical condition through operative intervention. Alternatives to surgical intervention including continued conservative management were also discussed in detail. All questions were answered to the satisfaction of the patient. The patient participated and agreed to the plan of care as well as the use of the recommended implants for their total knee replacement surgery. An information packet was given to the patient to review prior to surgery.   The patient received clearance for surgery. All questions answered patient reasonable plan appropriations for right total knee replacement.  Portions of this record have been created using Administrator, sports. Dictation errors have been sought, but may not have been identified and corrected.  Venus Ginsberg MD

## 2024-02-21 NOTE — TOC Progression Note (Signed)
 Transition of Care [facility]) - Progression Note    Patient Details  Name: Dominique Guzman MRN: [id number] Date of Birth: 10-26-1950  Transition of [facility] CM/SW Contact  Alexandra Ice, RN Phone Number: 02/21/2024, 4:25 PM  Clinical Narrative:     Received message from bedside nurse patient is needing BSC and RW. Patient to discharge tomorrow. Sent DME orders to Jon with [employer], for processing.       Expected Discharge Plan and Services                                               Social Determinants of Health (SDOH) Interventions SDOH Screenings   Food Insecurity: No Food Insecurity (02/21/2024)  Housing: Low Risk  (02/21/2024)  Transportation Needs: No Transportation Needs (02/21/2024)  Utilities: Not At Risk (02/21/2024)  Financial Resource Strain: Low Risk  (09/26/2023)   Received from [facility]  Social Connections: Moderately Integrated (02/21/2024)  Tobacco Use: Low Risk  (02/21/2024)    Readmission Risk Interventions     No data to display

## 2024-02-21 NOTE — Discharge Instructions (Signed)
 Instructions after Total Knee Replacement   Dominique Guzman M.D.     Dept. of Orthopaedics & Sports Medicine  [facility]  [address]  [place], [place]  [address]  Phone: [phone]   Fax: [phone]    DIET: Drink plenty of non-alcoholic fluids. Resume your normal diet. Include foods high in fiber.  ACTIVITY:  You may use crutches or a walker with weight-bearing as tolerated, unless instructed otherwise. You may be weaned off of the walker or crutches by your Physical Therapist.  Do NOT place pillows under the knee. Anything placed under the knee could limit your ability to straighten the knee.   Continue doing gentle exercises. Exercising will reduce the pain and swelling, increase motion, and prevent muscle weakness.   Please continue to use the TED compression stockings for 2 weeks. You may remove the stockings at night, but should reapply them in the morning. Do not drive or operate any equipment until instructed.  WOUND CARE:  Continue to use the PolarCare or ice packs periodically to reduce pain and swelling. You may begin showering 3 days after surgery with honeycomb dressing. Remove honeycomb dressing 7 days after surgery and continue showering. Allow dermabond to fall off on its own.  MEDICATIONS: You may resume your regular medications. Please take the pain medication as prescribed on the medication. Do not take pain medication on an empty stomach. You have been given a prescription for a blood thinner (Lovenox or Coumadin). Please take the medication as instructed. (NOTE: After completing a 2 week course of Lovenox, take one 81 mg Enteric-coated aspirin twice a day for 3 additional weeks. This along with elevation will help reduce the possibility of phlebitis in your operated leg.) Do not drive or drink alcoholic beverages when taking pain medications.  POSTOPERATIVE CONSTIPATION PROTOCOL Constipation - defined medically as fewer than three stools per  week and severe constipation as less than one stool per week.  One of the most common issues patients have following surgery is constipation.  Even if you have a regular bowel pattern at home, your normal regimen is likely to be disrupted due to multiple reasons following surgery.  Combination of anesthesia, postoperative narcotics, change in appetite and fluid intake all can affect your bowels.  In order to avoid complications following surgery, here are some recommendations in order to help you during your recovery period.  Colace (docusate) - Pick up an over-the-counter form of Colace or another stool softener and take twice a day as long as you are requiring postoperative pain medications.  Take with a full glass of water daily.  If you experience loose stools or diarrhea, hold the colace until you stool forms back up.  If your symptoms do not get better within 1 week or if they get worse, check with your doctor.  Dulcolax (bisacodyl) - Pick up over-the-counter and take as directed by the product packaging as needed to assist with the movement of your bowels.  Take with a full glass of water.  Use this product as needed if not relieved by Colace only.   MiraLax (polyethylene glycol) - Pick up over-the-counter to have on hand.  MiraLax is a solution that will increase the amount of water in your bowels to assist with bowel movements.  Take as directed and can mix with a glass of water, juice, soda, coffee, or tea.  Take if you go more than two days without a movement. Do not use MiraLax more than once per day.  Call your doctor if you are still constipated or irregular after using this medication for 7 days in a row.  If you continue to have problems with postoperative constipation, please contact the office for further assistance and recommendations.  If you experience "the worst abdominal pain ever" or develop nausea or vomiting, please contact the office immediatly for further recommendations for  treatment.   CALL THE OFFICE FOR: Temperature above 101 degrees Excessive bleeding or drainage on the dressing. Excessive swelling, coldness, or paleness of the toes. Persistent nausea and vomiting.  FOLLOW-UP:  You should have an appointment to return to the office in 14 days after surgery. Arrangements have been made for continuation of Physical Therapy (either home therapy or outpatient therapy).   [facility] They will call you to set up when they are coming out to see you   [address], [place], [place] [address] Hours:  Open ? Closes 5?PM Phone: [phone]

## 2024-02-21 NOTE — Anesthesia Procedure Notes (Signed)
 Spinal  Patient location during procedure: OR Start time: 02/21/2024 10:36 AM End time: 02/21/2024 10:46 AM Reason for block: surgical anesthesia Staffing Performed: resident/CRNA  Anesthesiologist: Dorothey Gate, MD Resident/CRNA: Coley Davis, CRNA Performed by: Coley Davis, CRNA Authorized by: Dorothey Gate, MD   Preanesthetic Checklist Completed: patient identified, IV checked, site marked, risks and benefits discussed, surgical consent, monitors and equipment checked, pre-op evaluation and timeout performed Spinal Block Patient position: sitting Prep: DuraPrep Patient monitoring: heart rate, cardiac monitor, continuous pulse ox and blood pressure Approach: midline Location: L3-4 Injection technique: single-shot Needle Needle type: Sprotte  Needle gauge: 24 G Needle length: 9 cm Assessment Sensory level: T4 Events: CSF return Additional Notes Clear free flowing CSF at L3-L4; no paresthesias; attempts x2, patient tolerated well. CA

## 2024-02-21 NOTE — Evaluation (Signed)
 Physical Therapy Evaluation Patient Details Name: Dominique Guzman MRN: [id number] DOB: 08-Jan-1951 Today's Date: 02/21/2024  History of Present Illness  Pt admited for R TKR and is POD 0 at time of evaluation. HIstory includes HTN, DM, and breast cancer.  Clinical Impression  Pt is a pleasant 73 year old female who was admitted for R TKR this date. Pt performs bed mobility, transfers, and ambulation with cga and RW. Pt demonstrates ability to perform 10 SLRs with independence, therefore does not require KI for mobility. Pt demonstrates deficits with strength/pain/mobility. Would benefit from skilled PT to address above deficits and promote optimal return to PLOF. Pt will continue to receive skilled PT services while admitted and will defer to TOC/care team for updates regarding disposition planning. Sent secure chat to [facility] [place] as pt needs youth size RW for home use. Will plan to perform stair training next date to prep for discharge.       If plan is discharge home, recommend the following: A little help with walking and/or transfers;A little help with bathing/dressing/bathroom;Assist for transportation;Help with stairs or ramp for entrance   Can travel by private vehicle        Equipment Recommendations Rolling walker (2 wheels);BSC/3in1 (youth size)  Recommendations for Other Services       Functional Status Assessment Patient has had a recent decline in their functional status and demonstrates the ability to make significant improvements in function in a reasonable and predictable amount of time.     Precautions / Restrictions Precautions Precautions: Fall;Knee Precaution Booklet Issued: No Recall of Precautions/Restrictions: Intact Restrictions Weight Bearing Restrictions Per Provider Order: Yes RLE Weight Bearing Per Provider Order: Weight bearing as tolerated      Mobility  Bed Mobility Overal bed mobility: Needs Assistance Bed Mobility: Supine to Sit     Supine to sit:  Contact guard     General bed mobility comments: safe technique with ease of transfer    Transfers Overall transfer level: Needs assistance Equipment used: Rolling walker (2 wheels) Transfers: Sit to/from Stand Sit to Stand: Contact guard assist           General transfer comment: cues for sequencing. RW used. Pt educated to not pull up on RW despite performance    Ambulation/Gait Ambulation/Gait assistance: Contact guard assist Gait Distance (Feet): 40 Feet Assistive device: Rolling walker (2 wheels) Gait Pattern/deviations: Step-to pattern       General Gait Details: limited by pain. Step to antalgic pattern. Lunch arrived at bedside  Stairs            Wheelchair Mobility     Tilt Bed    Modified Rankin (Stroke Patients Only)       Balance Overall balance assessment: Needs assistance Sitting-balance support: Feet supported Sitting balance-Leahy Scale: Good     Standing balance support: Bilateral upper extremity supported Standing balance-Leahy Scale: Fair                               Pertinent Vitals/Pain Pain Assessment Pain Assessment: Faces Faces Pain Scale: Hurts little more Pain Location: R knee Pain Descriptors / Indicators: Operative site guarding Pain Intervention(s): Limited activity within patient's tolerance, Repositioned, Patient requesting pain meds-RN notified, Ice applied    Home Living Family/patient expects to be discharged to:: Private residence Living Arrangements: Spouse/significant other Available Help at Discharge: Family Type of Home: House Home Access: Stairs to enter Entrance Stairs-Rails: Right Entrance Stairs-Number of Steps:  4   Home Layout: One level Home Equipment: Firefighter      Prior Function Prior Level of Function : Independent/Modified Independent;Driving             Mobility Comments: indep, reports no falls ADLs Comments: indep     Extremity/Trunk Assessment   Upper  Extremity Assessment Upper Extremity Assessment: Overall WFL for tasks assessed    Lower Extremity Assessment Lower Extremity Assessment: Generalized weakness (R knee 3/5)       Communication   Communication Communication: No apparent difficulties    Cognition Arousal: Alert Behavior During Therapy: WFL for tasks assessed/performed   PT - Cognitive impairments: No apparent impairments                       PT - Cognition Comments: agreeable Following commands: Intact       Cueing Cueing Techniques: Verbal cues     General Comments      Exercises Total Joint Exercises Goniometric ROM: R knee AAROM: 0-80 degrees Other Exercises Other Exercises: Pt ambulated to bathroom, able to perform transfers with CGA. Cues for safety   Assessment/Plan    PT Assessment Patient needs continued PT services  PT Problem List Decreased strength;Decreased range of motion;Decreased balance;Decreased mobility;Decreased knowledge of use of DME;Pain       PT Treatment Interventions DME instruction;Gait training;Stair training;Therapeutic exercise    PT Goals (Current goals can be found in the Care Plan section)  Acute Rehab PT Goals Patient Stated Goal: to go home PT Goal Formulation: With patient Time For Goal Achievement: 03/06/24 Potential to Achieve Goals: Good    Frequency BID     Co-evaluation               AM-PAC PT "6 Clicks" Mobility  Outcome Measure Help needed turning from your back to your side while in a flat bed without using bedrails?: A Little Help needed moving from lying on your back to sitting on the side of a flat bed without using bedrails?: A Little Help needed moving to and from a bed to a chair (including a wheelchair)?: A Little Help needed standing up from a chair using your arms (e.g., wheelchair or bedside chair)?: A Little Help needed to walk in hospital room?: A Little Help needed climbing 3-5 steps with a railing? : A Lot 6 Click  Score: 17    End of Session Equipment Utilized During Treatment: Gait belt Activity Tolerance: Patient tolerated treatment well Patient left: in chair;with call bell/phone within reach;with family/visitor present Nurse Communication: Mobility status PT Visit Diagnosis: Muscle weakness (generalized) (M62.81);Difficulty in walking, not elsewhere classified (R26.2);Pain Pain - Right/Left: Right Pain - part of body: Knee    Time: 8119-1478 PT Time Calculation (min) (ACUTE ONLY): 24 min   Charges:   PT Evaluation $PT Eval Low Complexity: 1 Low PT Treatments $Therapeutic Activity: 8-22 mins PT General Charges $$ ACUTE PT VISIT: 1 Visit         Amparo Balk, PT, DPT, GCS [phone]   Laketia Vicknair 02/21/2024, 4:38 PM

## 2024-02-21 NOTE — Op Note (Signed)
 Patient Name: Dominique Guzman  ZOX:[id number]  Pre-Operative Diagnosis: Right knee Osteoarthritis  Post-Operative Diagnosis: (same)  Procedure: Right Total Knee Arthroplasty  Components/Implants: Femur: Persona Size 5 CR   Tibia: Persona Size C w/ 14x23mm stem extension  Poly: 10mm MC  Patella: 29x14mm symmetric  Femoral Valgus Cut Angle: 5 degrees  Distal Femoral Re-cut: none  Patella Resurfacing: yes   Date of Surgery: 02/21/2024  Surgeon: Venus Ginsberg MD  Assistant: Francenia Ingle PA (present and scrubbed throughout the case, critical for assistance with exposure, retraction, instrumentation, and closure)   Anesthesiologist: Mazzoni  Anesthesia: Spinal   Tourniquet Time: 72 min  EBL: 50cc  IVF: 300cc  Complications: None   Brief history: The patient is a 73 year old female with a history of osteoarthritis of the right knee with pain limiting their range of motion and activities of daily living, which has failed multiple attempts at conservative therapy.  The risks and benefits of total knee arthroplasty as definitive surgical treatment were discussed with the patient, who opted to proceed with the operation.  After outpatient medical clearance and optimization was completed the patient was admitted to [facility] for the procedure.  All preoperative films were reviewed and an appropriate surgical plan was made prior to surgery. Preoperative range of motion was 0 to 100. The patient was identified as having a varus alignment.   Description of procedure: The patient was brought to the operating room where laterality was confirmed by all those present to be the right side.   Spinal anesthesia was administered and the patient received an intravenous dose of antibiotics for surgical prophylaxis and a dose of tranexamic acid.  Patient is positioned supine on the operating room table with all bony prominences well-padded.  A well-padded tourniquet was  applied to the right thigh.  The knee was then prepped and draped in usual sterile fashion with multiple layers of adhesive and nonadhesive drapes.  All of those present in the operating room participated in a surgical timeout laterality and patient were confirmed.   An Esmarch was wrapped around the extremity and the leg was elevated and the knee flexed.  The tourniquet was inflated to a pressure of 275 mmHg. The Esmarch was removed and the leg was brought down to full extension.  The patella and tibial tubercle identified and outlined using a marking pen and a midline skin incision was made with a knife carried through the subcutaneous tissue down to the extensor retinaculum.  After exposure of the extensor mechanism the medial parapatellar arthrotomy was performed with a scalpel and electrocautery extending down medial and distal to the tibial tubercle taking care to avoid incising the patellar tendon.   A standard medial release was performed over the proximal tibia.  The knee was brought into extension in order to excise the fat pad taking care not to damage the patella tendon.  The superior soft tissue was removed from the anterior surface of the distal femur to visualize for the procedure.  The knee was then brought into flexion with the patella subluxed laterally and subluxing the tibia anteriorly.  The ACL was transected and removed with electrocautery and additional soft tissue was removed from the proximal surface of the tibia to fully expose. The PCL was found to be intact and was preserved and later partially released to improve flexion balance.  An extramedullary tibial cutting guide was then applied to the leg with a spring-loaded ankle clamp placed around the distal tibia just above the malleoli  the angulation of the guide was adjusted to give some posterior slope in the tibial resection with an appropriate varus/valgus alignment.  The resection guide was then pinned to the proximal tibia and  the proximal tibial surface was resected with an oscillating saw.  Careful attention was paid to ensure the blade did not disrupt any of the soft tissues including any lateral or medial ligament.  Attention was then turned to the femur, with the knee slightly flexed a opening drill was used to enter the medullary canal of the femur.  After removing the drill marrow was suctioned out to decompress the distal femur.  An intramedullary femoral guide was then inserted into the drill hole and the alignment guide was seated firmly against the distal end of the medial femoral condyle.  The distal femoral cutting guide was then attached and pinned securely to the anterior surface of the femur and the intramedullary rod and alignment guide was removed.  Distal femur resection was then performed with an oscillating saw with retractors protecting medial and laterally.   The distal cutting block was then removed and the extension gap was checked with a spacer.  Extension gap was found to be appropriately sized to accommodate the spacer block.   The femoral sizing guide was then placed securely into the posterior condyles of the femur and the femoral size was measured and determined to be 5.  The size 5; 4-in-1 cutting guide was placed in position and secured with 2 pins.  The anterior posterior and chamfer resections were then performed with an oscillating saw.  Bony fragments and osteophytes were then removed.  Using a lamina spreader the posterior medial and lateral condyles were checked for additional osteophytes and posterior soft tissue remnants.  Any remaining meniscus was removed at this time.  Periarticular injection was performed in the meniscal rims and posterior capsule with aspiration performed to ensure no intravascular injection.   The tibia was then exposed and the tibial trial was pinned onto the plateau after confirming appropriate orientation and rotation.  Using the drill bushing the tibia was  prepared to the appropriate drill depth.  Tibial broach impactor was then driven through the punch guide using a mallet.  The femoral trial component was then inserted onto the femur.  A trial tibial polyethylene bearing was then placed and the knee was reduced.  The knee achieved full extension with no hyperextension and was found to be balanced in flexion and extension with the trials in place.  The knee was then brought into full extension the patella was everted and held with 2 Kocher clamps.  The articular surface of the patella was then resected with an patella reamer and saw after careful measurement with a caliper.  The patella was then prepared with the drill guide and a trial patella was placed.  The knee was then taken through range of motion and it was found that the patella articulated appropriately with the trochlea and good patellofemoral motion without subluxation.    The correct final components for implantation were confirmed and opened by the circulator nurse.  A bone plug was fashioned from the patient's bone cuts to plug the intramedullary femoral canal access hole and was tamped into place.  The prepared surfaces of the patella femur and tibia were cleaned with pulsatile lavage to remove all blood fat and other material and then the surfaces were dried.  2 bags of cement were mixed under vacuum and the components were cemented into place.  Excess cement was removed with curettes and forceps. A trial polyethylene tibial component was placed and the knee was brought into extension to allow the cement to set.  At this time the periarticular injection cocktail was placed in the soft tissues surrounding the knee.  After full curing of the cement the balance of the knee was checked again and the final polyethylene size was confirmed. The tibial component was irrigated and locking mechanism checked to ensure it was clear of debris. The real polyethylene tibial component was implanted and the knee  was brought through a range of motion.   The knee was then irrigated with copious amount of normal saline via pulsatile lavage to remove all loose bodies and other debris.  The knee was then irrigated with surgiphor betadine based wash and reirrigated with saline.  The tourniquet was then dropped and all bleeding vessels were identified and coagulated.  The arthrotomy was approximated with #1 Vicryl and closed with #1 Stratafix suture.  The knee was brought into slight flexion and the subcutaneous tissues were closed with 0 Vicryl, 2-0 Vicryl and a running subcuticular 4-0 stratafix barbed suture.  Skin was then glued with Dermabond.  A sterile adhesive dressing was then placed along with a sequential compression device to the calf, a Ted stocking, and a cryotherapy cuff.   Sponge, needle, and Lap counts were all correct at the end of the case.   The patient was transferred off of the operating room table to a hospital bed, good pulses were found distally on the operative side.  The patient was transferred to the recovery room in stable condition.

## 2024-02-21 NOTE — Interval H&P Note (Signed)
Patient history and physical updated. Consent reviewed including risks, benefits, and alternatives to surgery. Patient agrees with above plan to proceed with right total knee arthroplasty  

## 2024-02-21 NOTE — Inpatient Diabetes Management (Addendum)
 Inpatient Diabetes Program Recommendations  AACE/ADA: New Consensus Statement on Inpatient Glycemic Control (2015)  Target Ranges:  Prepandial:   less than 140 mg/dL      Peak postprandial:   less than 180 mg/dL (1-2 hours)      Critically ill patients:  140 - 180 mg/dL    Latest Reference Range & Units 02/21/24 08:26 02/21/24 11:37 02/21/24 13:16  Glucose-Capillary 70 - 99 mg/dL 191 (H)  5 units Novolog  @0944  140 (H)  8 mg Decadron  @1115  134 (H)  (H): Data is abnormally high   Admit with: Total R Knee  History: DM2  Home DM Meds: Glipizide 10 mg daily        Basaglar 12 units daily        Metformin  1000 mg BID        Actos 15 mg daily  Current Orders: Novolog  Moderate Correction Scale/ SSI (0-15 units) TID AC + HS    MD--Received consult for post-op insulin  recommendations  Note pt received Decadron  this AM--CBGs will likely rise this evening  Please consider starting basal insulin  tonight: Semglee 10 units at bedtime (80% home dose)    --Will follow patient during hospitalization--  Langston Pippins RN, MSN, CDCES Diabetes Coordinator Inpatient Glycemic Control Team Team Pager: [phone] (8a-5p)

## 2024-02-21 NOTE — Discharge Summary (Signed)
 Physician Discharge Summary  Patient ID: Dominique Guzman MRN: [id number] DOB/AGE: 12/04/1950 73 y.o.  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, estrogen receptor positive (HCC) 05/04/2017   History of uterine cancer 04/04/2017   Chronic midline low back pain without sciatica 11/18/2015   Controlled type 2 diabetes mellitus without complication, without long-term current use of insulin  (HCC) 11/18/2015   Essential hypertension 11/18/2015   Diabetes type 2, controlled (HCC) 05/19/2015   HTN (hypertension) 05/25/2014   Allergic rhinitis 02/11/2014    Past Medical History:  Diagnosis Date   Anemia    Arthritis    HIP-LEFT   Benign neoplasm of colon    Breast cancer (HCC) 2018   left breast   Cancer (HCC)    Dizziness    DM (diabetes mellitus), type 2 (HCC)    Genetic testing 01/10/2018   Multi-Cancer panel (83 genes) @ [facility] - No pathogenic mutations detected   GERD (gastroesophageal reflux disease)    H/O osteopenia    History of hiatal hernia    Hypertension    OAB (overactive bladder)    Personal history of chemotherapy    Personal history of radiation therapy    Sleep apnea    cannot tolerate cpap   Trigger finger, acquired    Uterine cancer (HCC)      Transfusion: none   Consultants (if any):   Discharged Condition: Improved  Hospital Course: Dominique Guzman is an 73 y.o. female who was admitted 02/21/2024 with a diagnosis of S/P total knee arthroplasty, right and went to the  operating room on 02/21/2024 and underwent the above named procedures.    Surgeries: Procedure(s): ARTHROPLASTY, KNEE, TOTAL on 02/21/2024 Patient tolerated the surgery well. Taken to PACU where she was stabilized and then transferred to the orthopedic floor.  Started on Lovenox 30 mg q 12 hrs. TEDs and SCDs applied bilaterally. Heels elevated on bed. No evidence of DVT. Negative Homan. Physical therapy started on day #1 for gait training and transfer. OT started day #1 for ADL and assisted devices.  Patient's IV was d/c on day #1. Patient was able to safely and independently complete all PT goals. PT recommending discharge to home.    On post op day #1 patient was stable and ready for discharge to home with HHPT.  Implants: Femur: Persona Size 5 CR   Tibia: Persona Size C w/ 14x32mm stem extension  Poly: 10mm MC  Patella: 29x70mm symmetric   She was given perioperative antibiotics:  Anti-infectives (From admission, onward)    Start     Dose/Rate Route Frequency Ordered Stop   02/21/24 1700  ceFAZolin  (ANCEF ) IVPB 2g/100 mL premix        2 g 200 mL/hr over 30 Minutes Intravenous Every 6 hours 02/21/24 1451 02/22/24 0459   02/21/24 0830  ceFAZolin  (ANCEF ) IVPB 2g/100 mL premix        2 g 200 mL/hr over 30 Minutes Intravenous On call to O.R. 02/21/24 7829 02/21/24 1052     .  She was given sequential compression devices, early ambulation,  and Lovenox TEDs for DVT prophylaxis.  She benefited maximally from the hospital stay and there were no complications.    Recent vital signs:  Vitals:   02/21/24 1455 02/21/24 1709  BP: 113/70 (!) 115/56  Pulse: 96 93  Resp: 16 15  Temp: 98.2 F (36.8 C) 98.1 F (36.7 C)  SpO2: 94% 93%    Recent laboratory studies:  Lab Results  Component Value Date   HGB 11.8 (L) 12/21/2023   HGB 12.6 06/13/2023   HGB 12.7 12/04/2022   Lab Results  Component Value Date   WBC 10.8 (H) 12/21/2023   PLT 400 12/21/2023   No results found for:  "INR" Lab Results  Component Value Date   NA 135 02/14/2024   K 3.6 02/14/2024   CL 100 02/14/2024   CO2 24 02/14/2024   BUN 25 (H) 02/14/2024   CREATININE 1.10 (H) 02/14/2024   GLUCOSE 121 (H) 02/14/2024    Discharge Medications:   Allergies as of 02/21/2024       Reactions   Crestor [rosuvastatin] Other (See Comments)   muscle pain        Medication List     STOP taking these medications    meloxicam 15 MG tablet Commonly known as: MOBIC       TAKE these medications    acetaminophen  500 MG tablet Commonly known as: TYLENOL  Take 2 tablets (1,000 mg total) by mouth every 8 (eight) hours.   Basaglar KwikPen 100 UNIT/ML Inject 12 Units into the skin every evening.   celecoxib 100 MG capsule Commonly known as: CeleBREX Take 1 capsule (100 mg total) by mouth 2 (two) times daily for 7 days.   cholecalciferol 25 MCG (1000 UNIT) tablet Commonly known as: VITAMIN D3 Take 1,000 Units by mouth daily.   docusate sodium 100 MG capsule Commonly known as: COLACE Take 1 capsule (100 mg total) by mouth 2 (two) times daily.   enoxaparin 40 MG/0.4ML injection Commonly known as: LOVENOX Inject 0.4 mLs (40 mg total) into the skin daily for 14 days.   glipiZIDE 10 MG 24 hr tablet Commonly known as: GLUCOTROL XL Take 10 mg by mouth daily.   ipratropium 0.03 % nasal spray Commonly known as: ATROVENT  Place 2 sprays into both nostrils daily as needed for rhinitis.   letrozole  2.5 MG tablet Commonly known as: FEMARA  Take 1 tablet (2.5 mg total) by mouth daily. What changed: when to take this   lisinopril -hydrochlorothiazide  20-12.5 MG tablet Commonly known as: ZESTORETIC  Take 1 tablet by mouth every morning.   metFORMIN  500 MG tablet Commonly known as: GLUCOPHAGE  Take 1,000 mg by mouth 2 (two) times daily.   multivitamin tablet Take 1 tablet by mouth daily.   ondansetron  4 MG tablet Commonly known as: ZOFRAN  Take 1 tablet (4 mg total) by mouth every 6 (six)  hours as needed for nausea.   oxyCODONE 5 MG immediate release tablet Commonly known as: Roxicodone Take 0.5-1 tablets (2.5-5 mg total) by mouth every 8 (eight) hours as needed for breakthrough pain.   pioglitazone 15 MG tablet Commonly known as: ACTOS Take 15 mg by mouth daily.   traMADol 50 MG tablet Commonly known as: ULTRAM Take 1 tablet (50 mg total) by mouth every 6 (six) hours as needed for moderate pain (pain score 4-6).   trospium 20 MG tablet Commonly known as: SANCTURA Take 20 mg by mouth daily after lunch.               Durable  Medical Equipment  (From admission, onward)           Start     Ordered   02/21/24 1625  For home use only DME Bedside commode  Once       Question:  Patient needs a bedside commode to treat with the following condition  Answer:  Status post right unicompartmental knee replacement   02/21/24 1624   02/21/24 1624  For home use only DME Walker rolling  Once       Question Answer Comment  Walker: With 5 Inch Wheels   Patient needs a walker to treat with the following condition Status post right partial knee replacement      02/21/24 1624            Diagnostic Studies: DG Knee 1-2 Views Right Result Date: 02/21/2024 CLINICAL DATA:  Elective surgery.  Postoperative. EXAM: RIGHT KNEE - 1-2 VIEW COMPARISON:  None Available. FINDINGS: Status post recent total right knee arthroplasty. No perihardware lucency is seen to indicate hardware failure or loosening. Small joint effusion. No acute fracture is seen. No dislocation. Mild anterior knee and distal thigh postsurgical subcutaneous air. IMPRESSION: Status post recent total right knee arthroplasty without evidence of hardware failure. Electronically Signed   By: Bertina Broccoli M.D.   On: 02/21/2024 13:12    Disposition:      Follow-up Information     Coralyn Derry, PA-C Follow up in 2 week(s).   Specialties: Orthopedic Surgery, Emergency Medicine Contact information: [address] [place] [address] [phone]                  Signed: Coralyn Derry 02/21/2024, 6:16 PM

## 2024-02-21 NOTE — Transfer of Care (Signed)
 Immediate Anesthesia Transfer of Care Note  Patient: Dominique Guzman  Procedure(s) Performed: ARTHROPLASTY, KNEE, TOTAL (Right: Knee)  Patient Location: PACU  Anesthesia Type:Spinal  Level of Consciousness: awake, alert , and oriented  Airway & Oxygen Therapy: Patient Spontanous Breathing  Post-op Assessment: Report given to RN and Post -op Vital signs reviewed and stable  Post vital signs: stable  Last Vitals:  Vitals Value Taken Time  BP 124/57 02/21/24 1307  Temp    Pulse 102 02/21/24 1307  Resp 27 02/21/24 1307  SpO2 100 % 02/21/24 1307  Vitals shown include unfiled device data.  Last Pain:  Vitals:   02/21/24 0829  TempSrc: Temporal  PainSc: 0-No pain         Complications: No notable events documented.

## 2024-02-21 NOTE — Anesthesia Postprocedure Evaluation (Signed)
 Anesthesia Post Note  Patient: AARIAN BURFEIND  Procedure(s) Performed: ARTHROPLASTY, KNEE, TOTAL (Right: Knee)  Patient location during evaluation: PACU Anesthesia Type: Spinal Level of consciousness: oriented and awake and alert Pain management: pain level controlled Vital Signs Assessment: post-procedure vital signs reviewed and stable Respiratory status: spontaneous breathing, respiratory function stable and patient connected to nasal cannula oxygen Cardiovascular status: blood pressure returned to baseline and stable Postop Assessment: no headache, no backache and no apparent nausea or vomiting Anesthetic complications: no   No notable events documented.   Last Vitals:  Vitals:   02/21/24 1330 02/21/24 1345  BP: (!) 119/55 115/67  Pulse: 88 86  Resp: (!) 21 (!) 25  Temp:    SpO2: 98% 100%    Last Pain:  Vitals:   02/21/24 1307  TempSrc:   PainSc: 0-No pain                 Dorothey Gate

## 2024-02-22 ENCOUNTER — Encounter: Payer: Self-pay | Admitting: Orthopedic Surgery

## 2024-02-22 DIAGNOSIS — M174 Other bilateral secondary osteoarthritis of knee: Secondary | ICD-10-CM | POA: Diagnosis not present

## 2024-02-22 LAB — BASIC METABOLIC PANEL WITH GFR
Anion gap: 12 (ref 5–15)
BUN: 40 mg/dL — ABNORMAL HIGH (ref 8–23)
CO2: 19 mmol/L — ABNORMAL LOW (ref 22–32)
Calcium: 9.1 mg/dL (ref 8.9–10.3)
Chloride: 100 mmol/L (ref 98–111)
Creatinine, Ser: 1.45 mg/dL — ABNORMAL HIGH (ref 0.44–1.00)
GFR, Estimated: 39 mL/min — ABNORMAL LOW (ref >60)
Glucose, Bld: 256 mg/dL — ABNORMAL HIGH (ref 70–99)
Potassium: 3.9 mmol/L (ref 3.5–5.1)
Sodium: 131 mmol/L — ABNORMAL LOW (ref 135–145)

## 2024-02-22 LAB — CBC
HCT: 29.1 % — ABNORMAL LOW (ref 36.0–46.0)
Hemoglobin: 9.9 g/dL — ABNORMAL LOW (ref 12.0–15.0)
MCH: 28.5 pg (ref 26.0–34.0)
MCHC: 34 g/dL (ref 30.0–36.0)
MCV: 83.9 fL (ref 80.0–100.0)
Platelets: 361 10*3/uL (ref 150–400)
RBC: 3.47 MIL/uL — ABNORMAL LOW (ref 3.87–5.11)
RDW: 13.3 % (ref 11.5–15.5)
WBC: 15.8 10*3/uL — ABNORMAL HIGH (ref 4.0–10.5)
nRBC: 0 % (ref 0.0–0.2)

## 2024-02-22 LAB — GLUCOSE, CAPILLARY: Glucose-Capillary: 221 mg/dL — ABNORMAL HIGH (ref 70–99)

## 2024-02-22 MED ORDER — LISINOPRIL 20 MG PO TABS
ORAL_TABLET | ORAL | Status: AC
  Filled 2024-02-22: qty 1

## 2024-02-22 MED ORDER — SODIUM CHLORIDE 0.9 % IV BOLUS
500.0000 mL | Freq: Once | INTRAVENOUS | Status: AC
  Administered 2024-02-22: 500 mL via INTRAVENOUS

## 2024-02-22 NOTE — TOC Transition Note (Signed)
 Transition of Care [facility]) - Discharge Note   Patient Details  Name: Dominique Guzman MRN: [id number] Date of Birth: 1951-03-12  Transition of [facility]) CM/SW Contact:  Alexandra Ice, RN Phone Number: 02/22/2024, 10:18 AM   Clinical Narrative:    Patient to discharge today, home with home health. [facility] set up by surgeon's office prior to surgery. Rolling walker order sent to [employer], equipment delivered to bedside. Home health agency added to AVS.    Final next level of care: Home w Home Health Services Barriers to Discharge: Barriers Resolved   Patient Goals and CMS Choice            Discharge Placement                  Name of family member notified: Cornel Diesel Patient and family notified of of transfer: 02/22/24  Discharge Plan and Services Additional resources added to the After Visit Summary for                  DME Arranged: Walker rolling DME Agency: [facility] Date DME Agency Contacted: 02/22/24 Time DME Agency Contacted: 1017 Representative spoke with at DME Agency: Sam Creighton HH Arranged: PT, OT HH Agency: [facility] Date HH Agency Contacted: 02/22/24 Time HH Agency Contacted: 1017 Representative spoke with at [facility] Agency: Shaun  Social Drivers of Health (SDOH) Interventions SDOH Screenings   Food Insecurity: No Food Insecurity (02/21/2024)  Housing: Low Risk  (02/21/2024)  Transportation Needs: No Transportation Needs (02/21/2024)  Utilities: Not At Risk (02/21/2024)  Financial Resource Strain: Low Risk  (09/26/2023)   Received from [facility]  Social Connections: Moderately Integrated (02/21/2024)  Tobacco Use: Low Risk  (02/21/2024)     Readmission Risk Interventions     No data to display

## 2024-02-22 NOTE — Progress Notes (Signed)
 Physical Therapy Treatment Patient Details Name: Dominique Guzman MRN: [id number] DOB: [birth year] Today's Date: 02/22/2024   History of Present Illness Pt admited for R TKR and is POD 0 at time of evaluation. HIstory includes HTN, DM, and breast cancer.    PT Comments  Pt was long sitting in bed upon arrival. She is alert and cooperative throughout session. Pt demonstrated safe abilities to exit bed, stand, and tolerate ambulation > 120 ft with RW. She safely performed stairs to simulate home entry/exit. Overall pt is progressing well towards PLOF. She will continue to benefit for skilled PT at DC to maximize her AROM, strength, and safe functional mobility. Pt is cleared from an acute PT standpoint for safe DC home with HHPT to follow.      If plan is discharge home, recommend the following: A little help with walking and/or transfers;A little help with bathing/dressing/bathroom;Assist for transportation;Help with stairs or ramp for entrance     Equipment Recommendations  Other (comment) (Pt has personal DME in room)       Precautions / Restrictions Precautions Precautions: Fall;Knee Precaution Booklet Issued: No Recall of Precautions/Restrictions: Intact Restrictions Weight Bearing Restrictions Per Provider Order: Yes RLE Weight Bearing Per Provider Order: Weight bearing as tolerated     Mobility  Bed Mobility Overal bed mobility: Needs Assistance Bed Mobility: Supine to Sit  Supine to sit: Supervision   Transfers Overall transfer level: Needs assistance Equipment used: Rolling walker (2 wheels) Transfers: Sit to/from Stand Sit to Stand: Supervision   Ambulation/Gait Ambulation/Gait assistance: Supervision Gait Distance (Feet): 150 Feet Assistive device: Rolling walker (2 wheels) Gait Pattern/deviations: Step-to pattern, Trunk flexed Gait velocity: decreased  General Gait Details: Pt tolerated ambulation 150 ft with RW without LOB or safety concerns   Stairs Stairs:  Yes Stairs assistance: Supervision Stair Management: Step to pattern, One rail Right, Sideways Number of Stairs: 4 General stair comments: Pt demonstrated safe performance of stairs to simiulate home entry/exit. Pt states confidence in her abiltiies and feels safe to DC home later this date   Balance Overall balance assessment: Needs assistance Sitting-balance support: Feet supported Sitting balance-Leahy Scale: Good     Standing balance support: Bilateral upper extremity supported, During functional activity, Reliant on assistive device for balance Standing balance-Leahy Scale: Fair Standing balance comment: reliant on RW for dynamic standing activity     Communication Communication Communication: No apparent difficulties  Cognition Arousal: Alert Behavior During Therapy: WFL for tasks assessed/performed   PT - Cognitive impairments: No apparent impairments      PT - Cognition Comments: Pt is alert and oriented x 3. no family present to dertrmine basline cognition. Does have slight cogntiion deficits come to light during session but author quetions if due to pain medications Following commands: Intact      Cueing Cueing Techniques: Verbal cues  Exercises Total Joint Exercises Goniometric ROM: R knee AROM 2-84    General Comments General comments (skin integrity, edema, etc.): Chartered loss adjuster issued HEP ahndout and reviewed car transfers, positioning, importance of polar care use, and educated on importance of routine mobility      Pertinent Vitals/Pain Pain Assessment Pain Assessment: 0-10 Pain Score: 4  Pain Location: R knee Pain Descriptors / Indicators: Operative site guarding Pain Intervention(s): Limited activity within patient's tolerance, Monitored during session, Premedicated before session, Repositioned, Ice applied     PT Goals (current goals can now be found in the care plan section) Acute Rehab PT Goals Patient Stated Goal: to go home Progress towards  PT goals:  Progressing toward goals    Frequency    BID       AM-PAC PT "6 Clicks" Mobility   Outcome Measure  Help needed turning from your back to your side while in a flat bed without using bedrails?: A Little Help needed moving from lying on your back to sitting on the side of a flat bed without using bedrails?: A Little Help needed moving to and from a bed to a chair (including a wheelchair)?: A Little Help needed standing up from a chair using your arms (e.g., wheelchair or bedside chair)?: A Little Help needed to walk in hospital room?: A Little Help needed climbing 3-5 steps with a railing? : A Little 6 Click Score: 18    End of Session   Activity Tolerance: Patient tolerated treatment well Patient left: in chair;with call bell/phone within reach;with family/visitor present Nurse Communication: Mobility status PT Visit Diagnosis: Muscle weakness (generalized) (M62.81);Difficulty in walking, not elsewhere classified (R26.2);Pain Pain - Right/Left: Right Pain - part of body: Knee     Time: 1610-9604 PT Time Calculation (min) (ACUTE ONLY): 17 min  Charges:    $Gait Training: 8-22 mins PT General Charges $$ ACUTE PT VISIT: 1 Visit                     Chester Costa PTA 02/22/24, 10:50 AM

## 2024-02-22 NOTE — Plan of Care (Signed)
   Problem: Activity: Goal: Ability to avoid complications of mobility impairment will improve Outcome: Progressing   Problem: Clinical Measurements: Goal: Postoperative complications will be avoided or minimized Outcome: Progressing   Problem: Pain Management: Goal: Pain level will decrease with appropriate interventions Outcome: Progressing

## 2024-02-22 NOTE — Progress Notes (Signed)
   Subjective: 1 Day Post-Op Procedure(s) (LRB): ARTHROPLASTY, KNEE, TOTAL (Right) Patient reports pain as mild.   Patient is well, and has had no acute complaints or problems Denies any CP, SOB, ABD pain. We will continue therapy today.  Plan is to go Home after hospital stay.  Objective: Vital signs in last 24 hours: Temp:  [97.8 F (36.6 C)-98.9 F (37.2 C)] 98 F (36.7 C) (05/16 0555) Pulse Rate:  [86-99] 89 (05/16 0555) Resp:  [15-25] 15 (05/16 0555) BP: (106-131)/(54-70) 124/62 (05/16 0555) SpO2:  [93 %-100 %] 98 % (05/16 0555) Weight:  [83.1 kg] 83.1 kg (05/15 0829)  Intake/Output from previous day: 05/15 0701 - 05/16 0700 In: 850 [I.V.:450; IV Piggyback:400] Out: 50 [Blood:50] Intake/Output this shift: No intake/output data recorded.  Recent Labs    02/22/24 0627  HGB 9.9*   Recent Labs    02/22/24 0627  WBC 15.8*  RBC 3.47*  HCT 29.1*  PLT 361   Recent Labs    02/22/24 0627  NA 131*  K 3.9  CL 100  CO2 19*  BUN 40*  CREATININE 1.45*  GLUCOSE 256*  CALCIUM  9.1   No results for input(s): "LABPT", "INR" in the last 72 hours.  EXAM General - Patient is Alert, Appropriate, and Oriented Extremity - Neurovascular intact Sensation intact distally Intact pulses distally Dorsiflexion/Plantar flexion intact Dressing - dressing C/D/I and no drainage Motor Function - intact, moving foot and toes well on exam.   Past Medical History:  Diagnosis Date   Anemia    Arthritis    HIP-LEFT   Benign neoplasm of colon    Breast cancer (HCC) 2018   left breast   Cancer (HCC)    Dizziness    DM (diabetes mellitus), type 2 (HCC)    Genetic testing 01/10/2018   Multi-Cancer panel (83 genes) @ [facility] - No pathogenic mutations detected   GERD (gastroesophageal reflux disease)    H/O osteopenia    History of hiatal hernia    Hypertension    OAB (overactive bladder)    Personal history of chemotherapy    Personal history of radiation therapy    Sleep  apnea    cannot tolerate cpap   Trigger finger, acquired    Uterine cancer (HCC)     Assessment/Plan:   1 Day Post-Op Procedure(s) (LRB): ARTHROPLASTY, KNEE, TOTAL (Right) Principal Problem:   S/P total knee arthroplasty, right  Estimated body mass index is 38.29 kg/m as calculated from the following:   Height as of this encounter: 4\' 10"  (1.473 m).   Weight as of this encounter: 83.1 kg. Advance diet Up with therapy Pain well controlled VSS Labs stable, slight decrease in GFR with mild hyponatremia. Will give 500cc bolus IV fluids CM to assist with discharge to home with HHPT  DVT Prophylaxis - Lovenox, TED hose, and SCDs Weight-Bearing as tolerated to right leg   T. Thomos Flies, PA-C [facility] 02/22/2024, 7:47 AM

## 2024-02-22 NOTE — Progress Notes (Signed)
 Patient is not able to walk the distance required to go the bathroom, or she is unable to safely negotiate stairs required to access the bathroom.  A 3in1 BSC will alleviate this problem.       Lollie Marrow, PA-C [facility]

## 2024-05-22 ENCOUNTER — Ambulatory Visit
Admission: RE | Admit: 2024-05-22 | Discharge: 2024-05-22 | Disposition: A | Discharge status: Home / Self Care | Source: Ambulatory Visit | Attending: Oncology | Admitting: Oncology

## 2024-05-22 DIAGNOSIS — Z853 Personal history of malignant neoplasm of breast: Secondary | ICD-10-CM | POA: Insufficient documentation

## 2024-05-22 DIAGNOSIS — Z780 Asymptomatic menopausal state: Secondary | ICD-10-CM | POA: Insufficient documentation

## 2024-05-22 DIAGNOSIS — Z9012 Acquired absence of left breast and nipple: Secondary | ICD-10-CM | POA: Diagnosis not present

## 2024-05-22 DIAGNOSIS — M8589 Other specified disorders of bone density and structure, multiple sites: Secondary | ICD-10-CM | POA: Insufficient documentation

## 2024-05-22 DIAGNOSIS — Z1231 Encounter for screening mammogram for malignant neoplasm of breast: Secondary | ICD-10-CM | POA: Diagnosis not present

## 2024-05-22 DIAGNOSIS — Z170 Estrogen receptor positive status [ER+]: Secondary | ICD-10-CM | POA: Diagnosis present

## 2024-05-22 DIAGNOSIS — Z13820 Encounter for screening for osteoporosis: Secondary | ICD-10-CM | POA: Insufficient documentation

## 2024-05-22 DIAGNOSIS — C50512 Malignant neoplasm of lower-outer quadrant of left female breast: Secondary | ICD-10-CM

## 2024-06-23 ENCOUNTER — Inpatient Hospital Stay: Attending: Oncology

## 2024-06-23 DIAGNOSIS — Z8542 Personal history of malignant neoplasm of other parts of uterus: Secondary | ICD-10-CM | POA: Diagnosis not present

## 2024-06-23 DIAGNOSIS — Z90710 Acquired absence of both cervix and uterus: Secondary | ICD-10-CM | POA: Diagnosis not present

## 2024-06-23 DIAGNOSIS — Z1732 Human epidermal growth factor receptor 2 negative status: Secondary | ICD-10-CM | POA: Diagnosis not present

## 2024-06-23 DIAGNOSIS — Z803 Family history of malignant neoplasm of breast: Secondary | ICD-10-CM | POA: Diagnosis not present

## 2024-06-23 DIAGNOSIS — Z170 Estrogen receptor positive status [ER+]: Secondary | ICD-10-CM | POA: Diagnosis not present

## 2024-06-23 DIAGNOSIS — M8580 Other specified disorders of bone density and structure, unspecified site: Secondary | ICD-10-CM | POA: Insufficient documentation

## 2024-06-23 DIAGNOSIS — E119 Type 2 diabetes mellitus without complications: Secondary | ICD-10-CM | POA: Diagnosis not present

## 2024-06-23 DIAGNOSIS — Z79899 Other long term (current) drug therapy: Secondary | ICD-10-CM | POA: Insufficient documentation

## 2024-06-23 DIAGNOSIS — Z808 Family history of malignant neoplasm of other organs or systems: Secondary | ICD-10-CM | POA: Insufficient documentation

## 2024-06-23 DIAGNOSIS — Z794 Long term (current) use of insulin: Secondary | ICD-10-CM | POA: Diagnosis not present

## 2024-06-23 DIAGNOSIS — Z800 Family history of malignant neoplasm of digestive organs: Secondary | ICD-10-CM | POA: Insufficient documentation

## 2024-06-23 DIAGNOSIS — Z79811 Long term (current) use of aromatase inhibitors: Secondary | ICD-10-CM | POA: Diagnosis not present

## 2024-06-23 DIAGNOSIS — Z9221 Personal history of antineoplastic chemotherapy: Secondary | ICD-10-CM | POA: Insufficient documentation

## 2024-06-23 DIAGNOSIS — C50512 Malignant neoplasm of lower-outer quadrant of left female breast: Secondary | ICD-10-CM | POA: Diagnosis present

## 2024-06-23 DIAGNOSIS — Z923 Personal history of irradiation: Secondary | ICD-10-CM | POA: Diagnosis not present

## 2024-06-23 DIAGNOSIS — R2689 Other abnormalities of gait and mobility: Secondary | ICD-10-CM | POA: Insufficient documentation

## 2024-06-23 DIAGNOSIS — Z9012 Acquired absence of left breast and nipple: Secondary | ICD-10-CM | POA: Insufficient documentation

## 2024-06-23 DIAGNOSIS — I10 Essential (primary) hypertension: Secondary | ICD-10-CM | POA: Diagnosis not present

## 2024-06-23 DIAGNOSIS — Z1721 Progesterone receptor positive status: Secondary | ICD-10-CM | POA: Insufficient documentation

## 2024-06-23 DIAGNOSIS — Z7984 Long term (current) use of oral hypoglycemic drugs: Secondary | ICD-10-CM | POA: Insufficient documentation

## 2024-06-23 DIAGNOSIS — Z860101 Personal history of adenomatous and serrated colon polyps: Secondary | ICD-10-CM | POA: Diagnosis not present

## 2024-06-23 LAB — CMP (CANCER CENTER ONLY)
ALT: 12 U/L (ref 0–44)
AST: 18 U/L (ref 15–41)
Albumin: 4 g/dL (ref 3.5–5.0)
Alkaline Phosphatase: 74 U/L (ref 38–126)
Anion gap: 12 (ref 5–15)
BUN: 28 mg/dL — ABNORMAL HIGH (ref 8–23)
CO2: 22 mmol/L (ref 22–32)
Calcium: 10 mg/dL (ref 8.9–10.3)
Chloride: 98 mmol/L (ref 98–111)
Creatinine: 0.75 mg/dL (ref 0.44–1.00)
GFR, Estimated: >60 mL/min (ref >60)
Glucose, Bld: 172 mg/dL — ABNORMAL HIGH (ref 70–99)
Potassium: 3.8 mmol/L (ref 3.5–5.1)
Sodium: 132 mmol/L — ABNORMAL LOW (ref 135–145)
Total Bilirubin: 0.5 mg/dL (ref 0.0–1.2)
Total Protein: 7.1 g/dL (ref 6.5–8.1)

## 2024-06-23 LAB — CBC WITH DIFFERENTIAL (CANCER CENTER ONLY)
Abs Immature Granulocytes: 0.09 K/uL — ABNORMAL HIGH (ref 0.00–0.07)
Basophils Absolute: 0.1 K/uL (ref 0.0–0.1)
Basophils Relative: 1 %
Eosinophils Absolute: 0.3 K/uL (ref 0.0–0.5)
Eosinophils Relative: 3 %
HCT: 36.3 % (ref 36.0–46.0)
Hemoglobin: 12.3 g/dL (ref 12.0–15.0)
Immature Granulocytes: 1 %
Lymphocytes Relative: 15 %
Lymphs Abs: 1.5 K/uL (ref 0.7–4.0)
MCH: 28.9 pg (ref 26.0–34.0)
MCHC: 33.9 g/dL (ref 30.0–36.0)
MCV: 85.2 fL (ref 80.0–100.0)
Monocytes Absolute: 0.9 K/uL (ref 0.1–1.0)
Monocytes Relative: 9 %
Neutro Abs: 7.2 K/uL (ref 1.7–7.7)
Neutrophils Relative %: 71 %
Platelet Count: 375 K/uL (ref 150–400)
RBC: 4.26 MIL/uL (ref 3.87–5.11)
RDW: 12.7 % (ref 11.5–15.5)
WBC Count: 10.1 K/uL (ref 4.0–10.5)
nRBC: 0 % (ref 0.0–0.2)

## 2024-06-24 LAB — CANCER ANTIGEN 27.29: CA 27.29: 31.4 U/mL (ref 0.0–38.6)

## 2024-06-24 LAB — CANCER ANTIGEN 15-3: CA 15-3: 26.4 U/mL — ABNORMAL HIGH (ref 0.0–25.0)

## 2024-06-26 ENCOUNTER — Inpatient Hospital Stay (HOSPITAL_BASED_OUTPATIENT_CLINIC_OR_DEPARTMENT_OTHER): Admitting: Oncology

## 2024-06-26 ENCOUNTER — Inpatient Hospital Stay

## 2024-06-26 ENCOUNTER — Encounter: Payer: Self-pay | Admitting: Oncology

## 2024-06-26 VITALS — BP 136/84 | HR 89 | Temp 98.6°F | Resp 18 | Wt 178.5 lb

## 2024-06-26 DIAGNOSIS — C50512 Malignant neoplasm of lower-outer quadrant of left female breast: Secondary | ICD-10-CM

## 2024-06-26 DIAGNOSIS — R2689 Other abnormalities of gait and mobility: Secondary | ICD-10-CM | POA: Insufficient documentation

## 2024-06-26 DIAGNOSIS — M8589 Other specified disorders of bone density and structure, multiple sites: Secondary | ICD-10-CM | POA: Diagnosis not present

## 2024-06-26 DIAGNOSIS — M8580 Other specified disorders of bone density and structure, unspecified site: Secondary | ICD-10-CM | POA: Diagnosis not present

## 2024-06-26 DIAGNOSIS — Z170 Estrogen receptor positive status [ER+]: Secondary | ICD-10-CM

## 2024-06-26 DIAGNOSIS — E860 Dehydration: Secondary | ICD-10-CM

## 2024-06-26 MED ORDER — ZOLEDRONIC ACID 4 MG/100ML IV SOLN
4.0000 mg | Freq: Once | INTRAVENOUS | Status: AC
  Administered 2024-06-26: 4 mg via INTRAVENOUS
  Filled 2024-06-26: qty 100

## 2024-06-26 MED ORDER — LETROZOLE 2.5 MG PO TABS: 2.5000 mg | ORAL_TABLET | Freq: Every day | ORAL | 1 refills | Status: AC

## 2024-06-26 NOTE — Assessment & Plan Note (Signed)
 Chronic for her.  Discussed about physical therapy evaluation.  She declined.

## 2024-06-26 NOTE — Assessment & Plan Note (Addendum)
 05/22/24 DEXA Osteopenia - Major osteoporotic fracture: 21.2% Proceed with zometa  today. Continue calcium  and vitamin D  supplementation.

## 2024-06-26 NOTE — Assessment & Plan Note (Addendum)
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 S/p left modified radical mastectomy with ALND, s/p radiation.  ypT2N1a M0, declined adjuvant clinical trial Pabociclib + letrozole , currently on adjuvant endocrinology treatment with AI  Clinically she is doing well.   Patient is on extended endocrine therapy beyond 5 years. Labs reviewed and discussed with patient. Continue letrozole  2.5mg  daily. Refills sent. Continue annual right breast mammogram -next due August 2026

## 2024-06-26 NOTE — Patient Instructions (Signed)

## 2024-06-26 NOTE — Progress Notes (Signed)
 Hematology/Oncology Progress note [phone] [phone]     Reason for visit Follow up for management of Stage IIA left breast cancer.   ASSESSMENT & PLAN:   Cancer Staging  Breast cancer [facility]) Staging form: Breast, AJCC 8th Edition - Pathologic stage from 11/09/2017: ypT2, ypN1a, cM0, G2, ER+, PR+, HER2- - Signed by Babara Call, MD on 11/09/2017  Malignant neoplasm of lower-outer quadrant of left breast of female, estrogen receptor positive (HCC) Staging form: Breast, AJCC 8th Edition - Clinical stage from 04/25/2017: Stage IIA (cT3(m), cN1(f), cM0, G2, ER+, PR+, HER2: Equivocal) - Signed by Babara Call, MD on 05/23/2017   Malignant neoplasm of lower-outer quadrant of left breast of female, estrogen receptor positive (HCC) # cT3cN1cMo left breast multifocal breast invasive mammary carcinoma, ER+, PR+ HER2 negative by FISH, s/p neoadjuvant chemotherapy with 4 cycles of ddAC,and 12 weekly taxol . S/p left modified radical mastectomy with ALND, s/p radiation.  ypT2N1a M0, declined adjuvant clinical trial Pabociclib + letrozole , currently on adjuvant endocrinology treatment with AI  Clinically she is doing well.   Patient is on extended endocrine therapy beyond 5 years. Labs reviewed and discussed with patient. Continue letrozole  2.5mg  daily. Refills sent. Continue annual right breast mammogram -next due August 2026    Osteopenia 05/22/24 DEXA Osteopenia - Major osteoporotic fracture: 21.2% Proceed with zometa  today. Continue calcium  and vitamin D  supplementation.   Balance problem Chronic for her.  Discussed about physical therapy evaluation.  She declined.   Orders Placed This Encounter  Procedures   CMP (Cancer Center only)    Standing Status:   Future    Expected Date:   12/24/2024    Expiration Date:   03/24/2025   CBC with Differential (Cancer Center Only)    Standing Status:   Future    Expected Date:   12/24/2024    Expiration Date:   03/24/2025   Cancer  antigen 27.29    Standing Status:   Future    Expected Date:   12/24/2024    Expiration Date:   03/24/2025   Cancer antigen 15-3    Standing Status:   Future    Expected Date:   12/24/2024    Expiration Date:   03/24/2025   Follow up 6months Lab prior to MD + Zometa  All questions were answered. The patient knows to call the clinic with any problems, questions or concerns.  Call Babara, MD, PhD [facility] Hematology Oncology 06/26/2024       Pertinent Oncology History Dominique Guzman 73 y.o. female presents for follow up of breast cancer. . Patient had a history a left breast mass biopsy followed by breast excisonal biopsy in 2015 with benign pathology.  She also has a history of Uterine cancer which was treated with RT with Dr.Chrystal.  She underwent routine screening mammogram on 04/17/2017 which recommended a possible mass on the left breast. Left diagnostic mammogram showed a highly suspicious left breast mass at 4 o'clock, 7 cm from the nipple and 1 o'clock,,  About 3 cm from the nipple. Enlarged left axillary lymph node suspicious for metastatic disease.  1 US  breast confirmed a left breast mass of 2.1cm, 4 o'clock, and 1.1 cm mass at 1 o'clock.The masses at 1 and 4 o'clock are separated by approximately 4 cm sonographically 2 Biopsy of the two mass and axillary lymph node revealed invasive carcinoma, grade 2. Both are ER,PR positive. HER2 equivocal, FISH pending. 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  3 05/14/2017 MRI breast  showed Two sites of biopsy proven malignancy seen in the left breast, which span up to 11.3 cm. There are intervening areas of non mass enhancement and a small suspicious enhancing mass. 4 Case was discussed on breast tumor board on 05/21/2017 and the panel agree with neoadjuvant ddAC to T followed by surgery. Clinically cT3N1M0, Stage IIA  # Genetic testing Testing did not reveal a pathogenic mutation in any of the genes analyzed.  A copy of the  genetic test report will be scanned into Epic under the Media tab.The genes analyzed were the 83 genes on [facility]'s Multi-Cancer panel (ALK, APC, ATM, AXIN2, BAP1, BARD1, BLM, BMPR1A, BRCA1, BRCA2, BRIP1, CASR, CDC73, CDH1, CDK4, CDKN1B, CDKN1C, CDKN2A, CEBPA, CHEK2, CTNNA1, DICER1, DIS3L2, EGFR, EPCAM, FH, FLCN, GATA2, GPC3, GREM1, HOXB13, HRAS, KIT, MAX, MEN1, MET, MITF, MLH1, MSH2, MSH3, MSH6, MUTYH, NBN, NF1, NF2, NTHL1, PALB2, PDGFRA, PHOX2B, PMS2, POLD1, POLE, POT1, PRKAR1A, PTCH1, PTEN, RAD50, RAD51C, RAD51D, RB1, RECQL4, RET, RUNX1, SDHA, SDHAF2, SDHB, SDHC, SDHD, SMAD4, SMARCA4, SMARCB1, SMARCE1, STK11, SUFU, TERC, TERT, TMEM127, TP53, TSC1, TSC2, VHL, WRN, WT1).   #Cancer treatment  Neoadjuvant chemotherapy 05/23/2017- 10/03/2017 patient completed s/p 4 cycles ddAC and weekly taxol  x 12, on 10/26/2017 patient underwent that surgery.  Left axillary sentinel lymph node was positive, so Dr. Claudene proceeded with left mastectomy and left axillary lymph node dissection. Pathology showed ypT2(m)  pN1a, ER/PR positive, HER2 FISH negative, grade 2, negative margin, invasive mammary carcinoma.  S/p adjuvant RT.  Declined NATALEE Trial Clinical trial for pabociclib and letrozole .   # May 2019 Started on Letrozole   # Mediport removed 09/18/2018  History of uterine cancer.  INTERVAL HISTORY Patient presents for follow up for history of breast cancer ypT2 yp N1, s/p neoadjuvant chemotherapy and surgery ad adjuvant RT.  Patient presents for 50-month follow-up. She feels well.  Patient has no new breast concerns.  She is accompanied by her husband.  She walks with a cane.  Has a balance difficulty. Tolerates letrozole  well.   Review of Systems  Constitutional:  Negative for appetite change, chills, fatigue and fever.  HENT:   Negative for hearing loss and voice change.   Eyes:  Negative for eye problems.  Respiratory:  Negative for chest tightness and cough.   Cardiovascular:  Negative for chest  pain.  Gastrointestinal:  Negative for abdominal distention, abdominal pain and blood in stool.  Endocrine: Positive for hot flashes.  Genitourinary:  Negative for difficulty urinating and frequency.   Musculoskeletal:  Positive for arthralgias.  Skin:  Negative for itching and rash.  Neurological:  Negative for extremity weakness.  Hematological:  Negative for adenopathy.  Psychiatric/Behavioral:  Negative for confusion.     MEDICAL HISTORY: Past Medical History:  Diagnosis Date   Anemia    Arthritis    HIP-LEFT   Benign neoplasm of colon    Breast cancer (HCC) 2018   left breast   Cancer (HCC)    Dizziness    DM (diabetes mellitus), type 2 (HCC)    Genetic testing 01/10/2018   Multi-Cancer panel (83 genes) @ [facility] - No pathogenic mutations detected   GERD (gastroesophageal reflux disease)    H/O osteopenia    History of hiatal hernia    Hypertension    OAB (overactive bladder)    Personal history of chemotherapy    Personal history of radiation therapy    Sleep apnea    cannot tolerate cpap   Trigger finger, acquired    Uterine cancer (HCC)  SURGICAL HISTORY: Past Surgical History:  Procedure Laterality Date   ABDOMINAL HYSTERECTOMY     BREAST BIOPSY Left 04/2014   negative   BREAST EXCISIONAL BIOPSY Left 2015   CESAREAN SECTION     X2   COLONOSCOPY WITH PROPOFOL  N/A 04/15/2015   Procedure: COLONOSCOPY WITH PROPOFOL ;  Surgeon: Deward CINDERELLA Piedmont, MD;  Location: [facility];  Service: Gastroenterology;  Laterality: N/A;   COLONOSCOPY WITH PROPOFOL  N/A 08/23/2020   Procedure: COLONOSCOPY WITH PROPOFOL ;  Surgeon: Toledo, Ladell POUR, MD;  Location: [facility];  Service: Gastroenterology;  Laterality: N/A;   HERNIA REPAIR     MASTECTOMY Left 10/2017   MASTECTOMY MODIFIED RADICAL Left 10/26/2017   Procedure: MASTECTOMY MODIFIED RADICAL;  Surgeon: Claudene Larinda Bolder, MD;  Location: [facility];  Service: General;  Laterality: Left;   MASTECTOMY W/ SENTINEL NODE  BIOPSY Left 10/26/2017   Procedure: MASTECTOMY WITH SENTINEL LYMPH NODE BIOPSY;  Surgeon: Claudene Larinda Bolder, MD;  Location: [facility];  Service: General;  Laterality: Left;   PORT-A-CATH REMOVAL     PORTACATH PLACEMENT Right 05/22/2017   Procedure: INSERTION PORT-A-CATH;  Surgeon: Claudene Larinda Bolder, MD;  Location: [facility];  Service: General;  Laterality: Right;   TOTAL KNEE ARTHROPLASTY Right 02/21/2024   Procedure: ARTHROPLASTY, KNEE, TOTAL;  Surgeon: Lorelle Hussar, MD;  Location: [facility];  Service: Orthopedics;  Laterality: Right;    SOCIAL HISTORY: Social History   Socioeconomic History   Marital status: Married    Spouse name: Not on file   Number of children: Not on file   Years of education: Not on file   Highest education level: Not on file  Occupational History   Not on file  Tobacco Use   Smoking status: Never   Smokeless tobacco: Never  Vaping Use   Vaping status: Never Used  Substance and Sexual Activity   Alcohol use: No   Drug use: No   Sexual activity: Not on file  Other Topics Concern   Not on file  Social History Narrative   Not on file   Social Drivers of Health   Financial Resource Strain: Low Risk  (04/04/2024)   Received from [facility]   Overall Financial Resource Strain (CARDIA)    Difficulty of Paying Living Expenses: Not hard at all  Food Insecurity: No Food Insecurity (04/04/2024)   Received from [facility]   Hunger Vital Sign    Within the past 12 months, you worried that your food would run out before you got the money to buy more.: Never true    Within the past 12 months, the food you bought just didn't last and you didn't have money to get more.: Never true  Transportation Needs: No Transportation Needs (04/04/2024)   Received from [facility] - Transportation    In the past 12 months, has lack of transportation kept you from medical appointments or from getting  medications?: No    Lack of Transportation (Non-Medical): No  Physical Activity: Not on file  Stress: Not on file  Social Connections: Moderately Integrated (02/21/2024)   Social Connection and Isolation Panel    Frequency of Communication with Friends and Family: Three times a week    Frequency of Social Gatherings with Friends and Family: Once a week    Attends Religious Services: 1 to 4 times per year    Active Member of [employer] or Organizations: No    Attends Banker Meetings: Never  Marital Status: Married  Catering manager Violence: Not At Risk (02/21/2024)   Humiliation, Afraid, Rape, and Kick questionnaire    Fear of Current or Ex-Partner: No    Emotionally Abused: No    Physically Abused: No    Sexually Abused: No    FAMILY HISTORY Family History  Problem Relation Age of Onset   Diabetes Mother    Diabetes Father    Melanoma Father 47       on finger   Diabetes Sister    Pancreatic cancer Paternal Grandfather        deceased 75s   Breast cancer Other 60       paternal grandmother's mother    ALLERGIES:  is allergic to crestor [rosuvastatin].  MEDICATIONS:  Current Outpatient Medications  Medication Sig Dispense Refill   acetaminophen  (TYLENOL ) 500 MG tablet Take 2 tablets (1,000 mg total) by mouth every 8 (eight) hours. 30 tablet 0   cholecalciferol (VITAMIN D3) 25 MCG (1000 UNIT) tablet Take 1,000 Units by mouth daily.     docusate sodium  (COLACE) 100 MG capsule Take 1 capsule (100 mg total) by mouth 2 (two) times daily. 10 capsule 0   enoxaparin  (LOVENOX ) 40 MG/0.4ML injection Inject 0.4 mLs (40 mg total) into the skin daily for 14 days. 5.6 mL 0   glipiZIDE (GLUCOTROL XL) 10 MG 24 hr tablet Take 10 mg by mouth daily.     Insulin  Glargine (BASAGLAR KWIKPEN) 100 UNIT/ML Inject 12 Units into the skin every evening.     ipratropium (ATROVENT ) 0.03 % nasal spray Place 2 sprays into both nostrils daily as needed for rhinitis.      lisinopril -hydrochlorothiazide  (PRINZIDE ,ZESTORETIC ) 20-12.5 MG per tablet Take 1 tablet by mouth every morning.     metFORMIN  (GLUCOPHAGE ) 500 MG tablet Take 1,000 mg by mouth 2 (two) times daily.      Multiple Vitamin (MULTIVITAMIN) tablet Take 1 tablet by mouth daily.     ondansetron  (ZOFRAN ) 4 MG tablet Take 1 tablet (4 mg total) by mouth every 6 (six) hours as needed for nausea. 20 tablet 0   oxyCODONE  (ROXICODONE ) 5 MG immediate release tablet Take 0.5-1 tablets (2.5-5 mg total) by mouth every 8 (eight) hours as needed for breakthrough pain. 15 tablet 0   pioglitazone (ACTOS) 15 MG tablet Take 15 mg by mouth daily.     traMADol  (ULTRAM ) 50 MG tablet Take 1 tablet (50 mg total) by mouth every 6 (six) hours as needed for moderate pain (pain score 4-6). 30 tablet 0   trospium (SANCTURA) 20 MG tablet Take 20 mg by mouth daily after lunch.     letrozole  (FEMARA ) 2.5 MG tablet Take 1 tablet (2.5 mg total) by mouth daily. 90 tablet 1   No current facility-administered medications for this visit.    PHYSICAL EXAMINATION:  ECOG PERFORMANCE STATUS: 1 - Symptomatic but completely ambulatory  Vitals:   06/26/24 1334  BP: 136/84  Pulse: 89  Resp: 18  Temp: 98.6 F (37 C)  SpO2: 100%    Filed Weights   06/26/24 1334  Weight: 178 lb 8 oz (81 kg)     Physical Exam Constitutional:      General: She is not in acute distress.    Appearance: She is obese. She is not diaphoretic.     Comments: Patient sits in wheelchair.     HENT:     Head: Normocephalic and atraumatic.  Eyes:     General: No scleral icterus. Cardiovascular:  Rate and Rhythm: Normal rate and regular rhythm.     Heart sounds: No murmur heard. Pulmonary:     Effort: Pulmonary effort is normal. No respiratory distress.     Breath sounds: No wheezing.  Abdominal:     General: There is no distension.     Palpations: Abdomen is soft.  Musculoskeletal:        General: Normal range of motion.     Cervical back:  Normal range of motion and neck supple.  Skin:    General: Skin is warm and dry.     Findings: No erythema.  Neurological:     Mental Status: She is alert and oriented to person, place, and time. Mental status is at baseline.     Motor: No abnormal muscle tone.  Psychiatric:        Mood and Affect: Mood and affect normal.   History of left mastectomy.  No palpable chest wall mass. Radiation-induced telangiectasias.    LABORATORY DATA:    Latest Ref Rng & Units 06/23/2024   11:31 AM 02/22/2024    6:27 AM 12/21/2023   11:43 AM  CBC  WBC 4.0 - 10.5 K/uL 10.1  15.8  10.8   Hemoglobin 12.0 - 15.0 g/dL 87.6  9.9  88.1   Hematocrit 36.0 - 46.0 % 36.3  29.1  34.9   Platelets 150 - 400 K/uL 375  361  400       Latest Ref Rng & Units 06/23/2024   11:31 AM 02/22/2024    6:27 AM 02/14/2024   11:44 AM  CMP  Glucose 70 - 99 mg/dL 827  743  878   BUN 8 - 23 mg/dL 28  40  25   Creatinine 0.44 - 1.00 mg/dL 9.24  8.54  8.89   Sodium 135 - 145 mmol/L 132  131  135   Potassium 3.5 - 5.1 mmol/L 3.8  3.9  3.6   Chloride 98 - 111 mmol/L 98  100  100   CO2 22 - 32 mmol/L 22  19  24    Calcium  8.9 - 10.3 mg/dL 89.9  9.1  89.7   Total Protein 6.5 - 8.1 g/dL 7.1   7.7   Total Bilirubin 0.0 - 1.2 mg/dL 0.5   0.7   Alkaline Phos 38 - 126 U/L 74   62   AST 15 - 41 U/L 18   20   ALT 0 - 44 U/L 12   16    RADIOGRAPHIC STUDIES: I have personally reviewed the radiological images as listed and agreed with the findings in the report. DG Bone Density Result Date: 05/23/2024 EXAM: DUAL X-RAY ABSORPTIOMETRY (DXA) FOR BONE MINERAL DENSITY 05/22/2024 11:20 am CLINICAL DATA:  73 year old Female Postmenopausal. Breast cancer TECHNIQUE: An axial (e.g., hips, spine) and/or appendicular (e.g., radius) exam was performed, as appropriate, using GE Secretary/administrator at [employer] [facility]. Images are obtained for bone mineral density measurement and are not obtained for diagnostic purposes. [id number]  Exclusions: L4. COMPARISON:  None. New baseline. FINDINGS: Scan quality: Good. LUMBAR SPINE (L1-L3): BMD (in g/cm2): 1.050 T-score: -1.0 Z-score: 0.7 LEFT FEMORAL NECK: BMD (in g/cm2): 0.978 T-score: -0.4 Z-score: 1.3 LEFT TOTAL HIP: BMD (in g/cm2): 1.057 T-score: 0.4 Z-score: 1.9 RIGHT FEMORAL NECK: BMD (in g/cm2): 0.856 T-score: -1.3 Z-score: 0.4 RIGHT TOTAL HIP: BMD (in g/cm2): 0.958 T-score: -0.4 Z-score: 1.1 LEFT FOREARM (RADIUS 33%): BMD (in g/cm2): 0.884 T-score: 0.1 Z-score: 2.0 FRAX 10-YEAR PROBABILITY  OF FRACTURE: 10-year fracture risk is performed using the [facility] calculator based on patient-reported risk factors. Major osteoporotic fracture: 21.2% Hip fracture: 5.1% Other situations known to alter the reliability of the FRAX score should be considered when making treatment decisions, including chronic glucocorticoid use and past treatments. Further guidance on treatment can be found at the [facility]oporosis Foundation's website [email or web address]://www.patton.com/. IMPRESSION: Osteopenia based on BMD. Fracture risk is increased. RECOMMENDATIONS: 1. All patients should optimize calcium  and vitamin D  intake. 2. Consider FDA-approved medical therapies in postmenopausal women and men aged 16 years and older, based on the following: - A hip or vertebral (clinical or morphometric) fracture - T-score less than or equal to -2.5 and secondary causes have been excluded. - Low bone mass (T-score between -1.0 and -2.5) and a 10-year probability of a hip fracture greater than or equal to 3% or a 10-year probability of a major osteoporosis-related fracture greater than or equal to 20% based on the [place] -adapted WHO algorithm. - Clinician judgment and/or patient preferences may indicate treatment for people with 10-year fracture probabilities above or below these levels 3. Patients with diagnosis of osteoporosis or at high risk for fracture should have regular bone mineral density tests. For patients eligible for  Medicare, routine testing is allowed once every 2 years. The testing frequency can be increased to one year for patients who have rapidly progressing disease, those who are receiving or discontinuing medical therapy to restore bone mass, or have additional risk factors. Electronically Signed   By: Reyes Phi M.D.   On: 05/23/2024 15:28   MM 3D SCREENING MAMMOGRAM UNILATERAL RIGHT BREAST Result Date: 05/23/2024 CLINICAL DATA:  Screening. EXAM: DIGITAL SCREENING UNILATERAL RIGHT MAMMOGRAM WITH CAD AND TOMOSYNTHESIS TECHNIQUE: Right screening digital craniocaudal and mediolateral oblique mammograms were obtained. Right screening digital breast tomosynthesis was performed. The images were evaluated with computer-aided detection. COMPARISON:  Previous exam(s). ACR Breast Density Category b: There are scattered areas of fibroglandular density. FINDINGS: The patient has had a left mastectomy. There are no findings suspicious for malignancy. IMPRESSION: No mammographic evidence of malignancy. A result letter of this screening mammogram will be mailed directly to the patient. RECOMMENDATION: Screening mammogram in one year.  (Code:SM-R-46M) BI-RADS CATEGORY  1: Negative. Electronically Signed   By: Dina  Arceo M.D.   On: 05/23/2024 14:37   .

## 2024-12-22 ENCOUNTER — Other Ambulatory Visit

## 2024-12-25 ENCOUNTER — Ambulatory Visit

## 2024-12-25 ENCOUNTER — Ambulatory Visit: Admitting: Oncology
